# Patient Record
Sex: Female | Born: 1950
Health system: Southern US, Community
[De-identification: ages and names within clinical notes are randomized; demographics above are authoritative.]

## PROBLEM LIST (undated history)

## (undated) DIAGNOSIS — R590 Localized enlarged lymph nodes: Secondary | ICD-10-CM

## (undated) DIAGNOSIS — K760 Fatty (change of) liver, not elsewhere classified: Secondary | ICD-10-CM

## (undated) DIAGNOSIS — J449 Chronic obstructive pulmonary disease, unspecified: Secondary | ICD-10-CM

## (undated) DIAGNOSIS — R0789 Other chest pain: Secondary | ICD-10-CM

## (undated) DIAGNOSIS — IMO0002 Reserved for concepts with insufficient information to code with codable children: Secondary | ICD-10-CM

## (undated) DIAGNOSIS — Z87898 Personal history of other specified conditions: Secondary | ICD-10-CM

## (undated) DIAGNOSIS — G4733 Obstructive sleep apnea (adult) (pediatric): Secondary | ICD-10-CM

## (undated) DIAGNOSIS — Z86718 Personal history of other venous thrombosis and embolism: Secondary | ICD-10-CM

## (undated) DIAGNOSIS — C801 Malignant (primary) neoplasm, unspecified: Secondary | ICD-10-CM

## (undated) DIAGNOSIS — E039 Hypothyroidism, unspecified: Secondary | ICD-10-CM

## (undated) DIAGNOSIS — F32A Depression, unspecified: Secondary | ICD-10-CM

## (undated) DIAGNOSIS — Z8701 Personal history of pneumonia (recurrent): Secondary | ICD-10-CM

## (undated) DIAGNOSIS — M8580 Other specified disorders of bone density and structure, unspecified site: Secondary | ICD-10-CM

## (undated) DIAGNOSIS — M1990 Unspecified osteoarthritis, unspecified site: Secondary | ICD-10-CM

## (undated) DIAGNOSIS — F419 Anxiety disorder, unspecified: Secondary | ICD-10-CM

## (undated) DIAGNOSIS — E119 Type 2 diabetes mellitus without complications: Secondary | ICD-10-CM

## (undated) DIAGNOSIS — E669 Obesity, unspecified: Secondary | ICD-10-CM

## (undated) DIAGNOSIS — L03313 Cellulitis of chest wall: Secondary | ICD-10-CM

## (undated) DIAGNOSIS — I739 Peripheral vascular disease, unspecified: Secondary | ICD-10-CM

## (undated) DIAGNOSIS — Z720 Tobacco use: Secondary | ICD-10-CM

## (undated) DIAGNOSIS — G629 Polyneuropathy, unspecified: Secondary | ICD-10-CM

## (undated) DIAGNOSIS — Z8615 Personal history of latent tuberculosis infection: Secondary | ICD-10-CM

## (undated) DIAGNOSIS — Z1211 Encounter for screening for malignant neoplasm of colon: Secondary | ICD-10-CM

## (undated) DIAGNOSIS — M797 Fibromyalgia: Secondary | ICD-10-CM

## (undated) DIAGNOSIS — D179 Benign lipomatous neoplasm, unspecified: Secondary | ICD-10-CM

## (undated) DIAGNOSIS — E785 Hyperlipidemia, unspecified: Secondary | ICD-10-CM

## (undated) DIAGNOSIS — K1120 Sialoadenitis, unspecified: Secondary | ICD-10-CM

## (undated) DIAGNOSIS — I10 Essential (primary) hypertension: Secondary | ICD-10-CM

## (undated) DIAGNOSIS — E1142 Type 2 diabetes mellitus with diabetic polyneuropathy: Secondary | ICD-10-CM

## (undated) DIAGNOSIS — K219 Gastro-esophageal reflux disease without esophagitis: Secondary | ICD-10-CM

## (undated) DIAGNOSIS — J339 Nasal polyp, unspecified: Secondary | ICD-10-CM

## (undated) DIAGNOSIS — Z9989 Dependence on other enabling machines and devices: Secondary | ICD-10-CM

## (undated) DIAGNOSIS — F329 Major depressive disorder, single episode, unspecified: Secondary | ICD-10-CM

## (undated) DIAGNOSIS — Z853 Personal history of malignant neoplasm of breast: Secondary | ICD-10-CM

## (undated) HISTORY — DX: Anxiety disorder, unspecified: F41.9

## (undated) HISTORY — DX: Encounter for screening for malignant neoplasm of colon: Z12.11

## (undated) HISTORY — DX: Type 2 diabetes mellitus without complications: E11.9

## (undated) HISTORY — DX: Nasal polyp, unspecified: J33.9

## (undated) HISTORY — DX: Reserved for concepts with insufficient information to code with codable children: IMO0002

## (undated) HISTORY — DX: Dependence on other enabling machines and devices: Z99.89

## (undated) HISTORY — DX: Type 2 diabetes mellitus with diabetic polyneuropathy: E11.42

## (undated) HISTORY — DX: Personal history of other venous thrombosis and embolism: Z86.718

## (undated) HISTORY — DX: Essential (primary) hypertension: I10

## (undated) HISTORY — DX: Depression, unspecified: F32.A

## (undated) HISTORY — PX: PILONIDAL CYST EXCISION: SHX744

## (undated) HISTORY — DX: Obstructive sleep apnea (adult) (pediatric): G47.33

## (undated) HISTORY — DX: Personal history of latent tuberculosis infection: Z86.15

## (undated) HISTORY — PX: CARPAL TUNNEL RELEASE: SHX101

## (undated) HISTORY — DX: Hyperlipidemia, unspecified: E78.5

## (undated) HISTORY — DX: Obesity, unspecified: E66.9

## (undated) HISTORY — PX: THYROIDECTOMY, PARTIAL: SHX18

## (undated) HISTORY — DX: Major depressive disorder, single episode, unspecified: F32.9

## (undated) HISTORY — DX: Fibromyalgia: M79.7

## (undated) HISTORY — DX: Localized enlarged lymph nodes: R59.0

## (undated) HISTORY — DX: Hypothyroidism, unspecified: E03.9

## (undated) HISTORY — DX: Unspecified osteoarthritis, unspecified site: M19.90

## (undated) HISTORY — DX: Personal history of malignant neoplasm of breast: Z85.3

## (undated) HISTORY — DX: Tobacco use: Z72.0

## (undated) HISTORY — DX: Personal history of other specified conditions: Z87.898

## (undated) HISTORY — DX: Personal history of pneumonia (recurrent): Z87.01

## (undated) HISTORY — DX: Other specified disorders of bone density and structure, unspecified site: M85.80

## (undated) HISTORY — PX: TOTAL KNEE ARTHROPLASTY: SHX125

## (undated) HISTORY — DX: Benign lipomatous neoplasm, unspecified: D17.9

## (undated) HISTORY — DX: Other chest pain: R07.89

## (undated) HISTORY — DX: Fatty (change of) liver, not elsewhere classified: K76.0

## (undated) HISTORY — DX: Gastro-esophageal reflux disease without esophagitis: K21.9

## (undated) HISTORY — DX: Sialoadenitis, unspecified: K11.20

## (undated) HISTORY — DX: Polyneuropathy, unspecified: G62.9

---
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
## 1989-05-07 HISTORY — PX: ABDOMINAL HYSTERECTOMY: SHX81

## 1989-05-07 HISTORY — PX: APPENDECTOMY: SHX54

## 1997-08-23 ENCOUNTER — Other Ambulatory Visit: Admission: RE | Admit: 1997-08-23 | Discharge: 1997-08-23 | Payer: Self-pay | Admitting: Gynecology

## 1999-09-20 ENCOUNTER — Emergency Department (HOSPITAL_COMMUNITY): Admission: EM | Admit: 1999-09-20 | Discharge: 1999-09-20 | Payer: Self-pay | Admitting: Emergency Medicine

## 1999-09-20 ENCOUNTER — Encounter: Payer: Self-pay | Admitting: Emergency Medicine

## 2002-05-07 DIAGNOSIS — I739 Peripheral vascular disease, unspecified: Secondary | ICD-10-CM

## 2002-05-07 DIAGNOSIS — Z853 Personal history of malignant neoplasm of breast: Secondary | ICD-10-CM

## 2002-05-07 DIAGNOSIS — Z86718 Personal history of other venous thrombosis and embolism: Secondary | ICD-10-CM

## 2002-05-07 HISTORY — PX: BREAST SURGERY: SHX581

## 2002-05-07 HISTORY — DX: Personal history of malignant neoplasm of breast: Z85.3

## 2002-05-07 HISTORY — DX: Personal history of other venous thrombosis and embolism: Z86.718

## 2002-05-07 HISTORY — DX: Peripheral vascular disease, unspecified: I73.9

## 2002-05-07 HISTORY — PX: BREAST LUMPECTOMY: SHX2

## 2002-07-15 ENCOUNTER — Other Ambulatory Visit: Admission: RE | Admit: 2002-07-15 | Discharge: 2002-07-15 | Payer: Self-pay | Admitting: Diagnostic Radiology

## 2002-07-15 ENCOUNTER — Encounter (INDEPENDENT_AMBULATORY_CARE_PROVIDER_SITE_OTHER): Payer: Self-pay | Admitting: *Deleted

## 2002-07-15 ENCOUNTER — Encounter: Payer: Self-pay | Admitting: General Surgery

## 2002-07-15 ENCOUNTER — Encounter: Admission: RE | Admit: 2002-07-15 | Discharge: 2002-07-15 | Payer: Self-pay | Admitting: General Surgery

## 2002-07-21 ENCOUNTER — Encounter (HOSPITAL_COMMUNITY): Admission: RE | Admit: 2002-07-21 | Discharge: 2002-10-19 | Payer: Self-pay | Admitting: General Surgery

## 2002-07-21 ENCOUNTER — Encounter: Payer: Self-pay | Admitting: General Surgery

## 2002-07-22 ENCOUNTER — Encounter: Payer: Self-pay | Admitting: General Surgery

## 2002-07-23 ENCOUNTER — Encounter: Admission: RE | Admit: 2002-07-23 | Discharge: 2002-07-23 | Payer: Self-pay | Admitting: General Surgery

## 2002-07-23 ENCOUNTER — Encounter: Payer: Self-pay | Admitting: General Surgery

## 2002-07-23 ENCOUNTER — Encounter (INDEPENDENT_AMBULATORY_CARE_PROVIDER_SITE_OTHER): Payer: Self-pay | Admitting: *Deleted

## 2002-07-28 ENCOUNTER — Encounter: Payer: Self-pay | Admitting: General Surgery

## 2002-07-28 ENCOUNTER — Ambulatory Visit (HOSPITAL_BASED_OUTPATIENT_CLINIC_OR_DEPARTMENT_OTHER): Admission: RE | Admit: 2002-07-28 | Discharge: 2002-07-28 | Payer: Self-pay | Admitting: General Surgery

## 2002-07-28 ENCOUNTER — Encounter (INDEPENDENT_AMBULATORY_CARE_PROVIDER_SITE_OTHER): Payer: Self-pay | Admitting: *Deleted

## 2002-07-28 ENCOUNTER — Encounter: Admission: RE | Admit: 2002-07-28 | Discharge: 2002-07-28 | Payer: Self-pay | Admitting: General Surgery

## 2002-08-11 ENCOUNTER — Ambulatory Visit: Admission: RE | Admit: 2002-08-11 | Discharge: 2002-10-21 | Payer: Self-pay | Admitting: Radiation Oncology

## 2002-08-13 ENCOUNTER — Encounter: Payer: Self-pay | Admitting: Geriatric Medicine

## 2002-08-13 ENCOUNTER — Encounter: Admission: RE | Admit: 2002-08-13 | Discharge: 2002-08-13 | Payer: Self-pay | Admitting: Geriatric Medicine

## 2002-09-07 ENCOUNTER — Encounter: Admission: RE | Admit: 2002-09-07 | Discharge: 2002-09-07 | Payer: Self-pay | Admitting: Geriatric Medicine

## 2002-09-07 ENCOUNTER — Encounter: Payer: Self-pay | Admitting: Geriatric Medicine

## 2002-09-24 ENCOUNTER — Encounter: Payer: Self-pay | Admitting: Geriatric Medicine

## 2002-09-24 ENCOUNTER — Encounter: Admission: RE | Admit: 2002-09-24 | Discharge: 2002-09-24 | Payer: Self-pay | Admitting: Geriatric Medicine

## 2002-12-28 ENCOUNTER — Encounter: Admission: RE | Admit: 2002-12-28 | Discharge: 2002-12-28 | Payer: Self-pay | Admitting: Geriatric Medicine

## 2002-12-28 ENCOUNTER — Encounter: Payer: Self-pay | Admitting: Geriatric Medicine

## 2003-01-06 HISTORY — PX: BILATERAL OOPHORECTOMY: SHX1221

## 2003-01-14 ENCOUNTER — Encounter: Payer: Self-pay | Admitting: Oncology

## 2003-01-14 ENCOUNTER — Encounter: Admission: RE | Admit: 2003-01-14 | Discharge: 2003-01-14 | Payer: Self-pay | Admitting: Oncology

## 2003-01-21 ENCOUNTER — Encounter (HOSPITAL_COMMUNITY): Admission: RE | Admit: 2003-01-21 | Discharge: 2003-04-21 | Payer: Self-pay | Admitting: Oncology

## 2003-01-21 ENCOUNTER — Encounter: Payer: Self-pay | Admitting: Oncology

## 2003-01-22 ENCOUNTER — Encounter: Payer: Self-pay | Admitting: Oncology

## 2003-01-26 ENCOUNTER — Ambulatory Visit (HOSPITAL_COMMUNITY): Admission: RE | Admit: 2003-01-26 | Discharge: 2003-01-26 | Payer: Self-pay | Admitting: Geriatric Medicine

## 2003-02-18 ENCOUNTER — Encounter (INDEPENDENT_AMBULATORY_CARE_PROVIDER_SITE_OTHER): Payer: Self-pay

## 2003-02-18 ENCOUNTER — Ambulatory Visit (HOSPITAL_COMMUNITY): Admission: RE | Admit: 2003-02-18 | Discharge: 2003-02-18 | Payer: Self-pay | Admitting: Obstetrics and Gynecology

## 2003-03-22 ENCOUNTER — Ambulatory Visit (HOSPITAL_BASED_OUTPATIENT_CLINIC_OR_DEPARTMENT_OTHER): Admission: RE | Admit: 2003-03-22 | Discharge: 2003-03-22 | Payer: Self-pay | Admitting: Geriatric Medicine

## 2003-04-27 ENCOUNTER — Ambulatory Visit: Admission: RE | Admit: 2003-04-27 | Discharge: 2003-04-27 | Payer: Self-pay | Admitting: Radiation Oncology

## 2003-05-11 ENCOUNTER — Ambulatory Visit: Admission: RE | Admit: 2003-05-11 | Discharge: 2003-05-11 | Payer: Self-pay | Admitting: Radiation Oncology

## 2003-08-11 ENCOUNTER — Ambulatory Visit (HOSPITAL_COMMUNITY): Admission: RE | Admit: 2003-08-11 | Discharge: 2003-08-11 | Payer: Self-pay | Admitting: Geriatric Medicine

## 2004-01-03 ENCOUNTER — Encounter: Admission: RE | Admit: 2004-01-03 | Discharge: 2004-01-03 | Payer: Self-pay | Admitting: Geriatric Medicine

## 2004-01-19 ENCOUNTER — Encounter: Admission: RE | Admit: 2004-01-19 | Discharge: 2004-01-19 | Payer: Self-pay | Admitting: General Surgery

## 2004-01-26 ENCOUNTER — Encounter (HOSPITAL_COMMUNITY): Admission: RE | Admit: 2004-01-26 | Discharge: 2004-02-02 | Payer: Self-pay | Admitting: Oncology

## 2004-06-12 ENCOUNTER — Encounter: Admission: RE | Admit: 2004-06-12 | Discharge: 2004-06-12 | Payer: Self-pay | Admitting: Endocrinology

## 2004-07-14 ENCOUNTER — Ambulatory Visit: Payer: Self-pay | Admitting: Oncology

## 2004-08-29 ENCOUNTER — Ambulatory Visit: Payer: Self-pay | Admitting: Oncology

## 2004-11-14 ENCOUNTER — Ambulatory Visit: Payer: Self-pay | Admitting: Oncology

## 2005-01-24 ENCOUNTER — Ambulatory Visit (HOSPITAL_COMMUNITY): Admission: RE | Admit: 2005-01-24 | Discharge: 2005-01-25 | Payer: Self-pay | Admitting: Otolaryngology

## 2005-02-14 ENCOUNTER — Encounter: Admission: RE | Admit: 2005-02-14 | Discharge: 2005-02-14 | Payer: Self-pay | Admitting: General Surgery

## 2005-02-20 ENCOUNTER — Encounter: Admission: RE | Admit: 2005-02-20 | Discharge: 2005-02-20 | Payer: Self-pay | Admitting: Geriatric Medicine

## 2005-02-27 ENCOUNTER — Ambulatory Visit: Payer: Self-pay | Admitting: Oncology

## 2005-03-07 ENCOUNTER — Encounter: Admission: RE | Admit: 2005-03-07 | Discharge: 2005-03-07 | Payer: Self-pay | Admitting: General Surgery

## 2005-03-08 ENCOUNTER — Encounter: Admission: RE | Admit: 2005-03-08 | Discharge: 2005-03-08 | Payer: Self-pay | Admitting: Gastroenterology

## 2005-06-08 ENCOUNTER — Ambulatory Visit (HOSPITAL_COMMUNITY): Admission: RE | Admit: 2005-06-08 | Discharge: 2005-06-08 | Payer: Self-pay | Admitting: Orthopedic Surgery

## 2005-08-28 ENCOUNTER — Ambulatory Visit: Payer: Self-pay | Admitting: Oncology

## 2005-09-07 ENCOUNTER — Ambulatory Visit: Payer: Self-pay | Admitting: Internal Medicine

## 2005-09-18 ENCOUNTER — Ambulatory Visit: Payer: Self-pay | Admitting: Internal Medicine

## 2005-10-02 ENCOUNTER — Ambulatory Visit: Payer: Self-pay | Admitting: Internal Medicine

## 2005-10-15 ENCOUNTER — Ambulatory Visit: Payer: Self-pay | Admitting: Internal Medicine

## 2005-10-15 HISTORY — PX: COLONOSCOPY: SHX174

## 2006-03-18 ENCOUNTER — Encounter: Admission: RE | Admit: 2006-03-18 | Discharge: 2006-03-18 | Payer: Self-pay | Admitting: Family Medicine

## 2006-05-07 HISTORY — PX: NASAL SEPTUM SURGERY: SHX37

## 2006-05-08 ENCOUNTER — Ambulatory Visit: Payer: Self-pay | Admitting: Oncology

## 2006-06-04 LAB — COMPREHENSIVE METABOLIC PANEL
Alkaline Phosphatase: 123 U/L — ABNORMAL HIGH (ref 39–117)
BUN: 20 mg/dL (ref 6–23)
CO2: 31 mEq/L (ref 19–32)
Calcium: 9.9 mg/dL (ref 8.4–10.5)
Chloride: 98 mEq/L (ref 96–112)
Glucose, Bld: 90 mg/dL (ref 70–99)
Total Bilirubin: 0.5 mg/dL (ref 0.3–1.2)

## 2006-06-04 LAB — CBC WITH DIFFERENTIAL/PLATELET
BASO%: 0.3 % (ref 0.0–2.0)
Basophils Absolute: 0 10*3/uL (ref 0.0–0.1)
EOS%: 1.5 % (ref 0.0–7.0)
HGB: 14 g/dL (ref 11.6–15.9)
LYMPH%: 34.8 % (ref 14.0–48.0)
MCHC: 34.3 g/dL (ref 32.0–36.0)
MONO#: 0.4 10*3/uL (ref 0.1–0.9)
MONO%: 7.7 % (ref 0.0–13.0)
NEUT%: 55.7 % (ref 39.6–76.8)
WBC: 5.5 10*3/uL (ref 3.9–10.0)

## 2006-06-25 ENCOUNTER — Encounter: Admission: RE | Admit: 2006-06-25 | Discharge: 2006-06-25 | Payer: Self-pay | Admitting: Family Medicine

## 2006-07-04 ENCOUNTER — Inpatient Hospital Stay (HOSPITAL_COMMUNITY): Admission: RE | Admit: 2006-07-04 | Discharge: 2006-07-07 | Payer: Self-pay | Admitting: Orthopedic Surgery

## 2006-09-03 ENCOUNTER — Ambulatory Visit (HOSPITAL_COMMUNITY): Admission: RE | Admit: 2006-09-03 | Discharge: 2006-09-03 | Payer: Self-pay | Admitting: Emergency Medicine

## 2006-09-03 ENCOUNTER — Ambulatory Visit: Payer: Self-pay | Admitting: Vascular Surgery

## 2006-09-03 ENCOUNTER — Emergency Department (HOSPITAL_COMMUNITY): Admission: EM | Admit: 2006-09-03 | Discharge: 2006-09-03 | Payer: Self-pay | Admitting: Emergency Medicine

## 2006-09-03 ENCOUNTER — Encounter: Payer: Self-pay | Admitting: Vascular Surgery

## 2006-12-16 ENCOUNTER — Ambulatory Visit: Payer: Self-pay | Admitting: Oncology

## 2007-03-28 ENCOUNTER — Encounter: Admission: RE | Admit: 2007-03-28 | Discharge: 2007-03-28 | Payer: Self-pay | Admitting: Oncology

## 2007-04-21 ENCOUNTER — Encounter: Admission: RE | Admit: 2007-04-21 | Discharge: 2007-04-21 | Payer: Self-pay | Admitting: Oncology

## 2007-05-29 ENCOUNTER — Ambulatory Visit: Payer: Self-pay | Admitting: Oncology

## 2008-04-21 ENCOUNTER — Encounter: Admission: RE | Admit: 2008-04-21 | Discharge: 2008-04-21 | Payer: Self-pay | Admitting: Family Medicine

## 2008-05-12 ENCOUNTER — Ambulatory Visit (HOSPITAL_COMMUNITY): Admission: RE | Admit: 2008-05-12 | Discharge: 2008-05-12 | Payer: Self-pay | Admitting: Otolaryngology

## 2008-05-13 ENCOUNTER — Ambulatory Visit: Payer: Self-pay | Admitting: Internal Medicine

## 2008-05-25 ENCOUNTER — Ambulatory Visit: Payer: Self-pay | Admitting: Internal Medicine

## 2008-05-25 ENCOUNTER — Encounter: Payer: Self-pay | Admitting: Internal Medicine

## 2008-05-25 ENCOUNTER — Ambulatory Visit: Payer: Self-pay

## 2008-05-25 HISTORY — PX: TRANSTHORACIC ECHOCARDIOGRAM: SHX275

## 2008-05-25 LAB — CONVERTED CEMR LAB
AST: 20 units/L (ref 0–37)
Cholesterol: 157 mg/dL (ref 0–200)
Free T4: 0.9 ng/dL (ref 0.6–1.6)
HDL: 30 mg/dL — ABNORMAL LOW (ref >39.0)
TSH: 1.03 microintl units/mL (ref 0.35–5.50)
Total Protein: 6.7 g/dL (ref 6.0–8.3)

## 2008-06-24 ENCOUNTER — Ambulatory Visit: Payer: Self-pay | Admitting: Internal Medicine

## 2008-06-24 ENCOUNTER — Ambulatory Visit: Payer: Self-pay

## 2009-09-06 ENCOUNTER — Encounter: Admission: RE | Admit: 2009-09-06 | Discharge: 2009-09-06 | Payer: Self-pay | Admitting: Family Medicine

## 2010-09-19 NOTE — Letter (Signed)
May 13, 2008    Tammy R. Collins Scotland, MD  [address], [place] [address]   RE:  Susan [name], Susan [name]  MRN:  [id number]  /  DOB:  01-03-1951   Dear Dr. Collins Scotland:   It was my pleasure to see your patient Sahvannah Rieser in Cardiology  consultation today regarding her palpitations and increased heart rate.  As you recall, Ms. Bains is a very pleasant 60 year old female with  numerous long-standing medical problems including obesity, hypertension,  hyperlipidemia, glucose intolerance, and obstructive sleep apnea.  She  lives a relatively sedentary lifestyle, but denies exertional chest  pain.  She does report shortness of breath with moderate activities.  She also reports occasional palpitations lasting several seconds.  These  episodes are rare and occurred only 4-5 times over the past 4 months.  She also notes that her heart rate is frequently above 100 beats per  minute, this has caused her some anxiety.  She notes that at night when  lying supine that she developed a fullness in her chest as well as  palpitations.  She associates this with upper respiratory congestion,  but it is concerned that it might be cardiac in origin.  She also  reports occasional sharp fleeting chest pains.  She denies orthopnea,  PND, presyncope or syncope.  The patient wears a CPAP for long-standing  sleep apnea, but her husband notes that she continues to snore despite  her CPAP.  She reports having her initial CPAP settings placed  approximately 5 years ago, but has gained significant weight since that  time. She also continues to smoke near one pack per day.  She is,  otherwise, without complaint today.   PAST MEDICAL HISTORY:  1. Obstructive sleep apnea, currently compliant with CPAP and      diagnosed approximately 5 years ago.  2. Glucose intolerance.  3. Morbid obesity.  4. Hypertension.  5. Hyperlipidemia.  6. Ongoing tobacco use.  7. Depression.  8. Anxiety.  9. Fibromyalgia.  10.Degenerative degenerative joint disease.  11.Gastroesophageal reflux disease.  12.Vertigo.  13.History of breast cancer (ductal carcinoma in situ) diagnosed in      2004 status post lumpectomy and XRT.  14.Left arm DVT developed in 2004 while on tamoxifen.  She has      completed a Coumadin course for this.  15.Status post right total knee arthroplasty.  16.Status post total abdominal hysterectomy in 1991.  17.Bilateral salpingo-oophorectomy in 2004.  18.Partial thyroidectomy in 1981.  19.Status post carpal tunnel release.  20.Status post septoplasty.  21.History of remote adrenal insufficiency, which is thought to have      resolved.  22.Polypharmacy.  23.Pituitary microadenoma.  24.Fatty liver disease.  25.Irritable bowel syndrome.   CURRENT MEDICATIONS:  1. Toprol-XL 25 mg b.i.d.  2. Hydrochlorothiazide 25 mg daily.  3. Effexor XR 150 mg daily.  4. Misoprostol 100 mcg b.i.d.  5. Diclofenac 75 mg b.i.d.  6. Lasix 20 mg daily.  7. Calcium plus vitamin D 600 mg t.i.d.  8. Multivitamin daily.  9. Flaxseed oil daily.  10.Ibuprofen, Flexeril, temazepam, Xanax p.r.n.   ALLERGIES:  The patient has multiple allergies including reactions to  PENICILLIN, SULFA, CECLOR, ERYTHROMYCIN and LYRICA.   SOCIAL HISTORY:  [place]  [place]ouse.  She has a long-standing history of tobacco and is currently  trying to quit.  She denies alcohol or drug use.  The patient worked as  an Charity fundraiser for 5 years.  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   FAMILY HISTORY:  Notable for the patient's father died at age 42 of lung  cancer and her mother died at age 32 of complications of hepatitis C  cirrhosis.  She has 8 siblings, who are alive and healthy.   REVIEW OF SYSTEMS:  All systems are reviewed and negative, except as  outlined in the HPI above.   PHYSICAL EXAMINATION:  VITAL  SIGNS:  Blood pressure 144/88, heart rate  77, respirations 18, and weight 236 pounds.  GENERAL:  The patient is an obese female in no acute distress.  She is  anxious appearing.  HEENT:  Normocephalic and atraumatic.  Sclerae are clear.  Conjunctivae  pink.  Oropharynx clear.  NECK:  Supple.  No JVD, thyromegaly, or bruits.  LUNGS:  Clear to auscultation bilaterally.  HEART:  Regular rate and rhythm.  No murmurs, rubs, or gallops.  GI:  Soft, nontender, and nondistended.  Positive bowel sounds.  EXTREMITIES:  No clubbing, cyanosis, or edema.  NEUROLOGIC:  Cranial nerves II-XII are intact.  Strength and sensation  are intact.  SKIN:  No ecchymosis or lacerations.  MUSCULOSKELETAL:  No deformity or atrophy.  PSYCH:  Euthymic mood.  Flat affect.   EKG reveals sinus rhythm at 77 beats per minute, otherwise unremarkable.   IMPRESSION:  Ms. Badon is a 60 year old female with a host of medical  issues including morbid obesity, hypertension, glucose intolerance,  obstructive sleep apnea, and ongoing tobacco use.  She presents today  for further consultation regarding palpitations, tachycardia, decreased  exercise capacity, and atypical chest pain.  I think the patient's  clinical presentation is relatively benign.  I do see that she is  accumulating multiple risk factors for coronary arterial disease.  Several of these factors are modifiable.  I believe that she is smoking  cessation should be attempted.  I have discussed this in detail with the  patient today.  I also think that lifestyle modification including  regular exercise and weight loss is necessary.  Hopefully, this will  improve her blood pressure, hyperlipidemia, and glucose intolerance.  The patient's palpitations seem to be quite infrequent and benign.  I  therefore do not think that further Holter or event monitoring is  necessary at this time.  Though her shortness of breath and chest  discomfort appear to be atypical,  again she does have multiple risks  factors for coronary artery disease and therefore, I think that further  stratification with a graded exercise test and also an echocardiogram to  evaluate for structural heart disease are necessary.  As the patient  appears to have ongoing sleep apnea despite continuous positive airway  pressure therapy, I think that we should repeat a sleep study to  evaluate for effectiveness of her current continuous positive airway  pressure settings.  She has gained significant weight since her initial  sleep study and likely we need to adjustment of her continuous positive  airway pressure.  I suspect that some of the patient's tachycardia maybe  due to a mild dehydration as she is on both hydrochlorothiazide and  Lasix, and does not appear to be clinically hypervolemic at this time.   PLAN:  1. Lasix is discontinued.  2. We will obtain a sleep study.  I will refer the patient to      Pulmonary for assistance with management of her CPAP settings.  3. We  will obtain an echocardiogram to evaluate for structural heart      disease.  4. We will obtain a GXT for further risk stratification for coronary      artery disease.  5. We will obtain a thyroid profile as well as fasting lipids.  6. Smoking cessation and lifestyle modification were discussed at      length today.  7. I will not make changes to the patient's hypertensive regimen at      this time, though I have encouraged her to lose weight which should      improve her blood pressure.  8. The patient will follow up with me in clinic in 4 weeks to discuss      the results of the above.   Thank you for the opportunity of participating in the care of Ms.  Beedy.  Please feel free to contact me if you wish to discuss her  care further.    Sincerely,      Hillis Range, MD  Electronically Signed    JA/MedQ  DD: 05/13/2008  DT: 05/14/2008  Job #: [id number]

## 2010-09-19 NOTE — Letter (Signed)
June 24, 2008    Tammy R. Collins Scotland, MD  [address], [place] [address]   RE:  [name], [name]  MRN:  [id number]  /  DOB:  [birth year]   Dear Dr. Collins Scotland,   I am writing in followup regarding Susan [name].  As you recall, she  is a pleasant 60 year old female with obesity, hypertension,  hyperlipidemia, glucose intolerance, and obstructive sleep apnea.  I saw  her on May 13, 2008, for consultation regarding atypical chest pain  and palpitations.  He was felt that her palpitations were benign at that  time.  A transthoracic echocardiogram was performed to rule out  structural heart disease and revealed a preserved ejection fraction with  no significant valvular abnormalities.  She returned today and underwent  an exercise stress test.  She was noted to have a brisk increase in  heart rate and reached her target heart rate within 2 minutes of  exercise.  She was however able to exercise for total of 6 minutes and  50 seconds and achieved  8.2 mets without any significant EKG changes.  No arrhythmias were observed during the study.  She did not have chest  pain and discontinue the study due to fatigue.  I believe that she is  poorly conditioned and morbidly obese, but otherwise has no findings to  suggest ischemia at this time.  I have stressed the importance of  lifestyle modifications with the patient including smoking cessation,  daily exercise, and weight loss.  I am concerned that given her  hypertension, hyperlipidemia, and glucose intolerance that is if she  continues to smoke and continue her present lifestyle that difficulties  may lie ahead for her.  I have made no medication changes today.  I did  take the liberty of obtaining a lipid profile, which revealed a total  cholesterol 157, triglycerides 193, HDL 30 and LDL of 88.  I have  encouraged exercise and diet control, but suspect that she will likely  required niacin for her elevated triglycerides and low  HDL.  I will  defer initiation of this medication to you.  She has recently started  Wellbutrin and again I have stressed the importance of smoking  cessation.  She reports that she continues to snore despite CPAP therapy  and might benefit from up titration of her CPAP.  I have recommended  that she contact her Pulmonologist for this.  Her blood pressure was  elevated in clinic today.  However, she did not take her  antihypertensive in anticipation of her stress test.  I therefore have  made no medication changes for her blood pressure.  I appreciate the  opportunity in participating in the care of Susan [name].  I will return  her care in its entirety back to you.  The patient is aware that she may  contact my office at any time, should any further cardiovascular  problems arise.  Please feel free to contact me if I can be of further  assistance.    Sincerely,      Hillis Range, MD  Electronically Signed    JA/MedQ  DD: 06/24/2008  DT: 06/24/2008  Job #: [id number]

## 2010-09-22 NOTE — Op Note (Signed)
NAME:  Susan [name], Susan [name]                       ACCOUNT NO.:  [id number]   MEDICAL RECORD NO.:  [id number]                   PATIENT TYPE:  AMB   LOCATION:  SDC                                  FACILITY:  [facility]   PHYSICIAN:  Dineen Kid. Rana Snare, M.D.                 DATE OF BIRTH:  Jan 02, 1951   DATE OF PROCEDURE:  02/18/2003  DATE OF DISCHARGE:                                 OPERATIVE REPORT   PREOPERATIVE DIAGNOSES:  1. Pelvic pain.  2. Right ovarian cyst.  3. History of breast cancer.  4. Left labial inclusion cyst.   POSTOPERATIVE DIAGNOSES:  1. Pelvic pain.  2. Right ovarian cyst.  3. History of breast cancer.  4. Left labial inclusion cyst.  5. Pelvic adhesions.   PROCEDURES:  1. Laparoscopy with bilateral salpingo-oophorectomy and lysis of adhesions.  2. Excision of left labial cyst.   SURGEON:  Dineen Kid. Rana Snare, M.D.   ANESTHESIA:  General endotracheal.   INDICATIONS:  Susan [name] is a 60 year old G5, P3, A2, who has been having  off-and-on pelvic pain on the right side that radiates through her back and  hip.  She has had an ovarian cyst on and off for the last several years,  previously been on Coumadin for DVT while she was on tamoxifen.  She does  have a history of breast cancer.  Recently had stopped Coumadin and was  having back pain, and a CT scan was performed showing right ovarian cyst  again, which is felt to be causing pain.  She desired definitive surgical  evaluation and treatment and requests bilateral salpingo-oophorectomy.  She  also has a left labial inclusion cyst that she would like excised at the  same time.  Risks and benefits were discussed at length.  Informed consent  was obtained.  The risks do include but not limited to risk of bleeding,  infection, damage to ureters, bowel, bladder, risk of anesthesia, risks  associated with blood transfusion.  She also understands that if this is  cancer, that a formal staging procedure would be carried out.   She also  understands that this may or may not alleviate the pelvic pain.  She gives  her informed consent.   FINDINGS AT THE TIME OF SURGERY:  A left labial inclusion cyst on the labia  majora.  Intra-abdominally she does have a right ovarian cyst, which is  consistent with a serous cyst.  No evidence of ascites or evidence of  metastatic disease.  The left tube and ovary are densely adhesed below the  descending colon and to the pelvic sidewall.   DESCRIPTION OF PROCEDURE:  After adequate analgesia, the patient placed in  the dorsal lithotomy position.  She was sterilely prepped and draped.  The  bladder was sterilely drained.  The left labial cyst was grasped with Adsons  and sharply excised with a #10 blade.  The skin was then  closed with a  horizontal mattress 4-0 Vicryl Rapide and noted to be hemostatic.  A 1 cm  infraumbilical skin incision was made and a Veress needle was inserted.  The  abdomen was insufflated with dullness to percussion.  An 11 mm trocar was  inserted and the above findings were noted.  A 5 mm trocar was inserted to  the left of the midline two fingerbreadths above the pubic symphysis under  direct visualization.  The right ovary and fallopian tube were grasped with  atraumatic graspers.  A Gyrus tripolar was used to coagulate and cut along  the infundibulopelvic ligament down to the inferior portion of the broad  ligament, and the right ovary and tube were excised with good hemostasis  achieved, and care was taken to avoid the underlying ureter.  The left ovary  and fallopian tube were sharply dissected using Endoshears and the Gyrus  ligator, dissecting the bowel from the tubo-ovarian complex.  The left ovary  was identified and the infundibulopelvic ligament was coagulated and  dissected with the Gyrus ligator, with care taken to avoid the underlying  bowel and ureter, and hemostasis was achieved.  The left fallopian tube was  then sharply dissected from the  bowel and the pelvic sidewall using  Endoshears and also bipolar cautery.  After a careful examination of the  bowel and the pelvic sidewall with no evidence of trauma to the underlying  tissues, the ovaries and fallopian tubes were morcellated and removed  through the trocar.  After careful re-examination, everything appeared to be  hemostatic.  The trocars were removed.  The abdomen was desufflated.  The  infraumbilical skin incision was closed with 0 Vicryl in the fascia,  interrupted suture of 4-0 Vicryl Rapide subcuticular stitch, and the 5 mm  site was closed with 4-0 Vicryl Rapide subcuticular stitch.  The incisions  were injected with 0.25% Marcaine 10 mL used.  The patient was stable on  transfer to the recovery room.  Sponge and instrument count was normal x3.  The patient received 400 mg of Cipro IV preoperatively and 30 mg of Toradol  IV postoperatively.   DISPOSITION:  The patient will be discharged home to follow up in the office  in two to three weeks and has a routine instruction sheet for laparoscopy.  Told to return for any increased pain, fever, or bleeding, and she was also  given a prescription for Vicodin, #30.                                               Dineen Kid Rana Snare, M.D.    DCL/MEDQ  D:  02/18/2003  T:  02/18/2003  Job:  [id number]

## 2010-09-22 NOTE — Op Note (Signed)
NAMEJAKIA, Susan [name]             ACCOUNT NO.:  [id number]   MEDICAL RECORD NO.:  [id number]          PATIENT TYPE:  AMB   LOCATION:  DAY                          FACILITY[facility]  [facility]   PHYSICIAN:  Almedia Balls. Ranell Patrick, M.D. DATE OF BIRTH:  [birth year]   DATE OF PROCEDURE:  06/08/2005  DATE OF DISCHARGE:                                 OPERATIVE REPORT   PREOPERATIVE DIAGNOSIS:  Right hand carpal tunnel syndrome.   POSTOPERATIVE DIAGNOSIS:  Right hand carpal tunnel syndrome.   PROCEDURE PERFORMED:  Right carpal tunnel release, performed under MAC and  local block.   SURGEON:  Almedia Balls. Ranell Patrick, M.D.   ASSISTANT:  None.   FLUIDS REPLACED:  Crystalloid 500 cc.   TOURNIQUET TIME:  Eighteen minutes at 275 mmHg.   INSTRUMENT COUNT:  Correct.   COMPLICATIONS:  None.   PREOPERATIVE ANTIBIOTICS:  Given.   INDICATIONS:  Patient is a 60 year old female with a history of worsening  right carpal tunnel syndrome.  EMG nerve conduction is positive for severe  carpal tunnel.  Patient elects to proceed with surgical management to  relieve pressure on the nerve.  Informed consent was obtained.   DESCRIPTION OF PROCEDURE:  After an adequate level of anesthesia was  achieved, the patient was positioned supine on the operating room table.  A  tourniquet was placed on the right proximal arm.  The right arm was  sterilely prepped and draped in the usual manner.  A longitudinal skin  incision in the palm after exsanguination of the limb and elevation of the  tourniquet to 275 mmHg.  Loupe magnification was utilized.  We dissected  carefully down through the subcutaneous tissues to the superficial palmar  fascia, incising that in line with the skin incision, which was in line with  the third and fourth ray.  We then divided the transverse carpal ligament in  its entirety from the mid intercarpal space into the superficial forearm  fascia.  The nerve was extensively compressed up against the radial side  of  the tunnel.  We went ahead and performed a neurolysis of the nerve as well  as a synovectomy, which gave  the extra space for the nerve and the carpal canal.  We went ahead and  irrigated the wound thoroughly and closed it, using an interrupted nylon  suture, followed by a short arm splint.  The patient tolerated the surgery  well and was taken to the recovery room.           ______________________________  Almedia Balls Ranell Patrick, M.D.     SRN/MEDQ  D:  06/08/2005  T:  06/08/2005  Job:  [id number]

## 2010-09-22 NOTE — H&P (Signed)
Susan [name], Susan [name]             ACCOUNT NO.:  [id number]   MEDICAL RECORD NO.:  [id number]          PATIENT TYPE:  INP   LOCATION:  NA                           [facility]ACILITY:  [facility]   PHYSICIAN:  Madlyn Frankel. Charlann Boxer, M.D.  DATE OF BIRTH:  December 18, 1950   DATE OF ADMISSION:  07/04/2006  DATE OF DISCHARGE:                              HISTORY & PHYSICAL   PROCEDURE:  Right total knee arthroplasty.   CHIEF COMPLAINT:  Right knee pain.   HISTORY OF PRESENT ILLNESS:  This is a very pleasant 60 year old female  with a history of persistent progressive right knee pain.  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 She also has severe degenerative changes of the  patellofemoral compartment. All conservative measures have been  refractory to treatment.  Her quality of life has been diminished.  She  has failed all conservative measures.  She had been scheduled for a  right total knee replacement.   PAST MEDICAL HISTORY:  Significant for:  1. Migraines.  2. Anxiety/depression.  3. General anxiety disorder.  4. Bronchitis.  5. Hypertension.  6. Hiatal hernia.  7. Reflux disease.  8. Fatty liver disease.  9. IBS.  10.Hyperthyroidism.  11.History of DCIS with a left lumpectomy in March 2004.  12.History of DVTs.  13.Osteoarthritis.  14.Degenerative disk disease lower spine.  15.Fibromyalgia.  16.Sleep apnea. Uses a CPAP.  17.Fibrocystic breast disease.   PAST SURGICAL HISTORY:  1. Carpal tunnel release, right hand, 2007.  2. Nasal septoplasty in 2006.  3. Bilateral oophorectomy in 2004.  4. Left partial mastectomy 2004.  5. Hysterectomy in 1991.  6. Tubal ligation in 1986.  7. Right thyroidectomy in 1985.  8. Pilonidal cyst removal in 1973.   FAMILY MEDICAL HISTORY:  Heart disease, hypertension, cancer, arthritis.   DRUG ALLERGIES:  1. PENICILLIN severe rash.  2. SULFA DRUGS.  3. CECLOR, ERYTHROMYCIN, and LYRICA on high  doses.   MEDICATIONS:  1. Ultram 50 mg 1 p.o. q.h.s.  2. Librax 1 tablet t.i.d. p.r.n.  3. Toprol XL 1 p.o. 50 mg daily.  4. Nexium 40 mg 1 p.o. b.i.d.  5. Furosemide 20 mg 1 p.o. daily.  6. Arthrotec 75 one tablet p.o. b.i.d.  7. Restoril 15 mg 1 p.o. q.h.s.  8. Venlafaxine HCl XR 150 mg 1 p.o. daily.  9. Flexeril 10 mg 1 p.o. q.h.s. p.r.n.  10.HCTZ 25 mg 1 p.o. daily.  11.Amitriptyline 50 mg 1 p.o. q.h.s.  12.GlycoLax 1 capful in glass daily.  13.Glucosamine chondroitin 2 tablets q.a.m. and 1 q.h.s.  14.Calcium 600 plus vitamin D 1 tablet p.o. b.i.d.  15.Multivitamin.   REVIEW OF SYSTEMS:  She has had some chest congestion in the past 2  weeks with mild production.  Otherwise she has a normal HPI. See history  of present illness.   PHYSICAL EXAM:  VITAL SIGNS:  Pulse 84, respirations 18, blood pressure  138/84.  GENERAL:  She is awake, alert and oriented, well-developed, well-  nourished in no acute distress.  NECK:  Supple.  No carotid bruits.  CHEST:  Lungs are clear to auscultation bilaterally.  BREASTS:  Deferred.  HEART:  Regular rate and rhythm without gallops, clicks, rubs or  murmurs.  ABDOMEN:  Soft, obese, nontender, nondistended.  Bowel sounds present.  GENITOURINARY:  Deferred.  EXTREMITIES:  She has parapatellar tenderness and medial joint line  tenderness.  No flexion contracture noted.  SKIN:  No cellulitis.  Dorsalis pedis pulse positive.  NEUROLOGIC:  She has intact distal sensibilities.   LABS:  Pending [facility] appointment on July 02, 2006.   X-RAYS:  Chest x-rays are pending the [facility] presurgical clearance.  All radiographs of AP standing as well as lateral right knee reviewed.   IMPRESSION:  Right knee degenerative changes.   PLAN OF ACTION:  Right total knee arthroplasty July 04, 2006 at  12:30 p.m. by Dr. Durene Romans. The risks and complications were  discussed. Questions were encouraged, answered and reviewed.      ______________________________  Yetta Glassman [address], [place]      Madlyn Frankel. Charlann Boxer, M.D.  Electronically Signed    BLM/MEDQ  D:  06/28/2006  T:  06/28/2006  Job:  [id number]   cc:   Tammy R. Collins Scotland, M.D.  Fax: [phone]

## 2010-09-22 NOTE — Discharge Summary (Signed)
NAMEAMORAH, SEBRING             ACCOUNT NO.:  [id number]   MEDICAL RECORD NO.:  [id number]          PATIENT TYPE:  INP   LOCATION:  1516                         FACILITY:  [facility]   PHYSICIAN:  Madlyn Frankel. Charlann Boxer, M.D.  DATE OF BIRTH:  Apr 30, 1951   DATE OF ADMISSION:  07/04/2006  DATE OF DISCHARGE:  07/07/2006                               DISCHARGE SUMMARY   ADMITTING DIAGNOSES:  1. Osteoarthritis.  2. Hypertension.  3. Sleep apnea.  4. Reflux disease.  5. Hiatal hernia.  6. Fatty liver disease.  7. Irritable bowel syndrome.  8. Hypothyroidism.  9. History of deep vein thromboses.  10.Fibromyalgia.   DISCHARGE DIAGNOSIS:  1. Osteoarthritis.  2. Hypertension.  3. Sleep apnea.  4. Hiatal hernia.  5. Reflux disease.  6. Fatty liver disease.  7. Irritable bowel syndrome.  8. Hypothyroidism.  9. History of deep vein thromboses.  10.Fibromyalgia.   CONSULTS:  None.   PROCEDURE:  Right total knee replacement.  Surgeon - Dr. Durene Romans.  Assistant - Dwyane Luo, PA-C.   LABORATORIES:  1. CBC - hemoglobin and hematocrit 12.4 and 35.7.  On postoperative      day #1, 11.4 and 32.5.  On postoperative day #2, 9.6 and 27.4.  2. Preadmission coags with INR 1.1.  3. Preadmission chemistries - sodium 142, potassium 3.2, glucose 145.      Postoperative day #1, sodium 138, potassium 3.6, glucose 130.  On      postoperative day #2, sodium 138, potassium 3.8, glucose 148.  4. GI workup preadmission - ALT 62.  5. Preadmission urinalysis negative.  6. EKG - normal sinus rhythm.  7. Chest two-view on December 14, 2005 - no acute process.   HOSPITAL COURSE:  The patient underwent right knee arthroplasty by Dr.  Charlann Boxer.  She tolerated the procedure well and was admitted to the  orthopedic floor.  Her pain was well-controlled throughout her course of  stay.  She remained neuromuscularly and vascularly intact throughout.  Her dressing was checked.  On first day, there was no active draining.  She was unable to do a straight leg raise on the first postoperative  day.  We did start PT, OT and weightbearing as tolerated.  The patient  was very motivated to get better.  PT evaluation was done.  [facility] per recommendations.  Occupational therapy was  also evaluated.  Recommendations were for a physical therapy rolling  walker and a 3-in-1 commode.  These were delivered to the patient's room  on postoperative day #1.  On postoperative day #2, the patient was  feeling good in a moderate amount of pain.  She was hemodynamically  stable.  She had done well with her physical therapy and was able to get  up and ambulate with no assistance.  Her IV was put to [facility].  On  postoperative  day #3, the patient was doing very, very well.  There was  some mild drainage on the dressing.  It was changed.  There was some  mild drainage on the incision, but otherwise no signs of infection.  Her  calf was soft and nontender, and she was neuromuscularly and vascularly  intact.  Plan was to discharge her home after the third day.  She was  hemodynamically and orthopedically stable.   DISCHARGE DISPOSITION:  Discharged home in stable and improved condition  with home health care physical therapy, rolling walker and 3-in-1  commode recommended.   DISCHARGE DIET:  No restrictions.   DISCHARGE WOUND CARE:  Keep wound dry.  Change dressing daily.  May  shower; however, cover with impervious surface, taping edges.  Dry  dressing afterwards.   DISCHARGE PHYSICAL THERAPY:  The patient is weightbearing as tolerated  with the use of a rolling walker with hopeful progression to a single  point cane after 2 weeks.  Goals of physical therapy to work on gait  retraining, proprioception.  Want to minimize pain, maximize range of  motion and increase strength and encourage independence in activities of  daily living.   DISCHARGE MEDICATIONS:  1. Klor-Con 20 mEq one p.o. q.a.m.  2.  Metoprolol XL 50 mg half p.o. q.a.m.  3. Calcium plus vitamin D 600 one p.o. b.i.d.  4. Nexium 40 mg one p.o. b.i.d.  5. Lasix 20 mg one p.o. q.a.m.  6. Effexor XR 150 mg one p.o. q.a.m.  7. HydroDIURIL 25 mg one p.o. q.a.m.  8. Elavil 50 mg one p.o. q.h.s.  9. Glycolax one capful one p.o. q. every other day.  10.Restoril 50 mg one p.o. q.h.s.  11.Mucinex over-the-counter one p.o. b.i.d.  12.Lovenox 30 mg one subcutaneous q.12h. x11 days for DVT prophylaxis.  13.Vicodin 5/325 one to two p.o. q.4-6h. p.r.n. pain.  14.Robaxin 500 mg one p.o. q.6h. p.r.n. muscle spasm.  15.Celebrex 200 mg one p.o. daily.  16.Ferrous sulfate 325 mg one p.o. t.i.d. x3 weeks.  17.Enteric-coated aspirin 325 mg one p.o. daily x4 weeks after Lovenox      is completed.   DISCHARGE SPECIAL INSTRUCTIONS:  1. Apply ice to knee 20 minutes per hour as needed.  2. Follow up with Dr. Charlann Boxer, [phone], in 2 weeks for wound check.  3. If develop any acute shortness of breath or severe calf pain,      please call emergency service immediately.     ______________________________  Susan [name] [address], [place]      Madlyn Frankel. Charlann Boxer, M.D.  Electronically Signed    BLM/MEDQ  D:  07/30/2006  T:  07/30/2006  Job:  [id number]

## 2010-09-22 NOTE — Op Note (Signed)
[name], [name]             ACCOUNT NO.:  [id number]   MEDICAL RECORD NO.:  [id number]          PATIENT TYPE:  OIB   LOCATION:  2550                         FACILITY:  [facility]   PHYSICIAN:  Zola Button T. Lazarus Salines, M.D. DATE OF BIRTH:  12-20-1950   DATE OF PROCEDURE:  01/24/2005  DATE OF DISCHARGE:                                 OPERATIVE REPORT   PREOPERATIVE DIAGNOSES:  1.  Nasal septal deviation.  2.  Hypertrophic inferior turbinates with obstruction.  3.  Anterior septal perforation.   POSTOPERATIVE DIAGNOSES:  1.  Nasal septal deviation.  2.  Hypertrophic inferior turbinates with obstruction.  3.  Anterior septal perforation.   PROCEDURE PERFORMED:  1.  Nasal septoplasty.  2.  Bilateral submucous resection, inferior turbinates.  3.  Repair of anterior septal perforation   SURGEON:  Karol T. Lazarus Salines, M.D.   ANESTHESIA:  General orotracheal.   BLOOD LOSS:  Minimal.   COMPLICATIONS:  None.   FINDINGS:  A relatively fleshy external nose with a thick septum  significantly deviated towards the right and concave towards the left.  A  roughly 1 x 2.5-cm thick-edged inferior anterior septal perforation with a 1-  cm remaining caudal strut.  The septal cartilage was relatively soft and  flimsy throughout.  Large inferior turbinates.  No polyps or active  drainage.   PROCEDURE:  With the patient in the comfortable supine position, general  orotracheal anesthesia was induced without difficulty.  At an appropriate  level, the patient was placed in a slight sitting position.  A saline-  moistened throat pack was placed.  Nasal vibrissae were trimmed.  Cocaine  crystals, 200 mg total, were applied on anterior ethmoid and the  sphenopalatine ganglion cotton carriers.  4% cocaine solution, 160 mg total,  were applied on 1/2 x 3-inch cottonoids to both sides of the septal mucosa.  Finally, 1% Xylocaine with 1:100,000 epinephrine, 20 mL total, was  infiltrated into the anterior  floor the nose, into the anterior pole of the  inferior turbinates, and into the submucoperichondrial plane of the septum  on both sides.  Several minutes were allowed for hemostasis and anesthesia  to take effect.  A sterile preparation and draping of the midface was  accomplished.   Materials were removed from the nose and observed to be intact and correct  in number.  The findings were as described above.  A left-sided  hemitransfixion incision was sharply executed and carried down to the caudal  edge of the quadrangular cartilage and continued on to a floor incision.  A  small right floor tunnel was executed.  Floor tunnels were carefully  elevated on both sides, taken posteriorly in the nose, brought medially and  then forward under the maxillary crest.  The submucoperichondrial plane of  the left septum was carefully elevated, taking care to work around the  perforation without violating the membrane.  This was relatively easily  accomplished.  The dissection was carried back to the perpendicular plate of  the ethmoid, brought down to the floor of the nose and brought forward along  the maxillary crest with the  flap raised intact.   The chondroethmoid junction was identified and opened with the Cottle  elevator and the opposite submucoperiosteal plane of the septum was  elevated.  The superior perpendicular plate was lysed with an open  Jansen-Middleton forceps.  The midportion was rocked after submucosal  dissection and then delivered.  Additional cartilaginous tag along the  maxillary crest/vomer was submucosally dissected and removed.  Some spicules  of posterior bone were also elevated and then removed.   At this point, a tongue of cartilage tailored to fit into the perforation  was removed from the posterior inferior corner of the quadrangular cartilage  for later reconstruction.  The cartilage was dislocated from the maxillary  crest and dissected over towards the right,  communicating this with the  floor tunnel.  The mucosa around the right side of the perforation was  elevated for a short distance circumferentially.   There was some residual tilt of the cartilage towards the right.  Working  through the septal tunnel and then externally on the left and strictly  externally on the right, the quadrangular cartilage was separated from the  upper lateral cartilages on both sides.  This allowed the septum to trapdoor  more freely into the midline.  The inferior spurred base of the caudal strut  was submucosally resected, allowing the trapdoor to swing into the midline.  It was secured to the nasal spine with a figure-of-eight 4-0 PDS stitch.   After straightening the septum, and reducing the caudal strut slightly,  there was apparently sufficient mucosa on both sides to bring together to  close the perforation.   A relaxing incision was made high up in the inferior meatus on the left side  and the mucosa was mobilized from the floor of the nose to roll medially up  to the septum.  On the left side, the incision superiorly and the septum was  dissected and the septal flap was dissected downward.  The tongue of tissue  was closed first on the right side with interrupted 4-0 chromic stitches.  A  decent coverage of the perforation was accomplished.  The previously  harvested piece of cartilage was placed and then tailored.  It was secured  in the septum with a single quilting stitch of 4-0 plain gut.  At this  point, the left septal incision was closed with interrupted 4-0 chromic.  A  good closure of the perforation was accomplished.  Hemostasis was observed.  The hemitransfixion incision and floor tunnel incisions were both closed  with interrupted 4-0 chromic stitch.   Just prior to completing the septoplasty, the inferior turbinates were  infiltrated with 1% Xylocaine with 1:100,000 epinephrine, 6 mL total. Several minutes were allowed for this to take  effect.   Beginning on the right side, the anterior pole of the inferior turbinate was  lysed just behind the nasal valve and the medial mucosa was incised in an  anterior upsloping fashion.  A laterally based flap was developed.  The  turbinate was in-fractured.  Using angled turbinate scissors, the lateral  mucosa and turbinate bone were dissected and avulsed in a posterior  downsloping fashion, taking virtually all the anterior pole and leaving  virtually all the posterior pole.  The exposed portions of bone were removed  submucosally.  The posterior pole was subsequently coagulated, as were the  cut mucosal edges.  The flap was laid back down, the turbinate was  outfractured, and this side was completed.  The left side  was done in  identical fashion.   At this point, a 0.04 reinforce Silastic splint was placed against both  sides of the nasal septum and secured thereto with a 3-0 nylon stitch.  A  triple-thickness Neosporin-impregnated Telfa pack was fashioned and placed  low in the nose on each side to provide hemostasis to the turbinates.  At  this point, the procedure was completed.  This pharynx was suctioned free  and the throat pack was removed.  Hemostasis was observed.  The patient was  returned to Anesthesia, awakened, extubated, and transferred to recovery in  stable condition.   COMMENT:  [age]-year-old white female with a long history of a septal  deflection and with ulceration documented several years ago progressing to  perforation were the indication for today's procedure.  Anticipate a routine  postoperative recovery with attention to ice, elevation, analgesia,  antibiosis, and nasal hygiene measures.  We will remove the nasal packs in 1  day and the septal splints in 8-10 days.  Given history of obstructive sleep  apnea, we will observe for 23 hours overnight.      Gloris Manchester. Lazarus Salines, M.D.  Electronically Signed     KTW/MEDQ  D:  01/24/2005  T:   01/25/2005  Job:  [id number]   cc:   Hal T. Stoneking, M.D.  Fax: [phone]   Susan [name], M.D.  [address]  [place]  [place] [address]   Dorisann Frames, M.D.  Fax: [phone]

## 2010-09-22 NOTE — Op Note (Signed)
   NAME:  Susan [name], Susan [name]                       ACCOUNT NO.:  [id number]   MEDICAL RECORD NO.:  [id number]                   PATIENT TYPE:  AMB   LOCATION:  DSC                                  FACILITY:  [facility]   PHYSICIAN:  Rose Phi. Young, M.D.                DATE OF BIRTH:  October 18, 1950   DATE OF PROCEDURE:  07/28/2002  DATE OF DISCHARGE:                                 OPERATIVE REPORT   PREOPERATIVE DIAGNOSIS:  Ductal carcinoma in situ of the left breast.   POSTOPERATIVE DIAGNOSIS:  Ductal carcinoma in situ of the left breast.   OPERATION PERFORMED:  Left partial mastectomy with needle localization and  specimen mammography.   SURGEON:  Rose Phi. Maple Hudson, M.D.   ANESTHESIA:  General.   INDICATIONS FOR PROCEDURE:  This patient had presented with abnormal  microcalcifications on routine mammography and a core biopsy had shown DCIS.  She is scheduled for a wide excision.   DESCRIPTION OF PROCEDURE:  After suitable general anesthesia was induced,  the patient was placed in a supine position with the left breast prepped and  draped in the usual fashion.  The wire and retained needle were still in  place at about the 12 o'clock position of the left breast.  We then prepped  and draped the breast and the needle and wire.  A curved incision centered  on it was then made and then a wide excision of the wire and needle was  carried out.  Specimen oriented for the pathologist.  Specimen mammography  confirmed the removal of the previously placed clip.  The specimen then  submitted to the pathologist.   With good hemostasis obtained with the cautery, we closed the deeper tissue  with 3-0 Vicryl and the skin with subcuticular 4-0 Monocryl and Steri-  Strips.  Dressings applied.  The patient was then transferred to the  recovery room in satisfactory condition having tolerated the procedure well.                                                Rose Phi. Maple Hudson, M.D.    PRY/MEDQ  D:   07/28/2002  T:  07/29/2002  Job:  [id number]

## 2010-09-22 NOTE — H&P (Signed)
NAME:  Susan [name], Susan [name]                       ACCOUNT NO.:  [id number]   MEDICAL RECORD NO.:  [id number]                   PATIENT TYPE:  AMB   LOCATION:  SDC                                  FACILITY:  [facility]   PHYSICIAN:  Dineen Kid. Rana Snare, M.D.                 DATE OF BIRTH:  March 16, 1951   DATE OF ADMISSION:  02/18/2003  DATE OF DISCHARGE:                                HISTORY & PHYSICAL   HISTORY OF PRESENT ILLNESS:  Susan [name] is a 60 year old, G5, P3, A2, who  presented to me originally continuing to have pain off and on of the right  side that radiates through to her back and hip.  She had ovarian cyst on and  off in the last year.  She is menopausal based upon a recent FHS.  She does  have a history of breast cancer and until recently was on Coumadin for a  deep venous thrombosis.  She had a recent sonogram and CT scan which showed  a right ovarian cyst which was partially collapsed.  A CT scan was performed  due to the ongoing abdominal pain and discomfort.  Because she has continued  to have pain and discomfort currently requiring Vicodin, she desired  definitive surgical removal and evaluation.  She has had a normal CA125 and  recently was taken off of her Coumadin, which they felt was due possibly to  tamoxifen that she was on for her breast cancer.  The ultrasound shows a  right ovarian cyst measuring 2.3 x 2.2 x 2.0 with a 4 mm nodule within the  wall of the cyst.  The left ovary has a follicle measuring 1.06 in size.  There is no mass or fluid in the cul-de-sac.  There is a lot of distended  bowel in the area.   PAST SURGICAL HISTORY:  Significant for:  1. Breast cancer with a left lumpectomy in March of 2004.  2. Hysterectomy for fibroids in 1991.  3. Partial thyroidectomy when younger.   PAST MEDICAL HISTORY:  1. Depression.  2. Gastrointestinal reflux.  3. Elevated blood pressure.   MEDICATIONS:  1. Tamoxifen 10 mg a day.  2. Nexium 40 mg a day.  3.  Hydrocortisone 12.5 mg a day.  4. Celexa 20 mg a day.  5. Darvocet as needed.  6. Flexeril as needed.   ALLERGIES:  She has an allergy to PENICILLIN.   PHYSICAL EXAMINATION:  HEART:  Regular rate and rhythm.  LUNGS:  Clear to auscultation bilaterally.  ABDOMEN:  Nondistended and nontender.  PELVIC:  Without masses.  Mildly tender to deep palpation.   IMPRESSION:  Right ovarian cyst which looks suspicious for hemorrhagic  corpus luteum.  Discussed different options at length Cheri.  Because of the  persistent pelvic pain and persistent right ovarian cyst, we recommend  surgical removal of the ovarian cyst since they continue to cause pain.  She  does have a history of breast cancer and a normal CA125.  Recently  discontinued her Coumadin per Dr. Laverle Hobby recommendations.  She is  prepared to proceed with laparoscopy.   PLAN:  Laparoscopic bilateral salpingo-oophorectomy.  The risks and benefits  were discussed at length, which include, but not limited to risk of  infection, bleeding, damage to ureters, bowel or bladder, risks associated  with blood transfusion, and risks associated with anesthesia.  She  understands that if this looks like it is cancerous or precancerous that  possible laparotomy and ovarian staging would be performed.  She does give  her informed consent.  She also understands that this will make her  menopausal and she is not a good candidate for hormone replacement therapy  due to her breast cancer.  She gives her informed consent.                                               Dineen Kid Rana Snare, M.D.    DCL/MEDQ  D:  02/17/2003  T:  02/17/2003  Job:  [id number]

## 2010-09-22 NOTE — Op Note (Signed)
Susan [name], Susan [name]             ACCOUNT NO.:  [id number]   MEDICAL RECORD NO.:  [id number]          PATIENT TYPE:  INP   LOCATION:  X005                         FACILITY:  [facility]   PHYSICIAN:  Madlyn Frankel. Charlann Boxer, M.D.  DATE OF BIRTH:  18-Aug-1950   DATE OF PROCEDURE:  07/04/2006  DATE OF DISCHARGE:                               OPERATIVE REPORT   PREOPERATIVE DIAGNOSIS:  Right knee osteoarthritis.   POSTOPERATIVE DIAGNOSIS:  Right knee osteoarthritis.   PROCEDURE:  Right total knee replacement.   COMPONENTS USED:  DePuy rotating platform posterior stabilized knee  system, size 3 femur, 2.5 tibia, 10 mm insert, and a 35 patellar button.   SURGEON:  Madlyn Frankel. Charlann Boxer, M.D.   ASSISTANT:  Yetta Glassman. Mann, P.A.-C.   ANESTHESIA:  Duramorph spinal plus MAC.   SPECIMENS:  None.   DRAINS:  One.   BLOOD LOSS:  100 mL.   TOURNIQUET TIME:  60 minutes at 300 mmHg.   COMPLICATIONS:  None.   INDICATIONS FOR PROCEDURE:  Susan [name] is a 60 year old female who I  have been following for some time for right knee issues.  She has failed  conservative measures including arthroscopic debridement of the meniscus  with progression of her arthritic symptoms and inability to maintain  relief of her symptoms with conservative measures.  Following some  deliberation, she wished to proceed with knee replacement surgery.  We  reviewed the risks and benefits including infection, DVT, neurovascular  injury, persistent pain postop, and need for revision surgery at some  point in her life.  Consent was obtained.   PROCEDURE IN DETAIL:  The patient was brought to the operative theater.  Once adequate anesthesia and preoperative antibiotics, 600 mg of  clindamycin, were administered, the patient was positioned supine.  A  proximal thigh tourniquet was placed.  The right lower extremity was  then pre-scrubbed and then prepped and draped in a sterile fashion.  The  leg was exsanguinated and the  tourniquet elevated.  The midline incision  was made followed by a modified median parapatellar arthrotomy with  patellar subluxation rather than dislocation.  Following some early  debridements of synovium and fat pad tissue, attention was first  directed the patella.  Following debridement of the suprapatellar pouch  synovium, I measured the patella to be 21 mm.  I resected down to 15 mm  and used a 35 patellar button.  At this point, trial metal shim was  placed to prevent the patella from any damage from the retractors or saw  blades.   At this point, retractors were placed and attention was directed to the  femur.  The femoral canal was opened with a drill and then irrigated to  prevent fat emboli.  An intramedullary rod was then passed and at 5  degrees of valgus, I resected 10 mm of bone off the distal femur.  I  then sized the femur with the sizing jig based off the posterior  condyle.  It appeared to be size 3, perhaps a little bit bigger than a  3.  For this reason, I pinned it  at 3 but just a little bit higher.  The  anterior, posterior, and chamfer cuts were then made without  complications.  I cut the trochlear box into the lateral aspect of the  distal femur.  Following this, debridement of osteophytes including the  gutters to release the medial and lateral collateral ligaments was  carried out.   Attention was then directed to the tibia.  Tibial exposure was obtained  routinely including meniscectomies.  At this point, with the  extramedullary jig in place, I resected 10 mm of bone based off the  lateral tibial plateau surface.  The cut surface was felt to be more  consistent with a 2.5 tibial tray.  The alignment rod passed to the  center of the ankle in the AP and lateral planes.  I was happy with this  cut. The tray was then pinned, drilled, and keel punched in position.  The trial reduction was then carried out.  Please note that prior to the  use of the tibial tray  and positioning, I did check with an extension  block and found the knee came out to full extension.   At this point, the trial reduction was carried out and the knee came out  to full extension.  The patella tracked without application of pressure.  Importantly, the knee was stable from extension into flexion.  Given  this, all the trial components were removed. The knee was debrided and  irrigated with pulse lavage as needed.  I then injected the knee with 60  mL of Marcaine with epinephrine and Toradol.  Once the cement was mixed,  the components were cemented into position, the tibia first then the  femur.  The knee was brought out to extension with a 10 mm insert in  place.  Excessive cement was debrided around all three components.  Once  the cement had cured, excessive cement was debrided throughout the knee.  Once I was satisfied there was no visualized cement, I placed the final  10 mm polyethylene insert.  The knee was reduced and irrigated with  pulse lavage.  A medium Hemovac drain was placed deep.  We  reapproximated the extensor mechanism with #1 Vicryl, the remainder of  the wound with 2-0 Vicryl and a running 4-0 Monocryl.  The patient's  knee wound was cleaned, dried, and dressed sterilely with Steri-Strips,  dressing sponges, and a bulky wrap.  She was brought to the recovery  room in stable condition.      Madlyn Frankel Charlann Boxer, M.D.  Electronically Signed     MDO/MEDQ  D:  07/04/2006  T:  07/04/2006  Job:  [id number]

## 2010-10-04 ENCOUNTER — Other Ambulatory Visit: Payer: Self-pay | Admitting: Family Medicine

## 2010-10-04 DIAGNOSIS — Z1231 Encounter for screening mammogram for malignant neoplasm of breast: Secondary | ICD-10-CM

## 2010-10-04 DIAGNOSIS — M8580 Other specified disorders of bone density and structure, unspecified site: Secondary | ICD-10-CM

## 2010-10-24 ENCOUNTER — Ambulatory Visit
Admission: RE | Admit: 2010-10-24 | Discharge: 2010-10-24 | Disposition: A | Discharge status: Home / Self Care | Payer: 59 | Source: Ambulatory Visit | Attending: Family Medicine | Admitting: Family Medicine

## 2010-10-24 DIAGNOSIS — Z1231 Encounter for screening mammogram for malignant neoplasm of breast: Secondary | ICD-10-CM

## 2010-10-24 DIAGNOSIS — M8580 Other specified disorders of bone density and structure, unspecified site: Secondary | ICD-10-CM

## 2010-10-24 LAB — HM MAMMOGRAPHY: HM Mammogram: NEGATIVE

## 2011-12-21 ENCOUNTER — Ambulatory Visit: Payer: 59

## 2011-12-21 ENCOUNTER — Ambulatory Visit (INDEPENDENT_AMBULATORY_CARE_PROVIDER_SITE_OTHER): Payer: 59 | Admitting: Family Medicine

## 2011-12-21 VITALS — BP 138/82 | HR 76 | Temp 98.8°F | Resp 14 | Ht 65.0 in | Wt 223.2 lb

## 2011-12-21 DIAGNOSIS — M542 Cervicalgia: Secondary | ICD-10-CM

## 2011-12-21 MED ORDER — CYCLOBENZAPRINE HCL 10 MG PO TABS: 10.0000 mg | ORAL_TABLET | Freq: Two times a day (BID) | ORAL | Status: DC | PRN

## 2011-12-21 MED ORDER — HYDROCODONE-ACETAMINOPHEN 5-500 MG PO TABS: 1.0000 | ORAL_TABLET | Freq: Four times a day (QID) | ORAL | Status: DC | PRN

## 2011-12-21 NOTE — Progress Notes (Signed)
[facility] [address] [place] [address] [id number] [phone]  Date:  12/21/2011   Name:  Susan [name]   DOB:  Sep 04, 1950   MRN:  [id number]  PCP:  No primary provider on file.    Chief Complaint: Neck Pain   History of Present Illness:  Susan [name] is a 61 y.o. very pleasant female patient who presents with the following:  She is here today with neck pain.  Over the summer her garage door broke- it is made of wood and quite heavy.  She lifted the door open a few times which was a strain.  She has noted some thoracic back pain- however this is not really new for her.  She has pain and tension on both sides of her neck.  It stated on the right, but is now bilateral.   Her neck pain started when she woke up with a "crick" in her neck which never got better.   Her neck muscles feel tight.  She had some flexeril- she had used this but ran out. She also has some vicodin 5/500 which she takes one daily as needed.  She has the flexeril and vicodin for fibromyalgia.    He has not noted numbness or tingling in her arms.  However, her lower lip has twitched some for the last couple of weeks.- this is finally better.     She has been told that she had degenerative changes in her thoracic spine in the past by doctor at [facility].    There is no problem list on file for this patient.   No past medical history on file.  No past surgical history on file.  History  Substance Use Topics  . Smoking status: Current Everyday Smoker -- 0.5 packs/day    Types: Cigarettes  . Smokeless tobacco: Not on file  . Alcohol Use: Not on file    No family history on file.  Allergies  Allergen Reactions  . Penicillins Swelling  . Ciprofloxacin Other (See Comments)    Muscle tightness, tendon aching and pain  . Lyrica (Pregabalin) Swelling  . Neurontin (Gabapentin) Swelling  . Sulfa Antibiotics Rash    Medication list has been reviewed and  updated.  Current Outpatient Prescriptions on File Prior to Visit  Medication Sig Dispense Refill  . calcium carbonate 200 MG capsule Take 250 mg by mouth 2 (two) times daily with a meal.      . DULoxetine (CYMBALTA) 30 MG capsule Take 30 mg by mouth daily. 3 capsules once daily      . esomeprazole (NEXIUM) 40 MG capsule Take 40 mg by mouth daily before breakfast.      . fenofibrate 160 MG tablet Take 160 mg by mouth daily. Taking 120mg  daily      . hydrochlorothiazide (HYDRODIURIL) 25 MG tablet Take 25 mg by mouth daily.      Marland Kitchen levothyroxine (SYNTHROID, LEVOTHROID) 25 MCG tablet Take 25 mcg by mouth daily.      . metoprolol tartrate (LOPRESSOR) 25 MG tablet Take 25 mg by mouth 2 (two) times daily.      . temazepam (RESTORIL) 30 MG capsule Take 30 mg by mouth at bedtime as needed.        Review of Systems:  As per HPI- otherwise negative.   Physical Examination: Filed Vitals:   12/21/11 0956  BP: 138/82  Pulse: 76  Temp: 98.8 F (37.1 C)  Resp: 14   Filed Vitals:  12/21/11 0956  Height: 5\' 5"  (1.651 m)  Weight: 223 lb 3.2 oz (101.243 kg)   Body mass index is 37.14 kg/(m^2). Ideal Body Weight: Weight in (lb) to have BMI = 25: 149.9   GEN: WDWN, NAD, Non-toxic, A & O x 3, obese HEENT: Atraumatic, Normocephalic. Neck supple. No masses, No LAD. She does not have bony TTP over her cervical spine.  However, she does have paraspinous muscle tenderness, right more than left Ears and Nose: No external deformity. CV: RRR, No M/G/R. No JVD. No thrill. No extra heart sounds. PULM: CTA B, no wheezes, crackles, rhonchi. No retractions. No resp. distress. No accessory muscle use. EXTR: No c/c/e NEURO Normal gait. Normal UE strength and DTR.   PSYCH: Normally interactive. Conversant. Not depressed or anxious appearing.  Calm demeanor.   [facility] reading (PRIMARY) by  Dr. Patsy Lager.   Moderate degenerative changes, otherwise negative  CERVICAL SPINE - 4+ VIEWS  Comparison:  None.  Findings: No evidence of cervical spine fracture, subluxation, or prevertebral soft tissue swelling. Severe degenerative disc disease is seen at C6-7. Mild to moderate facet DJD is seen on the left from levels of C4-C7.  IMPRESSION: 1. No acute findings. 2. Degenerative cervical spondylosis, as described above.  Assessment and Plan: 1. Neck pain  DG Cervical Spine Complete, cyclobenzaprine (FLEXERIL) 10 MG tablet, HYDROcodone-acetaminophen (VICODIN) 5-500 MG per tablet   Severe OA of the neck.  Susan [name] tried on a soft collar and liked it- however she pull purchase one as it is cheaper for her.  Gave rx for flexeril and vicodin- she has used both of these in the past as needed for her fibromyalgia and is familiar with them.  Let me know if not better in the next few days- Sooner if worse.     Abbe Amsterdam, MD

## 2011-12-26 ENCOUNTER — Encounter: Payer: Self-pay | Admitting: Family Medicine

## 2011-12-26 ENCOUNTER — Ambulatory Visit (INDEPENDENT_AMBULATORY_CARE_PROVIDER_SITE_OTHER): Payer: 59 | Admitting: Family Medicine

## 2011-12-26 VITALS — BP 143/78 | HR 85 | Temp 97.6°F | Ht 65.0 in | Wt 228.0 lb

## 2011-12-26 DIAGNOSIS — E669 Obesity, unspecified: Secondary | ICD-10-CM

## 2011-12-26 DIAGNOSIS — M899 Disorder of bone, unspecified: Secondary | ICD-10-CM

## 2011-12-26 DIAGNOSIS — Z1231 Encounter for screening mammogram for malignant neoplasm of breast: Secondary | ICD-10-CM

## 2011-12-26 DIAGNOSIS — F341 Dysthymic disorder: Secondary | ICD-10-CM

## 2011-12-26 DIAGNOSIS — F418 Other specified anxiety disorders: Secondary | ICD-10-CM

## 2011-12-26 DIAGNOSIS — M62838 Other muscle spasm: Secondary | ICD-10-CM

## 2011-12-26 DIAGNOSIS — F419 Anxiety disorder, unspecified: Secondary | ICD-10-CM

## 2011-12-26 DIAGNOSIS — I10 Essential (primary) hypertension: Secondary | ICD-10-CM

## 2011-12-26 DIAGNOSIS — M542 Cervicalgia: Secondary | ICD-10-CM

## 2011-12-26 DIAGNOSIS — M8580 Other specified disorders of bone density and structure, unspecified site: Secondary | ICD-10-CM

## 2011-12-26 DIAGNOSIS — M1990 Unspecified osteoarthritis, unspecified site: Secondary | ICD-10-CM | POA: Insufficient documentation

## 2011-12-26 DIAGNOSIS — G4730 Sleep apnea, unspecified: Secondary | ICD-10-CM

## 2011-12-26 DIAGNOSIS — Z1239 Encounter for other screening for malignant neoplasm of breast: Secondary | ICD-10-CM

## 2011-12-26 MED ORDER — DULOXETINE HCL 30 MG PO CPEP: 90.0000 mg | ORAL_CAPSULE | Freq: Every day | ORAL | Status: DC

## 2011-12-26 MED ORDER — HYDROCHLOROTHIAZIDE 25 MG PO TABS: 25.0000 mg | ORAL_TABLET | Freq: Every day | ORAL | Status: DC

## 2011-12-26 NOTE — Patient Instructions (Addendum)
Preventive Care for Adults, Female A healthy lifestyle and preventive care can promote health and wellness. Preventive health guidelines for women include the following key practices.  A routine yearly physical is a good way to check with your caregiver about your health and preventive screening. It is a chance to share any concerns and updates on your health, and to receive a thorough exam.   Visit your dentist for a routine exam and preventive care every 6 months. Brush your teeth twice a day and floss once a day. Good oral hygiene prevents tooth decay and gum disease.   The frequency of eye exams is based on your age, health, family medical history, use of contact lenses, and other factors. Follow your caregiver's recommendations for frequency of eye exams.   Eat a healthy diet. Foods like vegetables, fruits, whole grains, low-fat dairy products, and lean protein foods contain the nutrients you need without too many calories. Decrease your intake of foods high in solid fats, added sugars, and salt. Eat the right amount of calories for you.Get information about a proper diet from your caregiver, if necessary.   Regular physical exercise is one of the most important things you can do for your health. Most adults should get at least 150 minutes of moderate-intensity exercise (any activity that increases your heart rate and causes you to sweat) each week. In addition, most adults need muscle-strengthening exercises on 2 or more days a week.   Maintain a healthy weight. The body mass index (BMI) is a screening tool to identify possible weight problems. It provides an estimate of body fat based on height and weight. Your caregiver can help determine your BMI, and can help you achieve or maintain a healthy weight.For adults 20 years and older:   A BMI below 18.5 is considered underweight.   A BMI of 18.5 to 24.9 is normal.   A BMI of 25 to 29.9 is considered overweight.   A BMI of 30 and above is  considered obese.   Maintain normal blood lipids and cholesterol levels by exercising and minimizing your intake of saturated fat. Eat a balanced diet with plenty of fruit and vegetables. Blood tests for lipids and cholesterol should begin at age 20 and be repeated every 5 years. If your lipid or cholesterol levels are high, you are over 50, or you are at high risk for heart disease, you may need your cholesterol levels checked more frequently.Ongoing high lipid and cholesterol levels should be treated with medicines if diet and exercise are not effective.   If you smoke, find out from your caregiver how to quit. If you do not use tobacco, do not start.   If you are pregnant, do not drink alcohol. If you are breastfeeding, be very cautious about drinking alcohol. If you are not pregnant and choose to drink alcohol, do not exceed 1 drink per day. One drink is considered to be 12 ounces (355 mL) of beer, 5 ounces (148 mL) of wine, or 1.5 ounces (44 mL) of liquor.   Avoid use of street drugs. Do not share needles with anyone. Ask for help if you need support or instructions about stopping the use of drugs.   High blood pressure causes heart disease and increases the risk of stroke. Your blood pressure should be checked at least every 1 to 2 years. Ongoing high blood pressure should be treated with medicines if weight loss and exercise are not effective.   If you are 55 to 61   years old, ask your caregiver if you should take aspirin to prevent strokes.   Diabetes screening involves taking a blood sample to check your fasting blood sugar level. This should be done once every 3 years, after age 45, if you are within normal weight and without risk factors for diabetes. Testing should be considered at a younger age or be carried out more frequently if you are overweight and have at least 1 risk factor for diabetes.   Breast cancer screening is essential preventive care for women. You should practice "breast  self-awareness." This means understanding the normal appearance and feel of your breasts and may include breast self-examination. Any changes detected, no matter how small, should be reported to a caregiver. Women in their 20s and 30s should have a clinical breast exam (CBE) by a caregiver as part of a regular health exam every 1 to 3 years. After age 40, women should have a CBE every year. Starting at age 40, women should consider having a mammography (breast X-ray test) every year. Women who have a family history of breast cancer should talk to their caregiver about genetic screening. Women at a high risk of breast cancer should talk to their caregivers about having magnetic resonance imaging (MRI) and a mammography every year.   The Pap test is a screening test for cervical cancer. A Pap test can show cell changes on the cervix that might become cervical cancer if left untreated. A Pap test is a procedure in which cells are obtained and examined from the lower end of the uterus (cervix).   Women should have a Pap test starting at age 21.   Between ages 21 and 29, Pap tests should be repeated every 2 years.   Beginning at age 30, you should have a Pap test every 3 years as long as the past 3 Pap tests have been normal.   Some women have medical problems that increase the chance of getting cervical cancer. Talk to your caregiver about these problems. It is especially important to talk to your caregiver if a new problem develops soon after your last Pap test. In these cases, your caregiver may recommend more frequent screening and Pap tests.   The above recommendations are the same for women who have or have not gotten the vaccine for human papillomavirus (HPV).   If you had a hysterectomy for a problem that was not cancer or a condition that could lead to cancer, then you no longer need Pap tests. Even if you no longer need a Pap test, a regular exam is a good idea to make sure no other problems are  starting.   If you are between ages 65 and 70, and you have had normal Pap tests going back 10 years, you no longer need Pap tests. Even if you no longer need a Pap test, a regular exam is a good idea to make sure no other problems are starting.   If you have had past treatment for cervical cancer or a condition that could lead to cancer, you need Pap tests and screening for cancer for at least 20 years after your treatment.   If Pap tests have been discontinued, risk factors (such as a new sexual partner) need to be reassessed to determine if screening should be resumed.   The HPV test is an additional test that may be used for cervical cancer screening. The HPV test looks for the virus that can cause the cell changes on the cervix.   The cells collected during the Pap test can be tested for HPV. The HPV test could be used to screen women aged 30 years and older, and should be used in women of any age who have unclear Pap test results. After the age of 30, women should have HPV testing at the same frequency as a Pap test.   Colorectal cancer can be detected and often prevented. Most routine colorectal cancer screening begins at the age of 50 and continues through age 75. However, your caregiver may recommend screening at an earlier age if you have risk factors for colon cancer. On a yearly basis, your caregiver may provide home test kits to check for hidden blood in the stool. Use of a small camera at the end of a tube, to directly examine the colon (sigmoidoscopy or colonoscopy), can detect the earliest forms of colorectal cancer. Talk to your caregiver about this at age 50, when routine screening begins. Direct examination of the colon should be repeated every 5 to 10 years through age 75, unless early forms of pre-cancerous polyps or small growths are found.   Hepatitis C blood testing is recommended for all people born from 1945 through 1965 and any individual with known risks for hepatitis C.    Practice safe sex. Use condoms and avoid high-risk sexual practices to reduce the spread of sexually transmitted infections (STIs). STIs include gonorrhea, chlamydia, syphilis, trichomonas, herpes, HPV, and human immunodeficiency virus (HIV). Herpes, HIV, and HPV are viral illnesses that have no cure. They can result in disability, cancer, and death. Sexually active women aged 25 and younger should be checked for chlamydia. Older women with new or multiple partners should also be tested for chlamydia. Testing for other STIs is recommended if you are sexually active and at increased risk.   Osteoporosis is a disease in which the bones lose minerals and strength with aging. This can result in serious bone fractures. The risk of osteoporosis can be identified using a bone density scan. Women ages 65 and over and women at risk for fractures or osteoporosis should discuss screening with their caregivers. Ask your caregiver whether you should take a calcium supplement or vitamin D to reduce the rate of osteoporosis.   Menopause can be associated with physical symptoms and risks. Hormone replacement therapy is available to decrease symptoms and risks. You should talk to your caregiver about whether hormone replacement therapy is right for you.   Use sunscreen with sun protection factor (SPF) of 30 or more. Apply sunscreen liberally and repeatedly throughout the day. You should seek shade when your shadow is shorter than you. Protect yourself by wearing long sleeves, pants, a wide-brimmed hat, and sunglasses year round, whenever you are outdoors.   Once a month, do a whole body skin exam, using a mirror to look at the skin on your back. Notify your caregiver of new moles, moles that have irregular borders, moles that are larger than a pencil eraser, or moles that have changed in shape or color.   Stay current with required immunizations.   Influenza. You need a dose every fall (or winter). The composition of  the flu vaccine changes each year, so being vaccinated once is not enough.   Pneumococcal polysaccharide. You need 1 to 2 doses if you smoke cigarettes or if you have certain chronic medical conditions. You need 1 dose at age 65 (or older) if you have never been vaccinated.   Tetanus, diphtheria, pertussis (Tdap, Td). Get 1 dose of   Tdap vaccine if you are younger than age 65, are over 65 and have contact with an infant, are a healthcare worker, are pregnant, or simply want to be protected from whooping cough. After that, you need a Td booster dose every 10 years. Consult your caregiver if you have not had at least 3 tetanus and diphtheria-containing shots sometime in your life or have a deep or dirty wound.   HPV. You need this vaccine if you are a woman age 26 or younger. The vaccine is given in 3 doses over 6 months.   Measles, mumps, rubella (MMR). You need at least 1 dose of MMR if you were born in 1957 or later. You may also need a second dose.   Meningococcal. If you are age 19 to 21 and a first-year college student living in a residence hall, or have one of several medical conditions, you need to get vaccinated against meningococcal disease. You may also need additional booster doses.   Zoster (shingles). If you are age 60 or older, you should get this vaccine.   Varicella (chickenpox). If you have never had chickenpox or you were vaccinated but received only 1 dose, talk to your caregiver to find out if you need this vaccine.   Hepatitis A. You need this vaccine if you have a specific risk factor for hepatitis A virus infection or you simply wish to be protected from this disease. The vaccine is usually given as 2 doses, 6 to 18 months apart.   Hepatitis B. You need this vaccine if you have a specific risk factor for hepatitis B virus infection or you simply wish to be protected from this disease. The vaccine is given in 3 doses, usually over 6 months.  Preventive Services /  Frequency Ages 19 to 39  Blood pressure check.** / Every 1 to 2 years.   Lipid and cholesterol check.** / Every 5 years beginning at age 20.   Clinical breast exam.** / Every 3 years for women in their 20s and 30s.   Pap test.** / Every 2 years from ages 21 through 29. Every 3 years starting at age 30 through age 65 or 70 with a history of 3 consecutive normal Pap tests.   HPV screening.** / Every 3 years from ages 30 through ages 65 to 70 with a history of 3 consecutive normal Pap tests.   Hepatitis C blood test.** / For any individual with known risks for hepatitis C.   Skin self-exam. / Monthly.   Influenza immunization.** / Every year.   Pneumococcal polysaccharide immunization.** / 1 to 2 doses if you smoke cigarettes or if you have certain chronic medical conditions.   Tetanus, diphtheria, pertussis (Tdap, Td) immunization. / A one-time dose of Tdap vaccine. After that, you need a Td booster dose every 10 years.   HPV immunization. / 3 doses over 6 months, if you are 26 and younger.   Measles, mumps, rubella (MMR) immunization. / You need at least 1 dose of MMR if you were born in 1957 or later. You may also need a second dose.   Meningococcal immunization. / 1 dose if you are age 19 to 21 and a first-year college student living in a residence hall, or have one of several medical conditions, you need to get vaccinated against meningococcal disease. You may also need additional booster doses.   Varicella immunization.** / Consult your caregiver.   Hepatitis A immunization.** / Consult your caregiver. 2 doses, 6 to 18 months   apart.   Hepatitis B immunization.** / Consult your caregiver. 3 doses usually over 6 months.  Ages 40 to 64  Blood pressure check.** / Every 1 to 2 years.   Lipid and cholesterol check.** / Every 5 years beginning at age 20.   Clinical breast exam.** / Every year after age 40.   Mammogram.** / Every year beginning at age 40 and continuing for as  long as you are in good health. Consult with your caregiver.   Pap test.** / Every 3 years starting at age 30 through age 65 or 70 with a history of 3 consecutive normal Pap tests.   HPV screening.** / Every 3 years from ages 30 through ages 65 to 70 with a history of 3 consecutive normal Pap tests.   Fecal occult blood test (FOBT) of stool. / Every year beginning at age 50 and continuing until age 75. You may not need to do this test if you get a colonoscopy every 10 years.   Flexible sigmoidoscopy or colonoscopy.** / Every 5 years for a flexible sigmoidoscopy or every 10 years for a colonoscopy beginning at age 50 and continuing until age 75.   Hepatitis C blood test.** / For all people born from 1945 through 1965 and any individual with known risks for hepatitis C.   Skin self-exam. / Monthly.   Influenza immunization.** / Every year.   Pneumococcal polysaccharide immunization.** / 1 to 2 doses if you smoke cigarettes or if you have certain chronic medical conditions.   Tetanus, diphtheria, pertussis (Tdap, Td) immunization.** / A one-time dose of Tdap vaccine. After that, you need a Td booster dose every 10 years.   Measles, mumps, rubella (MMR) immunization. / You need at least 1 dose of MMR if you were born in 1957 or later. You may also need a second dose.   Varicella immunization.** / Consult your caregiver.   Meningococcal immunization.** / Consult your caregiver.   Hepatitis A immunization.** / Consult your caregiver. 2 doses, 6 to 18 months apart.   Hepatitis B immunization.** / Consult your caregiver. 3 doses, usually over 6 months.  Ages 65 and over  Blood pressure check.** / Every 1 to 2 years.   Lipid and cholesterol check.** / Every 5 years beginning at age 20.   Clinical breast exam.** / Every year after age 40.   Mammogram.** / Every year beginning at age 40 and continuing for as long as you are in good health. Consult with your caregiver.   Pap test.** /  Every 3 years starting at age 30 through age 65 or 70 with a 3 consecutive normal Pap tests. Testing can be stopped between 65 and 70 with 3 consecutive normal Pap tests and no abnormal Pap or HPV tests in the past 10 years.   HPV screening.** / Every 3 years from ages 30 through ages 65 or 70 with a history of 3 consecutive normal Pap tests. Testing can be stopped between 65 and 70 with 3 consecutive normal Pap tests and no abnormal Pap or HPV tests in the past 10 years.   Fecal occult blood test (FOBT) of stool. / Every year beginning at age 50 and continuing until age 75. You may not need to do this test if you get a colonoscopy every 10 years.   Flexible sigmoidoscopy or colonoscopy.** / Every 5 years for a flexible sigmoidoscopy or every 10 years for a colonoscopy beginning at age 50 and continuing until age 75.   Hepatitis   C blood test.** / For all people born from 1945 through 1965 and any individual with known risks for hepatitis C.   Osteoporosis screening.** / A one-time screening for women ages 65 and over and women at risk for fractures or osteoporosis.   Skin self-exam. / Monthly.   Influenza immunization.** / Every year.   Pneumococcal polysaccharide immunization.** / 1 dose at age 65 (or older) if you have never been vaccinated.   Tetanus, diphtheria, pertussis (Tdap, Td) immunization. / A one-time dose of Tdap vaccine if you are over 65 and have contact with an infant, are a healthcare worker, or simply want to be protected from whooping cough. After that, you need a Td booster dose every 10 years.   Varicella immunization.** / Consult your caregiver.   Meningococcal immunization.** / Consult your caregiver.   Hepatitis A immunization.** / Consult your caregiver. 2 doses, 6 to 18 months apart.   Hepatitis B immunization.** / Check with your caregiver. 3 doses, usually over 6 months.  ** Family history and personal history of risk and conditions may change your caregiver's  recommendations. Document Released: 06/19/2001 Document Revised: 04/12/2011 Document Reviewed: 09/18/2010 [employer] Patient Information 2012 [employer]. 

## 2011-12-30 ENCOUNTER — Encounter: Payer: Self-pay | Admitting: Family Medicine

## 2011-12-30 DIAGNOSIS — C801 Malignant (primary) neoplasm, unspecified: Secondary | ICD-10-CM | POA: Insufficient documentation

## 2011-12-30 DIAGNOSIS — F32A Depression, unspecified: Secondary | ICD-10-CM | POA: Insufficient documentation

## 2011-12-30 DIAGNOSIS — F329 Major depressive disorder, single episode, unspecified: Secondary | ICD-10-CM | POA: Insufficient documentation

## 2011-12-30 DIAGNOSIS — M8580 Other specified disorders of bone density and structure, unspecified site: Secondary | ICD-10-CM | POA: Insufficient documentation

## 2011-12-30 DIAGNOSIS — M542 Cervicalgia: Secondary | ICD-10-CM | POA: Insufficient documentation

## 2011-12-30 DIAGNOSIS — M62838 Other muscle spasm: Secondary | ICD-10-CM | POA: Insufficient documentation
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
## 2011-12-30 NOTE — Assessment & Plan Note (Signed)
Will need to monitor vitamin D level and needs Citracal daily

## 2011-12-30 NOTE — Assessment & Plan Note (Signed)
Patient reports consistent use

## 2011-12-30 NOTE — Assessment & Plan Note (Signed)
Given DASH diet handout, encouraged increased activity and small frequent meals, with lean proteins and complex carbs

## 2011-12-30 NOTE — Assessment & Plan Note (Signed)
Moist heat, gentle stretching, Flexeril and Advil prn

## 2011-12-30 NOTE — Assessment & Plan Note (Signed)
Treated with Wellbutrin

## 2011-12-30 NOTE — Assessment & Plan Note (Signed)
Noting recent twitch in left eye lids, encouraged increased hydration and will check labs

## 2012-01-04 ENCOUNTER — Other Ambulatory Visit: Payer: Self-pay | Admitting: Family Medicine

## 2012-01-04 MED ORDER — ALPRAZOLAM 0.25 MG PO TABS: 0.2500 mg | ORAL_TABLET | Freq: Every evening | ORAL | Status: DC | PRN

## 2012-01-04 NOTE — Telephone Encounter (Signed)
Refill request for ALPRAZOLAM Last filled- NEVER Last seen- 12/26/11 Follow up - 1 MONTH, 01/28/12 Please advise refills.

## 2012-01-05 NOTE — Telephone Encounter (Signed)
Called to Susan [name] at [facility].

## 2012-01-21 ENCOUNTER — Other Ambulatory Visit (INDEPENDENT_AMBULATORY_CARE_PROVIDER_SITE_OTHER): Payer: 59

## 2012-01-21 DIAGNOSIS — I10 Essential (primary) hypertension: Secondary | ICD-10-CM

## 2012-01-21 DIAGNOSIS — M899 Disorder of bone, unspecified: Secondary | ICD-10-CM

## 2012-01-21 DIAGNOSIS — M949 Disorder of cartilage, unspecified: Secondary | ICD-10-CM

## 2012-01-21 LAB — LIPID PANEL
Total CHOL/HDL Ratio: 3
Triglycerides: 77 mg/dL (ref 0.0–149.0)

## 2012-01-21 LAB — CBC
Platelets: 228 10*3/uL (ref 150.0–400.0)
RBC: 4.18 Mil/uL (ref 3.87–5.11)
WBC: 4.7 10*3/uL (ref 4.5–10.5)

## 2012-01-21 LAB — HEPATIC FUNCTION PANEL
ALT: 39 U/L — ABNORMAL HIGH (ref 0–35)
AST: 26 U/L (ref 0–37)
Bilirubin, Direct: 0.1 mg/dL (ref 0.0–0.3)
Total Protein: 6.8 g/dL (ref 6.0–8.3)

## 2012-01-21 LAB — RENAL FUNCTION PANEL
Albumin: 4.1 g/dL (ref 3.5–5.2)
BUN: 18 mg/dL (ref 6–23)
Creatinine, Ser: 1 mg/dL (ref 0.4–1.2)
Glucose, Bld: 114 mg/dL — ABNORMAL HIGH (ref 70–99)
Phosphorus: 3.3 mg/dL (ref 2.3–4.6)
Potassium: 3.6 mEq/L (ref 3.5–5.1)

## 2012-01-21 LAB — TSH: TSH: 1.31 u[IU]/mL (ref 0.35–5.50)

## 2012-01-22 LAB — HEMOGLOBIN A1C: Hgb A1c MFr Bld: 6.1 % (ref 4.6–6.5)

## 2012-01-25 LAB — VITAMIN D 1,25 DIHYDROXY: Vitamin D 1, 25 (OH)2 Total: 53 pg/mL (ref 18–72)

## 2012-01-28 ENCOUNTER — Ambulatory Visit (INDEPENDENT_AMBULATORY_CARE_PROVIDER_SITE_OTHER): Payer: 59 | Admitting: Family Medicine

## 2012-01-28 ENCOUNTER — Encounter: Payer: Self-pay | Admitting: Family Medicine

## 2012-01-28 VITALS — BP 137/82 | HR 79 | Temp 98.0°F | Ht 65.0 in | Wt 230.1 lb

## 2012-01-28 DIAGNOSIS — Z0000 Encounter for general adult medical examination without abnormal findings: Secondary | ICD-10-CM

## 2012-01-28 DIAGNOSIS — K7689 Other specified diseases of liver: Secondary | ICD-10-CM

## 2012-01-28 DIAGNOSIS — Z23 Encounter for immunization: Secondary | ICD-10-CM

## 2012-01-28 DIAGNOSIS — K760 Fatty (change of) liver, not elsewhere classified: Secondary | ICD-10-CM

## 2012-01-28 DIAGNOSIS — M542 Cervicalgia: Secondary | ICD-10-CM

## 2012-01-28 DIAGNOSIS — R739 Hyperglycemia, unspecified: Secondary | ICD-10-CM

## 2012-01-28 DIAGNOSIS — M79672 Pain in left foot: Secondary | ICD-10-CM

## 2012-01-28 DIAGNOSIS — C801 Malignant (primary) neoplasm, unspecified: Secondary | ICD-10-CM

## 2012-01-28 DIAGNOSIS — I10 Essential (primary) hypertension: Secondary | ICD-10-CM

## 2012-01-28 DIAGNOSIS — E785 Hyperlipidemia, unspecified: Secondary | ICD-10-CM | POA: Insufficient documentation

## 2012-01-28 DIAGNOSIS — M62838 Other muscle spasm: Secondary | ICD-10-CM

## 2012-01-28 DIAGNOSIS — D649 Anemia, unspecified: Secondary | ICD-10-CM

## 2012-01-28 DIAGNOSIS — M79609 Pain in unspecified limb: Secondary | ICD-10-CM

## 2012-01-28 DIAGNOSIS — IMO0001 Reserved for inherently not codable concepts without codable children: Secondary | ICD-10-CM

## 2012-01-28 DIAGNOSIS — R7309 Other abnormal glucose: Secondary | ICD-10-CM

## 2012-01-28 DIAGNOSIS — M797 Fibromyalgia: Secondary | ICD-10-CM

## 2012-01-28 HISTORY — DX: Essential (primary) hypertension: I10

## 2012-01-28 HISTORY — DX: Hyperlipidemia, unspecified: E78.5

## 2012-01-28 MED ORDER — HYDROCODONE-ACETAMINOPHEN 5-500 MG PO TABS: 1.0000 | ORAL_TABLET | Freq: Four times a day (QID) | ORAL | Status: DC | PRN

## 2012-01-28 MED ORDER — HYDROCHLOROTHIAZIDE 25 MG PO TABS: 25.0000 mg | ORAL_TABLET | Freq: Every day | ORAL | Status: DC

## 2012-01-28 MED ORDER — KRILL OIL PO CAPS: ORAL_CAPSULE | ORAL | Status: DC

## 2012-01-28 MED ORDER — CYCLOBENZAPRINE HCL 10 MG PO TABS: 10.0000 mg | ORAL_TABLET | Freq: Two times a day (BID) | ORAL | Status: DC | PRN

## 2012-01-28 NOTE — Assessment & Plan Note (Signed)
Reviewed old records with patient, Tdap given in 2010, no pap for many years will schedule next visit to include a pelvic exam. Colonoscopy utd. Given flu shot today

## 2012-01-28 NOTE — Assessment & Plan Note (Addendum)
Given Refill on Cyclobenzaprine which she uses only qhs as needed, she agrees to try just 1/2 tab and see if she gets adequate relief, encouraged increased physical activity as well

## 2012-01-28 NOTE — Assessment & Plan Note (Signed)
Avoid simple carbs, one carb per meal and attempt modest weight gain. Will continue to monitor hgba1c

## 2012-01-28 NOTE — Assessment & Plan Note (Signed)
Encouraged avoid trans fats and start MegaRed krill oil caps daily, rechekc lipids at next visit

## 2012-01-28 NOTE — Assessment & Plan Note (Signed)
May try Aspercreme prn and follow up with orthopaedics if symptoms worsen

## 2012-01-28 NOTE — Assessment & Plan Note (Signed)
Resolved at present.

## 2012-01-28 NOTE — Assessment & Plan Note (Signed)
Struggles with chronic pain uses just 1 Hydrocodone a day on average, given a refill today and reminded to stay as physically active as possible

## 2012-01-28 NOTE — Assessment & Plan Note (Signed)
Very mild elevation of LFTs, reviewed old records including an abdominal ultrasound which confirms fatty liver infiltration

## 2012-01-28 NOTE — Patient Instructions (Addendum)
Call insurance to ask about Zostavax/shingles vaccine. Do they cover it and where is it cheapest for you to get it

## 2012-01-28 NOTE — Assessment & Plan Note (Signed)
Has MGM ordered for next week

## 2012-01-28 NOTE — Progress Notes (Signed)
Patient ID: Susan [name], female   DOB: [birth year], 61 y.o.   MRN: [id number] CERES AILLS [id number] 1950-09-12 01/28/2012      Progress Note-Follow Up  Subjective  Chief Complaint  Chief Complaint  Patient presents with  . Follow-up    1 month    HPI  Patient is a 61 year old Caucasian female who is in today for followup appointment. Overall she says there are no new complaints. She actually was playing laser tag sutures week and this was having her ongoing pain symptoms worsen. She continues to struggle with chronic diffuse pain most notably in her left heel. She is in the process of trying to get set back up with orthopedics. She still smokes a half pack per day and is ready to retry the patch and attempting to quit. No recent illness, fevers, chills, chest pain, headache, shortness of breath, GI or GU complaints. She does note she intermittently skipped heartbeat but has no significant symptoms with it fatigue, shortness of breath et Karie Soda.  Past Medical History  Diagnosis Date  . Sleep apnea     on CPAP  . Osteopenia   . Fatty liver disease, nonalcoholic   . Obesity   . History of blood clots 2004    during cancer treatment  . Anxiety   . Osteoarthritis   . DDD (degenerative disc disease)   . DJD (degenerative joint disease)   . Cancer 2004    ductal carcinoma; lumpectomy  . Depression   . GERD (gastroesophageal reflux disease)   . Fibromyalgia   . Peripheral neuropathy   . Muscle spasm 12/30/2011  . Anemia 01/28/2012  . HTN (hypertension) 01/28/2012  . Hyperlipidemia 01/28/2012  . Hyperglycemia 01/28/2012  . Preventative health care 01/28/2012    Past Surgical History  Procedure Date  . Breast surgery 2004    lumpectomy  . Joint replacement     right total knee  . Bilateral oophorectomy 01/2003  . Nasal septum surgery   . Carpal tunnel release     right  . Abdominal hysterectomy 1991  . Thyroidectomy, partial mid 80's    right side    Family  History  Problem Relation Age of Onset  . Cancer Mother     liver cancer, hep c  . Heart disease Mother     chf  . Hypertension Mother   . Hyperlipidemia Mother   . Osteoporosis Mother   . Cancer Father     lung  . COPD Father   . Cancer Sister     breast, non Hodgkin's Lymphoma  . Heart disease Sister     mvp  . Hypertension Brother   . Benign prostatic hyperplasia Brother   . Arthritis Brother   . Other Daughter     Beal's Syndrome  . Other Son     Beal's Syndrome  . Vision loss Maternal Grandmother   . Dementia Maternal Grandmother   . Vision loss Paternal Grandfather   . Hypertension Brother   . Benign prostatic hyperplasia Brother   . Cancer Brother     prostate  . Hypertension Brother   . COPD Brother   . Cancer Brother     prostate  . Hypertension Brother   . Hyperlipidemia Brother   . Cancer Brother     prostate  . Mental illness Brother     schizophrenia  . Cirrhosis Brother     hep c  . Hypertension Sister   . Other Sister  thalessemia, anemia  . Hyperlipidemia Sister   . Gout Sister   . Arthritis Daughter   . Other Daughter     overweight    History   Social History  . Marital Status: Married    Spouse Name: N/A    Number of Children: N/A  . Years of Education: N/A   Occupational History  . Not on file.   Social History Main Topics  . Smoking status: Current Every Day Smoker -- 0.5 packs/day    Types: Cigarettes  . Smokeless tobacco: Never Used  . Alcohol Use: No  . Drug Use: No  . Sexually Active: Not on file   Other Topics Concern  . Not on file   Social History Narrative  . No narrative on file    Current Outpatient Prescriptions on File Prior to Visit  Medication Sig Dispense Refill  . ALPRAZolam (XANAX) 0.25 MG tablet Take 1 tablet (0.25 mg total) by mouth at bedtime as needed for sleep or anxiety.  30 tablet  0  . calcium carbonate (OS-CAL) 600 MG TABS Take 1,800 mg by mouth daily.      . cetirizine (ZYRTEC) 10 MG  tablet Take 10 mg by mouth daily.      . cholecalciferol (VITAMIN D) 1000 UNITS tablet Take 1,000 Units by mouth daily.      . DULoxetine (CYMBALTA) 30 MG capsule Take 3 capsules (90 mg total) by mouth daily.  90 capsule  5  . esomeprazole (NEXIUM) 40 MG capsule Take 40 mg by mouth daily before breakfast.      . fenofibrate 160 MG tablet Take 160 mg by mouth daily. Taking 120mg  daily      . ibuprofen (ADVIL,MOTRIN) 200 MG tablet Take 800 mg by mouth every 6 (six) hours as needed.      Marland Kitchen levothyroxine (SYNTHROID, LEVOTHROID) 25 MCG tablet Take 25 mcg by mouth daily.      . metoprolol tartrate (LOPRESSOR) 25 MG tablet Take 25 mg by mouth 2 (two) times daily.      . Multiple Vitamins-Minerals (MULTIVITAMIN WITH MINERALS) tablet Take 1 tablet by mouth daily.      . temazepam (RESTORIL) 30 MG capsule Take 30 mg by mouth at bedtime as needed.      Marland Kitchen DISCONTD: hydrochlorothiazide (HYDRODIURIL) 25 MG tablet Take 1 tablet (25 mg total) by mouth daily.  30 tablet  5    Allergies  Allergen Reactions  . Chantix (Varenicline)     "Felt like having mental breakdown"  . Penicillins Swelling  . Ciprofloxacin Other (See Comments)    Muscle tightness, tendon aching and pain  . Lyrica (Pregabalin) Swelling  . Neurontin (Gabapentin) Swelling  . Ceclor (Cefaclor) Rash  . Sulfa Antibiotics Rash    Review of Systems  Review of Systems  Constitutional: Positive for malaise/fatigue. Negative for fever.  HENT: Positive for neck pain. Negative for congestion.   Eyes: Negative for discharge.  Respiratory: Negative for shortness of breath.   Cardiovascular: Negative for chest pain, palpitations and leg swelling.  Gastrointestinal: Negative for nausea, abdominal pain and diarrhea.  Genitourinary: Negative for dysuria.  Musculoskeletal: Positive for myalgias, back pain and joint pain. Negative for falls.  Skin: Negative for rash.  Neurological: Negative for loss of consciousness and headaches.    Endo/Heme/Allergies: Negative for polydipsia.  Psychiatric/Behavioral: Negative for depression and suicidal ideas. The patient is nervous/anxious. The patient does not have insomnia.     Objective  BP 137/82  Pulse 79  Temp  98 F (36.7 C) (Temporal)  Ht 5\' 5"  (1.651 m)  Wt 230 lb 1.9 oz (104.382 kg)  BMI 38.29 kg/m2  SpO2 96%  Physical Exam  Physical Exam  Constitutional: She is oriented to person, place, and time and well-developed, well-nourished, and in no distress. No distress.  HENT:  Head: Normocephalic and atraumatic.  Eyes: Conjunctivae normal are normal.  Neck: Neck supple. No thyromegaly present.  Cardiovascular: Normal rate, regular rhythm and normal heart sounds.   No murmur heard. Pulmonary/Chest: Effort normal and breath sounds normal. She has no wheezes.  Abdominal: She exhibits no distension and no mass.  Musculoskeletal: She exhibits no edema.  Lymphadenopathy:    She has no cervical adenopathy.  Neurological: She is alert and oriented to person, place, and time.  Skin: Skin is warm and dry. No rash noted. She is not diaphoretic.  Psychiatric: Memory, affect and judgment normal.    Lab Results  Component Value Date   TSH 1.31 01/21/2012   Lab Results  Component Value Date   WBC 4.7 01/21/2012   HGB 12.4 01/21/2012   HCT 37.2 01/21/2012   MCV 89.1 01/21/2012   PLT 228.0 01/21/2012   Lab Results  Component Value Date   CREATININE 1.0 01/21/2012   BUN 18 01/21/2012   NA 140 01/21/2012   K 3.6 01/21/2012   CL 102 01/21/2012   CO2 29 01/21/2012   Lab Results  Component Value Date   ALT 39* 01/21/2012   AST 26 01/21/2012   ALKPHOS 50 01/21/2012   BILITOT 0.4 01/21/2012   Lab Results  Component Value Date   CHOL 122 01/21/2012   Lab Results  Component Value Date   HDL 35.60* 01/21/2012   Lab Results  Component Value Date   LDLCALC 71 01/21/2012   Lab Results  Component Value Date   TRIG 77.0 01/21/2012   Lab Results  Component Value Date    CHOLHDL 3 01/21/2012     Assessment & Plan  HTN (hypertension) Mild elevation, discussed need to minimize caffeine and sodium, encouraged DASH diet and refill given on HCTZ today, may need to add medications in future  Hyperlipidemia Encouraged avoid trans fats and start MegaRed krill oil caps daily, rechekc lipids at next visit  Muscle spasm Given Refill on Cyclobenzaprine which she uses only qhs as needed, she agrees to try just 1/2 tab and see if she gets adequate relief, encouraged increased physical activity as well  Hyperglycemia Avoid simple carbs, one carb per meal and attempt modest weight gain. Will continue to monitor hgba1c  Fatty liver disease, nonalcoholic Very mild elevation of LFTs, reviewed old records including an abdominal ultrasound which confirms fatty liver infiltration  Anemia Resolved at present  Fibromyalgia Struggles with chronic pain uses just 1 Hydrocodone a day on average, given a refill today and reminded to stay as physically active as possible  Cancer Has MGM ordered for next week  Preventative health care Reviewed old records with patient, Tdap given in 2010, no pap for many years will schedule next visit to include a pelvic exam. Colonoscopy utd. Given flu shot today  Pain of left heel May try Aspercreme prn and follow up with orthopaedics if symptoms worsen

## 2012-01-28 NOTE — Assessment & Plan Note (Signed)
Mild elevation, discussed need to minimize caffeine and sodium, encouraged DASH diet and refill given on HCTZ today, may need to add medications in future

## 2012-01-31 ENCOUNTER — Ambulatory Visit
Admission: RE | Admit: 2012-01-31 | Discharge: 2012-01-31 | Disposition: A | Discharge status: Home / Self Care | Payer: 59 | Source: Ambulatory Visit | Attending: Family Medicine | Admitting: Family Medicine

## 2012-01-31 DIAGNOSIS — Z1231 Encounter for screening mammogram for malignant neoplasm of breast: Secondary | ICD-10-CM

## 2012-02-22 ENCOUNTER — Other Ambulatory Visit: Payer: Self-pay

## 2012-02-22 MED ORDER — ALPRAZOLAM 0.25 MG PO TABS: 0.2500 mg | ORAL_TABLET | Freq: Every evening | ORAL | Status: DC | PRN

## 2012-02-22 NOTE — Telephone Encounter (Signed)
Faxed to pharmacy

## 2012-02-22 NOTE — Telephone Encounter (Signed)
Please advise refill? Last RX wrote on 01-04-12 quantity 30 with 0 refills.  If ok to refill fax to [phone]

## 2012-03-28 ENCOUNTER — Other Ambulatory Visit: Payer: Self-pay

## 2012-03-28 MED ORDER — ALPRAZOLAM 0.25 MG PO TABS: 0.2500 mg | ORAL_TABLET | Freq: Every evening | ORAL | Status: DC | PRN

## 2012-03-28 NOTE — Telephone Encounter (Signed)
RX faxed

## 2012-03-28 NOTE — Telephone Encounter (Signed)
Please advise refill? Last RX was wrote on 02-22-12. Quantity 30 with 0 refills.  If ok fax to [phone]

## 2012-04-27 ENCOUNTER — Other Ambulatory Visit: Payer: Self-pay | Admitting: Family Medicine

## 2012-04-28 ENCOUNTER — Other Ambulatory Visit: Payer: Self-pay

## 2012-04-28 MED ORDER — TEMAZEPAM 30 MG PO CAPS: 30.0000 mg | ORAL_CAPSULE | Freq: Every evening | ORAL | Status: DC | PRN

## 2012-04-28 NOTE — Telephone Encounter (Signed)
Please advise Alprazolam refill? Last RX wrote on 03-28-12 quantity 30 with 0 refills.   If ok fax to [phone]

## 2012-04-28 NOTE — Telephone Encounter (Signed)
Please advise Temazepam refill?   If ok fax to [phone]

## 2012-04-28 NOTE — Telephone Encounter (Signed)
Faxed to pharmacy

## 2012-04-29 NOTE — Telephone Encounter (Signed)
Faxed to pharmacy

## 2012-05-13 ENCOUNTER — Other Ambulatory Visit: Payer: Self-pay | Admitting: Family Medicine

## 2012-05-13 NOTE — Telephone Encounter (Signed)
RX faxed

## 2012-05-13 NOTE — Telephone Encounter (Signed)
Please advise refill? Last RX was wrote on 01-28-12 quantity 30 with 1 refill.

## 2012-05-31 ENCOUNTER — Other Ambulatory Visit: Payer: Self-pay | Admitting: Family Medicine

## 2012-06-02 NOTE — Telephone Encounter (Signed)
Please advise refills? Flexeril last RX wrote on 01-28-12 quantity 30 with 2 refills Xanax last RX wrote on 04-27-12 quantity 30 with 0 refills  If ok fax to [phone]

## 2012-06-06 ENCOUNTER — Ambulatory Visit (INDEPENDENT_AMBULATORY_CARE_PROVIDER_SITE_OTHER): Payer: 59 | Admitting: Family Medicine

## 2012-06-06 ENCOUNTER — Encounter: Payer: Self-pay | Admitting: Family Medicine

## 2012-06-06 VITALS — BP 140/84 | HR 94 | Temp 98.2°F | Ht 65.0 in | Wt 228.0 lb

## 2012-06-06 DIAGNOSIS — IMO0001 Reserved for inherently not codable concepts without codable children: Secondary | ICD-10-CM

## 2012-06-06 DIAGNOSIS — M7918 Myalgia, other site: Secondary | ICD-10-CM

## 2012-06-06 NOTE — Progress Notes (Signed)
OFFICE NOTE  06/06/2012  CC:  Chief Complaint  Patient presents with  . Cough    left ear pain began today, swollen gland on left side of neck began yesterday     HPI: Patient is a 62 y.o. Caucasian female who is here for pain x 24h or so under left jaw line, some ear aching on and off today.  Chronic smokers cough, mildly worse x 1 wk.  No fever.  No ST.  Some HA's--mild, "nothing unusual". No known sick contacts.  Applied moist heat and took ibuprofen last night as well as a vicodin.  Not hurting as bad today at all.  No teeth bothering her.  Has hx of cervical spine arthritis and muscle pain in various regions of neck in the past.  This pain was made worse by certain neck motions, also alleviated by other certain neck motions.  She has not palpated any swelling or mass in the area of pain but it was notably tender yesterday when she pressed on the area.  Pertinent PMH:  Past Medical History  Diagnosis Date  . Sleep apnea     on CPAP  . Osteopenia   . Fatty liver disease, nonalcoholic   . Obesity   . History of blood clots 2004    during cancer treatment  . Anxiety   . Osteoarthritis   . DDD (degenerative disc disease)   . DJD (degenerative joint disease)   . Cancer 2004    ductal carcinoma; lumpectomy  . Depression   . GERD (gastroesophageal reflux disease)   . Fibromyalgia   . Peripheral neuropathy   . Muscle spasm 12/30/2011  . Anemia 01/28/2012  . HTN (hypertension) 01/28/2012  . Hyperlipidemia 01/28/2012  . Hyperglycemia 01/28/2012  . Preventative health care 01/28/2012  . Pain of left heel 01/28/2012    MEDS:  Outpatient Prescriptions Prior to Visit  Medication Sig Dispense Refill  . ALPRAZolam (XANAX) 0.25 MG tablet TAKE 1 TABLET BY MOUTH AT BEDTIME  30 tablet  0  . calcium carbonate (OS-CAL) 600 MG TABS Take 1,800 mg by mouth daily.      . cetirizine (ZYRTEC) 10 MG tablet Take 10 mg by mouth daily.      . cholecalciferol (VITAMIN D) 1000 UNITS tablet Take 1,000  Units by mouth daily.      . cyclobenzaprine (FLEXERIL) 10 MG tablet TAKE 1 TABLET (10 MG TOTAL) BY MOUTH 2 (TWO) TIMES DAILY AS NEEDED.  30 tablet  2  . DULoxetine (CYMBALTA) 30 MG capsule Take 3 capsules (90 mg total) by mouth daily.  90 capsule  5  . esomeprazole (NEXIUM) 40 MG capsule Take 40 mg by mouth daily before breakfast.      . fenofibrate 160 MG tablet Take 160 mg by mouth daily. Taking 120mg  daily      . hydrochlorothiazide (HYDRODIURIL) 25 MG tablet Take 1 tablet (25 mg total) by mouth daily.  30 tablet  5  . HYDROcodone-acetaminophen (VICODIN) 5-500 MG per tablet TAKE 1 TABLET BY MOUTH EVERY 6 HOURS AS NEEDED  30 tablet  1  . ibuprofen (ADVIL,MOTRIN) 200 MG tablet Take 800 mg by mouth every 6 (six) hours as needed.      Marland Kitchen levothyroxine (SYNTHROID, LEVOTHROID) 25 MCG tablet Take 25 mcg by mouth daily.      . metoprolol tartrate (LOPRESSOR) 25 MG tablet Take 25 mg by mouth 2 (two) times daily.      . Multiple Vitamins-Minerals (MULTIVITAMIN WITH MINERALS) tablet Take 1  tablet by mouth daily.      . temazepam (RESTORIL) 30 MG capsule Take 1 capsule (30 mg total) by mouth at bedtime as needed.  30 capsule  1  . Krill Oil CAPS 1 cap po daily Try MegaRed by Schiff       Last reviewed on 06/06/2012  3:36 PM by Jeoffrey Massed, MD  PE: Blood pressure 140/84, pulse 94, temperature 98.2 F (36.8 C), temperature source Temporal, height 5\' 5"  (1.651 m), weight 228 lb (103.42 kg), SpO2 98.00%. Gen: Alert, well appearing.  Patient is oriented to person, place, time, and situation. AFFECT: pleasant, lucid thought and speech. ENT: Ears: EACs clear, normal epithelium.  TMs with good light reflex and landmarks bilaterally.  Eyes: no injection, icteris, swelling, or exudate.  EOMI, PERRLA.  TMJ's without tenderness or subluxation. Nose: no drainage or turbinate edema/swelling.  No injection or focal lesion.  Mouth: lips without lesion/swelling.  Oral mucosa pink and moist.  Dentition intact and  without obvious caries or gingival swelling.  Oropharynx without erythema, exudate, or swelling.  Neck - No masses or thyromegaly or limitation in range of motion   IMPRESSION AND PLAN:  Myofascial neck pain (left SCM region); improving with pt's measures at home: heat, ROM, NSAID, prn vicodin. Reassured pt that I could find no abnormality on exam today.  FOLLOW UP: prn

## 2012-06-08 DIAGNOSIS — M7918 Myalgia, other site: Secondary | ICD-10-CM | POA: Insufficient documentation

## 2012-06-21 ENCOUNTER — Other Ambulatory Visit: Payer: Self-pay | Admitting: Family Medicine

## 2012-06-27 ENCOUNTER — Other Ambulatory Visit: Payer: Self-pay | Admitting: *Deleted

## 2012-06-27 NOTE — Telephone Encounter (Signed)
Pt called requesting refill on HYDROCODONE with new, lower acetaminophen dose.  Pt states she has one refill left at pharmacy.  Wanted to know if she should use that refill or have another called in today.  Advised to request refill from pharmacy and if they had any questions regarding dose or were unable to fill at that dose they should call us. She is agreeable and will request refill at pharmacy.

## 2012-06-30 ENCOUNTER — Telehealth: Payer: Self-pay | Admitting: *Deleted

## 2012-06-30 ENCOUNTER — Telehealth: Payer: Self-pay

## 2012-06-30 MED ORDER — HYDROCODONE-ACETAMINOPHEN 5-500 MG PO TABS: 1.0000 | ORAL_TABLET | Freq: Four times a day (QID) | ORAL | Status: DC | PRN

## 2012-06-30 MED ORDER — HYDROCODONE-ACETAMINOPHEN 5-325 MG PO TABS: 1.0000 | ORAL_TABLET | Freq: Four times a day (QID) | ORAL | Status: DC | PRN

## 2012-06-30 NOTE — Telephone Encounter (Signed)
OK to switch to 5/325, see previous note for instructions

## 2012-06-30 NOTE — Telephone Encounter (Signed)
Amber from [facility] called stating that they no longer make Vicodin 5-500. Amber stated that they can do 5-325 or 5-300 mg? Please advise? Callback number [phone]

## 2012-06-30 NOTE — Telephone Encounter (Signed)
OK to change to Norco 5/325, use same sig, as previous Hydrocodone tab. Disp same # with 0 rf

## 2012-06-30 NOTE — Telephone Encounter (Signed)
Hydrocodone filled with no refills

## 2012-06-30 NOTE — Telephone Encounter (Signed)
RX request from pt/pharmacy for HYDROCODONE Last filled - 05/13/12, #30 x 1, pt has remaining refill at pharmacy, but pharmacy will not release due to APAP dose.   Needs HYDROCODONE 5-325 instead. Please advise.  [facility] - OR fax [phone]

## 2012-07-16 ENCOUNTER — Encounter: Payer: Self-pay | Admitting: Family Medicine

## 2012-07-16 ENCOUNTER — Ambulatory Visit (INDEPENDENT_AMBULATORY_CARE_PROVIDER_SITE_OTHER): Payer: 59 | Admitting: Family Medicine

## 2012-07-16 VITALS — BP 148/83 | HR 76 | Temp 99.6°F | Ht 65.0 in | Wt 234.4 lb

## 2012-07-16 DIAGNOSIS — E785 Hyperlipidemia, unspecified: Secondary | ICD-10-CM

## 2012-07-16 DIAGNOSIS — G609 Hereditary and idiopathic neuropathy, unspecified: Secondary | ICD-10-CM

## 2012-07-16 DIAGNOSIS — M549 Dorsalgia, unspecified: Secondary | ICD-10-CM

## 2012-07-16 DIAGNOSIS — G629 Polyneuropathy, unspecified: Secondary | ICD-10-CM

## 2012-07-16 DIAGNOSIS — E039 Hypothyroidism, unspecified: Secondary | ICD-10-CM

## 2012-07-16 DIAGNOSIS — R300 Dysuria: Secondary | ICD-10-CM

## 2012-07-16 DIAGNOSIS — K219 Gastro-esophageal reflux disease without esophagitis: Secondary | ICD-10-CM

## 2012-07-16 DIAGNOSIS — I10 Essential (primary) hypertension: Secondary | ICD-10-CM

## 2012-07-16 DIAGNOSIS — F4323 Adjustment disorder with mixed anxiety and depressed mood: Secondary | ICD-10-CM

## 2012-07-16 DIAGNOSIS — R319 Hematuria, unspecified: Secondary | ICD-10-CM

## 2012-07-16 LAB — POCT URINALYSIS DIPSTICK
Protein, UA: NEGATIVE
Spec Grav, UA: 1.01
Urobilinogen, UA: 0.2

## 2012-07-16 MED ORDER — METHYLPREDNISOLONE ACETATE 40 MG/ML IJ SUSP
40.0000 mg | Freq: Once | INTRAMUSCULAR | Status: AC
  Administered 2012-07-16: 40 mg via INTRAMUSCULAR

## 2012-07-16 MED ORDER — METOPROLOL TARTRATE 25 MG PO TABS: 25.0000 mg | ORAL_TABLET | Freq: Two times a day (BID) | ORAL | Status: DC

## 2012-07-16 MED ORDER — METHYLPREDNISOLONE 4 MG PO KIT: PACK | ORAL | Status: DC

## 2012-07-16 MED ORDER — HYDROCODONE-ACETAMINOPHEN 7.5-325 MG PO TABS: 1.0000 | ORAL_TABLET | Freq: Four times a day (QID) | ORAL | Status: DC | PRN

## 2012-07-16 MED ORDER — METHOCARBAMOL 500 MG PO TABS: 500.0000 mg | ORAL_TABLET | Freq: Three times a day (TID) | ORAL | Status: DC | PRN

## 2012-07-16 MED ORDER — LEVOTHYROXINE SODIUM 25 MCG PO TABS: 25.0000 ug | ORAL_TABLET | Freq: Every day | ORAL | Status: DC

## 2012-07-16 MED ORDER — ALPRAZOLAM 0.25 MG PO TABS: 0.2500 mg | ORAL_TABLET | Freq: Every evening | ORAL | Status: DC | PRN

## 2012-07-16 NOTE — Patient Instructions (Addendum)
Back Pain, Adult Low back pain is very common. About 1 in 5 people have back pain.The cause of low back pain is rarely dangerous. The pain often gets better over time.About half of people with a sudden onset of back pain feel better in just 2 weeks. About 8 in 10 people feel better by 6 weeks.  CAUSES Some common causes of back pain include:  Strain of the muscles or ligaments supporting the spine.  Wear and tear (degeneration) of the spinal discs.  Arthritis.  Direct injury to the back. DIAGNOSIS Most of the time, the direct cause of low back pain is not known.However, back pain can be treated effectively even when the exact cause of the pain is unknown.Answering your caregiver's questions about your overall health and symptoms is one of the most accurate ways to make sure the cause of your pain is not dangerous. If your caregiver needs more information, he or she may order lab work or imaging tests (X-rays or MRIs).However, even if imaging tests show changes in your back, this usually does not require surgery. HOME CARE INSTRUCTIONS For many people, back pain returns.Since low back pain is rarely dangerous, it is often a condition that people can learn to manageon their own.   Remain active. It is stressful on the back to sit or stand in one place. Do not sit, drive, or stand in one place for more than 30 minutes at a time. Take short walks on level surfaces as soon as pain allows.Try to increase the length of time you walk each day.  Do not stay in bed.Resting more than 1 or 2 days can delay your recovery.  Do not avoid exercise or work.Your body is made to move.It is not dangerous to be active, even though your back may hurt.Your back will likely heal faster if you return to being active before your pain is gone.  Pay attention to your body when you bend and lift. Many people have less discomfortwhen lifting if they bend their knees, keep the load close to their bodies,and  avoid twisting. Often, the most comfortable positions are those that put less stress on your recovering back.  Find a comfortable position to sleep. Use a firm mattress and lie on your side with your knees slightly bent. If you lie on your back, put a pillow under your knees.  Only take over-the-counter or prescription medicines as directed by your caregiver. Over-the-counter medicines to reduce pain and inflammation are often the most helpful.Your caregiver may prescribe muscle relaxant drugs.These medicines help dull your pain so you can more quickly return to your normal activities and healthy exercise.  Put ice on the injured area.  Put ice in a plastic bag.  Place a towel between your skin and the bag.  Leave the ice on for 15 to 20 minutes, 3 to 4 times a day for the first 2 to 3 days. After that, ice and heat may be alternated to reduce pain and spasms.  Ask your caregiver about trying back exercises and gentle massage. This may be of some benefit.  Avoid feeling anxious or stressed.Stress increases muscle tension and can worsen back pain.It is important to recognize when you are anxious or stressed and learn ways to manage it.Exercise is a great option. SEEK MEDICAL CARE IF:  You have pain that is not relieved with rest or medicine.  You have pain that does not improve in 1 week.  You have new symptoms.  You are generally   not feeling well. SEEK IMMEDIATE MEDICAL CARE IF:   You have pain that radiates from your back into your legs.  You develop new bowel or bladder control problems.  You have unusual weakness or numbness in your arms or legs.  You develop nausea or vomiting.  You develop abdominal pain.  You feel faint. Document Released: 04/23/2005 Document Revised: 10/23/2011 Document Reviewed: 09/11/2010 [employer] Patient Information 2013 [employer].  

## 2012-07-17 ENCOUNTER — Telehealth: Payer: Self-pay

## 2012-07-17 NOTE — Telephone Encounter (Signed)
Patient left a message stating she is having trouble with urgency today? Please advise?

## 2012-07-17 NOTE — Telephone Encounter (Signed)
Pt informed and would like to wait for the antibiotic after the results come back.

## 2012-07-17 NOTE — Telephone Encounter (Signed)
Urine did not look highly suspicious but I did send it for culture, results still pending. Recommend she increase hydration, take probiotics and we can give her Macrobid bid x 3 days due to her symptoms

## 2012-07-18 LAB — URINE CULTURE: Colony Count: NO GROWTH

## 2012-07-19 DIAGNOSIS — G629 Polyneuropathy, unspecified: Secondary | ICD-10-CM | POA: Insufficient documentation

## 2012-07-19 NOTE — Assessment & Plan Note (Signed)
Controlled on Esomeprazole

## 2012-07-19 NOTE — Assessment & Plan Note (Signed)
Worsening pain in low back x 8 days after lifting a grill also notes some radicular symptoms in right leg. Given a steroid shot and started on Medrol dosepak. May restart Meloxicam after done with medrol. May use muscle relaxer and hydrocodone prn

## 2012-07-19 NOTE — Assessment & Plan Note (Signed)
Tolerating Fenofibrate daily, needs MegaRed caps daily. Avoid trans fats, minimize simple carbs and saturated fats.

## 2012-07-19 NOTE — Assessment & Plan Note (Addendum)
No new medications specifically for this at this time. Will continue to monitor

## 2012-07-19 NOTE — Progress Notes (Signed)
Patient ID: Susan [name], female   DOB: June 19, 1950, 62 y.o.   MRN: [id number] Susan [name] [id number] 07-Jun-1950 07/19/2012      Progress Note-Follow Up  Subjective  Chief Complaint  Chief Complaint  Patient presents with  . Back Pain    lower back pain, right hip and right leg pain X 8 days- moved charcoal grill    HPI  Is a 62 year old Caucasian female in today with numerous complaints. She's complaining of 8 days now worth of worsening lower back pain with some right lower extremity radiculopathy. She reports knee as well as hip pain. She has a burning sensation which radiates down the lateral side of her leg. She does acknowledge she was moving a heavy grill) started. She has been using a lot skin and hydrocodone with minimal effect. Pain is worse with standing. She has been seen by her orthotopic ago and and had both of her knees x-rayed because her other knee bothers her intermittently as well not at this time. Was told this was unremarkable. She's been using her Flexeril as well and finds this to be particularly helpful. Has had a knee replacement the past. Is also complaining of urinary burning. No frequency or urgency. No incontinence or hematuria. No GI disturbances noted. No acute illness. No fevers or chills, shortness of breath or chest pain Past Medical History  Diagnosis Date  . Sleep apnea     on CPAP  . Osteopenia   . Fatty liver disease, nonalcoholic   . Obesity   . History of blood clots 2004    during cancer treatment  . Anxiety   . Osteoarthritis   . DDD (degenerative disc disease)   . DJD (degenerative joint disease)   . Cancer 2004    ductal carcinoma; lumpectomy  . Depression   . GERD (gastroesophageal reflux disease)   . Fibromyalgia   . Peripheral neuropathy   . Muscle spasm 12/30/2011  . Anemia 01/28/2012  . HTN (hypertension) 01/28/2012  . Hyperlipidemia 01/28/2012  . Hyperglycemia 01/28/2012  . Preventative health care 01/28/2012  . Pain of  left heel 01/28/2012    Past Surgical History  Procedure Laterality Date  . Breast surgery  2004    lumpectomy  . Joint replacement      right total knee  . Bilateral oophorectomy  01/2003  . Nasal septum surgery    . Carpal tunnel release      right  . Abdominal hysterectomy  1991  . Thyroidectomy, partial  mid 80's    right side    Family History  Problem Relation Age of Onset  . Cancer Mother     liver cancer, hep c  . Heart disease Mother     chf  . Hypertension Mother   . Hyperlipidemia Mother   . Osteoporosis Mother   . Cancer Father     lung  . COPD Father   . Cancer Sister     breast, non Hodgkin's Lymphoma  . Heart disease Sister     mvp  . Hypertension Brother   . Benign prostatic hyperplasia Brother   . Arthritis Brother   . Other Daughter     Beal's Syndrome  . Other Son     Beal's Syndrome  . Vision loss Maternal Grandmother   . Dementia Maternal Grandmother   . Vision loss Paternal Grandfather   . Hypertension Brother   . Benign prostatic hyperplasia Brother   . Cancer Brother     prostate  .  Hypertension Brother   . COPD Brother   . Cancer Brother     prostate  . Hypertension Brother   . Hyperlipidemia Brother   . Cancer Brother     prostate  . Mental illness Brother     schizophrenia  . Cirrhosis Brother     hep c  . Hypertension Sister   . Other Sister     thalessemia, anemia  . Hyperlipidemia Sister   . Gout Sister   . Arthritis Daughter   . Other Daughter     overweight    History   Social History  . Marital Status: Married    Spouse Name: N/A    Number of Children: N/A  . Years of Education: N/A   Occupational History  . Not on file.   Social History Main Topics  . Smoking status: Current Every Day Smoker -- 0.50 packs/day    Types: Cigarettes  . Smokeless tobacco: Never Used  . Alcohol Use: No  . Drug Use: No  . Sexually Active: Not on file   Other Topics Concern  . Not on file   Social History Narrative   . No narrative on file    Current Outpatient Prescriptions on File Prior to Visit  Medication Sig Dispense Refill  . calcium carbonate (OS-CAL) 600 MG TABS Take 1,800 mg by mouth daily.      . cetirizine (ZYRTEC) 10 MG tablet Take 10 mg by mouth daily.      . cholecalciferol (VITAMIN D) 1000 UNITS tablet Take 1,000 Units by mouth daily.      . cyclobenzaprine (FLEXERIL) 10 MG tablet TAKE 1 TABLET (10 MG TOTAL) BY MOUTH 2 (TWO) TIMES DAILY AS NEEDED.  30 tablet  2  . DULoxetine (CYMBALTA) 30 MG capsule Take 3 capsules (90 mg total) by mouth daily.  90 capsule  5  . esomeprazole (NEXIUM) 40 MG capsule Take 40 mg by mouth daily before breakfast.      . fenofibrate 160 MG tablet Take 160 mg by mouth daily. Taking 120mg  daily      . hydrochlorothiazide (HYDRODIURIL) 25 MG tablet Take 1 tablet (25 mg total) by mouth daily.  30 tablet  5  . Multiple Vitamins-Minerals (MULTIVITAMIN WITH MINERALS) tablet Take 1 tablet by mouth daily.      . temazepam (RESTORIL) 30 MG capsule Take 1 capsule (30 mg total) by mouth at bedtime as needed.  30 capsule  1   No current facility-administered medications on file prior to visit.    Allergies  Allergen Reactions  . Chantix (Varenicline)     "Felt like having mental breakdown"  . Penicillins Swelling  . Ciprofloxacin Other (See Comments)    Muscle tightness, tendon aching and pain  . Lyrica (Pregabalin) Swelling  . Neurontin (Gabapentin) Swelling  . Ceclor (Cefaclor) Rash  . Sulfa Antibiotics Rash    Review of Systems  Review of Systems  Constitutional: Negative for fever and malaise/fatigue.  HENT: Negative for congestion.   Eyes: Negative for discharge.  Respiratory: Negative for shortness of breath.   Cardiovascular: Negative for chest pain, palpitations and leg swelling.  Gastrointestinal: Negative for nausea, abdominal pain and diarrhea.  Genitourinary: Negative for dysuria.  Musculoskeletal: Negative for falls.  Skin: Negative for rash.   Neurological: Negative for loss of consciousness and headaches.  Endo/Heme/Allergies: Negative for polydipsia.  Psychiatric/Behavioral: Negative for depression and suicidal ideas. The patient is not nervous/anxious and does not have insomnia.     Objective  BP  148/83  Pulse 76  Temp(Src) 99.6 F (37.6 C) (Temporal)  Ht 5\' 5"  (1.651 m)  Wt 234 lb 6.4 oz (106.323 kg)  BMI 39.01 kg/m2  SpO2 96%  Physical Exam  Physical Exam  Constitutional: She is oriented to person, place, and time and well-developed, well-nourished, and in no distress. No distress.  HENT:  Head: Normocephalic and atraumatic.  Eyes: Conjunctivae are normal.  Neck: Neck supple. No thyromegaly present.  Cardiovascular: Normal rate, regular rhythm and normal heart sounds.   No murmur heard. Pulmonary/Chest: Effort normal and breath sounds normal. She has no wheezes.  Abdominal: Soft. Bowel sounds are normal. She exhibits no distension and no mass.  Musculoskeletal: She exhibits tenderness. She exhibits no edema.  Pain with palpation over right sacroiliac joint.  Lymphadenopathy:    She has no cervical adenopathy.  Neurological: She is alert and oriented to person, place, and time.  Skin: Skin is warm and dry. No rash noted. She is not diaphoretic.  Psychiatric: Memory, affect and judgment normal.    Lab Results  Component Value Date   TSH 1.31 01/21/2012   Lab Results  Component Value Date   WBC 4.7 01/21/2012   HGB 12.4 01/21/2012   HCT 37.2 01/21/2012   MCV 89.1 01/21/2012   PLT 228.0 01/21/2012   Lab Results  Component Value Date   CREATININE 1.0 01/21/2012   BUN 18 01/21/2012   NA 140 01/21/2012   K 3.6 01/21/2012   CL 102 01/21/2012   CO2 29 01/21/2012   Lab Results  Component Value Date   ALT 39* 01/21/2012   AST 26 01/21/2012   ALKPHOS 50 01/21/2012   BILITOT 0.4 01/21/2012   Lab Results  Component Value Date   CHOL 122 01/21/2012   Lab Results  Component Value Date   HDL 35.60* 01/21/2012    Lab Results  Component Value Date   LDLCALC 71 01/21/2012   Lab Results  Component Value Date   TRIG 77.0 01/21/2012   Lab Results  Component Value Date   CHOLHDL 3 01/21/2012     Assessment & Plan  HTN (hypertension) Will add Metoprolol XR 25 mg daily, Minimize sodium and caffeine  Hyperlipidemia Tolerating Fenofibrate daily, needs MegaRed caps daily. Avoid trans fats, minimize simple carbs and saturated fats.  GERD (gastroesophageal reflux disease) Controlled on Esomeprazole  Peripheral neuropathy No new medications specifically for this at this time. Will continue to monitor  DDD (degenerative disc disease) Worsening pain in low back x 8 days after lifting a grill also notes some radicular symptoms in right leg. Given a steroid shot and started on Medrol dosepak. May restart Meloxicam after done with medrol. May use muscle relaxer and hydrocodone prn  Dysuria Urine just shows trace hematuria, will repeat UA in future, increase hydration for now and report worsening symptoms

## 2012-07-19 NOTE — Assessment & Plan Note (Signed)
Will add Metoprolol XR 25 mg daily, Minimize sodium and caffeine

## 2012-07-20 DIAGNOSIS — R300 Dysuria: Secondary | ICD-10-CM | POA: Insufficient documentation

## 2012-07-20 NOTE — Assessment & Plan Note (Signed)
Urine just shows trace hematuria, will repeat UA in future, increase hydration for now and report worsening symptoms

## 2012-07-22 ENCOUNTER — Telehealth: Payer: Self-pay

## 2012-07-22 DIAGNOSIS — M549 Dorsalgia, unspecified: Secondary | ICD-10-CM

## 2012-07-22 MED ORDER — METHYLPREDNISOLONE 4 MG PO KIT: PACK | ORAL | Status: DC

## 2012-07-22 NOTE — Telephone Encounter (Signed)
Pt left a message stating that her back was starting to get better but now its getting bad again? Please advise?

## 2012-07-22 NOTE — Telephone Encounter (Signed)
Likely worse because her steroid pak has run out. Give her one refill on the Medrol dosepak and if still no improvement needs to return or we can refer to ortho

## 2012-07-22 NOTE — Telephone Encounter (Signed)
Pt informed and voiced understanding

## 2012-07-24 ENCOUNTER — Telehealth: Payer: Self-pay | Admitting: Family Medicine

## 2012-07-24 ENCOUNTER — Other Ambulatory Visit: Payer: Self-pay | Admitting: Family Medicine

## 2012-07-24 DIAGNOSIS — M549 Dorsalgia, unspecified: Secondary | ICD-10-CM

## 2012-07-24 NOTE — Telephone Encounter (Signed)
Please advise 

## 2012-07-24 NOTE — Telephone Encounter (Signed)
Patient is at dr Glean Hess office right now she stated.

## 2012-07-24 NOTE — Telephone Encounter (Signed)
Patient left a message on my machine stating she is supposed to see her grandchildren in [place] next week and hasn't seen them in a year and would like to go to a specialist that will give her an injection in her spine if that's what she needs?

## 2012-07-24 NOTE — Telephone Encounter (Signed)
OK Ialready approved Dr Darrick Penna but I do not know him so I cannot speak to what he is willing to do. She may want to ask his office about if he does epidural injections. We also could use Dr Winfield Cunas without being an established patient I do not know how quickly I could get her in and treated before she needs to leave but we could try?

## 2012-07-24 NOTE — Telephone Encounter (Signed)
Patient seen last week for back pain,   Pain is worse and would like referral to [facility]  Dr Judith Blonder, (has seen him in past)

## 2012-08-26 ENCOUNTER — Telehealth: Payer: Self-pay | Admitting: Family Medicine

## 2012-08-26 DIAGNOSIS — E039 Hypothyroidism, unspecified: Secondary | ICD-10-CM

## 2012-08-26 DIAGNOSIS — I10 Essential (primary) hypertension: Secondary | ICD-10-CM

## 2012-08-26 MED ORDER — LEVOTHYROXINE SODIUM 25 MCG PO TABS: 25.0000 ug | ORAL_TABLET | Freq: Every day | ORAL | Status: DC

## 2012-08-26 MED ORDER — FENOFIBRATE 160 MG PO TABS: 160.0000 mg | ORAL_TABLET | Freq: Every day | ORAL | Status: DC

## 2012-08-26 MED ORDER — METOPROLOL TARTRATE 25 MG PO TABS: 25.0000 mg | ORAL_TABLET | Freq: Two times a day (BID) | ORAL | Status: DC

## 2012-08-26 NOTE — Telephone Encounter (Signed)
Refill- metoprolol tartrate 25mg  tablet. Take one tablet by mouth twice a day. Qty 60 last fill 3.21.14  Refill- fenoglide 120mg  tablet. Take one tablet by mouth every day. Qty 30 last fill 3.21.14  Refill- levothyroxine tablet. Take one tablet by mouth every day. Qty 30 last fill 3.21.14

## 2012-08-29 ENCOUNTER — Encounter: Payer: Self-pay | Admitting: Family Medicine

## 2012-08-29 ENCOUNTER — Ambulatory Visit (INDEPENDENT_AMBULATORY_CARE_PROVIDER_SITE_OTHER): Payer: 59 | Admitting: Family Medicine

## 2012-08-29 VITALS — BP 155/81 | HR 77 | Temp 99.9°F | Ht 65.0 in | Wt 226.8 lb

## 2012-08-29 DIAGNOSIS — H6590 Unspecified nonsuppurative otitis media, unspecified ear: Secondary | ICD-10-CM

## 2012-08-29 DIAGNOSIS — H6593 Unspecified nonsuppurative otitis media, bilateral: Secondary | ICD-10-CM

## 2012-08-29 DIAGNOSIS — I10 Essential (primary) hypertension: Secondary | ICD-10-CM

## 2012-08-29 DIAGNOSIS — J339 Nasal polyp, unspecified: Secondary | ICD-10-CM

## 2012-08-29 MED ORDER — METOPROLOL TARTRATE 50 MG PO TABS: 50.0000 mg | ORAL_TABLET | Freq: Two times a day (BID) | ORAL | Status: DC

## 2012-08-29 NOTE — Patient Instructions (Signed)
Icool Probiotic such as Digestive Advantage or generic daily Krill oil such as MegaRed caps 1 daily   Serous Otitis Media  Serous otitis media is also known as otitis media with effusion (OME). It means there is fluid in the middle ear space. This space contains the bones for hearing and air. Air in the middle ear space helps to transmit sound.  The air gets there through the eustachian tube. This tube goes from the back of the throat to the middle ear space. It keeps the pressure in the middle ear the same as the outside world. It also helps to drain fluid from the middle ear space. CAUSES  OME occurs when the eustachian tube gets blocked. Blockage can come from:  Ear infections.  Colds and other upper respiratory infections.  Allergies.  Irritants such as cigarette smoke.  Sudden changes in air pressure (such as descending in an airplane).  Enlarged adenoids. During colds and upper respiratory infections, the middle ear space can become temporarily filled with fluid. This can happen after an ear infection also. Once the infection clears, the fluid will generally drain out of the ear through the eustachian tube. If it does not, then OME occurs. SYMPTOMS   Hearing loss.  A feeling of fullness in the ear  but no pain.  Young children may not show any symptoms. DIAGNOSIS   Diagnosis of OME is made by an ear exam.  Tests may be done to check on the movement of the eardrum.  Hearing exams may be done. TREATMENT   The fluid most often goes away without treatment.  If allergy is the cause, allergy treatment may be helpful.  Fluid that persists for several months may require minor surgery. A small tube is placed in the ear drum to:  Drain the fluid.  Restore the air in the middle ear space.  In certain situations, antibiotics are used to avoid surgery.  Surgery may be done to remove enlarged adenoids (if this is the cause). HOME CARE INSTRUCTIONS   Keep children away  from tobacco smoke.  Be sure to keep follow up appointments, if any. SEEK MEDICAL CARE IF:   Hearing is not better in 3 months.  Hearing is worse.  Ear pain.  Drainage from the ear.  Dizziness. Document Released: 07/14/2003 Document Revised: 07/16/2011 Document Reviewed: 05/13/2008 [facility] Patient Information 2013 [place], [place].

## 2012-08-30 ENCOUNTER — Encounter: Payer: Self-pay | Admitting: Family Medicine

## 2012-08-30 DIAGNOSIS — J339 Nasal polyp, unspecified: Secondary | ICD-10-CM

## 2012-08-30 DIAGNOSIS — H6590 Unspecified nonsuppurative otitis media, unspecified ear: Secondary | ICD-10-CM | POA: Insufficient documentation

## 2012-08-30 HISTORY — DX: Nasal polyp, unspecified: J33.9

## 2012-08-30 NOTE — Assessment & Plan Note (Signed)
Increase metoprolol to 50 mg po bid and reassess at next visit.

## 2012-08-30 NOTE — Assessment & Plan Note (Signed)
Patient with recent OM and now with [facility] referred to ENT for further consideration and evaluation of her nasal polyp

## 2012-08-30 NOTE — Progress Notes (Signed)
Patient ID: Susan [name], female   DOB: 13-Mar-1951, 62 y.o.   MRN: [id number] Susan [name] [id number] 09/24/1950 08/30/2012      Progress Note-Follow Up  Subjective  Chief Complaint  Chief Complaint  Patient presents with  . Otitis Media    right ear- took zpak  . Nasal Polyps    in right nostril- [facility] found    HPI  Patient is a 62 year old Caucasian female in today for followup of an urgent care visit. She was seen to 2 weeks ago at a walk-in clinic and diagnosed with an ear infection and told she had significant nasal polyp on the right side. She notes that her congestion is improving but not gone. She still has intermittent sense of fevers and headaches as well as nasal congestion. She did have a nasal septoplasty years ago. Presently has severe [name] but it is mild more pressure. Has a ringing in her ears and echoing sensation in her left ear. No chest [name] or palpitations no shortness or breath GI or GU complaints noted.  Past Medical History  Diagnosis Date  . Sleep apnea     on CPAP  . Osteopenia   . Fatty liver disease, nonalcoholic   . Obesity   . History of blood clots 2004    during cancer treatment  . Anxiety   . Osteoarthritis   . DDD (degenerative disc disease)   . DJD (degenerative joint disease)   . Cancer 2004    ductal carcinoma; lumpectomy  . Depression   . GERD (gastroesophageal reflux disease)   . Fibromyalgia   . Peripheral neuropathy   . Muscle spasm 12/30/2011  . Anemia 01/28/2012  . HTN (hypertension) 01/28/2012  . Hyperlipidemia 01/28/2012  . Hyperglycemia 01/28/2012  . Preventative health care 01/28/2012  . [name] of left heel 01/28/2012  . Nasal polyp 08/30/2012    Past Surgical History  Procedure Laterality Date  . Breast surgery  2004    lumpectomy  . Joint replacement      right total knee  . Bilateral oophorectomy  01/2003  . Nasal septum surgery    . Carpal tunnel release      right  . Abdominal hysterectomy  1991   . Thyroidectomy, partial  mid 80's    right side    Family History  Problem Relation Age of Onset  . Cancer Mother     liver cancer, hep c  . Heart disease Mother     chf  . Hypertension Mother   . Hyperlipidemia Mother   . Osteoporosis Mother   . Cancer Father     lung  . COPD Father   . Cancer Sister     breast, non Hodgkin's Lymphoma  . Heart disease Sister     mvp  . Hypertension Brother   . Benign prostatic hyperplasia Brother   . Arthritis Brother   . Other Daughter     Beal's Syndrome  . Other Son     Beal's Syndrome  . Vision loss Maternal Grandmother   . Dementia Maternal Grandmother   . Vision loss Paternal Grandfather   . Hypertension Brother   . Benign prostatic hyperplasia Brother   . Cancer Brother     prostate  . Hypertension Brother   . COPD Brother   . Cancer Brother     prostate  . Hypertension Brother   . Hyperlipidemia Brother   . Cancer Brother     prostate  . Mental illness  Brother     schizophrenia  . Cirrhosis Brother     hep c  . Hypertension Sister   . Other Sister     thalessemia, anemia  . Hyperlipidemia Sister   . Gout Sister   . Arthritis Daughter   . Other Daughter     overweight    History   Social History  . Marital Status: Married    Spouse Name: N/A    Number of Children: N/A  . Years of Education: N/A   Occupational History  . Not on file.   Social History Main Topics  . Smoking status: Current Every Day Smoker -- 0.50 packs/day    Types: Cigarettes  . Smokeless tobacco: Never Used  . Alcohol Use: No  . Drug Use: No  . Sexually Active: Not on file   Other Topics Concern  . Not on file   Social History Narrative  . No narrative on file    Current Outpatient Prescriptions on File Prior to Visit  Medication Sig Dispense Refill  . ALPRAZolam (XANAX) 0.25 MG tablet Take 1 tablet (0.25 mg total) by mouth at bedtime as needed for sleep or anxiety.  30 tablet  3  . calcium carbonate (OS-CAL) 600 MG TABS  Take 1,800 mg by mouth daily.      . cetirizine (ZYRTEC) 10 MG tablet Take 10 mg by mouth daily.      . cholecalciferol (VITAMIN D) 1000 UNITS tablet Take 1,000 Units by mouth daily.      . cyclobenzaprine (FLEXERIL) 10 MG tablet TAKE 1 TABLET (10 MG TOTAL) BY MOUTH 2 (TWO) TIMES DAILY AS NEEDED.  30 tablet  2  . DULoxetine (CYMBALTA) 30 MG capsule Take 3 capsules (90 mg total) by mouth daily.  90 capsule  5  . esomeprazole (NEXIUM) 40 MG capsule Take 40 mg by mouth daily before breakfast.      . fenofibrate 160 MG tablet Take 1 tablet (160 mg total) by mouth daily. Taking 120mg  daily  30 tablet  3  . hydrochlorothiazide (HYDRODIURIL) 25 MG tablet Take 1 tablet (25 mg total) by mouth daily.  30 tablet  5  . HYDROcodone-acetaminophen (NORCO) 7.5-325 MG per tablet Take 1 tablet by mouth every 6 (six) hours as needed for [name].  60 tablet  1  . levothyroxine (SYNTHROID, LEVOTHROID) 25 MCG tablet Take 1 tablet (25 mcg total) by mouth daily.  30 tablet  5  . meloxicam (MOBIC) 15 MG tablet Take 15 mg by mouth daily.      . Multiple Vitamins-Minerals (MULTIVITAMIN WITH MINERALS) tablet Take 1 tablet by mouth daily.      . temazepam (RESTORIL) 30 MG capsule Take 1 capsule (30 mg total) by mouth at bedtime as needed.  30 capsule  1   No current facility-administered medications on file prior to visit.    Allergies  Allergen Reactions  . Chantix (Varenicline)     "Felt like having mental breakdown"  . Penicillins Swelling  . Ciprofloxacin Other (See Comments)    Muscle tightness, tendon aching and [name]  . Lyrica (Pregabalin) Swelling  . Neurontin (Gabapentin) Swelling  . Ceclor (Cefaclor) Rash  . Sulfa Antibiotics Rash    Review of Systems  Review of Systems  Constitutional: Negative for fever and malaise/fatigue.  HENT: Positive for ear [name], congestion and tinnitus. Negative for nosebleeds and ear discharge.   Eyes: Negative for discharge.  Respiratory: Negative for shortness of breath.    Cardiovascular: Positive for palpitations. Negative  for chest [name] and leg swelling.  Gastrointestinal: Negative for nausea, abdominal [name] and diarrhea.  Genitourinary: Negative for dysuria.  Musculoskeletal: Negative for falls.  Skin: Negative for rash.  Neurological: Positive for headaches. Negative for loss of consciousness.  Endo/Heme/Allergies: Negative for polydipsia.  Psychiatric/Behavioral: Negative for depression and suicidal ideas. The patient is not nervous/anxious and does not have insomnia.     Objective  BP 155/81  Pulse 77  Temp(Src) 99.9 F (37.7 C) (Temporal)  Ht 5\' 5"  (1.651 m)  Wt 226 lb 12.8 oz (102.876 kg)  BMI 37.74 kg/m2  SpO2 99%  Physical Exam  Physical Exam  Constitutional: She is oriented to person, place, and time and well-developed, well-nourished, and in no distress. No distress.  HENT:  Head: Normocephalic and atraumatic.  Eyes: Conjunctivae are normal.  Neck: Neck supple. No thyromegaly present.  Cardiovascular: Normal rate, regular rhythm and normal heart sounds.   No murmur heard. Pulmonary/Chest: Effort normal and breath sounds normal. She has no wheezes.  Abdominal: Soft. Bowel sounds are normal. She exhibits no distension and no mass.  Musculoskeletal: She exhibits no edema.  Lymphadenopathy:    She has no cervical adenopathy.  Neurological: She is alert and oriented to person, place, and time.  Skin: Skin is warm and dry. No rash noted. She is not diaphoretic.  Psychiatric: Memory, affect and judgment normal.    Lab Results  Component Value Date   TSH 1.31 01/21/2012   Lab Results  Component Value Date   WBC 4.7 01/21/2012   HGB 12.4 01/21/2012   HCT 37.2 01/21/2012   MCV 89.1 01/21/2012   PLT 228.0 01/21/2012   Lab Results  Component Value Date   CREATININE 1.0 01/21/2012   BUN 18 01/21/2012   NA 140 01/21/2012   K 3.6 01/21/2012   CL 102 01/21/2012   CO2 29 01/21/2012   Lab Results  Component Value Date   ALT 39* 01/21/2012    AST 26 01/21/2012   ALKPHOS 50 01/21/2012   BILITOT 0.4 01/21/2012   Lab Results  Component Value Date   CHOL 122 01/21/2012   Lab Results  Component Value Date   HDL 35.60* 01/21/2012   Lab Results  Component Value Date   LDLCALC 71 01/21/2012   Lab Results  Component Value Date   TRIG 77.0 01/21/2012   Lab Results  Component Value Date   CHOLHDL 3 01/21/2012     Assessment & Plan  HTN (hypertension) Increase metoprolol to 50 mg po bid and reassess at next visit.   SOM (serous otitis media) Patient with recent OM and now with [facility] referred to ENT for further consideration and evaluation of her nasal polyp

## 2012-09-12 ENCOUNTER — Ambulatory Visit (INDEPENDENT_AMBULATORY_CARE_PROVIDER_SITE_OTHER): Payer: 59 | Admitting: Family

## 2012-09-12 ENCOUNTER — Encounter: Payer: Self-pay | Admitting: Family

## 2012-09-12 VITALS — BP 128/80 | HR 64 | Temp 98.0°F | Resp 16 | Wt 228.1 lb

## 2012-09-12 DIAGNOSIS — J309 Allergic rhinitis, unspecified: Secondary | ICD-10-CM

## 2012-09-12 DIAGNOSIS — J302 Other seasonal allergic rhinitis: Secondary | ICD-10-CM | POA: Insufficient documentation

## 2012-09-12 MED ORDER — OLOPATADINE HCL 0.1 % OP SOLN: 1.0000 [drp] | Freq: Two times a day (BID) | OPHTHALMIC | Status: DC

## 2012-09-12 NOTE — Assessment & Plan Note (Addendum)
Recommended that she add zyrtec, continue flonase, add patanol prn for itching eyes. Call if symptoms worsen or if symptoms do not improve.

## 2012-09-12 NOTE — Patient Instructions (Addendum)
Please call if symptoms worsen or if not improved in 1-2 weeks.

## 2012-09-12 NOTE — Progress Notes (Signed)
Subjective:    Patient ID: Susan [name], female    DOB: January 19, 1951, 62 y.o.   MRN: [id number]  HPI  Pt reports nasal congestion and runny nose. Has itching in eye, ears and nose since Monday. Has been taking OTC meds without resolution of symptoms. She has tried benadryl allergy and flonase.  She denies fever.       Review of Systems    see HPI  Past Medical History  Diagnosis Date  . Sleep apnea     on CPAP  . Osteopenia   . Fatty liver disease, nonalcoholic   . Obesity   . History of blood clots 2004    during cancer treatment  . Anxiety   . Osteoarthritis   . DDD (degenerative disc disease)   . DJD (degenerative joint disease)   . Cancer 2004    ductal carcinoma; lumpectomy  . Depression   . GERD (gastroesophageal reflux disease)   . Fibromyalgia   . Peripheral neuropathy   . Muscle spasm 12/30/2011  . Anemia 01/28/2012  . HTN (hypertension) 01/28/2012  . Hyperlipidemia 01/28/2012  . Hyperglycemia 01/28/2012  . Preventative health care 01/28/2012  . Pain of left heel 01/28/2012  . Nasal polyp 08/30/2012    History   Social History  . Marital Status: Married    Spouse Name: N/A    Number of Children: N/A  . Years of Education: N/A   Occupational History  . Not on file.   Social History Main Topics  . Smoking status: Current Every Day Smoker -- 0.50 packs/day    Types: Cigarettes  . Smokeless tobacco: Never Used  . Alcohol Use: No  . Drug Use: No  . Sexually Active: Not on file   Other Topics Concern  . Not on file   Social History Narrative  . No narrative on file    Past Surgical History  Procedure Laterality Date  . Breast surgery  2004    lumpectomy  . Joint replacement      right total knee  . Bilateral oophorectomy  01/2003  . Nasal septum surgery    . Carpal tunnel release      right  . Abdominal hysterectomy  1991  . Thyroidectomy, partial  mid 80's    right side    Family History  Problem Relation Age of Onset  . Cancer  Mother     liver cancer, hep c  . Heart disease Mother     chf  . Hypertension Mother   . Hyperlipidemia Mother   . Osteoporosis Mother   . Cancer Father     lung  . COPD Father   . Cancer Sister     breast, non Hodgkin's Lymphoma  . Heart disease Sister     mvp  . Hypertension Brother   . Benign prostatic hyperplasia Brother   . Arthritis Brother   . Other Daughter     Beal's Syndrome  . Other Son     Beal's Syndrome  . Vision loss Maternal Grandmother   . Dementia Maternal Grandmother   . Vision loss Paternal Grandfather   . Hypertension Brother   . Benign prostatic hyperplasia Brother   . Cancer Brother     prostate  . Hypertension Brother   . COPD Brother   . Cancer Brother     prostate  . Hypertension Brother   . Hyperlipidemia Brother   . Cancer Brother     prostate  . Mental illness Brother  schizophrenia  . Cirrhosis Brother     hep c  . Hypertension Sister   . Other Sister     thalessemia, anemia  . Hyperlipidemia Sister   . Gout Sister   . Arthritis Daughter   . Other Daughter     overweight    Allergies  Allergen Reactions  . Chantix (Varenicline)     "Felt like having mental breakdown"  . Penicillins Swelling  . Ciprofloxacin Other (See Comments)    Muscle tightness, tendon aching and pain  . Lyrica (Pregabalin) Swelling  . Neurontin (Gabapentin) Swelling  . Ceclor (Cefaclor) Rash  . Sulfa Antibiotics Rash    Current Outpatient Prescriptions on File Prior to Visit  Medication Sig Dispense Refill  . ALPRAZolam (XANAX) 0.25 MG tablet Take 1 tablet (0.25 mg total) by mouth at bedtime as needed for sleep or anxiety.  30 tablet  3  . calcium carbonate (OS-CAL) 600 MG TABS Take 1,800 mg by mouth daily.      . cholecalciferol (VITAMIN D) 1000 UNITS tablet Take 1,000 Units by mouth daily.      . cyclobenzaprine (FLEXERIL) 10 MG tablet TAKE 1 TABLET (10 MG TOTAL) BY MOUTH 2 (TWO) TIMES DAILY AS NEEDED.  30 tablet  2  . DULoxetine (CYMBALTA)  30 MG capsule Take 3 capsules (90 mg total) by mouth daily.  90 capsule  5  . esomeprazole (NEXIUM) 40 MG capsule Take 40 mg by mouth daily before breakfast.      . fenofibrate 160 MG tablet Take 1 tablet (160 mg total) by mouth daily. Taking 120mg  daily  30 tablet  3  . hydrochlorothiazide (HYDRODIURIL) 25 MG tablet Take 1 tablet (25 mg total) by mouth daily.  30 tablet  5  . HYDROcodone-acetaminophen (NORCO) 7.5-325 MG per tablet Take 1 tablet by mouth every 6 (six) hours as needed for pain.  60 tablet  1  . levothyroxine (SYNTHROID, LEVOTHROID) 25 MCG tablet Take 1 tablet (25 mcg total) by mouth daily.  30 tablet  5  . meloxicam (MOBIC) 15 MG tablet Take 15 mg by mouth daily.      . metoprolol (LOPRESSOR) 50 MG tablet Take 1 tablet (50 mg total) by mouth 2 (two) times daily.  180 tablet  3  . Multiple Vitamins-Minerals (MULTIVITAMIN WITH MINERALS) tablet Take 1 tablet by mouth daily.      . temazepam (RESTORIL) 30 MG capsule Take 1 capsule (30 mg total) by mouth at bedtime as needed.  30 capsule  1   No current facility-administered medications on file prior to visit.    BP 128/80  Pulse 64  Temp(Src) 98 F (36.7 C) (Oral)  Resp 16  Wt 228 lb 1.9 oz (103.475 kg)  BMI 37.96 kg/m2  SpO2 99%    Objective:   Physical Exam  Constitutional: She is oriented to person, place, and time. She appears well-developed and well-nourished. No distress.  HENT:  Head: Normocephalic and atraumatic.  Cardiovascular: Normal rate and regular rhythm.   No murmur heard. Pulmonary/Chest: Effort normal and breath sounds normal. No respiratory distress. She has no wheezes. She has no rales. She exhibits no tenderness.  Lymphadenopathy:    She has no cervical adenopathy.  Neurological: She is alert and oriented to person, place, and time.  Skin: Skin is warm and dry.  Psychiatric: She has a normal mood and affect. Her behavior is normal. Judgment and thought content normal.          Assessment &  Plan:

## 2012-10-30 ENCOUNTER — Ambulatory Visit: Payer: 59 | Admitting: Family Medicine

## 2012-11-06 ENCOUNTER — Other Ambulatory Visit: Payer: Self-pay | Admitting: Family Medicine

## 2012-11-06 DIAGNOSIS — M549 Dorsalgia, unspecified: Secondary | ICD-10-CM

## 2012-11-06 MED ORDER — HYDROCODONE-ACETAMINOPHEN 7.5-325 MG PO TABS: 1.0000 | ORAL_TABLET | Freq: Four times a day (QID) | ORAL | Status: DC | PRN

## 2012-11-06 NOTE — Telephone Encounter (Signed)
Please advise refill? Last RX was done on 07-16-12 quantity 60 with 1 refill   If ok fax to [phone]

## 2012-11-06 NOTE — Telephone Encounter (Signed)
Refill- hydrocodone acetaminoph 7.5-325. Take one tablet by mouth every 6 hours as needed for pain. Last fill 4.19.14

## 2012-11-06 NOTE — Telephone Encounter (Signed)
Rx faxed to pharmacy/[facility] 

## 2012-11-14 ENCOUNTER — Telehealth: Payer: Self-pay | Admitting: *Deleted

## 2012-11-14 MED ORDER — ESOMEPRAZOLE MAGNESIUM 40 MG PO CPDR: 40.0000 mg | DELAYED_RELEASE_CAPSULE | Freq: Every day | ORAL | Status: DC

## 2012-11-14 NOTE — Telephone Encounter (Signed)
Rx request to pharmacy/[facility]  

## 2012-11-20 ENCOUNTER — Ambulatory Visit: Payer: 59 | Admitting: Family Medicine

## 2012-12-17 ENCOUNTER — Telehealth: Payer: Self-pay | Admitting: Family Medicine

## 2012-12-17 NOTE — Telephone Encounter (Signed)
Refill-cyclobenzaprine 10mg  tablet. Take one tablet by mouth two times daily as needed. Qty 30 date written 1.27.14  Refill-alprazolam 0.25mg  tablet. Take one tablet by mouth at bedtime as needed for sleep or anxiety.

## 2012-12-18 ENCOUNTER — Telehealth: Payer: Self-pay | Admitting: *Deleted

## 2012-12-18 DIAGNOSIS — F4323 Adjustment disorder with mixed anxiety and depressed mood: Secondary | ICD-10-CM

## 2012-12-18 NOTE — Telephone Encounter (Signed)
Faxed refill request received from pharmacy for Alprazolam Last filled by MD on 03.12.14, #[id number] Last AEX - 04.20.14 Next AEX - 2 Months, Appointment scheduled 09.03.14 Please Advise/SLS

## 2012-12-18 NOTE — Telephone Encounter (Signed)
OK to give Alprazolam same strength, same sig, #30 1 rf

## 2012-12-19 MED ORDER — CYCLOBENZAPRINE HCL 10 MG PO TABS: 10.0000 mg | ORAL_TABLET | Freq: Two times a day (BID) | ORAL | Status: DC | PRN

## 2012-12-19 MED ORDER — ALPRAZOLAM 0.25 MG PO TABS: 0.2500 mg | ORAL_TABLET | Freq: Every evening | ORAL | Status: DC | PRN

## 2012-12-19 NOTE — Telephone Encounter (Signed)
Alprazolam RX was faxed this morning.   Please advise Flexeril RX? Last RX was wrote 05-31-12 quantity 30 with 2 refills

## 2012-12-19 NOTE — Telephone Encounter (Signed)
RX faxed

## 2012-12-19 NOTE — Telephone Encounter (Signed)
OK to refill Flexeril with same strength, same sig, same number with 1rf

## 2012-12-26 ENCOUNTER — Other Ambulatory Visit: Payer: Self-pay | Admitting: Family Medicine

## 2012-12-31 ENCOUNTER — Other Ambulatory Visit: Payer: Self-pay | Admitting: Family Medicine

## 2013-01-07 ENCOUNTER — Ambulatory Visit: Payer: 59 | Admitting: Family Medicine

## 2013-01-09 ENCOUNTER — Encounter: Payer: Self-pay | Admitting: Family Medicine

## 2013-01-09 ENCOUNTER — Ambulatory Visit (INDEPENDENT_AMBULATORY_CARE_PROVIDER_SITE_OTHER): Payer: 59 | Admitting: Family Medicine

## 2013-01-09 VITALS — BP 120/86 | HR 84 | Temp 98.4°F | Ht 65.0 in | Wt 228.0 lb

## 2013-01-09 DIAGNOSIS — I10 Essential (primary) hypertension: Secondary | ICD-10-CM

## 2013-01-09 DIAGNOSIS — D649 Anemia, unspecified: Secondary | ICD-10-CM

## 2013-01-09 DIAGNOSIS — Z23 Encounter for immunization: Secondary | ICD-10-CM

## 2013-01-09 DIAGNOSIS — K760 Fatty (change of) liver, not elsewhere classified: Secondary | ICD-10-CM

## 2013-01-09 DIAGNOSIS — F329 Major depressive disorder, single episode, unspecified: Secondary | ICD-10-CM

## 2013-01-09 DIAGNOSIS — F32A Depression, unspecified: Secondary | ICD-10-CM

## 2013-01-09 DIAGNOSIS — F419 Anxiety disorder, unspecified: Secondary | ICD-10-CM

## 2013-01-09 DIAGNOSIS — K7689 Other specified diseases of liver: Secondary | ICD-10-CM

## 2013-01-09 DIAGNOSIS — F341 Dysthymic disorder: Secondary | ICD-10-CM

## 2013-01-09 DIAGNOSIS — F418 Other specified anxiety disorders: Secondary | ICD-10-CM

## 2013-01-09 DIAGNOSIS — K219 Gastro-esophageal reflux disease without esophagitis: Secondary | ICD-10-CM

## 2013-01-09 MED ORDER — DULOXETINE HCL 30 MG PO CPEP: 90.0000 mg | ORAL_CAPSULE | Freq: Every day | ORAL | Status: DC

## 2013-01-11 NOTE — Assessment & Plan Note (Signed)
Stable on Duloxetine and Alprazolam

## 2013-01-11 NOTE — Assessment & Plan Note (Signed)
Well controlled, no changes 

## 2013-01-11 NOTE — Progress Notes (Signed)
Patient ID: Susan [name], female   DOB: 07/03/1950, 62 y.o.   MRN: [id number] Susan [name] [id number] 07/07/1950 01/11/2013      Progress Note-Follow Up  Subjective  Chief Complaint  Chief Complaint  Patient presents with  . Follow-up    2 month    HPI  This [age]-year-old female who is in today for followup. She's having more trouble with epigastric discomfort and heartburn. Does report the past she had a spell or a but incompletely treated it. She reports Biaxin caused problems in her muscles. She was seen recently at urgent care and given that she had roughly 2 weeks ago. Was struggling with respiratory symptoms which have been improving. She was told she had serous otitis media but she's not having significant symptoms now. Symptoms were worse in the left than the right. Foods or chills. No headaches or ear pain. No hearing loss, chest pain, palpitations, shortness of, GI or GU concerns. He is taking medications as prescribed  Past Medical History  Diagnosis Date  . Sleep apnea     on CPAP  . Osteopenia   . Fatty liver disease, nonalcoholic   . Obesity   . History of blood clots 2004    during cancer treatment  . Anxiety   . Osteoarthritis   . DDD (degenerative disc disease)   . DJD (degenerative joint disease)   . Cancer 2004    ductal carcinoma; lumpectomy  . Depression   . GERD (gastroesophageal reflux disease)   . Fibromyalgia   . Peripheral neuropathy   . Muscle spasm 12/30/2011  . Anemia 01/28/2012  . HTN (hypertension) 01/28/2012  . Hyperlipidemia 01/28/2012  . Hyperglycemia 01/28/2012  . Preventative health care 01/28/2012  . Pain of left heel 01/28/2012  . Nasal polyp 08/30/2012    Past Surgical History  Procedure Laterality Date  . Breast surgery  2004    lumpectomy  . Joint replacement      right total knee  . Bilateral oophorectomy  01/2003  . Nasal septum surgery    . Carpal tunnel release      right  . Abdominal hysterectomy  1991  .  Thyroidectomy, partial  mid 80's    right side    Family History  Problem Relation Age of Onset  . Cancer Mother     liver cancer, hep c  . Heart disease Mother     chf  . Hypertension Mother   . Hyperlipidemia Mother   . Osteoporosis Mother   . Cancer Father     lung  . COPD Father   . Cancer Sister     breast, non Hodgkin's Lymphoma  . Heart disease Sister     mvp  . Hypertension Brother   . Benign prostatic hyperplasia Brother   . Arthritis Brother   . Other Daughter     Beal's Syndrome  . Other Son     Beal's Syndrome  . Vision loss Maternal Grandmother   . Dementia Maternal Grandmother   . Vision loss Paternal Grandfather   . Hypertension Brother   . Benign prostatic hyperplasia Brother   . Cancer Brother     prostate  . Hypertension Brother   . COPD Brother   . Cancer Brother     prostate  . Hypertension Brother   . Hyperlipidemia Brother   . Cancer Brother     prostate  . Mental illness Brother     schizophrenia  . Cirrhosis Brother  hep c  . Hypertension Sister   . Other Sister     thalessemia, anemia  . Hyperlipidemia Sister   . Gout Sister   . Arthritis Daughter   . Other Daughter     overweight    History   Social History  . Marital Status: Married    Spouse Name: N/A    Number of Children: N/A  . Years of Education: N/A   Occupational History  . Not on file.   Social History Main Topics  . Smoking status: Current Every Day Smoker -- 0.50 packs/day    Types: Cigarettes  . Smokeless tobacco: Never Used  . Alcohol Use: No  . Drug Use: No  . Sexual Activity: Not on file   Other Topics Concern  . Not on file   Social History Narrative  . No narrative on file    Current Outpatient Prescriptions on File Prior to Visit  Medication Sig Dispense Refill  . ALPRAZolam (XANAX) 0.25 MG tablet Take 1 tablet (0.25 mg total) by mouth at bedtime as needed for sleep or anxiety.  30 tablet  1  . calcium carbonate (OS-CAL) 600 MG TABS  Take 1,800 mg by mouth daily.      . cetirizine (ZYRTEC) 10 MG tablet Take 10 mg by mouth daily.      . cholecalciferol (VITAMIN D) 1000 UNITS tablet Take 1,000 Units by mouth daily.      . cyclobenzaprine (FLEXERIL) 10 MG tablet Take 1 tablet (10 mg total) by mouth 2 (two) times daily as needed for muscle spasms.  30 tablet  1  . esomeprazole (NEXIUM) 40 MG capsule Take 1 capsule (40 mg total) by mouth daily before breakfast.  30 capsule  5  . fenofibrate 160 MG tablet TAKE 1 TABLET BY MOUTH EVERY DAY  30 tablet  3  . fluticasone (FLONASE) 50 MCG/ACT nasal spray Place 1 spray into the nose 2 (two) times daily.      . hydrochlorothiazide (HYDRODIURIL) 25 MG tablet TAKE 1 TABLET (25 MG TOTAL) BY MOUTH DAILY.  30 tablet  5  . HYDROcodone-acetaminophen (NORCO) 7.5-325 MG per tablet Take 1 tablet by mouth every 6 (six) hours as needed for pain.  60 tablet  1  . levothyroxine (SYNTHROID, LEVOTHROID) 25 MCG tablet Take 1 tablet (25 mcg total) by mouth daily.  30 tablet  5  . metoprolol (LOPRESSOR) 50 MG tablet Take 1 tablet (50 mg total) by mouth 2 (two) times daily.  180 tablet  3  . Multiple Vitamins-Minerals (MULTIVITAMIN WITH MINERALS) tablet Take 1 tablet by mouth daily.      Marland Kitchen olopatadine (PATANOL) 0.1 % ophthalmic solution Place 1 drop into both eyes 2 (two) times daily.  5 mL  2  . temazepam (RESTORIL) 30 MG capsule Take 1 capsule (30 mg total) by mouth at bedtime as needed.  30 capsule  1   No current facility-administered medications on file prior to visit.    Allergies  Allergen Reactions  . Chantix [Varenicline]     "Felt like having mental breakdown"  . Penicillins Swelling  . Ciprofloxacin Other (See Comments)    Muscle tightness, tendon aching and pain  . Lyrica [Pregabalin] Swelling  . Neurontin [Gabapentin] Swelling  . Ceclor [Cefaclor] Rash  . Sulfa Antibiotics Rash    Review of Systems  Review of Systems  Constitutional: Negative for fever and malaise/fatigue.  HENT:  Negative for congestion.   Eyes: Negative for discharge.  Respiratory: Negative for shortness  of breath.   Cardiovascular: Negative for chest pain, palpitations and leg swelling.  Gastrointestinal: Positive for heartburn and abdominal pain. Negative for nausea and diarrhea.  Genitourinary: Negative for dysuria.  Musculoskeletal: Negative for falls.  Skin: Negative for rash.  Neurological: Negative for loss of consciousness and headaches.  Endo/Heme/Allergies: Negative for polydipsia.  Psychiatric/Behavioral: Negative for depression and suicidal ideas. The patient is not nervous/anxious and does not have insomnia.     Objective  BP 120/86  Pulse 84  Temp(Src) 98.4 F (36.9 C) (Oral)  Ht 5\' 5"  (1.651 m)  Wt 228 lb 0.6 oz (103.438 kg)  BMI 37.95 kg/m2  SpO2 98%  Physical Exam  Physical Exam  Constitutional: She is oriented to person, place, and time and well-developed, well-nourished, and in no distress. No distress.  HENT:  Head: Normocephalic and atraumatic.  Eyes: Conjunctivae are normal.  Neck: Neck supple. No thyromegaly present.  Cardiovascular: Normal rate, regular rhythm and normal heart sounds.   No murmur heard. Pulmonary/Chest: Effort normal and breath sounds normal. She has no wheezes.  Abdominal: She exhibits no distension and no mass.  Musculoskeletal: She exhibits no edema.  Lymphadenopathy:    She has no cervical adenopathy.  Neurological: She is alert and oriented to person, place, and time.  Skin: Skin is warm and dry. No rash noted. She is not diaphoretic.  Psychiatric: Memory, affect and judgment normal.    Lab Results  Component Value Date   TSH 1.31 01/21/2012   Lab Results  Component Value Date   WBC 4.7 01/21/2012   HGB 12.4 01/21/2012   HCT 37.2 01/21/2012   MCV 89.1 01/21/2012   PLT 228.0 01/21/2012   Lab Results  Component Value Date   CREATININE 1.0 01/21/2012   BUN 18 01/21/2012   NA 140 01/21/2012   K 3.6 01/21/2012   CL 102 01/21/2012    CO2 29 01/21/2012   Lab Results  Component Value Date   ALT 39* 01/21/2012   AST 26 01/21/2012   ALKPHOS 50 01/21/2012   BILITOT 0.4 01/21/2012   Lab Results  Component Value Date   CHOL 122 01/21/2012   Lab Results  Component Value Date   HDL 35.60* 01/21/2012   Lab Results  Component Value Date   LDLCALC 71 01/21/2012   Lab Results  Component Value Date   TRIG 77.0 01/21/2012   Lab Results  Component Value Date   CHOLHDL 3 01/21/2012     Assessment & Plan  HTN (hypertension) Well controlled, no changes.   GERD (gastroesophageal reflux disease) Agrees to return for Clo breath test after Nexium has been stopped for 2 weeks, avoid offending foods.  Fatty liver disease, nonalcoholic Avoid simple carbs and trans fats.   Anemia Agrees to return for fasting labs in next month.  Anxiety and depression Stable on Duloxetine and Alprazolam

## 2013-01-11 NOTE — Assessment & Plan Note (Signed)
Agrees to return for fasting labs in next month.

## 2013-01-11 NOTE — Assessment & Plan Note (Signed)
Avoid simple carbs and trans fats.

## 2013-01-11 NOTE — Assessment & Plan Note (Signed)
Agrees to return for Clo breath test after Nexium has been stopped for 2 weeks, avoid offending foods.

## 2013-01-19 ENCOUNTER — Telehealth: Payer: Self-pay | Admitting: *Deleted

## 2013-01-19 MED ORDER — TEMAZEPAM 30 MG PO CAPS: 30.0000 mg | ORAL_CAPSULE | Freq: Every evening | ORAL | Status: DC | PRN

## 2013-01-19 NOTE — Telephone Encounter (Signed)
OK to refill Temazepam same strength, same sig, same number with 1 rf

## 2013-01-19 NOTE — Telephone Encounter (Signed)
RX faxed to pharmacy.

## 2013-01-19 NOTE — Telephone Encounter (Signed)
Faxed refill request received from pharmacy for Temazepam Last filled by MD on 12.23.2013, #[id number] Last AEX - 09.05.14 Please Advise/SLS

## 2013-02-13 ENCOUNTER — Other Ambulatory Visit: Payer: Self-pay

## 2013-02-13 DIAGNOSIS — M549 Dorsalgia, unspecified: Secondary | ICD-10-CM

## 2013-02-13 MED ORDER — HYDROCODONE-ACETAMINOPHEN 7.5-325 MG PO TABS: 1.0000 | ORAL_TABLET | Freq: Four times a day (QID) | ORAL | Status: DC | PRN

## 2013-02-13 NOTE — Telephone Encounter (Signed)
Patient called requesting her Hydrocodone to be refilled. Pts last RX was on 11-06-12 quantity 60 with 1 refill.  Patient was informed that if MD approves this refill that this RX has to be picked up now at the office.  Please advise refill?

## 2013-02-13 NOTE — Telephone Encounter (Signed)
Left a detailed message on vm. RX is at front desk

## 2013-02-24 ENCOUNTER — Other Ambulatory Visit: Payer: Self-pay | Admitting: Family Medicine

## 2013-03-02 ENCOUNTER — Telehealth: Payer: Self-pay | Admitting: Family Medicine

## 2013-03-02 ENCOUNTER — Telehealth: Payer: Self-pay

## 2013-03-02 DIAGNOSIS — I10 Essential (primary) hypertension: Secondary | ICD-10-CM

## 2013-03-02 DIAGNOSIS — Z0000 Encounter for general adult medical examination without abnormal findings: Secondary | ICD-10-CM

## 2013-03-02 DIAGNOSIS — K760 Fatty (change of) liver, not elsewhere classified: Secondary | ICD-10-CM

## 2013-03-02 DIAGNOSIS — R739 Hyperglycemia, unspecified: Secondary | ICD-10-CM

## 2013-03-02 DIAGNOSIS — E785 Hyperlipidemia, unspecified: Secondary | ICD-10-CM

## 2013-03-02 NOTE — Telephone Encounter (Signed)
Lipid, renal, cbc, tsh, hepatic, hgba1c for preventative, hi chol, hyperglycemia, elevated lft

## 2013-03-02 NOTE — Telephone Encounter (Signed)
She had her blood drawn this morning  She wants to be sure she gets an A1C

## 2013-03-02 NOTE — Telephone Encounter (Signed)
Please advise which labs need to be drawn? Pt came in this morning to have labs drawn.

## 2013-03-02 NOTE — Telephone Encounter (Signed)
Please advise 

## 2013-03-03 LAB — LIPID PANEL
Cholesterol: 123 mg/dL (ref 0–200)
HDL: 38 mg/dL — ABNORMAL LOW (ref >39)
LDL Cholesterol: 59 mg/dL (ref 0–99)
Total CHOL/HDL Ratio: 3.2 Ratio
Triglycerides: 131 mg/dL (ref <150)
VLDL: 26 mg/dL (ref 0–40)

## 2013-03-03 LAB — RENAL FUNCTION PANEL
Albumin: 4.3 g/dL (ref 3.5–5.2)
BUN: 18 mg/dL (ref 6–23)
Calcium: 9.7 mg/dL (ref 8.4–10.5)
Chloride: 102 mEq/L (ref 96–112)
Creat: 0.87 mg/dL (ref 0.50–1.10)
Glucose, Bld: 110 mg/dL — ABNORMAL HIGH (ref 70–99)
Sodium: 141 mEq/L (ref 135–145)

## 2013-03-03 LAB — HEMOGLOBIN A1C
Hgb A1c MFr Bld: 6.2 % — ABNORMAL HIGH (ref <5.7)
Mean Plasma Glucose: 131 mg/dL — ABNORMAL HIGH (ref <117)

## 2013-03-03 LAB — CBC
MCH: 29.4 pg (ref 26.0–34.0)
Platelets: 274 10*3/uL (ref 150–400)
RBC: 4.59 MIL/uL (ref 3.87–5.11)
RDW: 12.8 % (ref 11.5–15.5)
WBC: 5.7 10*3/uL (ref 4.0–10.5)

## 2013-03-03 LAB — HEPATIC FUNCTION PANEL
Bilirubin, Direct: 0.1 mg/dL (ref 0.0–0.3)
Indirect Bilirubin: 0.2 mg/dL (ref 0.0–0.9)
Total Protein: 6.6 g/dL (ref 6.0–8.3)

## 2013-03-03 LAB — TSH: TSH: 1.245 u[IU]/mL (ref 0.350–4.500)

## 2013-03-09 ENCOUNTER — Ambulatory Visit (INDEPENDENT_AMBULATORY_CARE_PROVIDER_SITE_OTHER): Payer: 59 | Admitting: Family Medicine

## 2013-03-09 ENCOUNTER — Encounter: Payer: Self-pay | Admitting: Family Medicine

## 2013-03-09 ENCOUNTER — Other Ambulatory Visit: Payer: Self-pay

## 2013-03-09 VITALS — BP 112/72 | HR 70 | Temp 98.0°F | Ht 65.0 in | Wt 230.1 lb

## 2013-03-09 DIAGNOSIS — Z1231 Encounter for screening mammogram for malignant neoplasm of breast: Secondary | ICD-10-CM

## 2013-03-09 DIAGNOSIS — E785 Hyperlipidemia, unspecified: Secondary | ICD-10-CM

## 2013-03-09 DIAGNOSIS — Z0000 Encounter for general adult medical examination without abnormal findings: Secondary | ICD-10-CM

## 2013-03-09 DIAGNOSIS — K219 Gastro-esophageal reflux disease without esophagitis: Secondary | ICD-10-CM

## 2013-03-09 DIAGNOSIS — Z23 Encounter for immunization: Secondary | ICD-10-CM

## 2013-03-09 DIAGNOSIS — Z124 Encounter for screening for malignant neoplasm of cervix: Secondary | ICD-10-CM

## 2013-03-09 DIAGNOSIS — I10 Essential (primary) hypertension: Secondary | ICD-10-CM

## 2013-03-09 DIAGNOSIS — D649 Anemia, unspecified: Secondary | ICD-10-CM

## 2013-03-09 NOTE — Progress Notes (Signed)
Patient ID: Susan [name], female   DOB: [birth year], 62 y.o.   MRN: [id number]  Susan [name] [id number] 07-06-1950 03/09/2013      Progress Note-Follow Up  Subjective  Chief Complaint  Chief Complaint  Patient presents with  . Annual Exam    physical    HPI  Patient is a a 62 yo female in today for annual exam. Had a fall 3 weeks ago and hit her head and left knee. Initially had some swelling and pain but is improving. No recent illness, no HA, cp, palp, sob, gu c/o. Reflux present but improved. Responding to dietary changes and nexium. Taking meds as prescribed. No GYN c/o  Past Medical History  Diagnosis Date  . Sleep apnea     on CPAP  . Osteopenia   . Fatty liver disease, nonalcoholic   . Obesity   . History of blood clots 2004    during cancer treatment  . Anxiety   . Osteoarthritis   . DDD (degenerative disc disease)   . DJD (degenerative joint disease)   . Cancer 2004    ductal carcinoma; lumpectomy  . Depression   . GERD (gastroesophageal reflux disease)   . Fibromyalgia   . Peripheral neuropathy   . Muscle spasm 12/30/2011  . Anemia 01/28/2012  . HTN (hypertension) 01/28/2012  . Hyperlipidemia 01/28/2012  . Hyperglycemia 01/28/2012  . Preventative health care 01/28/2012  . Pain of left heel 01/28/2012  . Nasal polyp 08/30/2012    Past Surgical History  Procedure Laterality Date  . Breast surgery  2004    lumpectomy  . Joint replacement      right total knee  . Bilateral oophorectomy  01/2003  . Nasal septum surgery    . Carpal tunnel release      right  . Abdominal hysterectomy  1991  . Thyroidectomy, partial  mid 80's    right side    Family History  Problem Relation Age of Onset  . Cancer Mother     liver cancer, hep c  . Heart disease Mother     chf  . Hypertension Mother   . Hyperlipidemia Mother   . Osteoporosis Mother   . Cancer Father     lung  . COPD Father   . Cancer Sister     breas at 45t, non Hodgkin's Lymphoma  . Heart  disease Sister     mvp  . Hypertension Brother   . Benign prostatic hyperplasia Brother   . Arthritis Brother   . Kidney disease Brother   . Other Daughter     Beal's Syndrome  . Other Son     Beal's Syndrome  . Vision loss Maternal Grandmother   . Dementia Maternal Grandmother   . Vision loss Paternal Grandfather   . Hypertension Brother   . Benign prostatic hyperplasia Brother   . Cancer Brother     prostate  . Hypertension Brother   . COPD Brother   . Cancer Brother     prostate  . Hypertension Brother   . Hyperlipidemia Brother   . Cancer Brother     prostate  . Mental illness Brother     schizophrenia  . Cirrhosis Brother     hep c  . Hypertension Sister   . Other Sister     thalessemia, anemia  . Hyperlipidemia Sister   . Gout Sister   . Arthritis Daughter   . Other Daughter     overweight    History  Social History  . Marital Status: Married    Spouse Name: N/A    Number of Children: N/A  . Years of Education: N/A   Occupational History  . Not on file.   Social History Main Topics  . Smoking status: Current Every Day Smoker -- 0.50 packs/day    Types: Cigarettes  . Smokeless tobacco: Never Used  . Alcohol Use: No  . Drug Use: No  . Sexual Activity: No   Other Topics Concern  . Not on file   Social History Narrative  . No narrative on file    Current Outpatient Prescriptions on File Prior to Visit  Medication Sig Dispense Refill  . ALPRAZolam (XANAX) 0.25 MG tablet Take 1 tablet (0.25 mg total) by mouth at bedtime as needed for sleep or anxiety.  30 tablet  1  . calcium carbonate (OS-CAL) 600 MG TABS Take 1,800 mg by mouth daily.      . cetirizine (ZYRTEC) 10 MG tablet Take 10 mg by mouth daily.      . cholecalciferol (VITAMIN D) 1000 UNITS tablet Take 1,000 Units by mouth daily.      . cyclobenzaprine (FLEXERIL) 10 MG tablet Take 1 tablet (10 mg total) by mouth 2 (two) times daily as needed for muscle spasms.  30 tablet  1  . DULoxetine  (CYMBALTA) 30 MG capsule Take 3 capsules (90 mg total) by mouth daily.  90 capsule  5  . esomeprazole (NEXIUM) 40 MG capsule Take 1 capsule (40 mg total) by mouth daily before breakfast.  30 capsule  5  . fenofibrate 160 MG tablet TAKE 1 TABLET BY MOUTH EVERY DAY  30 tablet  3  . fluticasone (FLONASE) 50 MCG/ACT nasal spray Place 1 spray into the nose 2 (two) times daily.      . hydrochlorothiazide (HYDRODIURIL) 25 MG tablet TAKE 1 TABLET (25 MG TOTAL) BY MOUTH DAILY.  30 tablet  5  . HYDROcodone-acetaminophen (NORCO) 7.5-325 MG per tablet Take 1 tablet by mouth every 6 (six) hours as needed for pain.  60 tablet  0  . levothyroxine (SYNTHROID, LEVOTHROID) 25 MCG tablet TAKE 1 TABLET BY MOUTH EVERY DAY  30 tablet  5  . metoprolol (LOPRESSOR) 50 MG tablet Take 1 tablet (50 mg total) by mouth 2 (two) times daily.  180 tablet  3  . Multiple Vitamins-Minerals (MULTIVITAMIN WITH MINERALS) tablet Take 1 tablet by mouth daily.      Marland Kitchen olopatadine (PATANOL) 0.1 % ophthalmic solution Place 1 drop into both eyes 2 (two) times daily.  5 mL  2  . temazepam (RESTORIL) 30 MG capsule Take 1 capsule (30 mg total) by mouth at bedtime as needed.  30 capsule  1   No current facility-administered medications on file prior to visit.    Allergies  Allergen Reactions  . Chantix [Varenicline]     "Felt like having mental breakdown"  . Penicillins Swelling  . Ciprofloxacin Other (See Comments)    Muscle tightness, tendon aching and pain  . Lyrica [Pregabalin] Swelling  . Neurontin [Gabapentin] Swelling  . Tamoxifen     Blood clots  . Ceclor [Cefaclor] Rash  . Sulfa Antibiotics Rash    Review of Systems  Review of Systems  Constitutional: Negative for fever, chills and malaise/fatigue.  HENT: Negative for congestion, hearing loss and nosebleeds.   Eyes: Negative for discharge.  Respiratory: Negative for cough, sputum production, shortness of breath and wheezing.   Cardiovascular: Negative for chest pain,  palpitations and  leg swelling.  Gastrointestinal: Negative for heartburn, nausea, vomiting, abdominal pain, diarrhea, constipation and blood in stool.  Genitourinary: Negative for dysuria, urgency, frequency and hematuria.  Musculoskeletal: Positive for joint pain. Negative for back pain, falls and myalgias.       Left ankle pain  Skin: Negative for rash.  Neurological: Negative for dizziness, tremors, sensory change, focal weakness, loss of consciousness, weakness and headaches.  Endo/Heme/Allergies: Negative for polydipsia. Does not bruise/bleed easily.  Psychiatric/Behavioral: Negative for depression and suicidal ideas. The patient is not nervous/anxious and does not have insomnia.     Objective  BP 112/72  Pulse 70  Temp(Src) 98 F (36.7 C) (Oral)  Ht 5\' 5"  (1.651 m)  Wt 230 lb 1.9 oz (104.382 kg)  BMI 38.29 kg/m2  SpO2 97%  Physical Exam  Physical Exam  Constitutional: She is oriented to person, place, and time and well-developed, well-nourished, and in no distress. No distress.  HENT:  Head: Normocephalic and atraumatic.  Right Ear: External ear normal.  Left Ear: External ear normal.  Nose: Nose normal.  Mouth/Throat: Oropharynx is clear and moist. No oropharyngeal exudate.  Eyes: Conjunctivae are normal. Pupils are equal, round, and reactive to light. Right eye exhibits no discharge. Left eye exhibits no discharge. No scleral icterus.  Neck: Normal range of motion. Neck supple. No thyromegaly present.  Cardiovascular: Normal rate, regular rhythm, normal heart sounds and intact distal pulses.   No murmur heard. Pulmonary/Chest: Effort normal and breath sounds normal. No respiratory distress. She has no wheezes. She has no rales.  Abdominal: Soft. Bowel sounds are normal. She exhibits no distension and no mass. There is no tenderness.  Genitourinary: Vagina normal. No vaginal discharge found.  Left partial mastectomy  Musculoskeletal: Normal range of motion. She exhibits  no edema and no tenderness.  Lymphadenopathy:    She has no cervical adenopathy.  Neurological: She is alert and oriented to person, place, and time. She has normal reflexes. No cranial nerve deficit. Coordination normal.  Skin: Skin is warm and dry. No rash noted. She is not diaphoretic.  Psychiatric: Mood, memory and affect normal.    Lab Results  Component Value Date   TSH 1.245 03/02/2013   Lab Results  Component Value Date   WBC 5.7 03/02/2013   HGB 13.5 03/02/2013   HCT 39.2 03/02/2013   MCV 85.4 03/02/2013   PLT 274 03/02/2013   Lab Results  Component Value Date   CREATININE 0.87 03/02/2013   BUN 18 03/02/2013   NA 141 03/02/2013   K 4.0 03/02/2013   CL 102 03/02/2013   CO2 27 03/02/2013   Lab Results  Component Value Date   ALT 29 03/02/2013   AST 19 03/02/2013   ALKPHOS 52 03/02/2013   BILITOT 0.3 03/02/2013   Lab Results  Component Value Date   CHOL 123 03/02/2013   Lab Results  Component Value Date   HDL 38* 03/02/2013   Lab Results  Component Value Date   LDLCALC 59 03/02/2013   Lab Results  Component Value Date   TRIG 131 03/02/2013   Lab Results  Component Value Date   CHOLHDL 3.2 03/02/2013     Assessment & Plan  HTN (hypertension) Well controlled on current meds no changes  Cervical cancer screening Pap today  GERD (gastroesophageal reflux disease) Is well controlled on nexium, add a probiotic, avoid offending foods.  Anemia Well controlled, no changes.  Hyperlipidemia Good response to Fenofibrate and krill oil, avoid trans fats, increase exercise.  Preventative health care Given Pneumovax today, already had flu shot, reviewed annual labs, encouraged heart healthy diet.

## 2013-03-09 NOTE — Patient Instructions (Signed)
Gastroesophageal Reflux Disease, Adult  Gastroesophageal reflux disease (GERD) happens when acid from your stomach flows up into the esophagus. When acid comes in contact with the esophagus, the acid causes soreness (inflammation) in the esophagus. Over time, GERD may create small holes (ulcers) in the lining of the esophagus.  CAUSES   · Increased body weight. This puts pressure on the stomach, making acid rise from the stomach into the esophagus.  · Smoking. This increases acid production in the stomach.  · Drinking alcohol. This causes decreased pressure in the lower esophageal sphincter (valve or ring of muscle between the esophagus and stomach), allowing acid from the stomach into the esophagus.  · Late evening meals and a full stomach. This increases pressure and acid production in the stomach.  · A malformed lower esophageal sphincter.  Sometimes, no cause is found.  SYMPTOMS   · Burning pain in the lower part of the mid-chest behind the breastbone and in the mid-stomach area. This may occur twice a week or more often.  · Trouble swallowing.  · Sore throat.  · Dry cough.  · Asthma-like symptoms including chest tightness, shortness of breath, or wheezing.  DIAGNOSIS   Your caregiver may be able to diagnose GERD based on your symptoms. In some cases, X-rays and other tests may be done to check for complications or to check the condition of your stomach and esophagus.  TREATMENT   Your caregiver may recommend over-the-counter or prescription medicines to help decrease acid production. Ask your caregiver before starting or adding any new medicines.   HOME CARE INSTRUCTIONS   · Change the factors that you can control. Ask your caregiver for guidance concerning weight loss, quitting smoking, and alcohol consumption.  · Avoid foods and drinks that make your symptoms worse, such as:  · Caffeine or alcoholic drinks.  · Chocolate.  · Peppermint or mint flavorings.  · Garlic and onions.  · Spicy foods.  · Citrus fruits,  such as oranges, lemons, or limes.  · Tomato-based foods such as sauce, chili, salsa, and pizza.  · Fried and fatty foods.  · Avoid lying down for the 3 hours prior to your bedtime or prior to taking a nap.  · Eat small, frequent meals instead of large meals.  · Wear loose-fitting clothing. Do not wear anything tight around your waist that causes pressure on your stomach.  · Raise the head of your bed 6 to 8 inches with wood blocks to help you sleep. Extra pillows will not help.  · Only take over-the-counter or prescription medicines for pain, discomfort, or fever as directed by your caregiver.  · Do not take aspirin, ibuprofen, or other nonsteroidal anti-inflammatory drugs (NSAIDs).  SEEK IMMEDIATE MEDICAL CARE IF:   · You have pain in your arms, neck, jaw, teeth, or back.  · Your pain increases or changes in intensity or duration.  · You develop nausea, vomiting, or sweating (diaphoresis).  · You develop shortness of breath, or you faint.  · Your vomit is green, yellow, black, or looks like coffee grounds or blood.  · Your stool is red, bloody, or black.  These symptoms could be signs of other problems, such as heart disease, gastric bleeding, or esophageal bleeding.  MAKE SURE YOU:   · Understand these instructions.  · Will watch your condition.  · Will get help right away if you are not doing well or get worse.  Document Released: 01/31/2005 Document Revised: 07/16/2011 Document Reviewed: 11/10/2010  [employer]® Patient   Information ©2014 [employer].

## 2013-03-09 NOTE — Assessment & Plan Note (Signed)
Well controlled on current meds no changes 

## 2013-03-09 NOTE — Assessment & Plan Note (Signed)
Pap today 

## 2013-03-15 NOTE — Assessment & Plan Note (Signed)
Is well controlled on nexium, add a probiotic, avoid offending foods.

## 2013-03-15 NOTE — Assessment & Plan Note (Signed)
Good response to Fenofibrate and krill oil, avoid trans fats, increase exercise.

## 2013-03-15 NOTE — Assessment & Plan Note (Signed)
Well controlled, no changes 

## 2013-03-15 NOTE — Assessment & Plan Note (Signed)
Given Pneumovax today, already had flu shot, reviewed annual labs, encouraged heart healthy diet.

## 2013-03-20 ENCOUNTER — Telehealth: Payer: Self-pay | Admitting: *Deleted

## 2013-03-20 DIAGNOSIS — M549 Dorsalgia, unspecified: Secondary | ICD-10-CM

## 2013-03-20 MED ORDER — HYDROCODONE-ACETAMINOPHEN 7.5-325 MG PO TABS: 1.0000 | ORAL_TABLET | Freq: Four times a day (QID) | ORAL | Status: DC | PRN

## 2013-03-20 NOTE — Telephone Encounter (Signed)
RX printed and will put at front desk after MD signs.  Will call pt on Monday to inform her that RX is ready to be picked up

## 2013-03-20 NOTE — Telephone Encounter (Signed)
Refill request for Hydrocodone Last filled by MD on - 10.10.14, #[id number] Last AEX - 11.03.14 Please Advise/SLS

## 2013-03-20 NOTE — Telephone Encounter (Signed)
OK to refill Hydrocodone same strength, same sig, #60 no refills

## 2013-03-22 ENCOUNTER — Other Ambulatory Visit: Payer: Self-pay | Admitting: Family Medicine

## 2013-03-23 NOTE — Telephone Encounter (Signed)
Pt informed and RX at front desk 

## 2013-04-13 ENCOUNTER — Ambulatory Visit
Admission: RE | Admit: 2013-04-13 | Discharge: 2013-04-13 | Disposition: A | Discharge status: Home / Self Care | Payer: 59 | Source: Ambulatory Visit

## 2013-04-13 DIAGNOSIS — Z1231 Encounter for screening mammogram for malignant neoplasm of breast: Secondary | ICD-10-CM

## 2013-04-14 ENCOUNTER — Other Ambulatory Visit: Payer: Self-pay | Admitting: Family Medicine

## 2013-04-14 DIAGNOSIS — R928 Other abnormal and inconclusive findings on diagnostic imaging of breast: Secondary | ICD-10-CM

## 2013-04-15 NOTE — Progress Notes (Signed)
Another order was added on 04-14-13

## 2013-04-17 ENCOUNTER — Ambulatory Visit
Admission: RE | Admit: 2013-04-17 | Discharge: 2013-04-17 | Disposition: A | Discharge status: Home / Self Care | Payer: 59 | Source: Ambulatory Visit | Attending: Family Medicine | Admitting: Family Medicine

## 2013-04-17 ENCOUNTER — Other Ambulatory Visit: Payer: Self-pay | Admitting: Family Medicine

## 2013-04-17 DIAGNOSIS — R928 Other abnormal and inconclusive findings on diagnostic imaging of breast: Secondary | ICD-10-CM

## 2013-04-23 ENCOUNTER — Telehealth: Payer: Self-pay | Admitting: Family Medicine

## 2013-04-23 DIAGNOSIS — M549 Dorsalgia, unspecified: Secondary | ICD-10-CM

## 2013-04-23 NOTE — Telephone Encounter (Signed)
Can have rx for Norco, same strength, sig and #60

## 2013-04-23 NOTE — Telephone Encounter (Signed)
Please advise 

## 2013-04-23 NOTE — Telephone Encounter (Signed)
She wants a written rx for her pain medication

## 2013-04-24 MED ORDER — HYDROCODONE-ACETAMINOPHEN 7.5-325 MG PO TABS: 1.0000 | ORAL_TABLET | Freq: Four times a day (QID) | ORAL | Status: DC | PRN

## 2013-04-24 NOTE — Telephone Encounter (Signed)
Detailed message left on vm stating RX was ready to be picked up and to bring a photo id with her to sign out the RX

## 2013-05-03 ENCOUNTER — Other Ambulatory Visit: Payer: Self-pay | Admitting: Family Medicine

## 2013-05-12 ENCOUNTER — Telehealth: Payer: Self-pay | Admitting: Family Medicine

## 2013-05-12 DIAGNOSIS — I10 Essential (primary) hypertension: Secondary | ICD-10-CM

## 2013-05-12 MED ORDER — METOPROLOL TARTRATE 50 MG PO TABS: 50.0000 mg | ORAL_TABLET | Freq: Two times a day (BID) | ORAL | Status: DC

## 2013-05-12 NOTE — Telephone Encounter (Signed)
refill-metoprolol tarta 50mg  tab. Take one tablet by mouth two times daily...max of 30 days. Qty 180 last fill 12.8.14

## 2013-05-31 DIAGNOSIS — G4733 Obstructive sleep apnea (adult) (pediatric): Secondary | ICD-10-CM | POA: Insufficient documentation

## 2013-06-03 ENCOUNTER — Telehealth: Payer: Self-pay | Admitting: Family Medicine

## 2013-06-03 MED ORDER — OSELTAMIVIR PHOSPHATE 75 MG PO CAPS: 75.0000 mg | ORAL_CAPSULE | Freq: Every day | ORAL | Status: DC

## 2013-06-03 NOTE — Telephone Encounter (Signed)
Please rx Tamiflu 75 mg tab 1 tab po daily x 10 days for exposure prophylaxis

## 2013-06-03 NOTE — Telephone Encounter (Signed)
Rx request to pharmacy; pt informed/SLS  

## 2013-06-03 NOTE — Telephone Encounter (Signed)
Son was dx with the flu, request rx of tamiflu.

## 2013-06-11 ENCOUNTER — Other Ambulatory Visit: Payer: Self-pay

## 2013-06-11 DIAGNOSIS — M549 Dorsalgia, unspecified: Secondary | ICD-10-CM

## 2013-06-11 MED ORDER — HYDROCODONE-ACETAMINOPHEN 7.5-325 MG PO TABS: 1.0000 | ORAL_TABLET | Freq: Four times a day (QID) | ORAL | Status: DC | PRN

## 2013-06-11 NOTE — Telephone Encounter (Signed)
RX printed and pt informed 

## 2013-06-11 NOTE — Telephone Encounter (Signed)
Patient left a message stating that she would like a refill on her Hydrocodone.  Please advise refill?  Last RX was done on 04-24-13, quantity 60 with 0 refills

## 2013-06-11 NOTE — Telephone Encounter (Signed)
Refill granted.  Quantity 60.  No additional refills.  Ready for pickup.

## 2013-06-12 ENCOUNTER — Telehealth: Payer: Self-pay | Admitting: Family Medicine

## 2013-06-12 NOTE — Telephone Encounter (Signed)
Refill metoprolol

## 2013-06-12 NOTE — Telephone Encounter (Signed)
metoprolol (LOPRESSOR) 50 MG tablet 60 tablet 3 05/12/2013 Sig - Route: Take 1 tablet (50 mg total) by mouth 2 (two) times daily. - Oral E-Prescribing Status: Receipt confirmed by pharmacy (05/12/2013 5:06 PM EST)  Rx request Denied--Too Soon for request/SLS

## 2013-06-18 ENCOUNTER — Telehealth: Payer: Self-pay | Admitting: Family Medicine

## 2013-06-18 MED ORDER — HYDROCHLOROTHIAZIDE 25 MG PO TABS: 25.0000 mg | ORAL_TABLET | Freq: Every day | ORAL | Status: DC

## 2013-06-18 NOTE — Telephone Encounter (Signed)
Refill- hydrochlorothiazide °

## 2013-06-29 ENCOUNTER — Other Ambulatory Visit: Payer: Self-pay | Admitting: Family Medicine

## 2013-06-29 NOTE — Telephone Encounter (Signed)
Please advise refill? Last RX was done on 03-22-13 quantity 30 with 1.  If ok, fax to [phone]

## 2013-07-23 ENCOUNTER — Other Ambulatory Visit: Payer: Self-pay | Admitting: Family Medicine

## 2013-07-24 ENCOUNTER — Other Ambulatory Visit: Payer: Self-pay

## 2013-07-24 DIAGNOSIS — M549 Dorsalgia, unspecified: Secondary | ICD-10-CM

## 2013-07-24 MED ORDER — HYDROCODONE-ACETAMINOPHEN 7.5-325 MG PO TABS: 1.0000 | ORAL_TABLET | Freq: Four times a day (QID) | ORAL | Status: DC | PRN

## 2013-07-24 NOTE — Telephone Encounter (Signed)
Detailed message left for patient stating that rx was ready to be picked up

## 2013-07-24 NOTE — Telephone Encounter (Signed)
RX in front cabinet. Left a message stating that RX is ready to be picked up

## 2013-07-24 NOTE — Telephone Encounter (Signed)
Please advise? Last RX was done on 06-11-13 quantity 60 with 0 refills

## 2013-07-24 NOTE — Telephone Encounter (Signed)
Pt left a message stating she needs a refill on her pain medication.

## 2013-08-09 ENCOUNTER — Ambulatory Visit: Payer: 59

## 2013-08-09 ENCOUNTER — Ambulatory Visit (INDEPENDENT_AMBULATORY_CARE_PROVIDER_SITE_OTHER): Payer: 59 | Admitting: Emergency Medicine

## 2013-08-09 VITALS — BP 132/70 | HR 70 | Temp 98.4°F | Resp 16 | Ht 65.0 in | Wt 232.0 lb

## 2013-08-09 DIAGNOSIS — J3489 Other specified disorders of nose and nasal sinuses: Secondary | ICD-10-CM

## 2013-08-09 DIAGNOSIS — R059 Cough, unspecified: Secondary | ICD-10-CM

## 2013-08-09 DIAGNOSIS — R05 Cough: Secondary | ICD-10-CM

## 2013-08-09 DIAGNOSIS — R0981 Nasal congestion: Secondary | ICD-10-CM

## 2013-08-09 DIAGNOSIS — R0789 Other chest pain: Secondary | ICD-10-CM

## 2013-08-09 LAB — POCT INFLUENZA A/B
INFLUENZA A, POC: NEGATIVE
Influenza B, POC: NEGATIVE

## 2013-08-09 MED ORDER — AZITHROMYCIN 250 MG PO TABS: ORAL_TABLET | ORAL | Status: DC

## 2013-08-09 MED ORDER — ALBUTEROL SULFATE HFA 108 (90 BASE) MCG/ACT IN AERS: 2.0000 | INHALATION_SPRAY | RESPIRATORY_TRACT | Status: DC | PRN

## 2013-08-09 MED ORDER — ALBUTEROL SULFATE (2.5 MG/3ML) 0.083% IN NEBU
2.5000 mg | INHALATION_SOLUTION | Freq: Once | RESPIRATORY_TRACT | Status: AC
  Administered 2013-08-09: 2.5 mg via RESPIRATORY_TRACT

## 2013-08-09 NOTE — Patient Instructions (Signed)

## 2013-08-09 NOTE — Progress Notes (Signed)
Subjective:    Patient ID: Susan [name], female    DOB: 25-Apr-1951, 63 y.o.   MRN: [id number]  Chief Complaint  Patient presents with  . Cough    x 1 week   This chart was scribed for [name], MD by Zettie Pho, ED Scribe.   HPI Susan [name] is a 63 y.o. female with a history of sleep apnea (patient is on CPAP) who presents to [facility] complaining of a productive cough with gray/yellow-colored sputum with associated wheezes that she states has been ongoing for the past 3 months following a URI and sinusitis in January. Patient reports associated diffuse myalgias and chills onset yesterday. Patient denies any known fevers at home and she is afebrile at 98.4 here. She states that she has been exposed to sick contacts with similar symptoms at home. Patient also has a history of ductal carcinoma with lumpectomy, blood clots, fatty liver (non-alcoholic), HTN, hyperlipidemia, hyperglycemia, peripheral neuropathy, fibromyalgia. Patient has allergies to Chantix, penicillins, ciprofloxacin, Lyrica, Neurontin, tamoxifen, Ceclor, and sulfa antibiotics. Patient is a current every day smoker, 0.5 PPD.   Patient Active Problem List   Diagnosis Date Noted  . Cervical cancer screening 03/09/2013  . Seasonal allergies 09/12/2012  . Nasal polyp 08/30/2012  . SOM (serous otitis media) 08/30/2012  . Dysuria 07/20/2012  . Peripheral neuropathy 07/19/2012  . Cervical myofascial pain syndrome 06/08/2012  . Anemia 01/28/2012  . HTN (hypertension) 01/28/2012  . Hyperlipidemia 01/28/2012  . Hyperglycemia 01/28/2012  . Preventative health care 01/28/2012  . Pain of left heel 01/28/2012  . Neck pain 12/30/2011  . Muscle spasm 12/30/2011  . Anxiety and depression   . Osteoarthritis   . Cancer   . GERD (gastroesophageal reflux disease)   . Fibromyalgia   . Sleep apnea   . Obesity   . Osteopenia   . History of blood clots   . Fatty liver disease, nonalcoholic   . DDD  (degenerative disc disease)     Past Medical History  Diagnosis Date  . Sleep apnea     on CPAP  . Osteopenia   . Fatty liver disease, nonalcoholic   . Obesity   . History of blood clots 2004    during cancer treatment  . Anxiety   . Osteoarthritis   . DDD (degenerative disc disease)   . DJD (degenerative joint disease)   . Cancer 2004    ductal carcinoma; lumpectomy  . Depression   . GERD (gastroesophageal reflux disease)   . Fibromyalgia   . Peripheral neuropathy   . Muscle spasm 12/30/2011  . Anemia 01/28/2012  . HTN (hypertension) 01/28/2012  . Hyperlipidemia 01/28/2012  . Hyperglycemia 01/28/2012  . Preventative health care 01/28/2012  . Pain of left heel 01/28/2012  . Nasal polyp 08/30/2012   Current Outpatient Prescriptions on File Prior to Visit  Medication Sig Dispense Refill  . ALPRAZolam (XANAX) 0.25 MG tablet TAKE 1 TABLET BY MOUTH AT BEDTIME AS NEEDED FOR SLEEP OR ANXIETY  30 tablet  1  . calcium carbonate (OS-CAL) 600 MG TABS Take 1,800 mg by mouth daily.      . cetirizine (ZYRTEC) 10 MG tablet Take 10 mg by mouth daily.      . cholecalciferol (VITAMIN D) 1000 UNITS tablet Take 1,000 Units by mouth daily.      . cyclobenzaprine (FLEXERIL) 10 MG tablet TAKE 1 TABLET (10 MG TOTAL) BY MOUTH 2 (TWO) TIMES DAILY AS NEEDED FOR MUSCLE SPASMS.  30 tablet  1  . DULoxetine (CYMBALTA) 30 MG capsule Take 3 capsules (90 mg total) by mouth daily.  90 capsule  5  . esomeprazole (NEXIUM) 40 MG capsule Take 1 capsule (40 mg total) by mouth daily before breakfast.  30 capsule  5  . fenofibrate 160 MG tablet TAKE 1 TABLET BY MOUTH EVERY DAY  30 tablet  5  . fluticasone (FLONASE) 50 MCG/ACT nasal spray Place 1 spray into the nose 2 (two) times daily.      . hydrochlorothiazide (HYDRODIURIL) 25 MG tablet Take 1 tablet (25 mg total) by mouth daily.  30 tablet  5  . HYDROcodone-acetaminophen (NORCO) 7.5-325 MG per tablet Take 1 tablet by mouth every 6 (six) hours as needed.  60 tablet  0    . levothyroxine (SYNTHROID, LEVOTHROID) 25 MCG tablet TAKE 1 TABLET BY MOUTH EVERY DAY  30 tablet  5  . metoprolol (LOPRESSOR) 50 MG tablet Take 1 tablet (50 mg total) by mouth 2 (two) times daily.  60 tablet  3  . Multiple Vitamins-Minerals (MULTIVITAMIN WITH MINERALS) tablet Take 1 tablet by mouth daily.      Marland Kitchen olopatadine (PATANOL) 0.1 % ophthalmic solution Place 1 drop into both eyes 2 (two) times daily.  5 mL  2  . oseltamivir (TAMIFLU) 75 MG capsule Take 1 capsule (75 mg total) by mouth daily. For exposure prophylaxis  10 capsule  0  . temazepam (RESTORIL) 30 MG capsule Take 1 capsule (30 mg total) by mouth at bedtime as needed.  30 capsule  1   No current facility-administered medications on file prior to visit.   Allergies  Allergen Reactions  . Chantix [Varenicline]     "Felt like having mental breakdown"  . Penicillins Swelling  . Ciprofloxacin Other (See Comments)    Muscle tightness, tendon aching and pain  . Lyrica [Pregabalin] Swelling  . Neurontin [Gabapentin] Swelling  . Tamoxifen     Blood clots  . Ceclor [Cefaclor] Rash  . Sulfa Antibiotics Rash   Review of Systems  Constitutional: Positive for chills. Negative for fever.  Respiratory: Positive for cough and wheezing.   Musculoskeletal: Positive for myalgias.      Objective:   Physical Exam  CONSTITUTIONAL: Well developed/well nourished HEAD: Normocephalic/atraumatic EYES: EOMI/PERRL ENMT: Mucous membranes moist NECK: supple no meningeal signs SPINE:entire spine nontender CV: S1/S2 noted, no murmurs/rubs/gallops noted LUNGS: Lungs are clear to auscultation bilaterally, no apparent distress; wheezing and rhonchi bilaterally, but no rales or dullness  ABDOMEN: soft, nontender, no rebound or guarding GU:no cva tenderness NEURO: Pt is awake/alert, moves all extremitiesx4 EXTREMITIES: pulses normal, full ROM SKIN: warm, color normal PSYCH: no abnormalities of mood noted  BP 132/70  Pulse 70  Temp(Src)  98.4 F (36.9 C) (Oral)  Resp 16  Ht 5\' 5"  (1.651 m)  Wt 232 lb (105.235 kg)  BMI 38.61 kg/m2  SpO2 99%     Results for orders placed in visit on 08/09/13  POCT INFLUENZA A/B      Result Value Ref Range   Influenza A, POC Negative     Influenza B, POC Negative    [facility] reading (PRIMARY) by  Dr. Everlene Farrier there is a questionable mild lingular infiltrate.    Assessment & Plan:  12:05 PM- Will order an albuterol breathing treatment to manage symptoms. Will order an influenza test and a chest x-ray. Discussed treatment plan with patient at bedside and patient verbalized agreement. We'll treat with a Z-Pak and albuterol HFA inhaler.  Patient was advised she must stop smoking I personally performed the services described in this documentation, which was scribed in my presence. The recorded information has been reviewed and is accurate.

## 2013-08-24 ENCOUNTER — Telehealth: Payer: Self-pay | Admitting: Family Medicine

## 2013-08-24 DIAGNOSIS — M549 Dorsalgia, unspecified: Secondary | ICD-10-CM

## 2013-08-24 MED ORDER — HYDROCODONE-ACETAMINOPHEN 7.5-325 MG PO TABS: 1.0000 | ORAL_TABLET | Freq: Four times a day (QID) | ORAL | Status: DC | PRN

## 2013-08-24 NOTE — Telephone Encounter (Signed)
Last RX was done on 07-24-13 Quantity 60 with 0 refills  RX printed and put on mds desk for MD to sign. Pt informed she can pick this up tomorrow

## 2013-08-24 NOTE — Telephone Encounter (Signed)
Patient is requesting a new rx of vicoden. She would like to pick up rx tomorrow when she is seeing [facility] for a URI.

## 2013-08-25 ENCOUNTER — Ambulatory Visit (INDEPENDENT_AMBULATORY_CARE_PROVIDER_SITE_OTHER): Payer: 59 | Admitting: Family

## 2013-08-25 ENCOUNTER — Telehealth: Payer: Self-pay | Admitting: Family Medicine

## 2013-08-25 ENCOUNTER — Encounter: Payer: Self-pay | Admitting: Family

## 2013-08-25 VITALS — BP 142/80 | HR 74 | Temp 97.7°F | Resp 18 | Ht 65.0 in | Wt 234.1 lb

## 2013-08-25 DIAGNOSIS — J339 Nasal polyp, unspecified: Secondary | ICD-10-CM

## 2013-08-25 DIAGNOSIS — J302 Other seasonal allergic rhinitis: Secondary | ICD-10-CM

## 2013-08-25 DIAGNOSIS — J309 Allergic rhinitis, unspecified: Secondary | ICD-10-CM

## 2013-08-25 DIAGNOSIS — J45901 Unspecified asthma with (acute) exacerbation: Secondary | ICD-10-CM

## 2013-08-25 MED ORDER — BUDESONIDE-FORMOTEROL FUMARATE 160-4.5 MCG/ACT IN AERO: 2.0000 | INHALATION_SPRAY | Freq: Two times a day (BID) | RESPIRATORY_TRACT | Status: DC

## 2013-08-25 MED ORDER — METHYLPREDNISOLONE SODIUM SUCC 125 MG IJ SOLR
125.0000 mg | Freq: Once | INTRAMUSCULAR | Status: AC
  Administered 2013-08-25: 125 mg via INTRAMUSCULAR

## 2013-08-25 MED ORDER — PREDNISONE 10 MG PO TABS: ORAL_TABLET | ORAL | Status: DC

## 2013-08-25 NOTE — Progress Notes (Signed)
Pre visit review using our clinic review tool, if applicable. No additional management support is needed unless otherwise documented below in the visit note. 

## 2013-08-25 NOTE — Addendum Note (Signed)
Addended by: Kelle Darting A on: 08/25/2013 05:05 PM   Modules accepted: Orders

## 2013-08-25 NOTE — Assessment & Plan Note (Addendum)
Will rx with pred taper.  Solumedrol IM given today in the office.  Add symbicort and continue albuterol with spacer (she was having trouble using the MDI). I commended her on quitting smoking and advised her to try to remain quit.  Consider PFT's at a later visit to evaluate for probable underlying COPD.

## 2013-08-25 NOTE — Progress Notes (Signed)
Subjective:    Patient ID: Susan [name], female    DOB: 03/18/1951, 63 y.o.   MRN: [id number]  HPI  Susan [name] is a 63 yr old female smoker who presents today to discuss cough. Reports that cough started in December. Went to [facility] in January and was prescribed oral prednisone and zpak with minimal improvement. Reports + nasal drainage, mucous.  Has used mucinex without improvement.  Her last cigarette was on Sunday.  Wearing nicotine patch.She does reports that she continues nexium for GERD.  Generally well controlled.   Pt was seen at urgent care on 4/5 and was prescribed albuterol and zithromycin. CXR at that time noted bronchitic changes.  No significant improvement with these measures.   Nasal polyp- right nare- she did not keep appointment with Dr. Lucia Gaskins. She would now like to proceed with referral.    Review of Systems See HPI  Past Medical History  Diagnosis Date  . Sleep apnea     on CPAP  . Osteopenia   . Fatty liver disease, nonalcoholic   . Obesity   . History of blood clots 2004    during cancer treatment  . Anxiety   . Osteoarthritis   . DDD (degenerative disc disease)   . DJD (degenerative joint disease)   . Cancer 2004    ductal carcinoma; lumpectomy  . Depression   . GERD (gastroesophageal reflux disease)   . Fibromyalgia   . Peripheral neuropathy   . Muscle spasm 12/30/2011  . Anemia 01/28/2012  . HTN (hypertension) 01/28/2012  . Hyperlipidemia 01/28/2012  . Hyperglycemia 01/28/2012  . Preventative health care 01/28/2012  . Pain of left heel 01/28/2012  . Nasal polyp 08/30/2012    History   Social History  . Marital Status: Married    Spouse Name: N/A    Number of Children: N/A  . Years of Education: N/A   Occupational History  . Not on file.   Social History Main Topics  . Smoking status: Current Every Day Smoker -- 0.50 packs/day    Types: Cigarettes  . Smokeless tobacco: Never Used     Comment: Pt currently using nicotine patch.   tf,cma  . Alcohol Use: No  . Drug Use: No  . Sexual Activity: No   Other Topics Concern  . Not on file   Social History Narrative  . No narrative on file    Past Surgical History  Procedure Laterality Date  . Breast surgery  2004    lumpectomy  . Joint replacement      right total knee  . Bilateral oophorectomy  01/2003  . Nasal septum surgery    . Carpal tunnel release      right  . Abdominal hysterectomy  1991  . Thyroidectomy, partial  mid 80's    right side    Family History  Problem Relation Age of Onset  . Cancer Mother     liver cancer, hep c  . Heart disease Mother     chf  . Hypertension Mother   . Hyperlipidemia Mother   . Osteoporosis Mother   . Cancer Father     lung  . COPD Father   . Cancer Sister     breas at [place], non Hodgkin's Lymphoma  . Heart disease Sister     mvp  . Hypertension Brother   . Benign prostatic hyperplasia Brother   . Arthritis Brother   . Kidney disease Brother   . Other Daughter  Beal's Syndrome  . Arthritis Daughter     Beal's  . Arthritis Son     Beal's connective tissue disease  . Vision loss Maternal Grandmother   . Dementia Maternal Grandmother   . Vision loss Paternal Grandfather   . Hypertension Brother   . Benign prostatic hyperplasia Brother   . Cancer Brother     prostate  . Hypertension Brother   . COPD Brother   . Cancer Brother     prostate  . Hypertension Brother   . Hyperlipidemia Brother   . Cancer Brother     prostate  . Mental illness Brother     schizophrenia  . Cirrhosis Brother     hep c  . Hypertension Sister   . Other Sister     thalessemia, anemia  . Hyperlipidemia Sister   . Gout Sister   . Other Daughter     overweight    Allergies  Allergen Reactions  . Chantix [Varenicline]     "Felt like having mental breakdown"  . Penicillins Swelling  . Ciprofloxacin Other (See Comments)    Muscle tightness, tendon aching and pain  . Lyrica [Pregabalin] Swelling  . Neurontin  [Gabapentin] Swelling  . Tamoxifen     Blood clots  . Ceclor [Cefaclor] Rash  . Sulfa Antibiotics Rash    Current Outpatient Prescriptions on File Prior to Visit  Medication Sig Dispense Refill  . albuterol (PROVENTIL HFA;VENTOLIN HFA) 108 (90 BASE) MCG/ACT inhaler Inhale 2 puffs into the lungs every 4 (four) hours as needed for wheezing or shortness of breath (cough, shortness of breath or wheezing.).  1 Inhaler  1  . ALPRAZolam (XANAX) 0.25 MG tablet TAKE 1 TABLET BY MOUTH AT BEDTIME AS NEEDED FOR SLEEP OR ANXIETY  30 tablet  1  . calcium carbonate (OS-CAL) 600 MG TABS Take 1,800 mg by mouth daily.      . cetirizine (ZYRTEC) 10 MG tablet Take 10 mg by mouth daily.      . cholecalciferol (VITAMIN D) 1000 UNITS tablet Take 1,000 Units by mouth daily.      . cyclobenzaprine (FLEXERIL) 10 MG tablet TAKE 1 TABLET (10 MG TOTAL) BY MOUTH 2 (TWO) TIMES DAILY AS NEEDED FOR MUSCLE SPASMS.  30 tablet  1  . DULoxetine (CYMBALTA) 30 MG capsule Take 3 capsules (90 mg total) by mouth daily.  90 capsule  5  . esomeprazole (NEXIUM) 40 MG capsule Take 1 capsule (40 mg total) by mouth daily before breakfast.  30 capsule  5  . fenofibrate 160 MG tablet TAKE 1 TABLET BY MOUTH EVERY DAY  30 tablet  5  . fluticasone (FLONASE) 50 MCG/ACT nasal spray Place 1 spray into the nose 2 (two) times daily.      . hydrochlorothiazide (HYDRODIURIL) 25 MG tablet Take 1 tablet (25 mg total) by mouth daily.  30 tablet  5  . HYDROcodone-acetaminophen (NORCO) 7.5-325 MG per tablet Take 1 tablet by mouth every 6 (six) hours as needed.  60 tablet  0  . levothyroxine (SYNTHROID, LEVOTHROID) 25 MCG tablet TAKE 1 TABLET BY MOUTH EVERY DAY  30 tablet  5  . metoprolol (LOPRESSOR) 50 MG tablet Take 1 tablet (50 mg total) by mouth 2 (two) times daily.  60 tablet  3  . Multiple Vitamins-Minerals (MULTIVITAMIN WITH MINERALS) tablet Take 1 tablet by mouth daily.      Marland Kitchen olopatadine (PATANOL) 0.1 % ophthalmic solution Place 1 drop into both  eyes 2 (two) times daily.  5  mL  2  . temazepam (RESTORIL) 30 MG capsule Take 1 capsule (30 mg total) by mouth at bedtime as needed.  30 capsule  1   No current facility-administered medications on file prior to visit.    BP 142/80  Pulse 74  Temp(Src) 97.7 F (36.5 C) (Oral)  Resp 18  Ht 5\' 5"  (1.651 m)  Wt 234 lb 1.9 oz (106.196 kg)  BMI 38.96 kg/m2  SpO2 98%       Objective:   Physical Exam  Constitutional: She is oriented to person, place, and time. She appears well-developed and well-nourished. No distress.  HENT:  Head: Normocephalic and atraumatic.  Right Ear: Tympanic membrane and ear canal normal.  Left Ear: Tympanic membrane and ear canal normal.  Mouth/Throat: No oropharyngeal exudate, posterior oropharyngeal edema or posterior oropharyngeal erythema.  Unable to visualize polyp in right nare  Cardiovascular: Normal rate and regular rhythm.   No murmur heard. Pulmonary/Chest: Effort normal. No respiratory distress. She has no rales.  Bilateral expiratory wheeze R>L  Abdominal: Soft. Bowel sounds are normal.  Musculoskeletal: She exhibits no edema.  Neurological: She is alert and oriented to person, place, and time.  Psychiatric: She has a normal mood and affect. Her behavior is normal. Judgment and thought content normal.          Assessment & Plan:

## 2013-08-25 NOTE — Telephone Encounter (Signed)
Insurance will not cover Symbicort, suggest pt tries Alvesco, Dulera or Advair. Please Advise

## 2013-08-25 NOTE — Assessment & Plan Note (Signed)
Deteriorated.  Resume zyrtec and flonase.  I think this is worsening her asthma symptoms.

## 2013-08-25 NOTE — Assessment & Plan Note (Signed)
Reschedule ENT referral.

## 2013-08-25 NOTE — Patient Instructions (Addendum)
Restart zyrtec. Use flonase 2 sprays each nostril once daily. Start prednisone taper and symbicort. Use albuterol with spacer every 4-6 hours while awake for the next 1 week.  (Spray 1 puff of albuterol into spacer changer and take 5 slow breaths. Wait 1 minute then repeat).  Call if symptoms worsen or if symptoms do not improve. You may use delsym as needed for cough. Follow up with Dr. Charlett Blake in 1 week.

## 2013-08-25 NOTE — Telephone Encounter (Signed)
Relevant patient education assigned to patient using Emmi. ° °

## 2013-08-26 ENCOUNTER — Other Ambulatory Visit: Payer: Self-pay | Admitting: Family Medicine

## 2013-08-26 MED ORDER — FLUTICASONE-SALMETEROL 250-50 MCG/DOSE IN AEPB
1.0000 | INHALATION_SPRAY | Freq: Two times a day (BID) | RESPIRATORY_TRACT | Status: DC
Start: 2013-08-26 — End: 2014-12-20

## 2013-08-26 NOTE — Telephone Encounter (Signed)
Rx request to pharmacy/[facility]  

## 2013-08-26 NOTE — Telephone Encounter (Signed)
Notified pt and she voices understanding. 

## 2013-08-26 NOTE — Telephone Encounter (Signed)
OK to switch to advair. Rx sent. Please notify pt.

## 2013-08-26 NOTE — Telephone Encounter (Signed)
thanks

## 2013-09-04 ENCOUNTER — Ambulatory Visit: Payer: 59 | Admitting: Family Medicine

## 2013-09-07 ENCOUNTER — Ambulatory Visit: Payer: 59 | Admitting: Family Medicine

## 2013-09-10 ENCOUNTER — Telehealth: Payer: Self-pay | Admitting: Family Medicine

## 2013-09-10 MED ORDER — CYCLOBENZAPRINE HCL 10 MG PO TABS: 10.0000 mg | ORAL_TABLET | Freq: Two times a day (BID) | ORAL | Status: DC | PRN

## 2013-09-10 NOTE — Telephone Encounter (Signed)
Refill- cyclobenzaprine  [facility] # [phone] in [facility]

## 2013-09-14 ENCOUNTER — Ambulatory Visit: Payer: 59 | Admitting: Family Medicine

## 2013-10-05 ENCOUNTER — Ambulatory Visit: Payer: 59 | Admitting: Family Medicine

## 2013-10-12 ENCOUNTER — Telehealth: Payer: Self-pay | Admitting: Family Medicine

## 2013-10-12 ENCOUNTER — Encounter: Payer: Self-pay | Admitting: Family Medicine

## 2013-10-12 ENCOUNTER — Ambulatory Visit (INDEPENDENT_AMBULATORY_CARE_PROVIDER_SITE_OTHER): Payer: 59 | Admitting: Family Medicine

## 2013-10-12 VITALS — BP 122/72 | HR 73 | Temp 98.2°F | Ht 65.0 in | Wt 227.0 lb

## 2013-10-12 DIAGNOSIS — F419 Anxiety disorder, unspecified: Secondary | ICD-10-CM

## 2013-10-12 DIAGNOSIS — F341 Dysthymic disorder: Secondary | ICD-10-CM

## 2013-10-12 DIAGNOSIS — M25551 Pain in right hip: Secondary | ICD-10-CM | POA: Insufficient documentation

## 2013-10-12 DIAGNOSIS — F329 Major depressive disorder, single episode, unspecified: Secondary | ICD-10-CM

## 2013-10-12 DIAGNOSIS — M25559 Pain in unspecified hip: Secondary | ICD-10-CM

## 2013-10-12 DIAGNOSIS — M549 Dorsalgia, unspecified: Secondary | ICD-10-CM

## 2013-10-12 DIAGNOSIS — F17200 Nicotine dependence, unspecified, uncomplicated: Secondary | ICD-10-CM

## 2013-10-12 DIAGNOSIS — J302 Other seasonal allergic rhinitis: Secondary | ICD-10-CM

## 2013-10-12 DIAGNOSIS — Z720 Tobacco use: Secondary | ICD-10-CM

## 2013-10-12 DIAGNOSIS — J309 Allergic rhinitis, unspecified: Secondary | ICD-10-CM

## 2013-10-12 DIAGNOSIS — J45901 Unspecified asthma with (acute) exacerbation: Secondary | ICD-10-CM

## 2013-10-12 DIAGNOSIS — F32A Depression, unspecified: Secondary | ICD-10-CM

## 2013-10-12 DIAGNOSIS — I10 Essential (primary) hypertension: Secondary | ICD-10-CM

## 2013-10-12 DIAGNOSIS — E669 Obesity, unspecified: Secondary | ICD-10-CM

## 2013-10-12 HISTORY — DX: Tobacco use: Z72.0

## 2013-10-12 MED ORDER — HYDROCODONE-ACETAMINOPHEN 7.5-325 MG PO TABS: 1.0000 | ORAL_TABLET | Freq: Four times a day (QID) | ORAL | Status: DC | PRN

## 2013-10-12 MED ORDER — METOPROLOL TARTRATE 50 MG PO TABS: 50.0000 mg | ORAL_TABLET | Freq: Two times a day (BID) | ORAL | Status: DC

## 2013-10-12 MED ORDER — CYCLOBENZAPRINE HCL 10 MG PO TABS: 10.0000 mg | ORAL_TABLET | Freq: Two times a day (BID) | ORAL | Status: DC | PRN

## 2013-10-12 NOTE — Progress Notes (Signed)
Pre visit review using our clinic review tool, if applicable. No additional management support is needed unless otherwise documented below in the visit note. 

## 2013-10-12 NOTE — Assessment & Plan Note (Signed)
Well controlled, no changes to meds. Encouraged heart healthy diet such as the DASH diet and exercise as tolerated.  °

## 2013-10-12 NOTE — Assessment & Plan Note (Signed)
Zyrtec has been helpful

## 2013-10-12 NOTE — Assessment & Plan Note (Signed)
And left knee pain encouraged topical treaments such as Salon pas report if worsens

## 2013-10-12 NOTE — Assessment & Plan Note (Signed)
Has been on Cymbalta 90 mg for years, doing well but would like to try dropping her dosage, may drop to 60 mg and see how she does

## 2013-10-12 NOTE — Telephone Encounter (Signed)
Relevant patient education assigned to patient using Emmi. ° °

## 2013-10-12 NOTE — Assessment & Plan Note (Signed)
Encouraged complete cessation. Discussed need to quit as relates to risk of numerous cancers, cardiac and pulmonary disease as well as neurologic complications. Counseled for greater than 3 minutes. Can continue Nicoderm patches

## 2013-10-12 NOTE — Assessment & Plan Note (Signed)
Encouraged DASH diet, decrease po intake and increase exercise as tolerated. Needs 7-8 hours of sleep nightly. Avoid trans fats, eat small, frequent meals every 4-5 hours with lean proteins, complex carbs and healthy fats. Minimize simple carbs, GMO foods. 

## 2013-10-12 NOTE — Patient Instructions (Signed)

## 2013-10-12 NOTE — Progress Notes (Signed)
Patient ID: Susan [name], female   DOB: 01-Jan-1951, 63 y.o.   MRN: [id number] Susan [name] [id number] 1950/11/28 10/12/2013      Progress Note-Follow Up  Subjective  Chief Complaint  Chief Complaint  Patient presents with  . Follow-up    1 week and med refills    HPI  Patient is a 63 year old female in today for routine medical care. In today for followup. Has recently been treated for bronchitis but is improving. Still has cough and some congestion but it is minimal. No fevers or chills. She had stopped smoking for 12 days using a Nicoderm did smoke today. Is complaining of some posterior right hip pain as well as some intermittent left knee pain but no recent injury. No acute illness. Zyrtec has helped her allergies. Denies CP/palp/SOB/HA/congestion/fevers/GI or GU c/o. Taking meds as prescribed  Past Medical History  Diagnosis Date  . Sleep apnea     on CPAP  . Osteopenia   . Fatty liver disease, nonalcoholic   . Obesity   . History of blood clots 2004    during cancer treatment  . Anxiety   . Osteoarthritis   . DDD (degenerative disc disease)   . DJD (degenerative joint disease)   . Cancer 2004    ductal carcinoma; lumpectomy  . Depression   . GERD (gastroesophageal reflux disease)   . Fibromyalgia   . Peripheral neuropathy   . Muscle spasm 12/30/2011  . Anemia 01/28/2012  . HTN (hypertension) 01/28/2012  . Hyperlipidemia 01/28/2012  . Hyperglycemia 01/28/2012  . Preventative health care 01/28/2012  . Pain of left heel 01/28/2012  . Nasal polyp 08/30/2012  . Tobacco abuse-unspec 10/12/2013    Past Surgical History  Procedure Laterality Date  . Breast surgery  2004    lumpectomy  . Joint replacement      right total knee  . Bilateral oophorectomy  01/2003  . Nasal septum surgery    . Carpal tunnel release      right  . Abdominal hysterectomy  1991  . Thyroidectomy, partial  mid 80's    right side    Family History  Problem Relation Age of Onset  .  Cancer Mother     liver cancer, hep c  . Heart disease Mother     chf  . Hypertension Mother   . Hyperlipidemia Mother   . Osteoporosis Mother   . Cancer Father     lung  . COPD Father   . Cancer Sister     breas at [place], non Hodgkin's Lymphoma  . Heart disease Sister     mvp  . Hypertension Brother   . Benign prostatic hyperplasia Brother   . Arthritis Brother   . Kidney disease Brother   . Other Daughter     Beal's Syndrome  . Arthritis Daughter     Beal's  . Arthritis Son     Beal's connective tissue disease  . Vision loss Maternal Grandmother   . Dementia Maternal Grandmother   . Vision loss Paternal Grandfather   . Hypertension Brother   . Benign prostatic hyperplasia Brother   . Cancer Brother     prostate  . Hypertension Brother   . COPD Brother   . Cancer Brother     prostate  . Hypertension Brother   . Hyperlipidemia Brother   . Cancer Brother     prostate  . Mental illness Brother     schizophrenia  . Cirrhosis Brother  hep c  . Hypertension Sister   . Other Sister     thalessemia, anemia  . Hyperlipidemia Sister   . Gout Sister   . Other Daughter     overweight    History   Social History  . Marital Status: Married    Spouse Name: N/A    Number of Children: N/A  . Years of Education: N/A   Occupational History  . Not on file.   Social History Main Topics  . Smoking status: Current Every Day Smoker -- 0.50 packs/day    Types: Cigarettes  . Smokeless tobacco: Never Used     Comment: Pt currently using nicotine patch.  tf,cma  . Alcohol Use: No  . Drug Use: No  . Sexual Activity: No   Other Topics Concern  . Not on file   Social History Narrative  . No narrative on file    Current Outpatient Prescriptions on File Prior to Visit  Medication Sig Dispense Refill  . albuterol (PROVENTIL HFA;VENTOLIN HFA) 108 (90 BASE) MCG/ACT inhaler Inhale 2 puffs into the lungs every 4 (four) hours as needed for wheezing or shortness of breath  (cough, shortness of breath or wheezing.).  1 Inhaler  1  . ALPRAZolam (XANAX) 0.25 MG tablet TAKE 1 TABLET BY MOUTH AT BEDTIME AS NEEDED FOR SLEEP OR ANXIETY  30 tablet  1  . calcium carbonate (OS-CAL) 600 MG TABS Take 1,800 mg by mouth daily.      . cetirizine (ZYRTEC) 10 MG tablet Take 10 mg by mouth daily.      . cholecalciferol (VITAMIN D) 1000 UNITS tablet Take 1,000 Units by mouth daily.      . DULoxetine (CYMBALTA) 30 MG capsule Take 3 capsules (90 mg total) by mouth daily.  90 capsule  5  . esomeprazole (NEXIUM) 40 MG capsule Take 1 capsule (40 mg total) by mouth daily before breakfast.  30 capsule  5  . fenofibrate 160 MG tablet TAKE 1 TABLET BY MOUTH EVERY DAY  30 tablet  5  . fluticasone (FLONASE) 50 MCG/ACT nasal spray Place 1 spray into the nose 2 (two) times daily.      . Fluticasone-Salmeterol (ADVAIR DISKUS) 250-50 MCG/DOSE AEPB Inhale 1 puff into the lungs 2 (two) times daily.  60 each  2  . hydrochlorothiazide (HYDRODIURIL) 25 MG tablet Take 1 tablet (25 mg total) by mouth daily.  30 tablet  5  . levothyroxine (SYNTHROID, LEVOTHROID) 25 MCG tablet TAKE 1 TABLET BY MOUTH EVERY DAY  30 tablet  2  . Multiple Vitamins-Minerals (MULTIVITAMIN WITH MINERALS) tablet Take 1 tablet by mouth daily.      . nicotine (NICODERM CQ - DOSED IN MG/24 HOURS) 21 mg/24hr patch Place 21 mg onto the skin daily.      Marland Kitchen olopatadine (PATANOL) 0.1 % ophthalmic solution Place 1 drop into both eyes 2 (two) times daily.  5 mL  2  . temazepam (RESTORIL) 30 MG capsule Take 1 capsule (30 mg total) by mouth at bedtime as needed.  30 capsule  1   No current facility-administered medications on file prior to visit.    Allergies  Allergen Reactions  . Chantix [Varenicline]     "Felt like having mental breakdown"  . Penicillins Swelling  . Ciprofloxacin Other (See Comments)    Muscle tightness, tendon aching and pain  . Lyrica [Pregabalin] Swelling  . Neurontin [Gabapentin] Swelling  . Tamoxifen     Blood  clots  . Ceclor [Cefaclor] Rash  .  Sulfa Antibiotics Rash    Review of Systems  Review of Systems  Constitutional: Negative for fever and malaise/fatigue.  HENT: Negative for congestion.   Eyes: Negative for discharge.  Respiratory: Positive for cough. Negative for shortness of breath.   Cardiovascular: Negative for chest pain, palpitations and leg swelling.  Gastrointestinal: Negative for nausea, abdominal pain and diarrhea.  Genitourinary: Negative for dysuria.  Musculoskeletal: Positive for joint pain and myalgias. Negative for falls.       Right posterior hip pain and left knee pain  Skin: Negative for rash.  Neurological: Negative for loss of consciousness and headaches.  Endo/Heme/Allergies: Negative for polydipsia.  Psychiatric/Behavioral: Negative for depression and suicidal ideas. The patient is not nervous/anxious and does not have insomnia.     Objective  BP 122/72  Pulse 73  Temp(Src) 98.2 F (36.8 C) (Oral)  Ht 5\' 5"  (1.651 m)  Wt 227 lb (102.967 kg)  BMI 37.77 kg/m2  SpO2 99%  Physical Exam  Physical Exam  Constitutional: She is oriented to person, place, and time and well-developed, well-nourished, and in no distress. No distress.  HENT:  Head: Normocephalic and atraumatic.  Eyes: Conjunctivae are normal.  Neck: Neck supple. No thyromegaly present.  Cardiovascular: Normal rate, regular rhythm and normal heart sounds.   No murmur heard. Pulmonary/Chest: Effort normal and breath sounds normal. She has no wheezes.  Abdominal: She exhibits no distension and no mass.  Musculoskeletal: She exhibits no edema.  Lymphadenopathy:    She has no cervical adenopathy.  Neurological: She is alert and oriented to person, place, and time.  Skin: Skin is warm and dry. No rash noted. She is not diaphoretic.  Psychiatric: Memory, affect and judgment normal.    Lab Results  Component Value Date   TSH 1.245 03/02/2013   Lab Results  Component Value Date   WBC 5.7  03/02/2013   HGB 13.5 03/02/2013   HCT 39.2 03/02/2013   MCV 85.4 03/02/2013   PLT 274 03/02/2013   Lab Results  Component Value Date   CREATININE 0.87 03/02/2013   BUN 18 03/02/2013   NA 141 03/02/2013   K 4.0 03/02/2013   CL 102 03/02/2013   CO2 27 03/02/2013   Lab Results  Component Value Date   ALT 29 03/02/2013   AST 19 03/02/2013   ALKPHOS 52 03/02/2013   BILITOT 0.3 03/02/2013   Lab Results  Component Value Date   CHOL 123 03/02/2013   Lab Results  Component Value Date   HDL 38* 03/02/2013   Lab Results  Component Value Date   LDLCALC 59 03/02/2013   Lab Results  Component Value Date   TRIG 131 03/02/2013   Lab Results  Component Value Date   CHOLHDL 3.2 03/02/2013     Assessment & Plan  Obesity Encouraged DASH diet, decrease po intake and increase exercise as tolerated. Needs 7-8 hours of sleep nightly. Avoid trans fats, eat small, frequent meals every 4-5 hours with lean proteins, complex carbs and healthy fats. Minimize simple carbs, GMO foods.  HTN (hypertension) Well controlled, no changes to meds. Encouraged heart healthy diet such as the DASH diet and exercise as tolerated.   Tobacco abuse-unspec Encouraged complete cessation. Discussed need to quit as relates to risk of numerous cancers, cardiac and pulmonary disease as well as neurologic complications. Counseled for greater than 3 minutes. Can continue Nicoderm patches  Asthma with acute exacerbation Recently treated at urgent care. Doing much better. Encouraged probiotics  Seasonal allergies Zyrtec has  been helpful  Anxiety and depression Has been on Cymbalta 90 mg for years, doing well but would like to try dropping her dosage, may drop to 60 mg and see how she does  Hip pain, right And left knee pain encouraged topical treaments such as [facility] pas report if worsens

## 2013-10-12 NOTE — Assessment & Plan Note (Signed)
Recently treated at urgent care. Doing much better. Encouraged probiotics

## 2013-10-19 ENCOUNTER — Other Ambulatory Visit: Payer: Self-pay

## 2013-10-19 ENCOUNTER — Other Ambulatory Visit: Payer: Self-pay | Admitting: Family Medicine

## 2013-10-19 DIAGNOSIS — N6310 Unspecified lump in the right breast, unspecified quadrant: Secondary | ICD-10-CM

## 2013-10-31 ENCOUNTER — Other Ambulatory Visit: Payer: Self-pay | Admitting: Family Medicine

## 2013-11-02 ENCOUNTER — Other Ambulatory Visit: Payer: 59

## 2013-11-10 ENCOUNTER — Other Ambulatory Visit: Payer: 59

## 2013-11-13 ENCOUNTER — Telehealth: Payer: Self-pay

## 2013-11-13 MED ORDER — HYDROCODONE-ACETAMINOPHEN 7.5-325 MG PO TABS: 1.0000 | ORAL_TABLET | Freq: Four times a day (QID) | ORAL | Status: DC | PRN

## 2013-11-13 NOTE — Telephone Encounter (Signed)
Last RX wrote on 10-12-13 quantity 60 with 0 refills.  RX printed for md to sign and pt informed to come by this afternoon to pick up

## 2013-11-17 ENCOUNTER — Ambulatory Visit
Admission: RE | Admit: 2013-11-17 | Discharge: 2013-11-17 | Disposition: A | Discharge status: Home / Self Care | Payer: 59 | Source: Ambulatory Visit | Attending: Family Medicine | Admitting: Family Medicine

## 2013-11-17 ENCOUNTER — Encounter (INDEPENDENT_AMBULATORY_CARE_PROVIDER_SITE_OTHER): Payer: Self-pay

## 2013-11-17 DIAGNOSIS — N6310 Unspecified lump in the right breast, unspecified quadrant: Secondary | ICD-10-CM

## 2013-11-18 ENCOUNTER — Other Ambulatory Visit: Payer: Self-pay | Admitting: Family Medicine

## 2013-11-26 ENCOUNTER — Other Ambulatory Visit: Payer: Self-pay | Admitting: Family Medicine

## 2013-12-13 ENCOUNTER — Other Ambulatory Visit: Payer: Self-pay | Admitting: Family Medicine

## 2013-12-14 NOTE — Telephone Encounter (Signed)
CYCLOBENZAPRINE 10MG   LAST RX 10/12/2013, #30X1 LOV 10/12/13 FU 3 MONTHS PLEASE ADVISE ON REFILLS/LDM

## 2013-12-22 ENCOUNTER — Other Ambulatory Visit: Payer: Self-pay

## 2013-12-22 ENCOUNTER — Telehealth: Payer: Self-pay | Admitting: Family Medicine

## 2013-12-22 MED ORDER — HYDROCHLOROTHIAZIDE 25 MG PO TABS: 25.0000 mg | ORAL_TABLET | Freq: Every day | ORAL | Status: DC

## 2013-12-22 NOTE — Telephone Encounter (Signed)
Refill- hydrochlorothiazide  [facility] [id number]

## 2013-12-29 ENCOUNTER — Telehealth: Payer: Self-pay | Admitting: Family Medicine

## 2013-12-29 MED ORDER — ALPRAZOLAM 0.25 MG PO TABS: 0.2500 mg | ORAL_TABLET | Freq: Every evening | ORAL | Status: DC | PRN

## 2013-12-29 NOTE — Telephone Encounter (Signed)
Refill-alprazolam  [facility] [id number]

## 2013-12-29 NOTE — Telephone Encounter (Signed)
rx printed for md to sign and fax. Last RX was done on 07-24-13 quantity 30 with 0 refills

## 2014-01-06 ENCOUNTER — Other Ambulatory Visit: Payer: Self-pay

## 2014-01-06 DIAGNOSIS — F418 Other specified anxiety disorders: Secondary | ICD-10-CM

## 2014-01-06 MED ORDER — DULOXETINE HCL 30 MG PO CPEP: 90.0000 mg | ORAL_CAPSULE | Freq: Every day | ORAL | Status: DC

## 2014-01-18 ENCOUNTER — Telehealth: Payer: Self-pay | Admitting: Family Medicine

## 2014-01-18 MED ORDER — TEMAZEPAM 30 MG PO CAPS: 30.0000 mg | ORAL_CAPSULE | Freq: Every evening | ORAL | Status: DC | PRN

## 2014-01-18 MED ORDER — HYDROCODONE-ACETAMINOPHEN 7.5-325 MG PO TABS: 1.0000 | ORAL_TABLET | Freq: Four times a day (QID) | ORAL | Status: DC | PRN

## 2014-01-18 NOTE — Telephone Encounter (Signed)
JA'S printed and pt informed that this is ready to be picked up

## 2014-01-18 NOTE — Telephone Encounter (Signed)
OK to refill both meds with same strength, same sig and same # of Refills

## 2014-01-18 NOTE — Telephone Encounter (Signed)
Please advise refills?  Temazepam last RX was done 01-19-13 quantity 30 with 1 refill  Hydrocodone last RX was done on 11-13-13 quantity 60 with 0 refills

## 2014-01-18 NOTE — Telephone Encounter (Signed)
Caller name: Annalicia Relation to pt: self Call back number: [phone] [facility]acy[facility]  Reason for call:   Patient needs a refill on vicoden and temazapen.

## 2014-01-20 ENCOUNTER — Telehealth: Payer: Self-pay | Admitting: Family Medicine

## 2014-01-20 NOTE — Telephone Encounter (Signed)
Pt got a call that Vicoden and Temazepam were ready for pickup.  Did not see them up front.  Please advise and call pt.   [phone]

## 2014-01-21 NOTE — Telephone Encounter (Signed)
Per Rachel Bo he found the RX's under the C's

## 2014-01-21 NOTE — Telephone Encounter (Signed)
I checked the scan folder and the mail box.  Since Susan [name] is not here, I am the only person making rounds, and I didn't do any rounds yesterday.  Not sure where the scripts went.

## 2014-01-21 NOTE — Telephone Encounter (Signed)
Someone grabbed them from the scan pile and took to the front?

## 2014-01-21 NOTE — Telephone Encounter (Signed)
Contacted pt for pickup, lvm.

## 2014-01-28 ENCOUNTER — Other Ambulatory Visit: Payer: 59

## 2014-02-01 ENCOUNTER — Other Ambulatory Visit (INDEPENDENT_AMBULATORY_CARE_PROVIDER_SITE_OTHER): Payer: 59

## 2014-02-01 ENCOUNTER — Telehealth: Payer: Self-pay | Admitting: Lab

## 2014-02-01 DIAGNOSIS — E785 Hyperlipidemia, unspecified: Secondary | ICD-10-CM

## 2014-02-01 DIAGNOSIS — R7309 Other abnormal glucose: Secondary | ICD-10-CM

## 2014-02-01 DIAGNOSIS — I10 Essential (primary) hypertension: Secondary | ICD-10-CM

## 2014-02-01 DIAGNOSIS — R739 Hyperglycemia, unspecified: Secondary | ICD-10-CM

## 2014-02-01 NOTE — Telephone Encounter (Signed)
There are instructions on the patients last AVS  Lab order placed

## 2014-02-01 NOTE — Telephone Encounter (Signed)
Dr, Randel Pigg   Patient has no diagnosis code for blood work could you please put orders in.

## 2014-02-01 NOTE — Telephone Encounter (Signed)
Dr. Randel Pigg   I see this pt is coming in this mornining they have labs showing in system , but no diagnosis code please advice.

## 2014-02-02 LAB — RENAL FUNCTION PANEL
ALBUMIN: 4.4 g/dL (ref 3.5–5.2)
BUN: 17 mg/dL (ref 6–23)
CHLORIDE: 103 meq/L (ref 96–112)
CO2: 29 meq/L (ref 19–32)
Calcium: 9.4 mg/dL (ref 8.4–10.5)
Creatinine, Ser: 0.8 mg/dL (ref 0.4–1.2)
GFR: 73.85 mL/min (ref >60.00)
Glucose, Bld: 104 mg/dL — ABNORMAL HIGH (ref 70–99)
PHOSPHORUS: 3 mg/dL (ref 2.3–4.6)
POTASSIUM: 4.3 meq/L (ref 3.5–5.1)
Sodium: 138 mEq/L (ref 135–145)

## 2014-02-02 LAB — LIPID PANEL
CHOL/HDL RATIO: 4
Cholesterol: 142 mg/dL (ref 0–200)
HDL: 35.6 mg/dL — AB (ref >39.00)
LDL Cholesterol: 84 mg/dL (ref 0–99)
NonHDL: 106.4
TRIGLYCERIDES: 114 mg/dL (ref 0.0–149.0)
VLDL: 22.8 mg/dL (ref 0.0–40.0)

## 2014-02-02 LAB — HEPATIC FUNCTION PANEL
ALT: 38 U/L — AB (ref 0–35)
AST: 25 U/L (ref 0–37)
Albumin: 4.4 g/dL (ref 3.5–5.2)
Alkaline Phosphatase: 51 U/L (ref 39–117)
BILIRUBIN TOTAL: 0.5 mg/dL (ref 0.2–1.2)
Bilirubin, Direct: 0.1 mg/dL (ref 0.0–0.3)
TOTAL PROTEIN: 7.1 g/dL (ref 6.0–8.3)

## 2014-02-02 LAB — CBC
HCT: 39.8 % (ref 36.0–46.0)
HEMOGLOBIN: 13.3 g/dL (ref 12.0–15.0)
MCHC: 33.4 g/dL (ref 30.0–36.0)
MCV: 89.1 fl (ref 78.0–100.0)
PLATELETS: 255 10*3/uL (ref 150.0–400.0)
RBC: 4.47 Mil/uL (ref 3.87–5.11)
RDW: 13.5 % (ref 11.5–15.5)
WBC: 4.7 10*3/uL (ref 4.0–10.5)

## 2014-02-02 LAB — HEMOGLOBIN A1C: Hgb A1c MFr Bld: 6.2 % (ref 4.6–6.5)

## 2014-02-02 LAB — TSH: TSH: 1.01 u[IU]/mL (ref 0.35–4.50)

## 2014-02-04 ENCOUNTER — Ambulatory Visit: Payer: 59 | Admitting: Family Medicine

## 2014-02-04 ENCOUNTER — Telehealth: Payer: Self-pay | Admitting: *Deleted

## 2014-02-04 NOTE — Telephone Encounter (Signed)
Would not charge no show for this

## 2014-02-04 NOTE — Telephone Encounter (Signed)
Pt did not show for appointment 02/04/2014 at 3:15pm for 3 month follow up, but also has appointment for 3 month follow up scheduled for 02/12/2014 at 2:15pm.  Not sure if when she scheduled appt on 10/9 if todays appt should have been cancelled.  Both are still on her appointment list.

## 2014-02-05 NOTE — Telephone Encounter (Signed)
See below

## 2014-02-07 ENCOUNTER — Other Ambulatory Visit: Payer: Self-pay | Admitting: Family Medicine

## 2014-02-08 NOTE — Telephone Encounter (Signed)
Rx request to pharmacy/[facility]  

## 2014-02-12 ENCOUNTER — Encounter: Payer: Self-pay | Admitting: Family Medicine

## 2014-02-12 ENCOUNTER — Ambulatory Visit (INDEPENDENT_AMBULATORY_CARE_PROVIDER_SITE_OTHER): Payer: 59 | Admitting: Family Medicine

## 2014-02-12 VITALS — BP 112/63 | HR 85 | Temp 98.6°F | Ht 65.0 in | Wt 218.4 lb

## 2014-02-12 DIAGNOSIS — E039 Hypothyroidism, unspecified: Secondary | ICD-10-CM

## 2014-02-12 DIAGNOSIS — E785 Hyperlipidemia, unspecified: Secondary | ICD-10-CM

## 2014-02-12 DIAGNOSIS — R739 Hyperglycemia, unspecified: Secondary | ICD-10-CM

## 2014-02-12 DIAGNOSIS — E669 Obesity, unspecified: Secondary | ICD-10-CM

## 2014-02-12 DIAGNOSIS — Z23 Encounter for immunization: Secondary | ICD-10-CM

## 2014-02-12 DIAGNOSIS — I10 Essential (primary) hypertension: Secondary | ICD-10-CM

## 2014-02-12 NOTE — Progress Notes (Signed)
Pre visit review using our clinic review tool, if applicable. No additional management support is needed unless otherwise documented below in the visit note. 

## 2014-02-12 NOTE — Patient Instructions (Addendum)
Small, frequent meals with lean proteins and hi fiber proteins, 64 oz of clear fluids, restart Krill oil caps daily and consider ICOOL, exercise    Perimenopause Perimenopause is the time when your body begins to move into the menopause (no menstrual period for 12 straight months). It is a natural process. Perimenopause can begin 2-8 years before the menopause and usually lasts for 1 year after the menopause. During this time, your ovaries may or may not produce an egg. The ovaries vary in their production of estrogen and progesterone hormones each month. This can cause irregular menstrual periods, difficulty getting pregnant, vaginal bleeding between periods, and uncomfortable symptoms. CAUSES  Irregular production of the ovarian hormones, estrogen and progesterone, and not ovulating every month.  Other causes include:  Tumor of the pituitary gland in the brain.  Medical disease that affects the ovaries.  Radiation treatment.  Chemotherapy.  Unknown causes.  Heavy smoking and excessive alcohol intake can bring on perimenopause sooner. SIGNS AND SYMPTOMS   Hot flashes.  Night sweats.  Irregular menstrual periods.  Decreased sex drive.  Vaginal dryness.  Headaches.  Mood swings.  Depression.  Memory problems.  Irritability.  Tiredness.  Weight gain.  Trouble getting pregnant.  The beginning of losing bone cells (osteoporosis).  The beginning of hardening of the arteries (atherosclerosis). DIAGNOSIS  Your health care provider will make a diagnosis by analyzing your age, menstrual history, and symptoms. He or she will do a physical exam and note any changes in your body, especially your female organs. Female hormone tests may or may not be helpful depending on the amount of female hormones you produce and when you produce them. However, other hormone tests may be helpful to rule out other problems. TREATMENT  In some cases, no treatment is needed. The decision on  whether treatment is necessary during the perimenopause should be made by you and your health care provider based on how the symptoms are affecting you and your lifestyle. Various treatments are available, such as:  Treating individual symptoms with a specific medicine for that symptom.  Herbal medicines that can help specific symptoms.  Counseling.  Group therapy. HOME CARE INSTRUCTIONS   Keep track of your menstrual periods (when they occur, how heavy they are, how long between periods, and how long they last) as well as your symptoms and when they started.  Only take over-the-counter or prescription medicines as directed by your health care provider.  Sleep and rest.  Exercise.  Eat a diet that contains calcium (good for your bones) and soy (acts like the estrogen hormone).  Do not smoke.  Avoid alcoholic beverages.  Take vitamin supplements as recommended by your health care provider. Taking vitamin E may help in certain cases.  Take calcium and vitamin D supplements to help prevent bone loss.  Group therapy is sometimes helpful.  Acupuncture may help in some cases. SEEK MEDICAL CARE IF:   You have questions about any symptoms you are having.  You need a referral to a specialist (gynecologist, psychiatrist, or psychologist). SEEK IMMEDIATE MEDICAL CARE IF:   You have vaginal bleeding.  Your period lasts longer than 8 days.  Your periods are recurring sooner than 21 days.  You have bleeding after intercourse.  You have severe depression.  You have pain when you urinate.  You have severe headaches.  You have vision problems. Document Released: 05/31/2004 Document Revised: 02/11/2013 Document Reviewed: 11/20/2012 [facility] Patient Information 2015 [place], [place]. This information is not intended to replace  advice given to you by your health care provider. Make sure you discuss any questions you have with your health care provider.  

## 2014-02-14 ENCOUNTER — Encounter: Payer: Self-pay | Admitting: Family Medicine

## 2014-02-14 DIAGNOSIS — E039 Hypothyroidism, unspecified: Secondary | ICD-10-CM

## 2014-02-14 HISTORY — DX: Hypothyroidism, unspecified: E03.9

## 2014-02-14 NOTE — Assessment & Plan Note (Signed)
hgba1c acceptable, minimize simple carbs. Increase exercise as tolerated.  

## 2014-02-14 NOTE — Assessment & Plan Note (Signed)
On Levothyroxine, continue to monitor 

## 2014-02-14 NOTE — Assessment & Plan Note (Signed)
Encouraged heart healthy diet, increase exercise, avoid trans fats, consider a krill oil cap daily. Continue fenofibrate 

## 2014-02-14 NOTE — Progress Notes (Signed)
Patient ID: Susan [name], female   DOB: 1950/10/03, 63 y.o.   MRN: [id number] Susan [name] [id number] [birth year] 02/14/2014      Progress Note-Follow Up  Subjective  Chief Complaint  Chief Complaint  Patient presents with  . Follow-up    3 month  . Injections    flu    HPI  Patient is a 63 year old female in today for routine medical care. Has been doing fairly well. Has been very stressed about a difficutl relationship with a neighbor but is feeling better now. No suicidal ideation but increased anxiety. No recent illness. Denies CP/palp/SOB/HA/congestion/fevers/GI or GU c/o. Taking meds as prescribed  Past Medical History  Diagnosis Date  . Sleep apnea     on CPAP  . Osteopenia   . Fatty liver disease, nonalcoholic   . Obesity   . History of blood clots 2004    during cancer treatment  . Anxiety   . Osteoarthritis   . DDD (degenerative disc disease)   . DJD (degenerative joint disease)   . Cancer 2004    ductal carcinoma; lumpectomy  . Depression   . GERD (gastroesophageal reflux disease)   . Fibromyalgia   . Peripheral neuropathy   . Muscle spasm 12/30/2011  . Anemia 01/28/2012  . HTN (hypertension) 01/28/2012  . Hyperlipidemia 01/28/2012  . Hyperglycemia 01/28/2012  . Preventative health care 01/28/2012  . Pain of left heel 01/28/2012  . Nasal polyp 08/30/2012  . Tobacco abuse-unspec 10/12/2013  . Hip pain, right 10/12/2013  . Hypothyroidism 02/14/2014    Past Surgical History  Procedure Laterality Date  . Breast surgery  2004    lumpectomy  . Joint replacement      right total knee  . Bilateral oophorectomy  01/2003  . Nasal septum surgery    . Carpal tunnel release      right  . Abdominal hysterectomy  1991  . Thyroidectomy, partial  mid 80's    right side    Family History  Problem Relation Age of Onset  . Cancer Mother     liver cancer, hep c  . Heart disease Mother     chf  . Hypertension Mother   . Hyperlipidemia Mother   .  Osteoporosis Mother   . Cancer Father     lung  . COPD Father   . Cancer Sister     breas at [place], non Hodgkin's Lymphoma  . Heart disease Sister     mvp  . Hypertension Brother   . Benign prostatic hyperplasia Brother   . Arthritis Brother   . Kidney disease Brother   . Other Daughter     Beal's Syndrome  . Arthritis Daughter     Beal's  . Arthritis Son     Beal's connective tissue disease  . Vision loss Maternal Grandmother   . Dementia Maternal Grandmother   . Vision loss Paternal Grandfather   . Hypertension Brother   . Benign prostatic hyperplasia Brother   . Cancer Brother     prostate  . Hypertension Brother   . COPD Brother   . Cancer Brother     prostate  . Hypertension Brother   . Hyperlipidemia Brother   . Cancer Brother     prostate  . Mental illness Brother     schizophrenia  . Cirrhosis Brother     hep c  . Hypertension Sister   . Other Sister     thalessemia, anemia  . Hyperlipidemia Sister   .  Gout Sister   . Other Daughter     overweight    History   Social History  . Marital Status: Married    Spouse Name: N/A    Number of Children: N/A  . Years of Education: N/A   Occupational History  . Not on file.   Social History Main Topics  . Smoking status: Current Every Day Smoker -- 0.50 packs/day    Types: Cigarettes  . Smokeless tobacco: Never Used     Comment: Pt currently using nicotine patch.  tf,cma  . Alcohol Use: No  . Drug Use: No  . Sexual Activity: No   Other Topics Concern  . Not on file   Social History Narrative  . No narrative on file    Current Outpatient Prescriptions on File Prior to Visit  Medication Sig Dispense Refill  . albuterol (PROVENTIL HFA;VENTOLIN HFA) 108 (90 BASE) MCG/ACT inhaler Inhale 2 puffs into the lungs every 4 (four) hours as needed for wheezing or shortness of breath (cough, shortness of breath or wheezing.).  1 Inhaler  1  . ALPRAZolam (XANAX) 0.25 MG tablet Take 1 tablet (0.25 mg total) by  mouth at bedtime as needed for anxiety.  30 tablet  0  . calcium carbonate (OS-CAL) 600 MG TABS Take 1,800 mg by mouth daily.      . cetirizine (ZYRTEC) 10 MG tablet Take 10 mg by mouth daily.      . cholecalciferol (VITAMIN D) 1000 UNITS tablet Take 1,000 Units by mouth daily.      . cyclobenzaprine (FLEXERIL) 10 MG tablet TAKE 1 TABLET (10 MG TOTAL) BY MOUTH 2 (TWO) TIMES DAILY AS NEEDED FOR MUSCLE SPASMS.  30 tablet  1  . DULoxetine (CYMBALTA) 30 MG capsule Take 3 capsules (90 mg total) by mouth daily.  90 capsule  5  . esomeprazole (NEXIUM) 40 MG capsule Take 1 capsule (40 mg total) by mouth daily before breakfast.  30 capsule  5  . fenofibrate 160 MG tablet TAKE 1 TABLET BY MOUTH EVERY DAY  30 tablet  5  . fluticasone (FLONASE) 50 MCG/ACT nasal spray Place 1 spray into the nose 2 (two) times daily.      . Fluticasone-Salmeterol (ADVAIR DISKUS) 250-50 MCG/DOSE AEPB Inhale 1 puff into the lungs 2 (two) times daily.  60 each  2  . hydrochlorothiazide (HYDRODIURIL) 25 MG tablet Take 1 tablet (25 mg total) by mouth daily.  30 tablet  5  . HYDROcodone-acetaminophen (NORCO) 7.5-325 MG per tablet Take 1 tablet by mouth every 6 (six) hours as needed.  60 tablet  0  . levothyroxine (SYNTHROID, LEVOTHROID) 25 MCG tablet TAKE 1 TABLET BY MOUTH EVERY DAY  30 tablet  4  . metoprolol (LOPRESSOR) 50 MG tablet TAKE 1 TABLET (50 MG TOTAL) BY MOUTH 2 (TWO) TIMES DAILY.  60 tablet  0  . Multiple Vitamins-Minerals (MULTIVITAMIN WITH MINERALS) tablet Take 1 tablet by mouth daily.      . nicotine (NICODERM CQ - DOSED IN MG/24 HOURS) 21 mg/24hr patch Place 21 mg onto the skin daily.      Marland Kitchen olopatadine (PATANOL) 0.1 % ophthalmic solution Place 1 drop into both eyes 2 (two) times daily.  5 mL  2  . temazepam (RESTORIL) 30 MG capsule Take 1 capsule (30 mg total) by mouth at bedtime as needed.  30 capsule  1   No current facility-administered medications on file prior to visit.    Allergies  Allergen Reactions  .  Chantix [Varenicline]     "Felt like having mental breakdown"  . Penicillins Swelling  . Ciprofloxacin Other (See Comments)    Muscle tightness, tendon aching and pain  . Lyrica [Pregabalin] Swelling  . Neurontin [Gabapentin] Swelling  . Tamoxifen     Blood clots  . Ceclor [Cefaclor] Rash  . Sulfa Antibiotics Rash    Review of Systems  Review of Systems  Constitutional: Positive for malaise/fatigue. Negative for fever.  HENT: Negative for congestion.   Eyes: Negative for discharge.  Respiratory: Negative for shortness of breath.   Cardiovascular: Negative for chest pain, palpitations and leg swelling.  Gastrointestinal: Negative for nausea, abdominal pain and diarrhea.  Genitourinary: Negative for dysuria.  Musculoskeletal: Positive for back pain. Negative for falls.  Skin: Negative for rash.  Neurological: Negative for loss of consciousness and headaches.  Endo/Heme/Allergies: Negative for polydipsia.  Psychiatric/Behavioral: Positive for depression. Negative for suicidal ideas. The patient is nervous/anxious and has insomnia.     Objective  BP 112/63  Pulse 85  Temp(Src) 98.6 F (37 C) (Oral)  Ht 5\' 5"  (1.651 m)  Wt 218 lb 6.4 oz (99.066 kg)  BMI 36.34 kg/m2  SpO2 100%  Physical Exam  Physical Exam  Constitutional: She is oriented to person, place, and time and well-developed, well-nourished, and in no distress. No distress.  HENT:  Head: Normocephalic and atraumatic.  Eyes: Conjunctivae are normal.  Neck: Neck supple. No thyromegaly present.  Cardiovascular: Normal rate, regular rhythm and normal heart sounds.   No murmur heard. Pulmonary/Chest: Effort normal and breath sounds normal. She has no wheezes.  Abdominal: She exhibits no distension and no mass.  Musculoskeletal: She exhibits no edema.  Lymphadenopathy:    She has no cervical adenopathy.  Neurological: She is alert and oriented to person, place, and time.  Skin: Skin is warm and dry. No rash  noted. She is not diaphoretic.  Psychiatric: Memory, affect and judgment normal.    Lab Results  Component Value Date   TSH 1.01 02/02/2014   Lab Results  Component Value Date   WBC 4.7 02/02/2014   HGB 13.3 02/02/2014   HCT 39.8 02/02/2014   MCV 89.1 02/02/2014   PLT 255.0 02/02/2014   Lab Results  Component Value Date   CREATININE 0.8 02/02/2014   BUN 17 02/02/2014   NA 138 02/02/2014   K 4.3 02/02/2014   CL 103 02/02/2014   CO2 29 02/02/2014   Lab Results  Component Value Date   ALT 38* 02/02/2014   AST 25 02/02/2014   ALKPHOS 51 02/02/2014   BILITOT 0.5 02/02/2014   Lab Results  Component Value Date   CHOL 142 02/02/2014   Lab Results  Component Value Date   HDL 35.60* 02/02/2014   Lab Results  Component Value Date   LDLCALC 84 02/02/2014   Lab Results  Component Value Date   TRIG 114.0 02/02/2014   Lab Results  Component Value Date   CHOLHDL 4 02/02/2014     Assessment & Plan  HTN (hypertension) Well controlled, no changes to meds. Encouraged heart healthy diet such as the DASH diet and exercise as tolerated.   Hyperglycemia hgba1c acceptable, minimize simple carbs. Increase exercise as tolerated.  Hyperlipidemia Encouraged heart healthy diet, increase exercise, avoid trans fats, consider a krill oil cap daily. Continue fenofibrate  Obesity Encouraged DASH diet, decrease po intake and increase exercise as tolerated. Needs 7-8 hours of sleep nightly. Avoid trans fats, eat small, frequent meals every 4-5 hours with  lean proteins, complex carbs and healthy fats. Minimize simple carbs, GMO foods.  Hypothyroidism On Levothyroxine, continue to monitor

## 2014-02-14 NOTE — Assessment & Plan Note (Signed)
Encouraged DASH diet, decrease po intake and increase exercise as tolerated. Needs 7-8 hours of sleep nightly. Avoid trans fats, eat small, frequent meals every 4-5 hours with lean proteins, complex carbs and healthy fats. Minimize simple carbs, GMO foods. 

## 2014-02-14 NOTE — Assessment & Plan Note (Signed)
Well controlled, no changes to meds. Encouraged heart healthy diet such as the DASH diet and exercise as tolerated.  °

## 2014-03-11 ENCOUNTER — Other Ambulatory Visit: Payer: Self-pay | Admitting: Family Medicine

## 2014-03-12 ENCOUNTER — Other Ambulatory Visit: Payer: Self-pay | Admitting: Family Medicine

## 2014-03-12 NOTE — Telephone Encounter (Signed)
Deeksha Cotrell [phone] [facility] [place] called to check on a refill for cyclobenzaprine (FLEXERIL) 10 MG tablet

## 2014-03-24 ENCOUNTER — Telehealth: Payer: Self-pay | Admitting: Family Medicine

## 2014-03-24 NOTE — Telephone Encounter (Signed)
Caller name:Matteo, Elverta Relation to SF:SELT Call back [place]:  Reason for call: pt is needing rx HYDROcodone-acetaminophen (NORCO) 7.5-325 MG per tablet  And   ALPRAZolam (XANAX) 0.25 MG tablet   Please call when available for pick up.

## 2014-03-25 MED ORDER — ALPRAZOLAM 0.25 MG PO TABS: 0.2500 mg | ORAL_TABLET | Freq: Every evening | ORAL | Status: DC | PRN

## 2014-03-25 MED ORDER — HYDROCODONE-ACETAMINOPHEN 7.5-325 MG PO TABS: 1.0000 | ORAL_TABLET | Freq: Four times a day (QID) | ORAL | Status: DC | PRN

## 2014-03-25 NOTE — Telephone Encounter (Signed)
Please inform pt these are ready to be picked up and to bring a photo id to sign out rx's  Xanax last RX'd 12-29-13 quantity 30 with 0 refills  Hydrocodone last RX 01-18-14 quantity 60 with 0 refills  rxs printed for md to sign and pt can pick up

## 2014-03-26 NOTE — Telephone Encounter (Signed)
Informed patient of this.  °

## 2014-04-28 ENCOUNTER — Other Ambulatory Visit: Payer: Self-pay | Admitting: Family Medicine

## 2014-04-28 NOTE — Telephone Encounter (Signed)
Med filled.  

## 2014-05-07 DIAGNOSIS — R590 Localized enlarged lymph nodes: Secondary | ICD-10-CM

## 2014-05-07 DIAGNOSIS — R0789 Other chest pain: Secondary | ICD-10-CM

## 2014-05-07 DIAGNOSIS — Z87898 Personal history of other specified conditions: Secondary | ICD-10-CM

## 2014-05-07 HISTORY — DX: Localized enlarged lymph nodes: R59.0

## 2014-05-07 HISTORY — DX: Other chest pain: R07.89

## 2014-05-07 HISTORY — DX: Personal history of other specified conditions: Z87.898

## 2014-05-17 ENCOUNTER — Other Ambulatory Visit: Payer: Self-pay | Admitting: Family Medicine

## 2014-05-17 NOTE — Telephone Encounter (Signed)
3 months supply for Fenofibrate sent.  Pt has office visit previously scheduled for 07/22/14.  Rx supply sent to last until office visit.       EAL

## 2014-06-18 ENCOUNTER — Telehealth: Payer: Self-pay | Admitting: Family Medicine

## 2014-06-18 MED ORDER — HYDROCODONE-ACETAMINOPHEN 7.5-325 MG PO TABS: 1.0000 | ORAL_TABLET | Freq: Four times a day (QID) | ORAL | Status: DC | PRN

## 2014-06-18 MED ORDER — ALPRAZOLAM 0.25 MG PO TABS: 0.2500 mg | ORAL_TABLET | Freq: Every evening | ORAL | Status: DC | PRN

## 2014-06-18 NOTE — Telephone Encounter (Signed)
Medication Detail      Disp Refills Start End     ALPRAZolam (XANAX) 0.25 MG tablet 30 tablet 0 03/25/2014     Sig - Route: Take 1 tablet (0.25 mg total) by mouth at bedtime as needed for anxiety. - Oral    Class: Print    Medication Detail      Disp Refills Start End     HYDROcodone-acetaminophen (NORCO) 7.5-325 MG per tablet 60 tablet 0 03/25/2014     Sig - Route: Take 1 tablet by mouth every 6 (six) hours as needed. - Oral    Class: Print    LAST OV: 10.09.15 ROV: 4-mths [CPE], appointment scheduled for 03.17.16 Please Advise on refills/SLS

## 2014-06-18 NOTE — Telephone Encounter (Signed)
Rx printed for provider signature/sls

## 2014-06-18 NOTE — Telephone Encounter (Signed)
OK to refill both meds for a one month supply, same strength, same sig, same number

## 2014-06-18 NOTE — Telephone Encounter (Signed)
Caller name: Geneve, Kimpel Relation to pt: self  Call back number: [phone] Pharmacy:  Reason for call:  pt requesting a refill of  ALPRAZolam (XANAX) 0.25 MG tablet please send to  [id number] [place] [place] [address] [phone] (Phone) [phone] (Fax)       HYDROcodone-acetaminophen ([place]) 7.5-325 MG per tablet please call when RX is ready to pick up

## 2014-06-18 NOTE — Telephone Encounter (Signed)
Called the patient informed hardcopy for hydrocodone and Alprazolam are ready for pickup at the front desk.

## 2014-06-19 ENCOUNTER — Other Ambulatory Visit: Payer: Self-pay | Admitting: Family Medicine

## 2014-07-01 ENCOUNTER — Other Ambulatory Visit: Payer: Self-pay | Admitting: Family Medicine

## 2014-07-08 ENCOUNTER — Other Ambulatory Visit: Payer: Self-pay | Admitting: Family Medicine

## 2014-07-22 ENCOUNTER — Encounter: Payer: 59 | Admitting: Family Medicine

## 2014-08-05 ENCOUNTER — Other Ambulatory Visit: Payer: Self-pay | Admitting: Family Medicine

## 2014-08-09 ENCOUNTER — Other Ambulatory Visit: Payer: Self-pay | Admitting: Family Medicine

## 2014-08-09 DIAGNOSIS — N6489 Other specified disorders of breast: Secondary | ICD-10-CM

## 2014-08-17 ENCOUNTER — Ambulatory Visit
Admission: RE | Admit: 2014-08-17 | Discharge: 2014-08-17 | Disposition: A | Discharge status: Home / Self Care | Payer: 59 | Source: Ambulatory Visit | Attending: Family Medicine | Admitting: Family Medicine

## 2014-08-17 ENCOUNTER — Other Ambulatory Visit: Payer: Self-pay | Admitting: Family Medicine

## 2014-08-17 DIAGNOSIS — N6489 Other specified disorders of breast: Secondary | ICD-10-CM

## 2014-08-19 ENCOUNTER — Other Ambulatory Visit: Payer: Self-pay | Admitting: Family Medicine

## 2014-08-30 ENCOUNTER — Telehealth: Payer: Self-pay | Admitting: Family Medicine

## 2014-08-30 ENCOUNTER — Other Ambulatory Visit: Payer: Self-pay | Admitting: Family Medicine

## 2014-08-30 NOTE — Telephone Encounter (Signed)
Caller name: Brooklyne Relation to pt: self Call back number: [phone] Pharmacy:  Reason for call:   Requesting hydrocodone rx

## 2014-08-30 NOTE — Telephone Encounter (Signed)
It has been too long since I saw her she needs an appt for further refills and a UDS

## 2014-08-30 NOTE — Telephone Encounter (Signed)
Faxed hardcopy for Alprazolam to [facility].

## 2014-08-30 NOTE — Telephone Encounter (Signed)
Last office visit 02/12/14 Last refill 06/18/14  #60 with 0 refills.

## 2014-08-31 NOTE — Telephone Encounter (Signed)
Patient informed of PCP instructions regarding refill and will call back to schedule when convenient.

## 2014-09-20 ENCOUNTER — Ambulatory Visit (INDEPENDENT_AMBULATORY_CARE_PROVIDER_SITE_OTHER): Payer: 59 | Admitting: Family Medicine

## 2014-09-20 ENCOUNTER — Encounter: Payer: Self-pay | Admitting: Family Medicine

## 2014-09-20 VITALS — BP 145/67 | HR 70 | Temp 98.5°F | Resp 16 | Ht 64.5 in | Wt 234.2 lb

## 2014-09-20 DIAGNOSIS — G4700 Insomnia, unspecified: Secondary | ICD-10-CM | POA: Diagnosis not present

## 2014-09-20 DIAGNOSIS — M150 Primary generalized (osteo)arthritis: Secondary | ICD-10-CM | POA: Diagnosis not present

## 2014-09-20 DIAGNOSIS — Z720 Tobacco use: Secondary | ICD-10-CM | POA: Diagnosis not present

## 2014-09-20 DIAGNOSIS — M797 Fibromyalgia: Secondary | ICD-10-CM | POA: Diagnosis not present

## 2014-09-20 DIAGNOSIS — M159 Polyosteoarthritis, unspecified: Secondary | ICD-10-CM

## 2014-09-20 MED ORDER — HYDROCODONE-ACETAMINOPHEN 5-325 MG PO TABS: 1.0000 | ORAL_TABLET | Freq: Three times a day (TID) | ORAL | Status: DC | PRN

## 2014-09-20 MED ORDER — CYCLOBENZAPRINE HCL 10 MG PO TABS: ORAL_TABLET | ORAL | Status: DC

## 2014-09-20 MED ORDER — ALPRAZOLAM 0.5 MG PO TABS: 0.5000 mg | ORAL_TABLET | Freq: Every evening | ORAL | Status: DC | PRN

## 2014-09-20 NOTE — Progress Notes (Signed)
[facility] [address] [place] [address] [phone]  Date:  09/20/2014   Name:  Susan [name]   DOB:  Sep 13, 1950   MRN:  [id number]  PCP:  Penni Homans, MD    Chief Complaint: Establish Care; Insomnia; Fibromyalgia; Arthritis; and Hand Pain   History of Present Illness:  Susan [name] is a 64 y.o. very pleasant female patient who presents with the following:  She is here today for a regular visit- she had to leave her last PCP because she was not able to get timely appts due to changes in their office.  She would like to re-establish with her as her PCP She is interested in a particular health supplement company and would like to start several supplements for weight loss, nerve pain, other pain, etc. 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 Her father did die of lung cancer in his 50s.  She was not able to tolerate chantix.  She does use patches some of the time, and she smokes some of the time.  She may smoke 4- 5 packs a week now.   She has peripheral neuropathy, arthritis, "pinched nerves in my spine" and fibromyalgia   She has never seen a pulmonologist- she would like to do so  She did have breast cancer in 2004.  However she is in remission from this; she gets annual mammogram but this is all she has to do at this time.   She does have a history of anxiety and depression; she feels that this is under good control, and she is on cymbalta for this and for chronic pain.  She will have some anxiety off an on but "nothing that bad."  She will cycle in and out of insomnia. She has used temazepam and xanax as needed for sleep in the past.  She likes the xanax better  Her colonoscopy will be due next year.    She may use hydrocodone on occasion- her last fill was in February of this year. She uses it for back pain, muscle pain, and  "fibromyalgia related pain."  She may take it 2-3x a week.  She would like to wean off of this medication if possible  She takes her xanax 0.25- 2 of these- at bedtime as needed   She does notice SOB with exertion and cough for 2-3 years- she wonders about COPD.  Would like to see pulmonology for evaluation  Reviewed her controlled sub database and her last vicodin rx was for #60 06/18/14, 7.5 mg strength    Patient Active Problem List   Diagnosis Date Noted  . Hypothyroidism 02/14/2014  . Tobacco abuse-unspec 10/12/2013  . Hip pain, right 10/12/2013  . Asthma with acute exacerbation 08/25/2013  . Cervical cancer screening 03/09/2013  . Seasonal allergies 09/12/2012  . Nasal polyp 08/30/2012  . SOM (serous otitis media) 08/30/2012  . Dysuria 07/20/2012  . Peripheral neuropathy 07/19/2012  . Cervical myofascial pain syndrome 06/08/2012  . Anemia 01/28/2012  . HTN (hypertension) 01/28/2012  . Hyperlipidemia 01/28/2012  . Hyperglycemia 01/28/2012  . Preventative health care 01/28/2012  . Pain of left heel 01/28/2012  . Neck pain 12/30/2011  . Muscle spasm 12/30/2011  . Anxiety and depression   . Osteoarthritis   . Cancer   . GERD (gastroesophageal reflux disease)   . Fibromyalgia   .  Sleep apnea   . Obesity   . Osteopenia   . History of blood clots   . Fatty liver disease, nonalcoholic   . DDD (degenerative disc disease)     Past Medical History  Diagnosis Date  . Sleep apnea     on CPAP  . Osteopenia   . Fatty liver disease, nonalcoholic   . Obesity   . History of blood clots 2004    during cancer treatment  . Anxiety   . Osteoarthritis   . DDD (degenerative disc disease)   . DJD (degenerative joint disease)   . Cancer 2004    ductal carcinoma; lumpectomy  . Depression   . GERD (gastroesophageal reflux disease)   . Fibromyalgia   . Peripheral neuropathy   . Muscle spasm 12/30/2011  . Anemia 01/28/2012  . HTN (hypertension) 01/28/2012  . Hyperlipidemia  01/28/2012  . Hyperglycemia 01/28/2012  . Preventative health care 01/28/2012  . Pain of left heel 01/28/2012  . Nasal polyp 08/30/2012  . Tobacco abuse-unspec 10/12/2013  . Hip pain, right 10/12/2013  . Hypothyroidism 02/14/2014    Past Surgical History  Procedure Laterality Date  . Breast surgery  2004    lumpectomy  . Joint replacement      right total knee  . Bilateral oophorectomy  01/2003  . Nasal septum surgery    . Carpal tunnel release      right  . Abdominal hysterectomy  1991  . Thyroidectomy, partial  mid 80's    right side    History  Substance Use Topics  . Smoking status: Current Every Day Smoker -- 0.50 packs/day    Types: Cigarettes  . Smokeless tobacco: Never Used     Comment: Pt currently using nicotine patch.  tf,cma  . Alcohol Use: No    Family History  Problem Relation Age of Onset  . Cancer Mother     liver cancer, hep c  . Heart disease Mother     chf  . Hypertension Mother   . Hyperlipidemia Mother   . Osteoporosis Mother   . Cancer Father     lung  . COPD Father   . Cancer Sister     breas at [place], non Hodgkin's Lymphoma  . Heart disease Sister     mvp  . Hypertension Brother   . Benign prostatic hyperplasia Brother   . Arthritis Brother   . Kidney disease Brother   . Other Daughter     Beal's Syndrome  . Arthritis Daughter     Beal's  . Arthritis Son     Beal's connective tissue disease  . Vision loss Maternal Grandmother   . Dementia Maternal Grandmother   . Vision loss Paternal Grandfather   . Hypertension Brother   . Benign prostatic hyperplasia Brother   . Cancer Brother     prostate  . Hypertension Brother   . COPD Brother   . Cancer Brother     prostate  . Hypertension Brother   . Hyperlipidemia Brother   . Cancer Brother     prostate  . Mental illness Brother     schizophrenia  . Cirrhosis Brother     hep c  . Hypertension Sister   . Other Sister     thalessemia, anemia  . Hyperlipidemia Sister   . Gout Sister    . Other Daughter     overweight    Allergies  Allergen Reactions  . Chantix [Varenicline]     "Felt like having  mental breakdown"  . Penicillins Swelling  . Ciprofloxacin Other (See Comments)    Muscle tightness, tendon aching and pain  . Lyrica [Pregabalin] Swelling  . Neurontin [Gabapentin] Swelling  . Tamoxifen     Blood clots  . Ceclor [Cefaclor] Rash  . Sulfa Antibiotics Rash    Medication list has been reviewed and updated.  Current Outpatient Prescriptions on File Prior to Visit  Medication Sig Dispense Refill  . albuterol (PROVENTIL HFA;VENTOLIN HFA) 108 (90 BASE) MCG/ACT inhaler Inhale 2 puffs into the lungs every 4 (four) hours as needed for wheezing or shortness of breath (cough, shortness of breath or wheezing.). 1 Inhaler 1  . ALPRAZolam (XANAX) 0.25 MG tablet TAKE 1 TABLET BY MOUTH AT BEDTIME AS NEEDED FOR ANXIETY 30 tablet 0  . calcium carbonate (OS-CAL) 600 MG TABS Take 1,800 mg by mouth daily.    . cetirizine (ZYRTEC) 10 MG tablet Take 10 mg by mouth daily.    . cholecalciferol (VITAMIN D) 1000 UNITS tablet Take 1,000 Units by mouth daily.    . cyclobenzaprine (FLEXERIL) 10 MG tablet TAKE 1 TABLET (10 MG TOTAL) BY MOUTH 2 (TWO) TIMES DAILY AS NEEDED FOR MUSCLE SPASMS. 30 tablet 0  . DULoxetine (CYMBALTA) 30 MG capsule TAKE 3 CAPSULES BY MOUTH DAILY 90 capsule 5  . esomeprazole (NEXIUM) 40 MG capsule Take 1 capsule (40 mg total) by mouth daily before breakfast. 30 capsule 5  . fenofibrate 160 MG tablet TAKE 1 TABLET BY MOUTH EVERY DAY 30 tablet 2  . fluticasone (FLONASE) 50 MCG/ACT nasal spray Place 1 spray into the nose 2 (two) times daily.    . Fluticasone-Salmeterol (ADVAIR DISKUS) 250-50 MCG/DOSE AEPB Inhale 1 puff into the lungs 2 (two) times daily. 60 each 2  . hydrochlorothiazide (HYDRODIURIL) 25 MG tablet TAKE 1 TABLET (25 MG TOTAL) BY MOUTH DAILY. 30 tablet 5  . HYDROcodone-acetaminophen (NORCO) 7.5-325 MG per tablet Take 1 tablet by mouth every 6 (six)  hours as needed. 60 tablet 0  . levothyroxine (SYNTHROID, LEVOTHROID) 25 MCG tablet TAKE 1 TABLET BY MOUTH EVERY DAY 30 tablet 2  . metoprolol (LOPRESSOR) 50 MG tablet TAKE 1 TABLET (50 MG TOTAL) BY MOUTH 2 (TWO) TIMES DAILY. 60 tablet 4  . Multiple Vitamins-Minerals (MULTIVITAMIN WITH MINERALS) tablet Take 1 tablet by mouth daily.    . nicotine (NICODERM CQ - DOSED IN MG/24 HOURS) 21 mg/24hr patch Place 21 mg onto the skin daily.    Marland Kitchen olopatadine (PATANOL) 0.1 % ophthalmic solution Place 1 drop into both eyes 2 (two) times daily. 5 mL 2  . temazepam (RESTORIL) 30 MG capsule Take 1 capsule (30 mg total) by mouth at bedtime as needed. 30 capsule 1  . levothyroxine (SYNTHROID, LEVOTHROID) 25 MCG tablet TAKE 1 TABLET BY MOUTH EVERY DAY (Patient not taking: Reported on 09/20/2014) 30 tablet 2   No current facility-administered medications on file prior to visit.    Review of Systems:  As per HPI- otherwise negative.   Physical Examination: Filed Vitals:   09/20/14 1042  BP: 145/67  Pulse: 70  Temp: 98.5 F (36.9 C)  Resp: 16   Filed Vitals:   09/20/14 1042  Height: 5' 4.5" (1.638 m)  Weight: 234 lb 3.2 oz (106.232 kg)   Body mass index is 39.59 kg/(m^2). Ideal Body Weight: Weight in (lb) to have BMI = 25: 147.6  GEN: WDWN, NAD, Non-toxic, A & O x 3, obese, looks well HEENT: Atraumatic, Normocephalic. Neck supple. No masses, No LAD.  Ears and Nose: No external deformity. CV: RRR, No M/G/R. No JVD. No thrill. No extra heart sounds. PULM: CTA B, no wheezes, crackles, rhonchi. No retractions. No resp. distress. No accessory muscle use. EXTR: No c/c/e NEURO Normal gait.  PSYCH: Normally interactive. Conversant. Not depressed or anxious appearing.  Calm demeanor.  Degenerative change of right long finger DIP joint   Assessment and Plan: Fibromyalgia muscle pain - Plan: cyclobenzaprine (FLEXERIL) 10 MG tablet, HYDROcodone-acetaminophen (NORCO/VICODIN) 5-325 MG per tablet  Primary  osteoarthritis involving multiple joints  Tobacco abuse - Plan: Ambulatory referral to Pulmonology  Insomnia - Plan: ALPRAZolam (XANAX) 0.5 MG tablet  Wrote her for 0.5 mg xanax, and a small supply of hydrocodone.   See patient instructions for more details.   Plan to recheck in 3 months   Signed Lamar Blinks, MD

## 2014-09-20 NOTE — Patient Instructions (Addendum)
Recently, a new recommendation has been made regarding screening for lung cancer using annual "low dose" CT scanning.  This service is recommended for people who are 58- 64 years old, who currently smoke or quit in the last 15 years, and who smoked at least a pack per day for 30 years or more.    Some patients who are at least 64 years old and who smoked a pack per day for 20 years, or were exposed to second hand smoke may also qualify for screening.    In [place] this service is available at [facility], [phone]. The exam costs about $300 but may be covered by insurance.    Certainly you can try the supplements that you described. They are likely not dangerous but you might be better off spending money on healthy food!  Let's step down to the 5mg  strength of hydrocodone with a goal of stopping use I did refill your xanax at the 0.5 mg strength.  Use this only as needed as it is habit forming

## 2014-09-23 ENCOUNTER — Emergency Department (HOSPITAL_COMMUNITY): Payer: 59

## 2014-09-23 ENCOUNTER — Encounter (HOSPITAL_COMMUNITY): Payer: Self-pay | Admitting: Emergency Medicine

## 2014-09-23 ENCOUNTER — Observation Stay (HOSPITAL_COMMUNITY)
Admission: EM | Admit: 2014-09-23 | Discharge: 2014-09-24 | Disposition: A | Discharge status: Home / Self Care | Payer: 59 | Attending: Family Medicine | Admitting: Family Medicine

## 2014-09-23 DIAGNOSIS — D649 Anemia, unspecified: Secondary | ICD-10-CM | POA: Diagnosis not present

## 2014-09-23 DIAGNOSIS — R0602 Shortness of breath: Secondary | ICD-10-CM | POA: Insufficient documentation

## 2014-09-23 DIAGNOSIS — E669 Obesity, unspecified: Secondary | ICD-10-CM | POA: Insufficient documentation

## 2014-09-23 DIAGNOSIS — M62838 Other muscle spasm: Secondary | ICD-10-CM | POA: Diagnosis not present

## 2014-09-23 DIAGNOSIS — M1990 Unspecified osteoarthritis, unspecified site: Secondary | ICD-10-CM | POA: Insufficient documentation

## 2014-09-23 DIAGNOSIS — F418 Other specified anxiety disorders: Secondary | ICD-10-CM | POA: Diagnosis not present

## 2014-09-23 DIAGNOSIS — F32A Depression, unspecified: Secondary | ICD-10-CM | POA: Diagnosis present

## 2014-09-23 DIAGNOSIS — G4730 Sleep apnea, unspecified: Secondary | ICD-10-CM | POA: Diagnosis not present

## 2014-09-23 DIAGNOSIS — G4733 Obstructive sleep apnea (adult) (pediatric): Secondary | ICD-10-CM | POA: Diagnosis present

## 2014-09-23 DIAGNOSIS — Z9981 Dependence on supplemental oxygen: Secondary | ICD-10-CM | POA: Diagnosis not present

## 2014-09-23 DIAGNOSIS — Z79899 Other long term (current) drug therapy: Secondary | ICD-10-CM | POA: Insufficient documentation

## 2014-09-23 DIAGNOSIS — E039 Hypothyroidism, unspecified: Secondary | ICD-10-CM | POA: Diagnosis present

## 2014-09-23 DIAGNOSIS — J339 Nasal polyp, unspecified: Secondary | ICD-10-CM | POA: Diagnosis not present

## 2014-09-23 DIAGNOSIS — M542 Cervicalgia: Secondary | ICD-10-CM | POA: Diagnosis present

## 2014-09-23 DIAGNOSIS — E785 Hyperlipidemia, unspecified: Secondary | ICD-10-CM | POA: Diagnosis not present

## 2014-09-23 DIAGNOSIS — I10 Essential (primary) hypertension: Secondary | ICD-10-CM | POA: Diagnosis present

## 2014-09-23 DIAGNOSIS — Z8589 Personal history of malignant neoplasm of other organs and systems: Secondary | ICD-10-CM | POA: Insufficient documentation

## 2014-09-23 DIAGNOSIS — Z880 Allergy status to penicillin: Secondary | ICD-10-CM | POA: Diagnosis not present

## 2014-09-23 DIAGNOSIS — M8580 Other specified disorders of bone density and structure, unspecified site: Secondary | ICD-10-CM | POA: Diagnosis not present

## 2014-09-23 DIAGNOSIS — F329 Major depressive disorder, single episode, unspecified: Secondary | ICD-10-CM | POA: Diagnosis not present

## 2014-09-23 DIAGNOSIS — Z720 Tobacco use: Secondary | ICD-10-CM | POA: Insufficient documentation

## 2014-09-23 DIAGNOSIS — R002 Palpitations: Secondary | ICD-10-CM | POA: Diagnosis not present

## 2014-09-23 DIAGNOSIS — R000 Tachycardia, unspecified: Secondary | ICD-10-CM | POA: Diagnosis not present

## 2014-09-23 DIAGNOSIS — R079 Chest pain, unspecified: Principal | ICD-10-CM | POA: Insufficient documentation

## 2014-09-23 DIAGNOSIS — Z7951 Long term (current) use of inhaled steroids: Secondary | ICD-10-CM | POA: Insufficient documentation

## 2014-09-23 DIAGNOSIS — G629 Polyneuropathy, unspecified: Secondary | ICD-10-CM | POA: Diagnosis not present

## 2014-09-23 DIAGNOSIS — K760 Fatty (change of) liver, not elsewhere classified: Secondary | ICD-10-CM | POA: Insufficient documentation

## 2014-09-23 DIAGNOSIS — M797 Fibromyalgia: Secondary | ICD-10-CM | POA: Insufficient documentation

## 2014-09-23 DIAGNOSIS — F419 Anxiety disorder, unspecified: Secondary | ICD-10-CM | POA: Diagnosis not present

## 2014-09-23 DIAGNOSIS — K219 Gastro-esophageal reflux disease without esophagitis: Secondary | ICD-10-CM | POA: Insufficient documentation

## 2014-09-23 LAB — CBC WITH DIFFERENTIAL/PLATELET
BASOS ABS: 0 10*3/uL (ref 0.0–0.1)
Basophils Relative: 0 % (ref 0–1)
EOS ABS: 0.2 10*3/uL (ref 0.0–0.7)
EOS PCT: 3 % (ref 0–5)
HCT: 40.1 % (ref 36.0–46.0)
Hemoglobin: 14 g/dL (ref 12.0–15.0)
Lymphocytes Relative: 48 % — ABNORMAL HIGH (ref 12–46)
Lymphs Abs: 3.1 10*3/uL (ref 0.7–4.0)
MCH: 29.4 pg (ref 26.0–34.0)
MCHC: 34.9 g/dL (ref 30.0–36.0)
MCV: 84.1 fL (ref 78.0–100.0)
MONO ABS: 0.5 10*3/uL (ref 0.1–1.0)
Monocytes Relative: 8 % (ref 3–12)
Neutro Abs: 2.7 10*3/uL (ref 1.7–7.7)
Neutrophils Relative %: 41 % — ABNORMAL LOW (ref 43–77)
PLATELETS: 278 10*3/uL (ref 150–400)
RBC: 4.77 MIL/uL (ref 3.87–5.11)
RDW: 12.3 % (ref 11.5–15.5)
WBC: 6.5 10*3/uL (ref 4.0–10.5)

## 2014-09-23 LAB — I-STAT CHEM 8, ED
BUN: 18 mg/dL (ref 6–20)
CALCIUM ION: 1.16 mmol/L (ref 1.13–1.30)
CHLORIDE: 100 mmol/L — AB (ref 101–111)
Creatinine, Ser: 0.9 mg/dL (ref 0.44–1.00)
GLUCOSE: 96 mg/dL (ref 65–99)
HCT: 42 % (ref 36.0–46.0)
HEMOGLOBIN: 14.3 g/dL (ref 12.0–15.0)
POTASSIUM: 3.4 mmol/L — AB (ref 3.5–5.1)
SODIUM: 142 mmol/L (ref 135–145)
TCO2: 23 mmol/L (ref 0–100)

## 2014-09-23 LAB — I-STAT TROPONIN, ED
TROPONIN I, POC: 0.01 ng/mL (ref 0.00–0.08)
TROPONIN I, POC: 0 ng/mL (ref 0.00–0.08)

## 2014-09-23 LAB — D-DIMER, QUANTITATIVE (NOT AT ARMC): D DIMER QUANT: 0.79 ug{FEU}/mL — AB (ref 0.00–0.48)

## 2014-09-23 MED ORDER — ASPIRIN 81 MG PO CHEW
324.0000 mg | CHEWABLE_TABLET | Freq: Once | ORAL | Status: AC
  Administered 2014-09-23: 324 mg via ORAL
  Filled 2014-09-23: qty 4

## 2014-09-23 MED ORDER — MORPHINE SULFATE 2 MG/ML IJ SOLN
2.0000 mg | Freq: Once | INTRAMUSCULAR | Status: AC
  Administered 2014-09-23: 2 mg via INTRAVENOUS
  Filled 2014-09-23: qty 1

## 2014-09-23 MED ORDER — NITROGLYCERIN 0.4 MG SL SUBL
0.4000 mg | SUBLINGUAL_TABLET | SUBLINGUAL | Status: AC | PRN
  Administered 2014-09-23 (×3): 0.4 mg via SUBLINGUAL
  Filled 2014-09-23: qty 1

## 2014-09-23 MED ORDER — IOHEXOL 350 MG/ML SOLN
100.0000 mL | Freq: Once | INTRAVENOUS | Status: AC | PRN
  Administered 2014-09-23: 100 mL via INTRAVENOUS

## 2014-09-23 MED ORDER — LORAZEPAM 2 MG/ML IJ SOLN
1.0000 mg | Freq: Once | INTRAMUSCULAR | Status: AC
  Administered 2014-09-23: 1 mg via INTRAVENOUS
  Filled 2014-09-23: qty 1

## 2014-09-23 NOTE — ED Provider Notes (Addendum)
CSN: [id number]     Arrival date & time 09/23/14  1642 History   First MD Initiated Contact with Patient 09/23/14 1757     Chief Complaint  Patient presents with  . Chest Pain     (Consider location/radiation/quality/duration/timing/severity/associated sxs/prior Treatment) Patient is a 64 y.o. female presenting with chest pain. The history is provided by the patient.  Chest Pain Pain location:  Substernal area Pain quality: pressure and tightness   Pain radiates to:  Does not radiate Pain radiates to the back: no   Pain severity:  Moderate Onset quality:  Gradual Duration:  4 hours Timing:  Constant Progression:  Unchanged Chronicity:  New Context comment:  Started spontaneously about 1:30 today Relieved by:  Nothing Exacerbated by: maybe a little worse with exertion. Ineffective treatments:  None tried Associated symptoms: fatigue, palpitations and shortness of breath   Associated symptoms: no abdominal pain, no anorexia, no cough, no diaphoresis, no dizziness, no fever, no headache, no lower extremity edema, no nausea, no syncope and not vomiting   Risk factors: high cholesterol, hypertension and smoking   Risk factors: no immobilization, not obese and no surgery   Risk factors comment:  PE when she was placed on tamoxifen 12 years ago but no issues with clotting since   Past Medical History  Diagnosis Date  . Sleep apnea     on CPAP  . Osteopenia   . Fatty liver disease, nonalcoholic   . Obesity   . History of blood clots 2004    during cancer treatment  . Anxiety   . Osteoarthritis   . DDD (degenerative disc disease)   . DJD (degenerative joint disease)   . Cancer 2004    ductal carcinoma; lumpectomy  . Depression   . GERD (gastroesophageal reflux disease)   . Fibromyalgia   . Peripheral neuropathy   . Muscle spasm 12/30/2011  . Anemia 01/28/2012  . HTN (hypertension) 01/28/2012  . Hyperlipidemia 01/28/2012  . Hyperglycemia 01/28/2012  . Preventative health  care 01/28/2012  . Pain of left heel 01/28/2012  . Nasal polyp 08/30/2012  . Tobacco abuse-unspec 10/12/2013  . Hip pain, right 10/12/2013  . Hypothyroidism 02/14/2014   Past Surgical History  Procedure Laterality Date  . Breast surgery  2004    lumpectomy  . Joint replacement      right total knee  . Bilateral oophorectomy  01/2003  . Nasal septum surgery    . Carpal tunnel release      right  . Abdominal hysterectomy  1991  . Thyroidectomy, partial  mid 80's    right side   Family History  Problem Relation Age of Onset  . Cancer Mother     liver cancer, hep c  . Heart disease Mother     chf  . Hypertension Mother   . Hyperlipidemia Mother   . Osteoporosis Mother   . Cancer Father     lung  . COPD Father   . Cancer Sister     breas at [place], non Hodgkin's Lymphoma  . Heart disease Sister     mvp  . Hypertension Brother   . Benign prostatic hyperplasia Brother   . Arthritis Brother   . Kidney disease Brother   . Other Daughter     Beal's Syndrome  . Arthritis Daughter     Beal's  . Arthritis Son     Beal's connective tissue disease  . Vision loss Maternal Grandmother   . Dementia Maternal Grandmother   .  Vision loss Paternal Grandfather   . Hypertension Brother   . Benign prostatic hyperplasia Brother   . Cancer Brother     prostate  . Hypertension Brother   . COPD Brother   . Cancer Brother     prostate  . Hypertension Brother   . Hyperlipidemia Brother   . Cancer Brother     prostate  . Mental illness Brother     schizophrenia  . Cirrhosis Brother     hep c  . Hypertension Sister   . Other Sister     thalessemia, anemia  . Hyperlipidemia Sister   . Gout Sister   . Other Daughter     overweight   History  Substance Use Topics  . Smoking status: Current Every Day Smoker -- 0.50 packs/day    Types: Cigarettes  . Smokeless tobacco: Never Used     Comment: Pt currently using nicotine patch.  tf,cma  . Alcohol Use: No   OB History    No data  available     Review of Systems  Constitutional: Positive for fatigue. Negative for fever and diaphoresis.  Respiratory: Positive for shortness of breath. Negative for cough.   Cardiovascular: Positive for chest pain and palpitations. Negative for syncope.  Gastrointestinal: Negative for nausea, vomiting, abdominal pain and anorexia.  Neurological: Negative for dizziness and headaches.  All other systems reviewed and are negative.     Allergies  Chantix; Penicillins; Ciprofloxacin; Lyrica; Neurontin; Tamoxifen; Ceclor; and Sulfa antibiotics  Home Medications   Prior to Admission medications   Medication Sig Start Date End Date Taking? Authorizing Provider  albuterol (PROVENTIL HFA;VENTOLIN HFA) 108 (90 BASE) MCG/ACT inhaler Inhale 2 puffs into the lungs every 4 (four) hours as needed for wheezing or shortness of breath (cough, shortness of breath or wheezing.). 08/09/13   [name], MD  ALPRAZolam Duanne Moron) 0.25 MG tablet TAKE 1 TABLET BY MOUTH AT BEDTIME AS NEEDED FOR ANXIETY 08/30/14   Mosie Lukes, MD  ALPRAZolam Duanne Moron) 0.5 MG tablet Take 1 tablet (0.5 mg total) by mouth at bedtime as needed for anxiety. 09/20/14   Gay Filler Copland, MD  calcium carbonate (OS-CAL) 600 MG TABS Take 1,800 mg by mouth daily.    Historical Provider, MD  cetirizine (ZYRTEC) 10 MG tablet Take 10 mg by mouth daily.    Historical Provider, MD  cholecalciferol (VITAMIN D) 1000 UNITS tablet Take 1,000 Units by mouth daily.    Historical Provider, MD  cyclobenzaprine (FLEXERIL) 10 MG tablet TAKE 1 TABLET (10 MG TOTAL) BY MOUTH 2 (TWO) TIMES DAILY AS NEEDED FOR MUSCLE SPASMS. 09/20/14   Darreld Mclean, MD  DULoxetine (CYMBALTA) 30 MG capsule TAKE 3 CAPSULES BY MOUTH DAILY 07/01/14   Mosie Lukes, MD  esomeprazole (NEXIUM) 40 MG capsule Take 1 capsule (40 mg total) by mouth daily before breakfast. 11/14/12   Mosie Lukes, MD  fenofibrate 160 MG tablet TAKE 1 TABLET BY MOUTH EVERY DAY 08/17/14   Mosie Lukes, MD  fluticasone (FLONASE) 50 MCG/ACT nasal spray Place 1 spray into the nose 2 (two) times daily. 08/10/12   Historical Provider, MD  Fluticasone-Salmeterol (ADVAIR DISKUS) 250-50 MCG/DOSE AEPB Inhale 1 puff into the lungs 2 (two) times daily. 08/26/13   Debbrah Alar, NP  hydrochlorothiazide (HYDRODIURIL) 25 MG tablet TAKE 1 TABLET (25 MG TOTAL) BY MOUTH DAILY. 07/01/14   Mosie Lukes, MD  HYDROcodone-acetaminophen (NORCO/VICODIN) 5-325 MG per tablet Take 1 tablet by mouth every 8 (eight) hours  as needed. 09/20/14   Gay Filler Copland, MD  levothyroxine (SYNTHROID, LEVOTHROID) 25 MCG tablet TAKE 1 TABLET BY MOUTH EVERY DAY 08/05/14   Mosie Lukes, MD  metoprolol (LOPRESSOR) 50 MG tablet TAKE 1 TABLET (50 MG TOTAL) BY MOUTH 2 (TWO) TIMES DAILY. 08/19/14   Mosie Lukes, MD  Multiple Vitamins-Minerals (MULTIVITAMIN WITH MINERALS) tablet Take 1 tablet by mouth daily.    Historical Provider, MD  nicotine (NICODERM CQ - DOSED IN MG/24 HOURS) 21 mg/24hr patch Place 21 mg onto the skin daily.    Historical Provider, MD  olopatadine (PATANOL) 0.1 % ophthalmic solution Place 1 drop into both eyes 2 (two) times daily. 09/12/12   Debbrah Alar, NP   BP 149/71 mmHg  Pulse 109  Temp(Src) 98.4 F (36.9 C) (Oral)  Resp 19  Ht 5\' 5"  (1.651 m)  Wt 231 lb 5 oz (104.923 kg)  BMI 38.49 kg/m2  SpO2 95% Physical Exam  Constitutional: She is oriented to person, place, and time. She appears well-developed and well-nourished. No distress.  HENT:  Head: Normocephalic and atraumatic.  Mouth/Throat: Oropharynx is clear and moist.  Eyes: Conjunctivae and EOM are normal. Pupils are equal, round, and reactive to light.  Neck: Normal range of motion. Neck supple.  Cardiovascular: Regular rhythm and intact distal pulses.  Tachycardia present.   No murmur heard. Pulmonary/Chest: Effort normal and breath sounds normal. No respiratory distress. She has no wheezes. She has no rales. She exhibits no tenderness.   Abdominal: Soft. She exhibits no distension. There is no tenderness. There is no rebound and no guarding.  Musculoskeletal: Normal range of motion. She exhibits no edema or tenderness.  Neurological: She is alert and oriented to person, place, and time.  Skin: Skin is warm and dry. No rash noted. No erythema.  Psychiatric: She has a normal mood and affect. Her behavior is normal.  Nursing note and vitals reviewed.   ED Course  Procedures (including critical care time) Labs Review Labs Reviewed  CBC WITH DIFFERENTIAL/PLATELET - Abnormal; Notable for the following:    Neutrophils Relative % 41 (*)    Lymphocytes Relative 48 (*)    All other components within normal limits  D-DIMER, QUANTITATIVE - Abnormal; Notable for the following:    D-Dimer, Quant 0.79 (*)    All other components within normal limits  I-STAT CHEM 8, ED - Abnormal; Notable for the following:    Potassium 3.4 (*)    Chloride 100 (*)    All other components within normal limits  I-STAT TROPOININ, ED  Randolm Idol, ED    Imaging Review Dg Chest 2 View  09/23/2014   CLINICAL DATA:  Sharp left-sided chest pain.  EXAM: CHEST  2 VIEW  COMPARISON:  08/09/2013  FINDINGS: Grossly unchanged cardiac silhouette and mediastinal contours. No focal airspace opacities. No pleural effusion or pneumothorax. No evidence of edema. No acute osseus abnormalities.  IMPRESSION: No acute cardiopulmonary disease.   Electronically Signed   By: Sandi Mariscal M.D.   On: 09/23/2014 17:24   Ct Angio Chest Pe W/cm &/or Wo Cm  09/23/2014   CLINICAL DATA:  Acute onset of tachycardia and chest heaviness. Shortness of breath and weakness. Initial encounter.  EXAM: CT ANGIOGRAPHY CHEST WITH CONTRAST  TECHNIQUE: Multidetector CT imaging of the chest was performed using the standard protocol during bolus administration of intravenous contrast. Multiplanar CT image reconstructions and MIPs were obtained to evaluate the vascular anatomy.  CONTRAST:  120mL  OMNIPAQUE IOHEXOL 350 MG/ML  SOLN  COMPARISON:  CTA of the chest performed 09/03/2006, and chest radiograph performed earlier today at 5:01 p.m.  FINDINGS: There is no evidence of pulmonary embolus.  The lungs are essentially clear bilaterally. There is no evidence of significant focal consolidation, pleural effusion or pneumothorax. No masses are identified; no abnormal focal contrast enhancement is seen.  A 1.4 cm right hilar node is seen. Additional azygoesophageal recess and periaortic nodes measure up to 1.1 cm in short axis. No pericardial effusion is identified. The great vessels are grossly unremarkable in appearance. No axillary lymphadenopathy is seen. The right thyroid lobe is absent; the visualized portions of the left thyroid lobe are unremarkable.  There is diffuse fatty infiltration of the liver. The visualized portions of the spleen are unremarkable. The visualized portions of the adrenal glands, pancreas and kidneys are within normal limits.  No acute osseous abnormalities are seen.  Review of the MIP images confirms the above findings.  IMPRESSION: 1. No evidence of pulmonary embolus. 2. Lungs clear bilaterally. 3. Prominent right hilar and mediastinal nodes noted, slightly more prominent than in 2008. This is nonspecific, but may reflect an underlying chronic systemic or inflammatory process.   Electronically Signed   By: Garald Balding M.D.   On: 09/23/2014 22:17     EKG Interpretation   Date/Time:  Thursday Sep 23 2014 16:48:49 EDT Ventricular Rate:  126 PR Interval:  142 QRS Duration: 86 QT Interval:  328 QTC Calculation: 475 R Axis:   84 Text Interpretation:  Sinus tachycardia with Premature supraventricular  complexes Otherwise normal ECG No significant change since last tracing  Confirmed by Maryan Rued  MD, Loree Fee ([id number]) on 09/23/2014 5:57:11 PM      MDM   Final diagnoses:  Chest pain    Patient presents today with a history of chest pressure and discomfort that  started about 1:30 PM today with associated shortness of breath. No alleviating factors but may be worse with walking. Patient denies any new cough, fever, URI symptoms. She does have a history of COPD but has been using her inhalers regularly. She does not feel like this is a COPD exacerbation. Patient does not have a cardiologist or heart problems but did see cardiology approximate 2 or 3 years ago and had a normal stress test. She is not sure why she saw them about time. Patient denies any risk factors for blood clots at this time. She did have a clot once when she was on tamoxifen but has not had any problems since that medication was discontinued 12 years ago.  She denies any unilateral leg pain or swelling, recent travel or surgeries.  Heart score of 4.  Patient does have a history of anxiety but she is very call on exam states she's never had a panic attack this felt like this before and does not know why she would be panicked that she is very happy and things of been going well.  EKG shows sinus tachycardia with occasional PVCs.  Chest x-ray, chem 8, troponin, CBC are all within normal limits. Patient will be given aspirin and nitroglycerin to see if her pain improves.   7:26 PM Patient had no improvement after aspirin and nitroglycerin. Will give Ativan and d-dimer pending.  8:16 PM Some improvement with Ativan however patient still having some discomfort and was given morphine. D-dimer is elevated and CTA pending  10:45 PM CTA of the chest negative for acute pathology. On reevaluation patient is feeling better tachycardia has improved. However given  her heart score of 4 and history feel that she needs admission for rule out.  Blanchie Dessert, MD 09/23/14 4037  Blanchie Dessert, MD 09/23/14 [phone]

## 2014-09-23 NOTE — H&P (Signed)
[facility] Admission History and Physical Service Pager: [phone]  Patient name: Susan [name] Medical record number: [id number] Date of birth: 1951-02-13 Age: 64 y.o. Gender: female  Primary Care Provider: Lamar Blinks, MD Consultants: cardiology Code Status: FULL (discussed on admission)  Chief Complaint: Chest pain  Assessment and Plan: Susan [name] is a 64 y.o. female presenting with chest pain . PMH is significant for HTN, HLD, OSA, anxiety, tobacco dependence, ductal carcinoma, anemia, hypothyroidism, h/o DVT when on Tamoxifen  #Chest pain/pressure: Heart score 5. Active smoker. Tachy 120 in ED. Other VS stable.  Stress test in 2010 unremarkable.  K 3.4, iStatTrop neg x2, D-Dimer 0.79, CTA neg for PE. CXR neg, EKG Sinus tach w/ PVC but no ischemic changes. Ddx: ischemia (no evidence so far but several RF) vs anxiety (on Xanax at home and sx's relieved by Ativan in ED) vs reflux (h/o reflux on PPI, sx occurred after lunch, no epigastric TTP on exam). History of negative stress test in 2010; has seen Dr. Rayann Heman in the past. Echo at that time w/ EF ~65%, mild elevated filling pressures. -Place in observation under Dr Gwendlyn Deutscher -Telemetry -VS per floor protocol -Cycle troponins -Risk stratification labs (TSH, A1c, Lipid) -Repeat EKG in am -Continue home BP meds (HCTZ, Metoprolol) -c/s cards in am, consider stress test inpt vs outpatient  #HTN: BP stable. -Continue home medications as above  #HLD: last FLP with low HDL in 2015. -FLP pending  #Anxiety: Continue home Cymbalta, Xanax PRN   #OSA: Continue CPAP qHS  #Tobacco dependence: Actively trying to quit -Nicotine patch while in hospital  #Hypothyroidism: Synthroid 25 at home. TSH 1.01 in 01/2014 -TSH -Continue home med  #?COPD: has appt with pulm for official dx. -Continue Advair and Albuterol PRN  #DDD/neck pain: Continue home Vicodin PRN  FEN/GI: SLIV, HH/Carb Mod diet (will  make NPO until cards assessment), PPI Prophylaxis: Sub-q heparin  Disposition: Observation. Discharge home pending cards eval  History of Present Illness: Susan [name] is a 64 y.o. female presenting with CP Patient reports that symptoms began around 1:30pm this afternoon after having lunch with her family who was visiting from out of town.  She states that shortly after lunch, she began experiencing chest pressure/tightness.  She points to her mid chest.  She states that she felt like she could feel her pulse in her neck.  She reports that she used to be a Marine scientist and checked her pulse, which she noted to be racing.  She also noted that she was short of breath during this time.  She does note that she had 3 cups of coffee, which was atypical, earlier that morning.  She reports that she did not feel particularly anxious about anything; she was rather excited to be spending time with family.  Reports a h/o anxiety, depression, fibromyalgia.  She is on Cymbalta and Xanax for these.  Reports that she recently was taken off of Temazepam.  She also states that she took 1/2 of her Xanax 0.5mg  tablet when she began experiencing symptoms and that it did not help.  She denies diaphoresis, N/V, dizziness, speech difficulty, weakness.  Review Of Systems: Per HPI with the following additions: none Otherwise 12 point review of systems was performed and was unremarkable.  Patient Active Problem List   Diagnosis Date Noted  . Chest pain 09/23/2014  . Hypothyroidism 02/14/2014  . Tobacco abuse-unspec 10/12/2013  . Hip pain, right 10/12/2013  . Asthma with acute exacerbation 08/25/2013  .  Cervical cancer screening 03/09/2013  . Seasonal allergies 09/12/2012  . Nasal polyp 08/30/2012  . SOM (serous otitis media) 08/30/2012  . Dysuria 07/20/2012  . Peripheral neuropathy 07/19/2012  . Cervical myofascial pain syndrome 06/08/2012  . Anemia 01/28/2012  . HTN (hypertension) 01/28/2012  . Hyperlipidemia  01/28/2012  . Hyperglycemia 01/28/2012  . Preventative health care 01/28/2012  . Pain of left heel 01/28/2012  . Neck pain 12/30/2011  . Muscle spasm 12/30/2011  . Anxiety and depression   . Osteoarthritis   . Cancer   . GERD (gastroesophageal reflux disease)   . Fibromyalgia   . Sleep apnea   . Obesity   . Osteopenia   . History of blood clots   . Fatty liver disease, nonalcoholic   . DDD (degenerative disc disease)    Past Medical History: Past Medical History  Diagnosis Date  . Sleep apnea     on CPAP  . Osteopenia   . Fatty liver disease, nonalcoholic   . Obesity   . History of blood clots 2004    during cancer treatment  . Anxiety   . Osteoarthritis   . DDD (degenerative disc disease)   . DJD (degenerative joint disease)   . Cancer 2004    ductal carcinoma; lumpectomy  . Depression   . GERD (gastroesophageal reflux disease)   . Fibromyalgia   . Peripheral neuropathy   . Muscle spasm 12/30/2011  . Anemia 01/28/2012  . HTN (hypertension) 01/28/2012  . Hyperlipidemia 01/28/2012  . Hyperglycemia 01/28/2012  . Preventative health care 01/28/2012  . Pain of left heel 01/28/2012  . Nasal polyp 08/30/2012  . Tobacco abuse-unspec 10/12/2013  . Hip pain, right 10/12/2013  . Hypothyroidism 02/14/2014   Past Surgical History: Past Surgical History  Procedure Laterality Date  . Breast surgery  2004    lumpectomy  . Joint replacement      right total knee  . Bilateral oophorectomy  01/2003  . Nasal septum surgery    . Carpal tunnel release      right  . Abdominal hysterectomy  1991  . Thyroidectomy, partial  mid 80's    right side   Social History: History  Substance Use Topics  . Smoking status: Current Every Day Smoker -- 0.50 packs/day    Types: Cigarettes  . Smokeless tobacco: Never Used     Comment: Pt currently using nicotine patch.  tf,cma  . Alcohol Use: No   Additional social history: active smoker, no ETOH, no drug use  Please also refer to relevant  sections of EMR.  Family History: Family History  Problem Relation Age of Onset  . Cancer Mother     liver cancer, hep c  . Heart disease Mother     chf  . Hypertension Mother   . Hyperlipidemia Mother   . Osteoporosis Mother   . Cancer Father     lung  . COPD Father   . Cancer Sister     breas at [place], non Hodgkin's Lymphoma  . Heart disease Sister     mvp  . Hypertension Brother   . Benign prostatic hyperplasia Brother   . Arthritis Brother   . Kidney disease Brother   . Other Daughter     Beal's Syndrome  . Arthritis Daughter     Beal's  . Arthritis Son     Beal's connective tissue disease  . Vision loss Maternal Grandmother   . Dementia Maternal Grandmother   . Vision loss Paternal Grandfather   .  Hypertension Brother   . Benign prostatic hyperplasia Brother   . Cancer Brother     prostate  . Hypertension Brother   . COPD Brother   . Cancer Brother     prostate  . Hypertension Brother   . Hyperlipidemia Brother   . Cancer Brother     prostate  . Mental illness Brother     schizophrenia  . Cirrhosis Brother     hep c  . Hypertension Sister   . Other Sister     thalessemia, anemia  . Hyperlipidemia Sister   . Gout Sister   . Other Daughter     overweight   Allergies and Medications: Allergies  Allergen Reactions  . Chantix [Varenicline]     "Felt like having mental breakdown"  . Penicillins Swelling  . Ciprofloxacin Other (See Comments)    Muscle tightness, tendon aching and pain  . Lyrica [Pregabalin] Swelling  . Neurontin [Gabapentin] Swelling  . Tamoxifen     Blood clots  . Ceclor [Cefaclor] Rash  . Sulfa Antibiotics Rash   No current facility-administered medications on file prior to encounter.   Current Outpatient Prescriptions on File Prior to Encounter  Medication Sig Dispense Refill  . albuterol (PROVENTIL HFA;VENTOLIN HFA) 108 (90 BASE) MCG/ACT inhaler Inhale 2 puffs into the lungs every 4 (four) hours as needed for wheezing or  shortness of breath (cough, shortness of breath or wheezing.). 1 Inhaler 1  . ALPRAZolam (XANAX) 0.5 MG tablet Take 1 tablet (0.5 mg total) by mouth at bedtime as needed for anxiety. 30 tablet 0  . calcium carbonate (OS-CAL) 600 MG TABS Take 1,800 mg by mouth daily.    . cholecalciferol (VITAMIN D) 1000 UNITS tablet Take 1,000 Units by mouth daily.    . cyclobenzaprine (FLEXERIL) 10 MG tablet TAKE 1 TABLET (10 MG TOTAL) BY MOUTH 2 (TWO) TIMES DAILY AS NEEDED FOR MUSCLE SPASMS. 30 tablet 0  . DULoxetine (CYMBALTA) 30 MG capsule TAKE 3 CAPSULES BY MOUTH DAILY 90 capsule 5  . esomeprazole (NEXIUM) 40 MG capsule Take 1 capsule (40 mg total) by mouth daily before breakfast. (Patient taking differently: Take 20 mg by mouth daily before breakfast. ) 30 capsule 5  . fenofibrate 160 MG tablet TAKE 1 TABLET BY MOUTH EVERY DAY 30 tablet 2  . fluticasone (FLONASE) 50 MCG/ACT nasal spray Place 1 spray into the nose daily as needed for allergies.     . Fluticasone-Salmeterol (ADVAIR DISKUS) 250-50 MCG/DOSE AEPB Inhale 1 puff into the lungs 2 (two) times daily. 60 each 2  . hydrochlorothiazide (HYDRODIURIL) 25 MG tablet TAKE 1 TABLET (25 MG TOTAL) BY MOUTH DAILY. 30 tablet 5  . HYDROcodone-acetaminophen (NORCO/VICODIN) 5-325 MG per tablet Take 1 tablet by mouth every 8 (eight) hours as needed. 30 tablet 0  . levothyroxine (SYNTHROID, LEVOTHROID) 25 MCG tablet TAKE 1 TABLET BY MOUTH EVERY DAY 30 tablet 2  . metoprolol (LOPRESSOR) 50 MG tablet TAKE 1 TABLET (50 MG TOTAL) BY MOUTH 2 (TWO) TIMES DAILY. 60 tablet 4  . Multiple Vitamins-Minerals (MULTIVITAMIN WITH MINERALS) tablet Take 1 tablet by mouth daily.    . nicotine (NICODERM CQ - DOSED IN MG/24 HOURS) 21 mg/24hr patch Place 21 mg onto the skin daily.    Marland Kitchen ALPRAZolam (XANAX) 0.25 MG tablet TAKE 1 TABLET BY MOUTH AT BEDTIME AS NEEDED FOR ANXIETY (Patient not taking: Reported on 09/23/2014) 30 tablet 0  . olopatadine (PATANOL) 0.1 % ophthalmic solution Place 1 drop  into both eyes 2 (two)  times daily. (Patient not taking: Reported on 09/23/2014) 5 mL 2    Objective: BP 131/70 mmHg  Pulse 92  Temp(Src) 98.4 F (36.9 C) (Oral)  Resp 25  Ht 5\' 5"  (1.651 m)  Wt 231 lb 5 oz (104.923 kg)  BMI 38.49 kg/m2  SpO2 98% Exam: General: awake, alert, well appearing female, NAD, daughter, sister and husband at bedside Eyes: EOMI ENTM: dentition fair, MMM Neck:  no appreciable goiters Cardiovascular: RRR, no murmurs, JVP difficult to appreciate, but normal.  Respiratory: Good air movement, CTAB, no increased WOB Abdomen: obese, soft, NT/ND, +BS MSK: moves extremities independently, +1 pitting edema in LE symmetric, no palpable cords, negative Homan's.  Skin: callus on L middle toe with some skin break down noted Neuro: somewhat hard of hearing, otherwise no focal deficits, follows commands Psych: speech normal, affect appropriate, mood stable  Labs and Imaging: CBC BMET   Recent Labs Lab 09/23/14 1656 09/23/14 1717  WBC 6.5  --   HGB 14.0 14.3  HCT 40.1 42.0  PLT 278  --     Recent Labs Lab 09/23/14 1717  NA 142  K 3.4*  CL 100*  BUN 18  CREATININE 0.90  GLUCOSE 96    Dg Chest 2 View  09/23/2014   CLINICAL DATA:  Sharp left-sided chest pain.  EXAM: CHEST  2 VIEW  COMPARISON:  08/09/2013  FINDINGS: Grossly unchanged cardiac silhouette and mediastinal contours. No focal airspace opacities. No pleural effusion or pneumothorax. No evidence of edema. No acute osseus abnormalities.  IMPRESSION: No acute cardiopulmonary disease.   Electronically Signed   By: Sandi Mariscal M.D.   On: 09/23/2014 17:24   Ct Angio Chest Pe W/cm &/or Wo Cm  09/23/2014   CLINICAL DATA:  Acute onset of tachycardia and chest heaviness. Shortness of breath and weakness. Initial encounter.  EXAM: CT ANGIOGRAPHY CHEST WITH CONTRAST  TECHNIQUE: Multidetector CT imaging of the chest was performed using the standard protocol during bolus administration of intravenous contrast.  Multiplanar CT image reconstructions and MIPs were obtained to evaluate the vascular anatomy.  CONTRAST:  161mL OMNIPAQUE IOHEXOL 350 MG/ML SOLN  COMPARISON:  CTA of the chest performed 09/03/2006, and chest radiograph performed earlier today at 5:01 p.m.  FINDINGS: There is no evidence of pulmonary embolus.  The lungs are essentially clear bilaterally. There is no evidence of significant focal consolidation, pleural effusion or pneumothorax. No masses are identified; no abnormal focal contrast enhancement is seen.  A 1.4 cm right hilar node is seen. Additional azygoesophageal recess and periaortic nodes measure up to 1.1 cm in short axis. No pericardial effusion is identified. The great vessels are grossly unremarkable in appearance. No axillary lymphadenopathy is seen. The right thyroid lobe is absent; the visualized portions of the left thyroid lobe are unremarkable.  There is diffuse fatty infiltration of the liver. The visualized portions of the spleen are unremarkable. The visualized portions of the adrenal glands, pancreas and kidneys are within normal limits.  No acute osseous abnormalities are seen.  Review of the MIP images confirms the above findings.  IMPRESSION: 1. No evidence of pulmonary embolus. 2. Lungs clear bilaterally. 3. Prominent right hilar and mediastinal nodes noted, slightly more prominent than in 2008. This is nonspecific, but may reflect an underlying chronic systemic or inflammatory process.   Electronically Signed   By: Garald Balding M.D.   On: 09/23/2014 22:17   Janora Norlander, DO 09/23/2014, 11:42 PM PGY-1, [place] Intern pager: [phone],  text pages welcome   I have seen and evaluated the patient with Dr. Lajuana Ripple. I am in agreement with the note above in its revised form. My additions are in red.  Ilona Colley B. Bonner Puna, MD, PGY-2 09/24/2014 1:28 AM

## 2014-09-23 NOTE — ED Notes (Signed)
Pt st's she started feeling her heart racing approx 1:30 pm today.  St's her chest felt heavy.  Also st's she was short of breath and felt weak

## 2014-09-23 NOTE — ED Notes (Signed)
Spoke to [employer] @[phone]  need more time in with patient will call back.

## 2014-09-24 ENCOUNTER — Encounter (HOSPITAL_COMMUNITY): Payer: Self-pay | Admitting: *Deleted

## 2014-09-24 DIAGNOSIS — M797 Fibromyalgia: Secondary | ICD-10-CM

## 2014-09-24 DIAGNOSIS — R002 Palpitations: Secondary | ICD-10-CM | POA: Diagnosis not present

## 2014-09-24 DIAGNOSIS — R079 Chest pain, unspecified: Secondary | ICD-10-CM | POA: Diagnosis not present

## 2014-09-24 DIAGNOSIS — F418 Other specified anxiety disorders: Secondary | ICD-10-CM | POA: Diagnosis not present

## 2014-09-24 DIAGNOSIS — I10 Essential (primary) hypertension: Secondary | ICD-10-CM

## 2014-09-24 DIAGNOSIS — R0602 Shortness of breath: Secondary | ICD-10-CM | POA: Diagnosis not present

## 2014-09-24 DIAGNOSIS — R0789 Other chest pain: Secondary | ICD-10-CM

## 2014-09-24 DIAGNOSIS — E039 Hypothyroidism, unspecified: Secondary | ICD-10-CM

## 2014-09-24 DIAGNOSIS — R000 Tachycardia, unspecified: Secondary | ICD-10-CM | POA: Diagnosis not present

## 2014-09-24 DIAGNOSIS — G4730 Sleep apnea, unspecified: Secondary | ICD-10-CM

## 2014-09-24 LAB — BASIC METABOLIC PANEL
ANION GAP: 9 (ref 5–15)
BUN: 14 mg/dL (ref 6–20)
CHLORIDE: 100 mmol/L — AB (ref 101–111)
CO2: 30 mmol/L (ref 22–32)
CREATININE: 0.8 mg/dL (ref 0.44–1.00)
Calcium: 9.2 mg/dL (ref 8.9–10.3)
GFR calc non Af Amer: >60 mL/min (ref >60)
Glucose, Bld: 141 mg/dL — ABNORMAL HIGH (ref 65–99)
POTASSIUM: 3.8 mmol/L (ref 3.5–5.1)
SODIUM: 139 mmol/L (ref 135–145)

## 2014-09-24 LAB — CBC
HCT: 36.7 % (ref 36.0–46.0)
HEMOGLOBIN: 12.7 g/dL (ref 12.0–15.0)
MCH: 29.5 pg (ref 26.0–34.0)
MCHC: 34.6 g/dL (ref 30.0–36.0)
MCV: 85.2 fL (ref 78.0–100.0)
Platelets: 243 10*3/uL (ref 150–400)
RBC: 4.31 MIL/uL (ref 3.87–5.11)
RDW: 12.5 % (ref 11.5–15.5)
WBC: 7.1 10*3/uL (ref 4.0–10.5)

## 2014-09-24 LAB — LIPID PANEL
Cholesterol: 133 mg/dL (ref 0–200)
HDL: 30 mg/dL — AB (ref >40)
LDL CALC: 75 mg/dL (ref 0–99)
Total CHOL/HDL Ratio: 4.4 RATIO
Triglycerides: 141 mg/dL (ref <150)
VLDL: 28 mg/dL (ref 0–40)

## 2014-09-24 LAB — CREATININE, SERUM
CREATININE: 0.82 mg/dL (ref 0.44–1.00)
GFR calc Af Amer: >60 mL/min (ref >60)
GFR calc non Af Amer: >60 mL/min (ref >60)

## 2014-09-24 LAB — TSH: TSH: 2.198 u[IU]/mL (ref 0.350–4.500)

## 2014-09-24 LAB — TROPONIN I

## 2014-09-24 MED ORDER — ATORVASTATIN CALCIUM 20 MG PO TABS
20.0000 mg | ORAL_TABLET | Freq: Every day | ORAL | Status: DC
  Administered 2014-09-24: 20 mg via ORAL
  Filled 2014-09-24: qty 1

## 2014-09-24 MED ORDER — MOMETASONE FURO-FORMOTEROL FUM 100-5 MCG/ACT IN AERO
2.0000 | INHALATION_SPRAY | Freq: Two times a day (BID) | RESPIRATORY_TRACT | Status: DC
Start: 2014-09-24 — End: 2014-09-24
  Administered 2014-09-24: 2 via RESPIRATORY_TRACT
  Filled 2014-09-24 (×2): qty 8.8

## 2014-09-24 MED ORDER — ATORVASTATIN CALCIUM 20 MG PO TABS: 20.0000 mg | ORAL_TABLET | Freq: Every day | ORAL | Status: DC

## 2014-09-24 MED ORDER — ONDANSETRON HCL 4 MG/2ML IJ SOLN: 4.0000 mg | Freq: Four times a day (QID) | INTRAMUSCULAR | Status: DC | PRN

## 2014-09-24 MED ORDER — ASPIRIN EC 325 MG PO TBEC
325.0000 mg | DELAYED_RELEASE_TABLET | Freq: Every day | ORAL | Status: DC
  Administered 2014-09-24: 325 mg via ORAL
  Filled 2014-09-24: qty 1

## 2014-09-24 MED ORDER — ALPRAZOLAM 0.5 MG PO TABS: 0.5000 mg | ORAL_TABLET | Freq: Every evening | ORAL | Status: DC | PRN

## 2014-09-24 MED ORDER — ALBUTEROL SULFATE (2.5 MG/3ML) 0.083% IN NEBU: 3.0000 mL | INHALATION_SOLUTION | RESPIRATORY_TRACT | Status: DC | PRN

## 2014-09-24 MED ORDER — LEVOTHYROXINE SODIUM 25 MCG PO TABS
25.0000 ug | ORAL_TABLET | Freq: Every day | ORAL | Status: DC
Start: 2014-09-24 — End: 2014-09-24
  Administered 2014-09-24: 25 ug via ORAL
  Filled 2014-09-24: qty 1

## 2014-09-24 MED ORDER — FENOFIBRATE 160 MG PO TABS: 160.0000 mg | ORAL_TABLET | Freq: Every day | ORAL | Status: DC

## 2014-09-24 MED ORDER — HYDROCHLOROTHIAZIDE 25 MG PO TABS
25.0000 mg | ORAL_TABLET | Freq: Every day | ORAL | Status: DC
  Administered 2014-09-24: 25 mg via ORAL
  Filled 2014-09-24: qty 1

## 2014-09-24 MED ORDER — METOPROLOL TARTRATE 50 MG PO TABS
50.0000 mg | ORAL_TABLET | Freq: Two times a day (BID) | ORAL | Status: DC
  Administered 2014-09-24: 50 mg via ORAL
  Filled 2014-09-24: qty 1

## 2014-09-24 MED ORDER — HEPARIN SODIUM (PORCINE) 5000 UNIT/ML IJ SOLN
5000.0000 [IU] | Freq: Three times a day (TID) | INTRAMUSCULAR | Status: DC
Start: 2014-09-24 — End: 2014-09-24
  Administered 2014-09-24: 5000 [IU] via SUBCUTANEOUS
  Filled 2014-09-24: qty 1

## 2014-09-24 MED ORDER — PANTOPRAZOLE SODIUM 40 MG PO TBEC
40.0000 mg | DELAYED_RELEASE_TABLET | Freq: Every day | ORAL | Status: DC
  Administered 2014-09-24: 40 mg via ORAL
  Filled 2014-09-24: qty 1

## 2014-09-24 MED ORDER — DULOXETINE HCL 60 MG PO CPEP
90.0000 mg | ORAL_CAPSULE | Freq: Every day | ORAL | Status: DC
  Administered 2014-09-24: 90 mg via ORAL
  Filled 2014-09-24 (×2): qty 1

## 2014-09-24 MED ORDER — MORPHINE SULFATE 2 MG/ML IJ SOLN: 2.0000 mg | INTRAMUSCULAR | Status: DC | PRN

## 2014-09-24 MED ORDER — GI COCKTAIL ~~LOC~~
30.0000 mL | Freq: Four times a day (QID) | ORAL | Status: DC | PRN
  Administered 2014-09-24: 30 mL via ORAL
  Filled 2014-09-24: qty 30

## 2014-09-24 MED ORDER — METOPROLOL TARTRATE 50 MG PO TABS: 50.0000 mg | ORAL_TABLET | Freq: Two times a day (BID) | ORAL | Status: DC

## 2014-09-24 MED ORDER — HYDROCODONE-ACETAMINOPHEN 5-325 MG PO TABS: 1.0000 | ORAL_TABLET | Freq: Three times a day (TID) | ORAL | Status: DC | PRN

## 2014-09-24 MED ORDER — NICOTINE 21 MG/24HR TD PT24: 21.0000 mg | MEDICATED_PATCH | Freq: Every day | TRANSDERMAL | Status: DC

## 2014-09-24 MED ORDER — ACETAMINOPHEN 325 MG PO TABS
650.0000 mg | ORAL_TABLET | ORAL | Status: DC | PRN
  Administered 2014-09-24: 650 mg via ORAL
  Filled 2014-09-24: qty 2

## 2014-09-24 MED ORDER — ASPIRIN EC 81 MG PO TBEC: 81.0000 mg | DELAYED_RELEASE_TABLET | Freq: Every day | ORAL | Status: DC

## 2014-09-24 NOTE — Progress Notes (Signed)
Chaplain responded to page that pt requested advanced directive information.  Chaplain educated pt and spouse about advanced directives and delivered paperwork.  Pt wishes to review the paperwork with spouse and will page chaplain when/if completed and ready for notary.      09/24/14 1000  Clinical Encounter Type  Visited With Patient and family together  Visit Type Initial;Social support  Referral From Patient;Nurse  Spiritual Encounters  Spiritual Needs Literature  Stress Factors  Patient Stress Factors None identified  Family Stress Factors None identified  Advance Directives (For Healthcare)  Does patient have an advance directive? No  Would patient like information on creating an advanced directive? Yes - Educational materials given   Geralyn Flash 09/24/2014 10:18 AM

## 2014-09-24 NOTE — Progress Notes (Signed)
Family Medicine Teaching Service Daily Progress Note Intern Pager: [phone]  Patient name: Susan [name] Medical record number: [id number] Date of birth: December 15, 1950 Age: 64 y.o. Gender: female  Primary Care Provider: Lamar Blinks, MD Consultants: Cardiology Code Status: Full  Pt Overview and Major Events to Date:  5/20: Admitted with CP / Anxiety rule out ACS.   Assessment and Plan: Susan [name] is a 64 y.o. female presenting with chest pain . PMH is significant for HTN, HLD, OSA, anxiety, tobacco dependence, ductal carcinoma in-situ s/p lumpectomy and radiation therapy, anemia, hypothyroidism, h/o DVT when on Tamoxifen.    #Chest pain/pressure: Heart score 5. Typical and atypical symptoms. Active smoker. Tachy 120 in ED. Stress test in 2010 unremarkable. K 3.4, iStatTrop neg x2, D-Dimer 0.79, CTA neg for PE. CXR neg, EKG Sinus tach w/ PVC but no ischemic changes. Has seen Dr. Rayann Heman in the past. Echo at that time w/ EF ~65%, mild elevated filling pressures. Chest pain and symptoms resolved overnight. Asymptomatic this am. EKG this am without acute changes and sinus rhythm. Laboratory workup unrevealing. ASCVD risk 13.9%.  - Cardiac observation.  - Telemetry for now.  - Troponins negative.  - TSH normal, A1C pending, Lipids WNL except HDL 30.  -Continue home BP meds (HCTZ, Metoprolol). Holding metoprolol for ETT.  - ASA, on Fenofibrate but likely needs medium to high dose statin therapy pending further risk stratification. Lipitor 20 for now.  - Inpatient Exercises tolerance test.  - Cardiology on board and we appreciate their recs.   #HTN: BP stable 137/71 this am.  -Continue HCTZ, holding metoprolol for now.   #HLD: On Fenofibrate. Mildly elevated triglycerides in the past to 193. Otherwise LDL and total cholesterol normal. HDL low and likely reflects lifestyle. Would favor discontinuing fenofibrate and placing her on a medium to high dose statin predominantly for ASCVD  risk and event reduction. There is a slightly increased risk of pancreatitis with fenofibrate treatment of normal to mildly elevated triglycerides.  - Placed on Lipitor 20 mg here for now.  - D/C fenofibrate.   #Anxiety: Continue home Cymbalta, Xanax PRN   #OSA: Continue CPAP qHS  # Asthma / COPD: Pt. With diagnosis of asthma, though in further discussion her symptoms have occurred over the past two years and is actually favored to be incompletely diagnosed COPD. Asymptomatic here. End expiratory wheeze to exam.   - Home Advair switched for [facility] here.  - Albuterol prn.    # NAFLD: Pt. With known NAFLD. Slightly elevated ALT to 38 on admission, otherwise liver function appears preserved.  - Lifestyle modification.  - Starting low to medium dose statin here. Will need repeat transaminases in 1-2 months after discharge.   #Tobacco dependence: Actively trying to quit -Nicotine patch while in hospital -Smoking cessation counseling given.   # Breast Cancer : Pt. With DCIS s/p lumpectomy and radiation. Lumpectomy in 2004. She has had yearly follow up and screening since with negative mammograms. No abnormalities at this time.   # Right Hilar LAD and Paraaortic Nodes: This was noted on CTA to rule out PE at this admission. She had a CT scan of the chest in 2008 that noted similar but smaller findings. Right hilar node from 97mm to 1.4cm. History of cancer as above. These findings are nonspecific, but should be followed up on at discharge by her PCP / Oncology team for further workup / monitoring. - Will recommend follow up with PCP and referral for further evaluation by heme /  onc as indicated.  - Hematologic evaluation here unremarkable. Chemistry is unremarkable as well.   #Hypothyroidism: Synthroid 25 at home. TSH 1.01 in 01/2014. Has not been overly high in the past. TSH is 2.198 here. True hypothyroidism vs. Augmentation with low dose synthroid for mood / energy. Unclear. Either way will  continue low dose synthroid here. History of lower pole thyroid nodule on CT scan.  -TSH -Continue home med  #DDD/neck pain: Continue home Vicodin PRN  FEN/GI: SLIV, HH/Carb Mod diet (will make NPO until cards assessment), PPI Prophylaxis: Sub-q heparin  Disposition: Pending further cardiac workup.   Subjective:  No acute events overnight. Her chest pain is resolved. She has no further SOB. She says that she feels much better overall. No nausea / vomiting or diaphoresis.  Objective: Temp:  [98.1 F (36.7 C)-98.4 F (36.9 C)] 98.3 F (36.8 C) (05/20 0504) Pulse Rate:  [64-119] 64 (05/20 0504) Resp:  [13-26] 18 (05/20 0504) BP: (92-163)/(45-81) 137/71 mmHg (05/20 0940) SpO2:  [93 %-100 %] 98 % (05/20 0940) Weight:  [231 lb 5 oz (104.923 kg)-232 lb 2.3 oz (105.3 kg)] 232 lb 2.3 oz (105.3 kg) (05/20 0013) Physical Exam: General: NAD, AAOx3 Cardiovascular: RRR, No MGR, normal S1/S2, No heaves, no rubs.  Respiratory: CTA Bilaterally, appropriate rate, unlabored.  Abdomen: S, NT, ND, +BS, mildly enlarged liver.  Extremities: WWP, 2+ distal pulses, MAEW, no edema.  Neuro: AAOx3, no focal deficits.  Psych: Affect appropriate, Mood appropriate.   Laboratory:  Recent Labs Lab 09/23/14 1656 09/23/14 1717 09/24/14 0215  WBC 6.5  --  7.1  HGB 14.0 14.3 12.7  HCT 40.1 42.0 36.7  PLT 278  --  243    Recent Labs Lab 09/23/14 1717 09/24/14 0215 09/24/14 0639  NA 142  --  139  K 3.4*  --  3.8  CL 100*  --  100*  CO2  --   --  30  BUN 18  --  14  CREATININE 0.90 0.82 0.80  CALCIUM  --   --  9.2  GLUCOSE 96  --  141*   Troponin negative x 3  Lipids  Cholesterol - 133 Triglycerides - 144 HDL - 30 LDL - 75 VLDL - 28  A1C - pending.   TSH - 2.198  EKG - NSR no acute changes.   Imaging/Diagnostic Tests: CTA Chest 5/20:  IMPRESSION: 1. No evidence of pulmonary embolus. 2. Lungs clear bilaterally. 3. Prominent right hilar and mediastinal nodes noted, slightly  more prominent than in 2008. This is nonspecific, but may reflect an underlying chronic systemic or inflammatory process.  CXR 5/19:  FINDINGS: Grossly unchanged cardiac silhouette and mediastinal contours. No focal airspace opacities. No pleural effusion or pneumothorax. No evidence of edema. No acute osseus abnormalities.  IMPRESSION: No acute cardiopulmonary disease.  Susan Hacker, MD 09/24/2014, 9:55 AM PGY-1, [place] Intern pager: [phone], text pages welcome

## 2014-09-24 NOTE — Progress Notes (Signed)
Pt exercised 5:46 of [place]. Resting EKG normal. B/P 144/86 at rest, peak B/P 234/80. Test stopped secondary to fatigue, no chest pain. Max HR 136 = . 85% of APMHR (133). Some up sloping ST depression noted in V5 but no other ischemic changes or arrhythmia.   Kerin Ransom PA-C 09/24/2014 2:46 PM

## 2014-09-24 NOTE — Clinical Social Work Note (Signed)
CSW Consult Acknowledged:   CSW received a consult for advance directive. CSW informed the family would prefer a chaplain. Spiritual care consult for advance directive. CSW will sign off.        [place], MSW, [place]

## 2014-09-24 NOTE — Discharge Summary (Signed)
[facility] Discharge Summary  Patient name: Susan [name] Medical record number: [id number] Date of birth: [birth year] Age: 64 y.o. Gender: female Date of Admission: 09/23/2014  Date of Discharge: 09/24/2014 Admitting Physician: Kinnie Feil, MD  Primary Care Provider: Lamar Blinks, MD Consultants: Cardiology   Indication for Hospitalization: Chest Pain   Discharge Diagnoses/Problem List:  HTN HLD OSA Tobacco Dependence Anxiety Ductal Carcinoma In-Situ s/p Lumpectomy and Radiation therapy.  Anemia Hypothyroidism Hx of DVT with Tamoxifen Lymphadenopathy  Disposition: Home  Discharge Condition: Stable.   Discharge Exam:  General: NAD, AAOx3 Cardiovascular: RRR, No MGR, normal S1/S2, No heaves, no rubs.  Respiratory: CTA Bilaterally, appropriate rate, unlabored.  Abdomen: S, NT, ND, +BS, mildly enlarged liver.  Extremities: WWP, 2+ distal pulses, MAEW, no edema.  Neuro: AAOx3, no focal deficits.  Psych: Affect appropriate, Mood appropriate.   Brief Hospital Course:  Susan [name] is a 64 y.o. female presenting with chest pain . PMH is significant for HTN, HLD, OSA, anxiety, tobacco dependence, ductal carcinoma in-situ s/p lumpectomy and radiation therapy, anemia, hypothyroidism, h/o DVT when on Tamoxifen.   #Chest pain: Susan [name] was admitted for chest pain to rule out Acute Coronary Syndrome. Her Heart score was 5. She was experiencing typical and atypical symptoms.  She was initially tachycardic in the ED. She had had a stress test in 2010 that was unremarkable. Her workup in the ED was unremarkable. Troponins were cycled and were negative. Her symptoms resolved overnight with rest. She did have a stress test on hospital day 2 with some up-sloping of ST depression in V5 but no other ischemic changes or arrhythmia. She was found to be safe for discharge with follow up with her PCP and Cardiology. She was started on daily ASA as  well as Statin. Her ASCVD risk is 13.9%.  She was continued on Metoprolol and HCTZ for blood pressure control.   # Hyperlipidemia: Pt. Previously on fenoffibrate for mildly elevated triglycerides in the past. Switched to statin for risk reduction. She has known NAFLD with minimal elevation of ALT to 38. She would benefit from repeat transaminases a few weeks after discharge to ensure that she is tolerating the statin.   # Right Hilar LAD and Paraaortic Nodes: This was noted on CTA to rule out PE at this admission. She had a CT scan of the chest in 2008 that noted similar but smaller findings. Right hilar node from 109mm to 1.4cm at this hospitalization. History of breast cancer. These findings are nonspecific, but should be followed up on at discharge by her PCP / Oncology team for further workup / monitoring.   # After Discharge - A1C noted to be 6.6. She was called by Dr. Gwendlyn Deutscher with this result. Was pre-diabetes previously. Lifestyle modifications / medication management should be discussed with her PCP.   Other Chronic Problems Managed This Admission:  HTN - Continued on HCTZ / Metoprolol.  Anxiety -  Continued on Cymbalta / Xanax prn. OSA - CPAP at night while here.  NAFLD - recommended continued lifestyle modification.  Tobacco Dependence - Trying to quit. She was on the nicotine patch while hospitalized.  Hypothyroidism  - Continued on 31mcg of synthroid.  Neck Pain / DDD - Continued on Vicodin prn.       Issues for Follow Up:  1. Needs cardiology follow up as an outpatient.  2. Follow up tolerance of Statin. Started ASA daily.  3. Needs repeat Transaminases in 3-4 weeks to ensure liver  tolerance of statin.  4. Needs Follow up for Central Lymphadenopathy noted on CT of the chest.  5. Discussion of Diabetes diagnosis and management plan.   Significant Procedures: Exercise Stress Test.   Significant Labs and Imaging:   Recent Labs Lab 09/23/14 1656 09/23/14 1717 09/24/14 0215   WBC 6.5  --  7.1  HGB 14.0 14.3 12.7  HCT 40.1 42.0 36.7  PLT 278  --  243    Recent Labs Lab 09/23/14 1717 09/24/14 0215 09/24/14 0639  NA 142  --  139  K 3.4*  --  3.8  CL 100*  --  100*  CO2  --   --  30  GLUCOSE 96  --  141*  BUN 18  --  14  CREATININE 0.90 0.82 0.80  CALCIUM  --   --  9.2   Troponin negative x 3  Lipids  Cholesterol - 133 Triglycerides - 144 HDL - 30 LDL - 75 VLDL - 28  A1C - 6.6  TSH - 2.198  EKG - NSR no acute changes.    Imaging/Diagnostic Tests: CTA Chest 5/20:  IMPRESSION: 1. No evidence of pulmonary embolus. 2. Lungs clear bilaterally. 3. Prominent right hilar and mediastinal nodes noted, slightly more prominent than in 2008. This is nonspecific, but may reflect an underlying chronic systemic or inflammatory process.  CXR 5/19:  FINDINGS: Grossly unchanged cardiac silhouette and mediastinal contours. No focal airspace opacities. No pleural effusion or pneumothorax. No evidence of edema. No acute osseus abnormalities.  IMPRESSION: No acute cardiopulmonary disease.  Results/Tests Pending at Time of Discharge: None  Discharge Medications:    Medication List    STOP taking these medications        fenofibrate 160 MG tablet      TAKE these medications        albuterol 108 (90 BASE) MCG/ACT inhaler  Commonly known as:  PROVENTIL HFA;VENTOLIN HFA  Inhale 2 puffs into the lungs every 4 (four) hours as needed for wheezing or shortness of breath (cough, shortness of breath or wheezing.).     ALPRAZolam 0.25 MG tablet  Commonly known as:  XANAX  TAKE 1 TABLET BY MOUTH AT BEDTIME AS NEEDED FOR ANXIETY     ALPRAZolam 0.5 MG tablet  Commonly known as:  XANAX  Take 1 tablet (0.5 mg total) by mouth at bedtime as needed for anxiety.     aspirin EC 81 MG tablet  Take 1 tablet (81 mg total) by mouth daily.     atorvastatin 20 MG tablet  Commonly known as:  LIPITOR  Take 1 tablet (20 mg total) by mouth daily at 6 PM.      calcium carbonate 600 MG Tabs tablet  Commonly known as:  OS-CAL  Take 1,800 mg by mouth daily.     cholecalciferol 1000 UNITS tablet  Commonly known as:  VITAMIN D  Take 1,000 Units by mouth daily.     cyclobenzaprine 10 MG tablet  Commonly known as:  FLEXERIL  TAKE 1 TABLET (10 MG TOTAL) BY MOUTH 2 (TWO) TIMES DAILY AS NEEDED FOR MUSCLE SPASMS.     DULoxetine 30 MG capsule  Commonly known as:  CYMBALTA  TAKE 3 CAPSULES BY MOUTH DAILY     esomeprazole 40 MG capsule  Commonly known as:  NEXIUM  Take 1 capsule (40 mg total) by mouth daily before breakfast.     fluticasone 50 MCG/ACT nasal spray  Commonly known as:  FLONASE  Place 1 spray into  the nose daily as needed for allergies.     Fluticasone-Salmeterol 250-50 MCG/DOSE Aepb  Commonly known as:  ADVAIR DISKUS  Inhale 1 puff into the lungs 2 (two) times daily.     hydrochlorothiazide 25 MG tablet  Commonly known as:  HYDRODIURIL  TAKE 1 TABLET (25 MG TOTAL) BY MOUTH DAILY.     HYDROcodone-acetaminophen 5-325 MG per tablet  Commonly known as:  NORCO/VICODIN  Take 1 tablet by mouth every 8 (eight) hours as needed.     levothyroxine 25 MCG tablet  Commonly known as:  SYNTHROID, LEVOTHROID  TAKE 1 TABLET BY MOUTH EVERY DAY     metoprolol 50 MG tablet  Commonly known as:  LOPRESSOR  TAKE 1 TABLET (50 MG TOTAL) BY MOUTH 2 (TWO) TIMES DAILY.     multivitamin with minerals tablet  Take 1 tablet by mouth daily.     nicotine 21 mg/24hr patch  Commonly known as:  NICODERM CQ - dosed in mg/24 hours  Place 21 mg onto the skin daily.     olopatadine 0.1 % ophthalmic solution  Commonly known as:  PATANOL  Place 1 drop into both eyes 2 (two) times daily.     PATADAY 0.2 % Soln  Generic drug:  Olopatadine HCl  Place 1 drop into both eyes daily.        Discharge Instructions: Please refer to Patient Instructions section of EMR for full details.  Patient was counseled important signs and symptoms that should prompt  return to medical care, changes in medications, dietary instructions, activity restrictions, and follow up appointments.   Follow-Up Appointments: Follow-up Information    Schedule an appointment as soon as possible for a visit with Lamar Blinks, MD.   Specialty:  Family Medicine   Why:  1 week for hospital follow up.    Contact information:   [place] [address] [phone]       Follow up with Cardiology .   Why:  You will be called with a follow up appointment.       Aquilla Hacker, MD 09/28/2014, 12:08 AM PGY-1, [place]

## 2014-09-24 NOTE — Progress Notes (Signed)
RT placed patient on CPAP of 11.5 cmH2O (home settings) via nasal mask.  Patient is tolerating at this time. RT will continue to monitor.    09/24/14 0136  BiPAP/CPAP/SIPAP  BiPAP/CPAP/SIPAP Pt Type Adult  Mask Type Nasal mask  Mask Size Medium  Respiratory Rate 18 breaths/min  EPAP 11.5 cmH2O (pt's home setting)  Oxygen Percent 21 %  BiPAP/CPAP/SIPAP CPAP  Patient Home Equipment No  Auto Titrate No  BiPAP/CPAP /SiPAP Vitals  Pulse Rate 86  Resp 18  SpO2 97 %  Bilateral Breath Sounds Diminished

## 2014-09-24 NOTE — Discharge Instructions (Signed)
Chest Pain (Nonspecific) °It is often hard to give a specific diagnosis for the cause of chest pain. There is always a chance that your pain could be related to something serious, such as a heart attack or a blood clot in the lungs. You need to follow up with your health care provider for further evaluation. °CAUSES  °· Heartburn. °· Pneumonia or bronchitis. °· Anxiety or stress. °· Inflammation around your heart (pericarditis) or lung (pleuritis or pleurisy). °· A blood clot in the lung. °· A collapsed lung (pneumothorax). It can develop suddenly on its own (spontaneous pneumothorax) or from trauma to the chest. °· Shingles infection (herpes zoster virus). °The chest wall is composed of bones, muscles, and cartilage. Any of these can be the source of the pain. °· The bones can be bruised by injury. °· The muscles or cartilage can be strained by coughing or overwork. °· The cartilage can be affected by inflammation and become sore (costochondritis). °DIAGNOSIS  °Lab tests or other studies may be needed to find the cause of your pain. Your health care provider may have you take a test called an ambulatory electrocardiogram (ECG). An ECG records your heartbeat patterns over a 24-hour period. You may also have other tests, such as: °· Transthoracic echocardiogram (TTE). During echocardiography, sound waves are used to evaluate how blood flows through your heart. °· Transesophageal echocardiogram (TEE). °· Cardiac monitoring. This allows your health care provider to monitor your heart rate and rhythm in real time. °· Holter monitor. This is a portable device that records your heartbeat and can help diagnose heart arrhythmias. It allows your health care provider to track your heart activity for several days, if needed. °· Stress tests by exercise or by giving medicine that makes the heart beat faster. °TREATMENT  °· Treatment depends on what may be causing your chest pain. Treatment may include: °¨ Acid blockers for  heartburn. °¨ Anti-inflammatory medicine. °¨ Pain medicine for inflammatory conditions. °¨ Antibiotics if an infection is present. °· You may be advised to change lifestyle habits. This includes stopping smoking and avoiding alcohol, caffeine, and chocolate. °· You may be advised to keep your head raised (elevated) when sleeping. This reduces the chance of acid going backward from your stomach into your esophagus. °Most of the time, nonspecific chest pain will improve within 2-3 days with rest and mild pain medicine.  °HOME CARE INSTRUCTIONS  °· If antibiotics were prescribed, take them as directed. Finish them even if you start to feel better. °· For the next few days, avoid physical activities that bring on chest pain. Continue physical activities as directed. °· Do not use any tobacco products, including cigarettes, chewing tobacco, or electronic cigarettes. °· Avoid drinking alcohol. °· Only take medicine as directed by your health care provider. °· Follow your health care provider's suggestions for further testing if your chest pain does not go away. °· Keep any follow-up appointments you made. If you do not go to an appointment, you could develop lasting (chronic) problems with pain. If there is any problem keeping an appointment, call to reschedule. °SEEK MEDICAL CARE IF:  °· Your chest pain does not go away, even after treatment. °· You have a rash with blisters on your chest. °· You have a fever. °SEEK IMMEDIATE MEDICAL CARE IF:  °· You have increased chest pain or pain that spreads to your arm, neck, jaw, back, or abdomen. °· You have shortness of breath. °· You have an increasing cough, or you cough   up blood.  You have severe back or abdominal pain.  You feel nauseous or vomit.  You have severe weakness.  You faint.  You have chills. This is an emergency. Do not wait to see if the pain will go away. Get medical help at once. Call your local emergency services (911 in [place] Do not drive  yourself to the hospital. MAKE SURE YOU:   Understand these instructions.  Will watch your condition.  Will get help right away if you are not doing well or get worse. Document Released: 01/31/2005 Document Revised: 04/28/2013 Document Reviewed: 11/27/2007 [facility] Patient Information 2015 [place], [place]. This information is not intended to replace advice given to you by your health care provider. Make sure you discuss any questions you have with your health care provider.   We are glad that you are feeling better.   1. It is important that you continue taking your medications at home. You should take a baby aspirin daily. You will also be taking Lipitor (atorvastatin) daily. This has been changed out for your Fenofibrate that you were taking previously.   2. Schedule a follow up appointment with your Primary Doctor as soon as you are able.   3. There are findings of enlarged lymph nodes on your chest CT scan. These should be discussed with your primary care provider, and a plan for follow up imaging determined.   Thanks for letting us take care of you.   Sincerely,  Paula Compton, MD Family Medicine - PGY 1

## 2014-09-24 NOTE — Consult Note (Signed)
CARDIOLOGY CONSULT NOTE       Patient ID: Susan [name] MRN: [id number] DOB/AGE: 01/22/1951 64 y.o.  Admit date: 09/23/2014 Referring Physician:  Scarlette Calico Primary Physician: Lamar Blinks, MD Primary Cardiologist:  Thompson Grayer Reason for Consultation:  Chest pain  Principal Problem:   Chest pain Active Problems:   Neck pain   Anxiety and depression   Fibromyalgia   Sleep apnea   HTN (hypertension)   Hyperlipidemia   Hypothyroidism   HPI:   64 yo obese white female admitted with palpitations and SSCP  Started yesterday afternoon.  Central heaviness/pressure.  Radiated to neck Intense Then had sensation of rapid palpitations Had ST with PVC;s on admission to ER  History of smoking OSA and previous stress test with Dr Rayann Heman about 10 years ago negative.  Pain lasted over an hour. Gone now and she feels much better ECG normal and troponins negative.  Wore her CPAP last night.  History of significant anxiety and fibromyalgia.    ROS All other systems reviewed and negative except as noted above  Past Medical History  Diagnosis Date  . Sleep apnea     on CPAP  . Osteopenia   . Fatty liver disease, nonalcoholic   . Obesity   . History of blood clots 2004    during cancer treatment  . Anxiety   . Osteoarthritis   . DDD (degenerative disc disease)   . DJD (degenerative joint disease)   . Cancer 2004    ductal carcinoma; lumpectomy  . Depression   . GERD (gastroesophageal reflux disease)   . Fibromyalgia   . Peripheral neuropathy   . Muscle spasm 12/30/2011  . Anemia 01/28/2012  . HTN (hypertension) 01/28/2012  . Hyperlipidemia 01/28/2012  . Hyperglycemia 01/28/2012  . Preventative health care 01/28/2012  . Pain of left heel 01/28/2012  . Nasal polyp 08/30/2012  . Tobacco abuse-unspec 10/12/2013  . Hip pain, right 10/12/2013  . Hypothyroidism 02/14/2014    Family History  Problem Relation Age of Onset  . Cancer Mother     liver cancer, hep c  . Heart disease Mother      chf  . Hypertension Mother   . Hyperlipidemia Mother   . Osteoporosis Mother   . Cancer Father     lung  . COPD Father   . Cancer Sister     breas at [place], non Hodgkin's Lymphoma  . Heart disease Sister     mvp  . Hypertension Brother   . Benign prostatic hyperplasia Brother   . Arthritis Brother   . Kidney disease Brother   . Other Daughter     Beal's Syndrome  . Arthritis Daughter     Beal's  . Arthritis Son     Beal's connective tissue disease  . Vision loss Maternal Grandmother   . Dementia Maternal Grandmother   . Vision loss Paternal Grandfather   . Hypertension Brother   . Benign prostatic hyperplasia Brother   . Cancer Brother     prostate  . Hypertension Brother   . COPD Brother   . Cancer Brother     prostate  . Hypertension Brother   . Hyperlipidemia Brother   . Cancer Brother     prostate  . Mental illness Brother     schizophrenia  . Cirrhosis Brother     hep c  . Hypertension Sister   . Other Sister     thalessemia, anemia  . Hyperlipidemia Sister   . Gout Sister   .  Other Daughter     overweight    History   Social History  . Marital Status: Married    Spouse Name: N/A  . Number of Children: N/A  . Years of Education: N/A   Occupational History  . Not on file.   Social History Main Topics  . Smoking status: Current Every Day Smoker -- 0.50 packs/day    Types: Cigarettes  . Smokeless tobacco: Never Used     Comment: Pt currently using nicotine patch.  tf,cma  . Alcohol Use: No  . Drug Use: No  . Sexual Activity: No   Other Topics Concern  . Not on file   Social History Narrative    Past Surgical History  Procedure Laterality Date  . Breast surgery  2004    lumpectomy  . Joint replacement      right total knee  . Bilateral oophorectomy  01/2003  . Nasal septum surgery    . Carpal tunnel release      right  . Abdominal hysterectomy  1991  . Thyroidectomy, partial  mid 80's    right side     . aspirin EC  325 mg  Oral Daily  . DULoxetine  90 mg Oral Daily  . fenofibrate  160 mg Oral Daily  . heparin  5,000 Units Subcutaneous 3 times per day  . hydrochlorothiazide  25 mg Oral Daily  . levothyroxine  25 mcg Oral QAC breakfast  . metoprolol  50 mg Oral BID  . mometasone-formoterol  2 puff Inhalation BID  . nicotine  21 mg Transdermal Daily  . pantoprazole  40 mg Oral Daily      Physical Exam: Blood pressure 110/45, pulse 64, temperature 98.3 F (36.8 C), temperature source Oral, resp. rate 18, height 5\' 5"  (1.651 m), weight 105.3 kg (232 lb 2.3 oz), SpO2 93 %.    Affect appropriate Obese white female HEENT: normal CPAP  Neck supple with no adenopathy JVP normal no bruits no thyromegaly Lungs clear with no wheezing and good diaphragmatic motion Heart:  S1/S2 no murmur, no rub, gallop or click PMI normal Abdomen: benighn, BS positve, no tenderness, no AAA no bruit.  No HSM or HJR Distal pulses intact with no bruits No edema Neuro non-focal Skin warm and dry No muscular weakness   Labs:   Lab Results  Component Value Date   WBC 7.1 09/24/2014   HGB 12.7 09/24/2014   HCT 36.7 09/24/2014   MCV 85.2 09/24/2014   PLT 243 09/24/2014    Recent Labs Lab 09/23/14 1717 09/24/14 0215  NA 142  --   K 3.4*  --   CL 100*  --   BUN 18  --   CREATININE 0.90 0.82  GLUCOSE 96  --    Lab Results  Component Value Date   TROPONINI <0.03 09/24/2014    Lab Results  Component Value Date   CHOL 133 09/24/2014   CHOL 142 02/02/2014   CHOL 123 03/02/2013   Lab Results  Component Value Date   HDL 30* 09/24/2014   HDL 35.60* 02/02/2014   HDL 38* 03/02/2013   Lab Results  Component Value Date   LDLCALC 75 09/24/2014   LDLCALC 84 02/02/2014   LDLCALC 59 03/02/2013   Lab Results  Component Value Date   TRIG 141 09/24/2014   TRIG 114.0 02/02/2014   TRIG 131 03/02/2013   Lab Results  Component Value Date   CHOLHDL 4.4 09/24/2014   CHOLHDL 4 02/02/2014  CHOLHDL 3.2 03/02/2013     No results found for: LDLDIRECT    Radiology: Dg Chest 2 View  09/23/2014   CLINICAL DATA:  Sharp left-sided chest pain.  EXAM: CHEST  2 VIEW  COMPARISON:  08/09/2013  FINDINGS: Grossly unchanged cardiac silhouette and mediastinal contours. No focal airspace opacities. No pleural effusion or pneumothorax. No evidence of edema. No acute osseus abnormalities.  IMPRESSION: No acute cardiopulmonary disease.   Electronically Signed   By: Sandi Mariscal M.D.   On: 09/23/2014 17:24   Ct Angio Chest Pe W/cm &/or Wo Cm  09/23/2014   CLINICAL DATA:  Acute onset of tachycardia and chest heaviness. Shortness of breath and weakness. Initial encounter.  EXAM: CT ANGIOGRAPHY CHEST WITH CONTRAST  TECHNIQUE: Multidetector CT imaging of the chest was performed using the standard protocol during bolus administration of intravenous contrast. Multiplanar CT image reconstructions and MIPs were obtained to evaluate the vascular anatomy.  CONTRAST:  164mL OMNIPAQUE IOHEXOL 350 MG/ML SOLN  COMPARISON:  CTA of the chest performed 09/03/2006, and chest radiograph performed earlier today at 5:01 p.m.  FINDINGS: There is no evidence of pulmonary embolus.  The lungs are essentially clear bilaterally. There is no evidence of significant focal consolidation, pleural effusion or pneumothorax. No masses are identified; no abnormal focal contrast enhancement is seen.  A 1.4 cm right hilar node is seen. Additional azygoesophageal recess and periaortic nodes measure up to 1.1 cm in short axis. No pericardial effusion is identified. The great vessels are grossly unremarkable in appearance. No axillary lymphadenopathy is seen. The right thyroid lobe is absent; the visualized portions of the left thyroid lobe are unremarkable.  There is diffuse fatty infiltration of the liver. The visualized portions of the spleen are unremarkable. The visualized portions of the adrenal glands, pancreas and kidneys are within normal limits.  No acute osseous  abnormalities are seen.  Review of the MIP images confirms the above findings.  IMPRESSION: 1. No evidence of pulmonary embolus. 2. Lungs clear bilaterally. 3. Prominent right hilar and mediastinal nodes noted, slightly more prominent than in 2008. This is nonspecific, but may reflect an underlying chronic systemic or inflammatory process.   Electronically Signed   By: Garald Balding M.D.   On: 09/23/2014 22:17    EKG:  SR normal    ASSESSMENT AND PLAN:  Chest Pain:  Resolved r/o normal ECG  Have ordered inpatient exercise tolerance test   OSA:  Discussed weight loss and continued use of CPAP  She has worn for 10 years Palpitations:  Telemetry benign  Increased risk of PAF with OSA  Continue telemetry Hold metoprolol for stress test HTN:  Well controlled.  Continue current medications and low sodium Dash type diet.   Smoking:  Counseled for less than 10 minutes  Will need CT f/u right hilar and mediastinal nodes with [facility]  Signed: Jenkins Rouge 09/24/2014, 8:02 AM

## 2014-09-25 ENCOUNTER — Telehealth: Payer: Self-pay | Admitting: Family Medicine

## 2014-09-25 DIAGNOSIS — R079 Chest pain, unspecified: Secondary | ICD-10-CM

## 2014-09-25 LAB — HEMOGLOBIN A1C
Hgb A1c MFr Bld: 6.6 % — ABNORMAL HIGH (ref 4.8–5.6)
MEAN PLASMA GLUCOSE: 143 mg/dL

## 2014-09-25 NOTE — Telephone Encounter (Signed)
I spoke with patient about her A1C result suggestive of DM2, she has been pre-diabetic for a while on diet control. I recommended recheck in 1-2 wk with her PCP and discuss need to start DM meds. She stated she has followup with Dr Lorelei Pont in 1 week. She will recheck A1C then if needed and or be started on Metformin.  She also mentioned she does not have her cardiologist number to call for appointment. She had seen Dr Thompson Grayer in the past. I gave her phone number to call for appointment and also placed referral to his office.  Note forwarded to Dr Edilia Bo.

## 2014-09-29 ENCOUNTER — Ambulatory Visit (INDEPENDENT_AMBULATORY_CARE_PROVIDER_SITE_OTHER): Payer: 59 | Admitting: Family Medicine

## 2014-09-29 VITALS — BP 160/85 | HR 65 | Temp 98.4°F | Resp 18 | Ht 64.0 in | Wt 233.4 lb

## 2014-09-29 DIAGNOSIS — R911 Solitary pulmonary nodule: Secondary | ICD-10-CM

## 2014-09-29 DIAGNOSIS — I10 Essential (primary) hypertension: Secondary | ICD-10-CM

## 2014-09-29 DIAGNOSIS — Z87898 Personal history of other specified conditions: Secondary | ICD-10-CM

## 2014-09-29 DIAGNOSIS — E119 Type 2 diabetes mellitus without complications: Secondary | ICD-10-CM | POA: Insufficient documentation

## 2014-09-29 DIAGNOSIS — E669 Obesity, unspecified: Secondary | ICD-10-CM | POA: Diagnosis not present

## 2014-09-29 DIAGNOSIS — Z23 Encounter for immunization: Secondary | ICD-10-CM

## 2014-09-29 MED ORDER — METFORMIN HCL 500 MG PO TABS: 500.0000 mg | ORAL_TABLET | Freq: Two times a day (BID) | ORAL | Status: DC

## 2014-09-29 NOTE — Progress Notes (Signed)
[facility] [address] [place] [address] [phone]  Date:  09/29/2014   Name:  Susan [name]   DOB:  22-May-1950   MRN:  [id number]  PCP:  Lamar Blinks, MD    Chief Complaint: Follow-up   History of Present Illness:  Susan [name] is a 64 y.o. very pleasant female patient who presents with the following:  Here today to follow-up.  She went to the ER with CP on 5/19, admitted overnight and released.  She had negative cardiac enzymes and a stress test that looked ok, plan to follow-up with cardiology.  Started on asa and statin, continuined her other BP medications.  She was also noted to have a right hilar node on CT that needs follow-up, also noted to have an A1c of 6.6%. She was not started on diabetes medication while inpt.  This is a new dx of DM  She was also noted to have slightly enlarged hilar nodes (compared with 2008).  She is concerned about this.  She is not sure what follow-up is needed.    She had breast cancer in 2004.   Her father did have lung cancer. Her older sister diet a couple of years ago due to B cell lymphoma following breast cancer.    She will see Dr. Irish Lack tomorrow and she will see Dr. Lamonte Sakai on 6/2- pulmonology.    CT angio from 5/19 IMPRESSION: 1. No evidence of pulmonary embolus. 2. Lungs clear bilaterally. 3. Prominent right hilar and mediastinal nodes noted, slightly more prominent than in 2008. This is nonspecific, but may reflect an underlying chronic systemic or inflammatory process.   BP Readings from Last 3 Encounters:  09/29/14 164/84  09/24/14 139/60  09/20/14 145/67     Patient Active Problem List   Diagnosis Date Noted  . Chest pain 09/23/2014  . Hypothyroidism 02/14/2014  . Tobacco abuse-unspec 10/12/2013  . Hip pain, right 10/12/2013  . Asthma with acute exacerbation 08/25/2013  . Cervical cancer screening 03/09/2013  . Seasonal allergies 09/12/2012  . Nasal polyp 08/30/2012  .  SOM (serous otitis media) 08/30/2012  . Dysuria 07/20/2012  . Peripheral neuropathy 07/19/2012  . Cervical myofascial pain syndrome 06/08/2012  . Anemia 01/28/2012  . HTN (hypertension) 01/28/2012  . Hyperlipidemia 01/28/2012  . Hyperglycemia 01/28/2012  . Preventative health care 01/28/2012  . Pain of left heel 01/28/2012  . Neck pain 12/30/2011  . Muscle spasm 12/30/2011  . Anxiety and depression   . Osteoarthritis   . Cancer   . GERD (gastroesophageal reflux disease)   . Fibromyalgia   . Sleep apnea   . Obesity   . Osteopenia   . History of blood clots   . Fatty liver disease, nonalcoholic   . DDD (degenerative disc disease)     Past Medical History  Diagnosis Date  . Sleep apnea     on CPAP  . Osteopenia   . Fatty liver disease, nonalcoholic   . Obesity   . History of blood clots 2004    during cancer treatment  . Anxiety   . Osteoarthritis   . DDD (degenerative disc disease)   . DJD (degenerative joint disease)   . Cancer 2004    ductal carcinoma; lumpectomy  . Depression   . GERD (gastroesophageal reflux disease)   . Fibromyalgia   . Peripheral neuropathy   . Muscle spasm 12/30/2011  . Anemia 01/28/2012  . HTN (hypertension) 01/28/2012  . Hyperlipidemia 01/28/2012  . Hyperglycemia  01/28/2012  . Preventative health care 01/28/2012  . Pain of left heel 01/28/2012  . Nasal polyp 08/30/2012  . Tobacco abuse-unspec 10/12/2013  . Hip pain, right 10/12/2013  . Hypothyroidism 02/14/2014    Past Surgical History  Procedure Laterality Date  . Breast surgery  2004    lumpectomy  . Joint replacement      right total knee  . Bilateral oophorectomy  01/2003  . Nasal septum surgery    . Carpal tunnel release      right  . Abdominal hysterectomy  1991  . Thyroidectomy, partial  mid 80's    right side    History  Substance Use Topics  . Smoking status: Current Every Day Smoker -- 0.50 packs/day    Types: Cigarettes  . Smokeless tobacco: Never Used     Comment: Pt  currently using nicotine patch.  tf,cma  . Alcohol Use: No    Family History  Problem Relation Age of Onset  . Cancer Mother     liver cancer, hep c  . Heart disease Mother     chf  . Hypertension Mother   . Hyperlipidemia Mother   . Osteoporosis Mother   . Cancer Father     lung  . COPD Father   . Cancer Sister     breas at [place], non Hodgkin's Lymphoma  . Heart disease Sister     mvp  . Hypertension Brother   . Benign prostatic hyperplasia Brother   . Arthritis Brother   . Kidney disease Brother   . Other Daughter     Beal's Syndrome  . Arthritis Daughter     Beal's  . Arthritis Son     Beal's connective tissue disease  . Vision loss Maternal Grandmother   . Dementia Maternal Grandmother   . Vision loss Paternal Grandfather   . Hypertension Brother   . Benign prostatic hyperplasia Brother   . Cancer Brother     prostate  . Hypertension Brother   . COPD Brother   . Cancer Brother     prostate  . Hypertension Brother   . Hyperlipidemia Brother   . Cancer Brother     prostate  . Mental illness Brother     schizophrenia  . Cirrhosis Brother     hep c  . Hypertension Sister   . Other Sister     thalessemia, anemia  . Hyperlipidemia Sister   . Gout Sister   . Other Daughter     overweight    Allergies  Allergen Reactions  . Chantix [Varenicline]     "Felt like having mental breakdown"  . Penicillins Swelling  . Ciprofloxacin Other (See Comments)    Muscle tightness, tendon aching and pain  . Lyrica [Pregabalin] Swelling  . Neurontin [Gabapentin] Swelling  . Tamoxifen     Blood clots  . Ceclor [Cefaclor] Rash  . Sulfa Antibiotics Rash    Medication list has been reviewed and updated.  Current Outpatient Prescriptions on File Prior to Visit  Medication Sig Dispense Refill  . albuterol (PROVENTIL HFA;VENTOLIN HFA) 108 (90 BASE) MCG/ACT inhaler Inhale 2 puffs into the lungs every 4 (four) hours as needed for wheezing or shortness of breath (cough,  shortness of breath or wheezing.). 1 Inhaler 1  . ALPRAZolam (XANAX) 0.5 MG tablet Take 1 tablet (0.5 mg total) by mouth at bedtime as needed for anxiety. 30 tablet 0  . aspirin EC 81 MG tablet Take 1 tablet (81 mg total) by mouth daily.  30 tablet 0  . atorvastatin (LIPITOR) 20 MG tablet Take 1 tablet (20 mg total) by mouth daily at 6 PM. 30 tablet 0  . calcium carbonate (OS-CAL) 600 MG TABS Take 1,800 mg by mouth daily.    . cholecalciferol (VITAMIN D) 1000 UNITS tablet Take 1,000 Units by mouth daily.    . cyclobenzaprine (FLEXERIL) 10 MG tablet TAKE 1 TABLET (10 MG TOTAL) BY MOUTH 2 (TWO) TIMES DAILY AS NEEDED FOR MUSCLE SPASMS. 30 tablet 0  . DULoxetine (CYMBALTA) 30 MG capsule TAKE 3 CAPSULES BY MOUTH DAILY 90 capsule 5  . esomeprazole (NEXIUM) 40 MG capsule Take 1 capsule (40 mg total) by mouth daily before breakfast. (Patient taking differently: Take 20 mg by mouth daily before breakfast. ) 30 capsule 5  . Fluticasone-Salmeterol (ADVAIR DISKUS) 250-50 MCG/DOSE AEPB Inhale 1 puff into the lungs 2 (two) times daily. 60 each 2  . hydrochlorothiazide (HYDRODIURIL) 25 MG tablet TAKE 1 TABLET (25 MG TOTAL) BY MOUTH DAILY. 30 tablet 5  . HYDROcodone-acetaminophen (NORCO/VICODIN) 5-325 MG per tablet Take 1 tablet by mouth every 8 (eight) hours as needed. 30 tablet 0  . levothyroxine (SYNTHROID, LEVOTHROID) 25 MCG tablet TAKE 1 TABLET BY MOUTH EVERY DAY 30 tablet 2  . metoprolol (LOPRESSOR) 50 MG tablet TAKE 1 TABLET (50 MG TOTAL) BY MOUTH 2 (TWO) TIMES DAILY. 60 tablet 4  . Multiple Vitamins-Minerals (MULTIVITAMIN WITH MINERALS) tablet Take 1 tablet by mouth daily.    . nicotine (NICODERM CQ - DOSED IN MG/24 HOURS) 21 mg/24hr patch Place 21 mg onto the skin daily.    Marland Kitchen PATADAY 0.2 % SOLN Place 1 drop into both eyes daily.   2   No current facility-administered medications on file prior to visit.    Review of Systems:  As per HPI- otherwise negative.   Physical Examination: Filed Vitals:    09/29/14 0820  BP: 164/84  Pulse: 65  Temp: 98.4 F (36.9 C)  Resp: 18   Filed Vitals:   09/29/14 0820  Height: 5\' 4"  (1.626 m)  Weight: 233 lb 6.4 oz (105.87 kg)   Body mass index is 40.04 kg/(m^2). Ideal Body Weight: Weight in (lb) to have BMI = 25: 145.3  GEN: WDWN, NAD, Non-toxic, A & O x 3, obese, looks well HEENT: Atraumatic, Normocephalic. Neck supple. No masses, No LAD. Ears and Nose: No external deformity. CV: RRR, No M/G/R. No JVD. No thrill. No extra heart sounds. PULM: CTA B, no wheezes, crackles, rhonchi. No retractions. No resp. distress. No accessory muscle use. EXTR: No c/c/e NEURO Normal gait.  PSYCH: Normally interactive. Conversant. Not depressed or anxious appearing.  Calm demeanor.   She did have a prevnar back in 2014.  She would like to have pneumonax today.   Assessment and Plan: Diabetes mellitus type 2, controlled - Plan: metFORMIN (GLUCOPHAGE) 500 MG tablet  Pulmonary nodule - Plan: Pneumococcal polysaccharide vaccine 23-valent greater than or equal to 2yo subcutaneous/IM  History of chest pain  Essential hypertension  Obesity  Start on metformin for newly dx DM today.  Discussed exercise and goal of weight loss.  preumovax today. Follow-up in 3 months Recent negative eval for CP, she will see cardiology tomorrow Concern regarding COPD, lung nodule on CT scan.  She already plans to see pulmonology.  Discussed with radiologist on the phone- these nodes are very likely benign.  Message to Dr. Malvin Johns.   Signed Lamar Blinks, MD

## 2014-09-29 NOTE — Patient Instructions (Addendum)
You do have mild diabetes.  Start metformin once a day for one week, then increase to twice a day  As a diabetic, there are several things you can do to monitor your condition and maintain your health.  1. Check your feet daily for any skin breakdown 2. Exercise and keep track of your diet 3. Let us know before you run out of your medications 4. Get your annual flu shot, and ask if you need a pneumonia shot 5. Ask if you are up to date on your labs; you should have an A1c every 6 months, a urine protein test annually, and a cholesterol test annually.  Your doctor may decide to do labs more often if indicated 6. Take off your shoes and socks at each visit.  Be sure your doctor examines your feet.   7. Ask about your blood pressure.  Your goal is 130/ 80 or less 8. Get an annual eye exam.  Please ask your ophthalmologist to send [place] your report 9. Keep up with your dental cleanings and exams.    I will send a message to your pulmonologist about your recent CT scan results   Please keep an eye on your BP at home and let me know if it is running high  Let's recheck in 3 months.

## 2014-09-30 ENCOUNTER — Ambulatory Visit: Payer: 59 | Admitting: Family Medicine

## 2014-09-30 ENCOUNTER — Encounter: Payer: Self-pay | Admitting: Interventional Cardiology

## 2014-09-30 ENCOUNTER — Ambulatory Visit (INDEPENDENT_AMBULATORY_CARE_PROVIDER_SITE_OTHER): Payer: 59 | Admitting: Interventional Cardiology

## 2014-09-30 VITALS — BP 144/80 | HR 71 | Ht 65.0 in | Wt 227.8 lb

## 2014-09-30 DIAGNOSIS — R002 Palpitations: Secondary | ICD-10-CM

## 2014-09-30 NOTE — Progress Notes (Signed)
Cardiology Office Note   Date:  09/30/2014   ID:  Susan [name], DOB [birth year], MRN [id number]  PCP:  Susan Blinks, MD    No chief complaint on file. palpitations, chest pressure, SHOB   Wt Readings from Last 3 Encounters:  09/30/14 227 lb 12.8 oz (103.329 kg)  09/29/14 233 lb 6.4 oz (105.87 kg)  09/24/14 232 lb 2.3 oz (105.3 kg)       History of Present Illness: Susan [name] is a 64 y.o. female  Who was admitted to the hospital because she had the above symptoms. The onset was sudden. She felt her heart beating rapidly along with a tightness. She went to the emergency room. Initial ECG showed a heart rate of 126 which was sinus tachycardia. She ruled out for MI she underwent a stress test which was apparently negative for ischemia. She was sent home. No significant arrhythmia was noted on telemetry either.   Since leaving the hospital, she has continued to report intermittent palpitations. There is some chest tightness although this is now less frequent. Her most strenuous activity is walking. She does feel that her heart rate speeds up faster than it should. She also thinks that she may have some anxiety that she did not know about before.    Past Medical History  Diagnosis Date  . Sleep apnea     on CPAP  . Osteopenia   . Fatty liver disease, nonalcoholic   . Obesity   . History of blood clots 2004    during cancer treatment  . Anxiety   . Osteoarthritis   . DDD (degenerative disc disease)   . DJD (degenerative joint disease)   . Cancer 2004    ductal carcinoma; lumpectomy  . Depression   . GERD (gastroesophageal reflux disease)   . Fibromyalgia   . Peripheral neuropathy   . Muscle spasm 12/30/2011  . Anemia 01/28/2012  . HTN (hypertension) 01/28/2012  . Hyperlipidemia 01/28/2012  . Hyperglycemia 01/28/2012  . Preventative health care 01/28/2012  . Pain of left heel 01/28/2012  . Nasal polyp 08/30/2012  . Tobacco abuse-unspec 10/12/2013  . Hip pain,  right 10/12/2013  . Hypothyroidism 02/14/2014    Past Surgical History  Procedure Laterality Date  . Breast surgery  2004    lumpectomy  . Joint replacement      right total knee  . Bilateral oophorectomy  01/2003  . Nasal septum surgery    . Carpal tunnel release      right  . Abdominal hysterectomy  1991  . Thyroidectomy, partial  mid 80's    right side     Current Outpatient Prescriptions  Medication Sig Dispense Refill  . albuterol (PROVENTIL HFA;VENTOLIN HFA) 108 (90 BASE) MCG/ACT inhaler Inhale 2 puffs into the lungs every 4 (four) hours as needed for wheezing or shortness of breath (cough, shortness of breath or wheezing.). 1 Inhaler 1  . ALPRAZolam (XANAX) 0.5 MG tablet Take 1 tablet (0.5 mg total) by mouth at bedtime as needed for anxiety. 30 tablet 0  . aspirin EC 81 MG tablet Take 1 tablet (81 mg total) by mouth daily. 30 tablet 0  . atorvastatin (LIPITOR) 20 MG tablet Take 1 tablet (20 mg total) by mouth daily at 6 PM. 30 tablet 0  . calcium carbonate (OS-CAL) 600 MG TABS Take 1,800 mg by mouth daily.    . cholecalciferol (VITAMIN D) 1000 UNITS tablet Take 1,000 Units by mouth daily.    . cyclobenzaprine (FLEXERIL)  10 MG tablet TAKE 1 TABLET (10 MG TOTAL) BY MOUTH 2 (TWO) TIMES DAILY AS NEEDED FOR MUSCLE SPASMS. 30 tablet 0  . DULoxetine (CYMBALTA) 30 MG capsule TAKE 3 CAPSULES BY MOUTH DAILY 90 capsule 5  . Fluticasone-Salmeterol (ADVAIR DISKUS) 250-50 MCG/DOSE AEPB Inhale 1 puff into the lungs 2 (two) times daily. 60 each 2  . hydrochlorothiazide (HYDRODIURIL) 25 MG tablet TAKE 1 TABLET (25 MG TOTAL) BY MOUTH DAILY. 30 tablet 5  . HYDROcodone-acetaminophen (NORCO/VICODIN) 5-325 MG per tablet Take 1 tablet by mouth every 8 (eight) hours as needed. 30 tablet 0  . levothyroxine (SYNTHROID, LEVOTHROID) 25 MCG tablet TAKE 1 TABLET BY MOUTH EVERY DAY 30 tablet 2  . metFORMIN (GLUCOPHAGE) 500 MG tablet Take 1 tablet (500 mg total) by mouth 2 (two) times daily with a meal. 180  tablet 3  . metoprolol (LOPRESSOR) 50 MG tablet TAKE 1 TABLET (50 MG TOTAL) BY MOUTH 2 (TWO) TIMES DAILY. 60 tablet 4  . Multiple Vitamins-Minerals (MULTIVITAMIN WITH MINERALS) tablet Take 1 tablet by mouth daily.    . nicotine (NICODERM CQ - DOSED IN MG/24 HOURS) 21 mg/24hr patch Place 21 mg onto the skin daily.    Marland Kitchen OVER THE COUNTER MEDICATION Take 22.3 mg by mouth daily. ([place])    . PATADAY 0.2 % SOLN Place 1 drop into both eyes daily.   2   No current facility-administered medications for this visit.    Allergies:   Chantix; Ciprofloxacin; Penicillins; Tamoxifen; Lyrica; Neurontin; Ceclor; and Sulfa antibiotics    Social History:  The patient  reports that she has been smoking Cigarettes.  She has been smoking about 0.50 packs per day. She has never used smokeless tobacco. She reports that she does not drink alcohol or use illicit drugs.   Family History:  The patient's *family history includes Arthritis in her brother, daughter, and son; Benign prostatic hyperplasia in her brother and brother; COPD in her brother and father; Cancer in her brother, brother, brother, father, mother, and sister; Cirrhosis in her brother; Dementia in her maternal grandmother; Gout in her sister; Heart disease in her mother and sister; Hyperlipidemia in her brother, mother, and sister; Hypertension in her brother, brother, brother, brother, mother, and sister; Kidney disease in her brother; Mental illness in her brother; Osteoporosis in her mother; Other in her daughter, daughter, and sister; Vision loss in her maternal grandmother and paternal grandfather.    ROS:  Please see the history of present illness.   Otherwise, review of systems are positive for chest tightness, palpitations.   All other systems are reviewed and negative.    PHYSICAL EXAM: VS:  BP 144/80 mmHg  Pulse 71  Ht 5\' 5"  (1.651 m)  Wt 227 lb 12.8 oz (103.329 kg)  BMI 37.91 kg/m2  SpO2 98% , BMI Body mass index is 37.91 kg/(m^2). GEN:  Well nourished, well developed, in no acute distress HEENT: normal Neck: no JVD, carotid bruits, or masses Cardiac: *RRR; no murmurs, rubs, or gallops,no edema  Respiratory:  clear to auscultation bilaterally, normal work of breathing GI: soft, nontender, nondistended, + BS MS: no deformity or atrophy Skin: warm and dry, no rash Neuro:  Strength and sensation are intact Psych: euthymic mood, full aff   Recent Labs: 02/02/2014: ALT 38* 09/24/2014: BUN 14; Creatinine 0.80; Hemoglobin 12.7; Platelets 243; Potassium 3.8; Sodium 139; TSH 2.198   Lipid Panel    Component Value Date/Time   CHOL 133 09/24/2014 0215   TRIG 141 09/24/2014 0215  HDL 30* 09/24/2014 0215   CHOLHDL 4.4 09/24/2014 0215   VLDL 28 09/24/2014 0215   LDLCALC 75 09/24/2014 0215     Other studies Reviewed: Additional studies/ records that were reviewed today with results demonstrating: Hospital records reviewed..   ASSESSMENT AND PLAN:  1. Palpitations: Plan for 30 day event monitor. Thus far, all we have seen a sinus tachycardia on her ECG. Rule out a pathologic arrhythmia. 2. Chest tightness:  She already had a negative stress test. No coronary calcium as mentioned on her chest CT which was looking for pulmonary embolism. Would not plan further ischemia evaluation unless her symptoms got more frequent or more severe. Would have to consider cardiac cath at that time. She does have a long smoking history. 3. Tobacco abuse: I encouraged her to stop smoking. Some of her shortness of breath may be from this. She is using the patch and trying to cut back.   Current medicines are reviewed at length with the patient today.  The patient concerns regarding her medicines were addressed.  The following changes have been made:  No change  Labs/ tests ordered today include:   Orders Placed This Encounter  Procedures  . Cardiac event monitor    Recommend 150 minutes/week of aerobic exercise Low fat, low carb, high  fiber diet recommended  Disposition:   FU in 6-8 weeks   Teresita Madura., MD  09/30/2014 5:15 PM    [facility] [place], [place], [place]  [address] Phone: [phone]; Fax: [phone]

## 2014-09-30 NOTE — Patient Instructions (Signed)
Medication Instructions:  Same-No change  Labwork: None  Testing/Procedures: Your physician has recommended that you wear an event monitor for 30 days. Event monitors are medical devices that record the heart's electrical activity. Doctors most often [place] these monitors to diagnose arrhythmias. Arrhythmias are problems with the speed or rhythm of the heartbeat. The monitor is a small, portable device. You can wear one while you do your normal daily activities. This is usually used to diagnose what is causing palpitations/syncope (passing out).    Follow-Up: Your physician recommends that you schedule a follow-up appointment in: 6 to 8 weeks

## 2014-10-07 ENCOUNTER — Encounter: Payer: Self-pay | Admitting: Emergency Medicine

## 2014-10-07 ENCOUNTER — Ambulatory Visit (INDEPENDENT_AMBULATORY_CARE_PROVIDER_SITE_OTHER): Payer: 59 | Admitting: Emergency Medicine

## 2014-10-07 ENCOUNTER — Other Ambulatory Visit: Payer: 59

## 2014-10-07 VITALS — BP 112/62 | HR 65 | Ht 65.0 in | Wt 230.0 lb

## 2014-10-07 DIAGNOSIS — Z720 Tobacco use: Secondary | ICD-10-CM

## 2014-10-07 DIAGNOSIS — J45901 Unspecified asthma with (acute) exacerbation: Secondary | ICD-10-CM

## 2014-10-07 DIAGNOSIS — J449 Chronic obstructive pulmonary disease, unspecified: Secondary | ICD-10-CM | POA: Insufficient documentation

## 2014-10-07 DIAGNOSIS — R599 Enlarged lymph nodes, unspecified: Secondary | ICD-10-CM

## 2014-10-07 DIAGNOSIS — G4730 Sleep apnea, unspecified: Secondary | ICD-10-CM | POA: Diagnosis not present

## 2014-10-07 DIAGNOSIS — Z201 Contact with and (suspected) exposure to tuberculosis: Secondary | ICD-10-CM

## 2014-10-07 DIAGNOSIS — R590 Localized enlarged lymph nodes: Secondary | ICD-10-CM

## 2014-10-07 NOTE — Progress Notes (Signed)
Subjective:    Patient ID: Susan [name], female    DOB: 1950-11-25, 64 y.o.   MRN: [id number]  HPI 64 year old smoker (40 pack years) with a history of diabetes, left ductal carcinoma in situ treated with lumpectomy and tamoxifen, c/b VTE. Obstructive sleep apnea on CPAP, hypertension. I have reviewed the office notes from Dr. Lorelei Pont. She carries a history of asthma made in the last couple years when she had apparent flares. Has been treated with pred and abx on those occasions. Also started on Advair - stopped it about 10 days ago. She has a daily cough w gray phlegm - better w decreasing. She can exert indefinitely without SOB, works in her yard.   She underwent a CT scan of the chest on 09/23/14 in order to evaluate an episode of chest tightness and dyspnea, tachycardia that seemed to be best relieved by ativan. This study was reviewed by me today. It does not show any evidence of pulmonary embolism or infiltrate. I do not see any peripheral nodular disease. She does have some bilateral hilar and subcarinal lymphadenopathy. On comparison to her prior CT chest from 2008 the nodes are slightly larger. She does have a family hx of B cell lymphoma.  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 She has lived in [place], including [place].    Review of Systems  Constitutional: Negative for fever and unexpected weight change.  HENT: Positive for congestion. Negative for dental problem, ear pain, nosebleeds, postnasal drip, rhinorrhea, sinus pressure, sneezing, sore throat and trouble swallowing.   Eyes: Negative for redness and itching.  Respiratory: Positive for cough. Negative for chest tightness, shortness of breath and wheezing.   Cardiovascular: Negative for palpitations and leg swelling.  Gastrointestinal: Negative for nausea and vomiting.  Genitourinary: Negative for dysuria.  Musculoskeletal: Negative for joint swelling.  Skin: Negative for rash.  Neurological: Negative for  headaches.  Hematological: Does not bruise/bleed easily.  Psychiatric/Behavioral: Negative for dysphoric mood. The patient is not nervous/anxious.     Past Medical History  Diagnosis Date  . Sleep apnea     on CPAP  . Osteopenia   . Fatty liver disease, nonalcoholic   . Obesity   . History of blood clots 2004    during cancer treatment  . Anxiety   . Osteoarthritis   . DDD (degenerative disc disease)   . DJD (degenerative joint disease)   . Cancer 2004    ductal carcinoma; lumpectomy  . Depression   . GERD (gastroesophageal reflux disease)   . Fibromyalgia   . Peripheral neuropathy   . Muscle spasm 12/30/2011  . Anemia 01/28/2012  . HTN (hypertension) 01/28/2012  . Hyperlipidemia 01/28/2012  . Hyperglycemia 01/28/2012  . Preventative health care 01/28/2012  . Pain of left heel 01/28/2012  . Nasal polyp 08/30/2012  . Tobacco abuse-unspec 10/12/2013  . Hip pain, right 10/12/2013  . Hypothyroidism 02/14/2014     Family History  Problem Relation Age of Onset  . Cancer Mother     liver cancer, hep c  . Heart disease Mother     chf  . Hypertension Mother   . Hyperlipidemia Mother   . Osteoporosis Mother   . Cancer Father     lung  . COPD Father   . Cancer Sister     breas at [place], non Hodgkin's Lymphoma  . Heart disease Sister     mvp  . Hypertension Brother   . Benign prostatic hyperplasia Brother   .  Arthritis Brother   . Kidney disease Brother   . Other Daughter     Beal's Syndrome  . Arthritis Daughter     Beal's  . Arthritis Son     Beal's connective tissue disease  . Vision loss Maternal Grandmother   . Dementia Maternal Grandmother   . Vision loss Paternal Grandfather   . Hypertension Brother   . Benign prostatic hyperplasia Brother   . Cancer Brother     prostate  . Hypertension Brother   . COPD Brother   . Cancer Brother     prostate  . Hypertension Brother   . Hyperlipidemia Brother   . Cancer Brother     prostate  . Mental illness Brother      schizophrenia  . Cirrhosis Brother     hep c  . Hypertension Sister   . Other Sister     thalessemia, anemia  . Hyperlipidemia Sister   . Gout Sister   . Other Daughter     overweight     History   Social History  . Marital Status: Married    Spouse Name: N/A  . Number of Children: N/A  . Years of Education: N/A   Occupational History  . Not on file.   Social History Main Topics  . Smoking status: Light Tobacco Smoker -- 0.50 packs/day    Types: Cigarettes  . Smokeless tobacco: Never Used     Comment: Pt currently using nicotine patch.  tf,cma  . Alcohol Use: No  . Drug Use: No  . Sexual Activity: No   Other Topics Concern  . Not on file   Social History Narrative  She has worked as a Marine scientist.   Allergies  Allergen Reactions  . Chantix [Varenicline] Other (See Comments)    "Felt like having mental breakdown"  . Ciprofloxacin Other (See Comments)    Muscle tightness, tendon aching and pain  . Penicillins Swelling  . Tamoxifen Other (See Comments)    Blood clots  . Lyrica [Pregabalin] Swelling  . Neurontin [Gabapentin] Swelling  . Ceclor [Cefaclor] Rash  . Sulfa Antibiotics Rash     Outpatient Prescriptions Prior to Visit  Medication Sig Dispense Refill  . albuterol (PROVENTIL HFA;VENTOLIN HFA) 108 (90 BASE) MCG/ACT inhaler Inhale 2 puffs into the lungs every 4 (four) hours as needed for wheezing or shortness of breath (cough, shortness of breath or wheezing.). 1 Inhaler 1  . ALPRAZolam (XANAX) 0.5 MG tablet Take 1 tablet (0.5 mg total) by mouth at bedtime as needed for anxiety. 30 tablet 0  . aspirin EC 81 MG tablet Take 1 tablet (81 mg total) by mouth daily. 30 tablet 0  . atorvastatin (LIPITOR) 20 MG tablet Take 1 tablet (20 mg total) by mouth daily at 6 PM. 30 tablet 0  . calcium carbonate (OS-CAL) 600 MG TABS Take 1,800 mg by mouth daily.    . cholecalciferol (VITAMIN D) 1000 UNITS tablet Take 1,000 Units by mouth daily.    . cyclobenzaprine (FLEXERIL) 10  MG tablet TAKE 1 TABLET (10 MG TOTAL) BY MOUTH 2 (TWO) TIMES DAILY AS NEEDED FOR MUSCLE SPASMS. 30 tablet 0  . DULoxetine (CYMBALTA) 30 MG capsule TAKE 3 CAPSULES BY MOUTH DAILY 90 capsule 5  . Fluticasone-Salmeterol (ADVAIR DISKUS) 250-50 MCG/DOSE AEPB Inhale 1 puff into the lungs 2 (two) times daily. 60 each 2  . hydrochlorothiazide (HYDRODIURIL) 25 MG tablet TAKE 1 TABLET (25 MG TOTAL) BY MOUTH DAILY. 30 tablet 5  . HYDROcodone-acetaminophen (NORCO/VICODIN) 5-325  MG per tablet Take 1 tablet by mouth every 8 (eight) hours as needed. 30 tablet 0  . levothyroxine (SYNTHROID, LEVOTHROID) 25 MCG tablet TAKE 1 TABLET BY MOUTH EVERY DAY 30 tablet 2  . metFORMIN (GLUCOPHAGE) 500 MG tablet Take 1 tablet (500 mg total) by mouth 2 (two) times daily with a meal. 180 tablet 3  . metoprolol (LOPRESSOR) 50 MG tablet TAKE 1 TABLET (50 MG TOTAL) BY MOUTH 2 (TWO) TIMES DAILY. 60 tablet 4  . Multiple Vitamins-Minerals (MULTIVITAMIN WITH MINERALS) tablet Take 1 tablet by mouth daily.    . nicotine (NICODERM CQ - DOSED IN MG/24 HOURS) 21 mg/24hr patch Place 21 mg onto the skin daily.    Marland Kitchen OVER THE COUNTER MEDICATION Take 22.3 mg by mouth daily. ([place])    . PATADAY 0.2 % SOLN Place 1 drop into both eyes daily.   2   No facility-administered medications prior to visit.         Objective:   Physical Exam Filed Vitals:   10/07/14 1039  BP: 112/62  Pulse: 65  Height: 5\' 5"  (1.651 m)  Weight: 230 lb (104.327 kg)  SpO2: 95%   Gen: Pleasant, overwt, in no distress,  normal affect  ENT: No lesions,  mouth clear,  oropharynx clear, no postnasal drip  Neck: No JVD, no TMG, no carotid bruits  Lungs: No use of accessory muscles, clear without rales or rhonchi  Cardiovascular: RRR, heart sounds normal, no murmur or gallops, no peripheral edema  Musculoskeletal: No deformities, no cyanosis or clubbing  Neuro: alert, non focal  Skin: Warm, no lesions or rashes     Assessment & Plan:  Sleep  apnea Continue CPAP as ordered   Tobacco abuse-unspec Discussed cigarette smoking in detail. We will work hard on cessation. She is using a nicotine patch. She is quite motivated and I suspect mobile to set a quit date soon. Once she has stopped we will talk about any benefit from low-dose CT scan screening. I suspect that she will need to have serial CTs for her lymphadenopathy which will take the place of LDCT.    COPD (chronic obstructive pulmonary disease) She has a significant tobacco history and I suspect that her episodes of asthmatic bronchitis that she has experienced over the last couple years have been acute exacerbations of COPD. She does not have daily symptoms when she is at baseline. I agree with stopping Advair at this time. I think the most important thing first to do is to concentrate on smoking cessation. I will get full pulmonary function testing to assess lung function and based on this and her clinical status we will decide whether she needs to be on scheduled bronchodilators.    Mediastinal lymphadenopathy She has bilateral hilar and subcarinal lymphadenopathy that dates back to 2008. Her CT scan of the chest performed 09/23/14 showed some enlargement of each of these nodes. I discussed in detail with her the different possible causes including inflammatory exposures, infection, or possibly even an indolent lymphoma. 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 She also lived in the [facility] and is at some risk for histoplasma. Importantly she also has a family history of B-cell lymphoma. Based on the slow change in size I do not believe she needs a biopsy urgently but I do believe we need to follow serial CT scans. Depending on her clinical status and how the nodes change then we may  decide to pursue biopsy either via EBUS or EUS. I believe the nodes are probably too low to be reached by mediastinoscopy. I'll discuss the timing of  her repeat scan with her at our next visit. In the meantime I'll check a quantiferon gold

## 2014-10-07 NOTE — Assessment & Plan Note (Signed)
Discussed cigarette smoking in detail. We will work hard on cessation. She is using a nicotine patch. She is quite motivated and I suspect mobile to set a quit date soon. Once she has stopped we will talk about any benefit from low-dose CT scan screening. I suspect that she will need to have serial CTs for her lymphadenopathy which will take the place of LDCT.

## 2014-10-07 NOTE — Assessment & Plan Note (Signed)
Continue CPAP as ordered 

## 2014-10-07 NOTE — Assessment & Plan Note (Signed)
She has bilateral hilar and subcarinal lymphadenopathy that dates back to 2008. Her CT scan of the chest performed 09/23/14 showed some enlargement of each of these nodes. I discussed in detail with her the different possible causes including inflammatory exposures, infection, or possibly even an indolent lymphoma. 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 She also lived in the [facility] and is at some risk for histoplasma. Importantly she also has a family history of B-cell lymphoma. Based on the slow change in size I do not believe she needs a biopsy urgently but I do believe we need to follow serial CT scans. Depending on her clinical status and how the nodes change then we may decide to pursue biopsy either via EBUS or EUS. I believe the nodes are probably too low to be reached by mediastinoscopy. I'll discuss the timing of her repeat scan with her at our next visit. In the meantime I'll check a quantiferon gold

## 2014-10-07 NOTE — Patient Instructions (Signed)
Please stay off Advair for now Keep albuterol available to use 2 puffs if needed for shortness of breath We will perform full pulmonary function testing at your next office visit We will discuss the timing of a repeat CT scan of the chest to follow your lymph nodes at your next visit We will perform blood work (quantiferon gold for TB exposure) today Continue to work hard on decreasing her smoking. Uses sure nicotine patches and then set a quit date in preparation for decreasing to zero.  Follow with Dr Lamonte Sakai next available with full PFT

## 2014-10-07 NOTE — Assessment & Plan Note (Signed)
She has a significant tobacco history and I suspect that her episodes of asthmatic bronchitis that she has experienced over the last couple years have been acute exacerbations of COPD. She does not have daily symptoms when she is at baseline. I agree with stopping Advair at this time. I think the most important thing first to do is to concentrate on smoking cessation. I will get full pulmonary function testing to assess lung function and based on this and her clinical status we will decide whether she needs to be on scheduled bronchodilators.

## 2014-10-11 ENCOUNTER — Ambulatory Visit (INDEPENDENT_AMBULATORY_CARE_PROVIDER_SITE_OTHER): Payer: 59

## 2014-10-11 DIAGNOSIS — R002 Palpitations: Secondary | ICD-10-CM

## 2014-10-12 LAB — QUANTIFERON IN TUBE
QFT TB AG MINUS NIL VALUE: 0.37 [IU]/mL
QUANTIFERON MITOGEN VALUE: >10 IU/mL
QUANTIFERON NIL VALUE: 0.37 [IU]/mL
QUANTIFERON TB AG VALUE: 0.74 IU/mL
QUANTIFERON TB GOLD: POSITIVE — AB

## 2014-10-12 LAB — QUANTIFERON TB GOLD ASSAY (BLOOD)

## 2014-10-21 ENCOUNTER — Other Ambulatory Visit: Payer: Self-pay | Admitting: Family Medicine

## 2014-11-01 ENCOUNTER — Other Ambulatory Visit: Payer: Self-pay

## 2014-11-18 ENCOUNTER — Ambulatory Visit: Payer: 59 | Admitting: Interventional Cardiology

## 2014-11-26 ENCOUNTER — Ambulatory Visit (INDEPENDENT_AMBULATORY_CARE_PROVIDER_SITE_OTHER): Payer: 59 | Admitting: Emergency Medicine

## 2014-11-26 ENCOUNTER — Encounter: Payer: Self-pay | Admitting: Emergency Medicine

## 2014-11-26 VITALS — BP 136/64 | HR 79 | Ht 64.5 in | Wt 225.0 lb

## 2014-11-26 DIAGNOSIS — J45901 Unspecified asthma with (acute) exacerbation: Secondary | ICD-10-CM | POA: Diagnosis not present

## 2014-11-26 DIAGNOSIS — J449 Chronic obstructive pulmonary disease, unspecified: Secondary | ICD-10-CM | POA: Diagnosis not present

## 2014-11-26 DIAGNOSIS — R590 Localized enlarged lymph nodes: Secondary | ICD-10-CM

## 2014-11-26 DIAGNOSIS — R599 Enlarged lymph nodes, unspecified: Secondary | ICD-10-CM | POA: Diagnosis not present

## 2014-11-26 DIAGNOSIS — Z720 Tobacco use: Secondary | ICD-10-CM

## 2014-11-26 LAB — PULMONARY FUNCTION TEST
DL/VA % pred: 107 %
DL/VA: 5.22 ml/min/mmHg/L
DLCO unc % pred: 101 %
DLCO unc: 25.3 ml/min/mmHg
FEF 25-75 Post: 3 L/sec
FEF 25-75 Pre: 2.98 L/sec
FEF2575-%Change-Post: 0 %
FEF2575-%Pred-Post: 135 %
FEF2575-%Pred-Pre: 134 %
FEV1-%Change-Post: 0 %
FEV1-%Pred-Post: 103 %
FEV1-%Pred-Pre: 103 %
FEV1-Post: 2.59 L
FEV1-Pre: 2.6 L
FEV1FVC-%Change-Post: 4 %
FEV1FVC-%Pred-Pre: 106 %
FEV6-%Change-Post: -2 %
FEV6-%Pred-Post: 96 %
FEV6-%Pred-Pre: 98 %
FEV6-Post: 3.02 L
FEV6-Pre: 3.11 L
FEV6FVC-%Change-Post: 1 %
FEV6FVC-%Pred-Post: 104 %
FEV6FVC-%Pred-Pre: 102 %
FVC-%Change-Post: -4 %
FVC-%Pred-Post: 92 %
FVC-%Pred-Pre: 96 %
FVC-Post: 3.02 L
FVC-Pre: 3.15 L
Post FEV1/FVC ratio: 86 %
Post FEV6/FVC ratio: 100 %
Pre FEV1/FVC ratio: 82 %
Pre FEV6/FVC Ratio: 99 %
RV % pred: 69 %
RV: 1.46 L
TLC % pred: 96 %
TLC: 4.93 L

## 2014-11-26 NOTE — Patient Instructions (Signed)
We discussed ear cigarettes today. Continue to cut down to 3-4 cigarettes daily. Then work on setting a quit date and preparing for her quit date as we've discussed. Don't forget to use 1-800-quit-now as a resource.  Stay off Advair Keep albuterol available to use as needed We will plan to repeat her CT scan of the chest in May 2017 We will follow-up in 5 months

## 2014-11-26 NOTE — Progress Notes (Signed)
PFT done today. 

## 2014-11-26 NOTE — Assessment & Plan Note (Signed)
Pulmonary function testing today does not support obstructive lung disease. Discussed this with her today. We will not start scheduled bronchodilators. The most important thing for Susan [name] to do is to continue to work on her smoking cessation. We discussed strategies for this today in detail

## 2014-11-26 NOTE — Assessment & Plan Note (Signed)
By CT scan of the chest. Last scan was in 09/2014 with some slight increase in size compared with 2008. Believe that we can repeat her scan in May 2017 to look for interval change. Depending on the results. May decide to pursue biopsy

## 2014-11-26 NOTE — Progress Notes (Signed)
Subjective:    Patient ID: Susan [name], female    DOB: Feb 20, 1951, 64 y.o.   MRN: [id number]  HPI 64 year old smoker (40 pack years) with a history of diabetes, left ductal carcinoma in situ treated with lumpectomy and tamoxifen, c/b VTE. Obstructive sleep apnea on CPAP, hypertension. I have reviewed the office notes from Dr. Lorelei Pont. She carries a history of asthma made in the last couple years when she had apparent flares. Has been treated with pred and abx on those occasions. Also started on Advair - stopped it about 10 days ago. She has a daily cough w gray phlegm - better w decreasing. She can exert indefinitely without SOB, works in her yard.   She underwent a CT scan of the chest on 09/23/14 in order to evaluate an episode of chest tightness and dyspnea, tachycardia that seemed to be best relieved by ativan. This study was reviewed by me today. It does not show any evidence of pulmonary embolism or infiltrate. I do not see any peripheral nodular disease. She does have some bilateral hilar and subcarinal lymphadenopathy. On comparison to her prior CT chest from 2008 the nodes are slightly larger. She does have a family hx of B cell lymphoma.  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 She has lived in [place], including [place].   [place] 11/26/14 -- follow-up visit for suspected asthma and a history of lymphadenopathy that was first done fine in 2008 and then re-characterized on CT scan of the chest from 09/23/14. She underwent full pulmonary function testing today which I have reviewed personally. This shows normal airflows without a bronchodilator response, possible restriction as identified by a mildly reduced residual volume and a normal diffusion capacity. Last time we empirically stopped Advair. She has decreased her cigarettes significantly, now at 3-4 cig a day. We discussed tools and techniques today to stop, to help with her quit date. She uses albuterol sometimes to relieve cough  and clear mucous.     Review of Systems  Constitutional: Negative for fever and unexpected weight change.  HENT: Positive for congestion. Negative for dental problem, ear pain, nosebleeds, postnasal drip, rhinorrhea, sinus pressure, sneezing, sore throat and trouble swallowing.   Eyes: Negative for redness and itching.  Respiratory: Positive for cough. Negative for chest tightness, shortness of breath and wheezing.   Cardiovascular: Negative for palpitations and leg swelling.  Gastrointestinal: Negative for nausea and vomiting.  Genitourinary: Negative for dysuria.  Musculoskeletal: Negative for joint swelling.  Skin: Negative for rash.  Neurological: Negative for headaches.  Hematological: Does not bruise/bleed easily.  Psychiatric/Behavioral: Negative for dysphoric mood. The patient is not nervous/anxious.     Past Medical History  Diagnosis Date  . Sleep apnea     on CPAP  . Osteopenia   . Fatty liver disease, nonalcoholic   . Obesity   . History of blood clots 2004    during cancer treatment  . Anxiety   . Osteoarthritis   . DDD (degenerative disc disease)   . DJD (degenerative joint disease)   . Cancer 2004    ductal carcinoma; lumpectomy  . Depression   . GERD (gastroesophageal reflux disease)   . Fibromyalgia   . Peripheral neuropathy   . Muscle spasm 12/30/2011  . Anemia 01/28/2012  . HTN (hypertension) 01/28/2012  . Hyperlipidemia 01/28/2012  . Hyperglycemia 01/28/2012  . Preventative health care 01/28/2012  . Pain of left heel 01/28/2012  . Nasal polyp 08/30/2012  . Tobacco  abuse-unspec 10/12/2013  . Hip pain, right 10/12/2013  . Hypothyroidism 02/14/2014     Family History  Problem Relation Age of Onset  . Cancer Mother     liver cancer, hep c  . Heart disease Mother     chf  . Hypertension Mother   . Hyperlipidemia Mother   . Osteoporosis Mother   . Cancer Father     lung  . COPD Father   . Cancer Sister     breas at [place], non Hodgkin's Lymphoma  . Heart  disease Sister     mvp  . Hypertension Brother   . Benign prostatic hyperplasia Brother   . Arthritis Brother   . Kidney disease Brother   . Other Daughter     Beal's Syndrome  . Arthritis Daughter     Beal's  . Arthritis Son     Beal's connective tissue disease  . Vision loss Maternal Grandmother   . Dementia Maternal Grandmother   . Vision loss Paternal Grandfather   . Hypertension Brother   . Benign prostatic hyperplasia Brother   . Cancer Brother     prostate  . Hypertension Brother   . COPD Brother   . Cancer Brother     prostate  . Hypertension Brother   . Hyperlipidemia Brother   . Cancer Brother     prostate  . Mental illness Brother     schizophrenia  . Cirrhosis Brother     hep c  . Hypertension Sister   . Other Sister     thalessemia, anemia  . Hyperlipidemia Sister   . Gout Sister   . Other Daughter     overweight     History   Social History  . Marital Status: Married    Spouse Name: N/A  . Number of Children: N/A  . Years of Education: N/A   Occupational History  . Not on file.   Social History Main Topics  . Smoking status: Light Tobacco Smoker -- 0.50 packs/day    Types: Cigarettes  . Smokeless tobacco: Never Used     Comment: Pt currently using nicotine patch.  tf,cma  . Alcohol Use: No  . Drug Use: No  . Sexual Activity: No   Other Topics Concern  . Not on file   Social History Narrative  She has worked as a Marine scientist.   Allergies  Allergen Reactions  . Chantix [Varenicline] Other (See Comments)    "Felt like having mental breakdown"  . Ciprofloxacin Other (See Comments)    Muscle tightness, tendon aching and pain  . Penicillins Swelling  . Tamoxifen Other (See Comments)    Blood clots  . Lyrica [Pregabalin] Swelling  . Neurontin [Gabapentin] Swelling  . Ceclor [Cefaclor] Rash  . Sulfa Antibiotics Rash     Outpatient Prescriptions Prior to Visit  Medication Sig Dispense Refill  . albuterol (PROVENTIL HFA;VENTOLIN HFA)  108 (90 BASE) MCG/ACT inhaler Inhale 2 puffs into the lungs every 4 (four) hours as needed for wheezing or shortness of breath (cough, shortness of breath or wheezing.). 1 Inhaler 1  . ALPRAZolam (XANAX) 0.5 MG tablet Take 1 tablet (0.5 mg total) by mouth at bedtime as needed for anxiety. 30 tablet 0  . aspirin EC 81 MG tablet Take 1 tablet (81 mg total) by mouth daily. 30 tablet 0  . atorvastatin (LIPITOR) 20 MG tablet Take 1 tablet (20 mg total) by mouth daily at 6 PM. 30 tablet 0  . calcium carbonate (OS-CAL) 600 MG  TABS Take 1,800 mg by mouth daily.    . cholecalciferol (VITAMIN D) 1000 UNITS tablet Take 1,000 Units by mouth daily.    . cyclobenzaprine (FLEXERIL) 10 MG tablet TAKE 1 TABLET (10 MG TOTAL) BY MOUTH 2 (TWO) TIMES DAILY AS NEEDED FOR MUSCLE SPASMS. 30 tablet 0  . DULoxetine (CYMBALTA) 30 MG capsule TAKE 3 CAPSULES BY MOUTH DAILY 90 capsule 5  . Fluticasone-Salmeterol (ADVAIR DISKUS) 250-50 MCG/DOSE AEPB Inhale 1 puff into the lungs 2 (two) times daily. 60 each 2  . hydrochlorothiazide (HYDRODIURIL) 25 MG tablet TAKE 1 TABLET (25 MG TOTAL) BY MOUTH DAILY. 30 tablet 5  . HYDROcodone-acetaminophen (NORCO/VICODIN) 5-325 MG per tablet Take 1 tablet by mouth every 8 (eight) hours as needed. 30 tablet 0  . levothyroxine (SYNTHROID, LEVOTHROID) 25 MCG tablet TAKE 1 TABLET BY MOUTH EVERY DAY 30 tablet 2  . metFORMIN (GLUCOPHAGE) 500 MG tablet Take 1 tablet (500 mg total) by mouth 2 (two) times daily with a meal. 180 tablet 3  . metoprolol (LOPRESSOR) 50 MG tablet TAKE 1 TABLET (50 MG TOTAL) BY MOUTH 2 (TWO) TIMES DAILY. 60 tablet 4  . Multiple Vitamins-Minerals (MULTIVITAMIN WITH MINERALS) tablet Take 1 tablet by mouth daily.    . nicotine (NICODERM CQ - DOSED IN MG/24 HOURS) 21 mg/24hr patch Place 21 mg onto the skin daily.    Marland Kitchen OVER THE COUNTER MEDICATION Take 22.3 mg by mouth daily. ([place])    . PATADAY 0.2 % SOLN Place 1 drop into both eyes daily.   2   No facility-administered  medications prior to visit.         Objective:   Physical Exam Filed Vitals:   11/26/14 1441  BP: 136/64  Pulse: 79  Height: 5' 4.5" (1.638 m)  Weight: 225 lb (102.059 kg)  SpO2: 100%   Gen: Pleasant, overwt, in no distress,  normal affect  ENT: No lesions,  mouth clear,  oropharynx clear, no postnasal drip  Neck: No JVD, no TMG, no carotid bruits  Lungs: No use of accessory muscles, clear without rales or rhonchi  Cardiovascular: RRR, heart sounds normal, no murmur or gallops, no peripheral edema  Musculoskeletal: No deformities, no cyanosis or clubbing  Neuro: alert, non focal  Skin: Warm, no lesions or rashes     Assessment & Plan:  COPD (chronic obstructive pulmonary disease) Pulmonary function testing today does not support obstructive lung disease. Discussed this with her today. We will not start scheduled bronchodilators. The most important thing for [place] to do is to continue to work on her smoking cessation. We discussed strategies for this today in detail  Mediastinal lymphadenopathy By CT scan of the chest. Last scan was in 09/2014 with some slight increase in size compared with 2008. Believe that we can repeat her scan in May 2017 to look for interval change. Depending on the results. May decide to pursue biopsy

## 2014-11-30 ENCOUNTER — Other Ambulatory Visit: Payer: Self-pay | Admitting: Family Medicine

## 2014-12-20 ENCOUNTER — Ambulatory Visit (INDEPENDENT_AMBULATORY_CARE_PROVIDER_SITE_OTHER): Payer: 59 | Admitting: Family Medicine

## 2014-12-20 VITALS — BP 134/78 | HR 74 | Temp 98.4°F | Resp 16 | Ht 64.0 in | Wt 222.6 lb

## 2014-12-20 DIAGNOSIS — E119 Type 2 diabetes mellitus without complications: Secondary | ICD-10-CM

## 2014-12-20 DIAGNOSIS — Z124 Encounter for screening for malignant neoplasm of cervix: Secondary | ICD-10-CM | POA: Diagnosis not present

## 2014-12-20 DIAGNOSIS — Z720 Tobacco use: Secondary | ICD-10-CM

## 2014-12-20 DIAGNOSIS — G4700 Insomnia, unspecified: Secondary | ICD-10-CM | POA: Diagnosis not present

## 2014-12-20 DIAGNOSIS — E663 Overweight: Secondary | ICD-10-CM | POA: Diagnosis not present

## 2014-12-20 DIAGNOSIS — M797 Fibromyalgia: Secondary | ICD-10-CM | POA: Diagnosis not present

## 2014-12-20 DIAGNOSIS — F17200 Nicotine dependence, unspecified, uncomplicated: Secondary | ICD-10-CM

## 2014-12-20 DIAGNOSIS — I10 Essential (primary) hypertension: Secondary | ICD-10-CM | POA: Diagnosis not present

## 2014-12-20 LAB — HEMOGLOBIN A1C
Hgb A1c MFr Bld: 6.3 % — ABNORMAL HIGH (ref <5.7)
Mean Plasma Glucose: 134 mg/dL — ABNORMAL HIGH (ref <117)

## 2014-12-20 LAB — MICROALBUMIN, URINE: Microalb, Ur: <0.2 mg/dL (ref <2.0)

## 2014-12-20 MED ORDER — ALPRAZOLAM 0.5 MG PO TABS
0.5000 mg | ORAL_TABLET | Freq: Every evening | ORAL | Status: DC | PRN
Start: 2014-12-20 — End: 2015-07-29

## 2014-12-20 MED ORDER — METOPROLOL TARTRATE 50 MG PO TABS: ORAL_TABLET | ORAL | Status: DC

## 2014-12-20 MED ORDER — DULOXETINE HCL 30 MG PO CPEP: 90.0000 mg | ORAL_CAPSULE | Freq: Every day | ORAL | Status: DC

## 2014-12-20 MED ORDER — CYCLOBENZAPRINE HCL 10 MG PO TABS: ORAL_TABLET | ORAL | Status: DC

## 2014-12-20 MED ORDER — METFORMIN HCL 500 MG PO TABS: 500.0000 mg | ORAL_TABLET | Freq: Two times a day (BID) | ORAL | Status: DC

## 2014-12-20 MED ORDER — HYDROCODONE-ACETAMINOPHEN 5-325 MG PO TABS
1.0000 | ORAL_TABLET | Freq: Three times a day (TID) | ORAL | Status: DC | PRN
Start: 2014-12-20 — End: 2015-07-28

## 2014-12-20 NOTE — Progress Notes (Signed)
[facility] [address] [place] [address] [phone]  Date:  12/20/2014   Name:  Susan [name]   DOB:  14-Jul-1950   MRN:  [id number]  PCP:  Lamar Blinks, MD    Chief Complaint: Follow-up; Diabetes; and Hypertension   History of Present Illness:  Susan [name] is a 64 y.o. very pleasant female patient who presents with the following:  Here today for follow-up of her DM and HTN- newly dx Dm in May of this year.  Started on metformin then.  She has lost weight.  5/25- 233 lbs.  She has started some health supplements, and has watched her diet.  She is not exercising much yet because it has been so hot outdoors.  She did see pulmonology and they will do a repeat CT scan in the future, but all thought to be benign  She is cutting down on tobacco- she does wish to quit soon Cholesterol panel in May- looked ok, HDL a little bit low.  She was started on atorvastatin but had to stop it due to pain in her legs.    She did have a pap in 2014, but it was not satisfactory- we can repeat today.  However she is s/t hysterectomy in approx 1992 for benign fibroids; no history of abnormal that she is aware of Also she has noted a little bit of a cyst on her labia for about one year, it is hard and not painful.  She is just curious about it.  She does need refills of her hydrocodone/ xanax/ flexeril  She has peripheral neuropathy, arthritis, "pinched nerves in my spine" and fibromyalgia   She may use hydrocodone on occasion- her last fill was in February of this year. She uses it for back pain, muscle pain, and "fibromyalgia related pain." She may take it 2-3x a week. She would like to wean off of this medication if possible  She takes her xanax 0.25- 2 of these- at bedtime as needed    Wt Readings from Last 3 Encounters:  12/20/14 222 lb 9.6 oz (100.971 kg)  11/26/14 225 lb (102.059 kg)  10/07/14 230 lb (104.327 kg)    Lab Results  Component Value Date    HGBA1C 6.6* 09/24/2014    BP Readings from Last 3 Encounters:  12/20/14 134/78  11/26/14 136/64  10/07/14 112/62    Patient Active Problem List   Diagnosis Date Noted  . COPD (chronic obstructive pulmonary disease) 10/07/2014  . Mediastinal lymphadenopathy 10/07/2014  . Diabetes mellitus type 2, controlled 09/29/2014  . Pulmonary nodule 09/29/2014  . History of chest pain 09/29/2014  . Essential hypertension 09/29/2014  . Chest pain 09/23/2014  . Hypothyroidism 02/14/2014  . Tobacco abuse-unspec 10/12/2013  . Hip pain, right 10/12/2013  . Cervical cancer screening 03/09/2013  . Seasonal allergies 09/12/2012  . Nasal polyp 08/30/2012  . SOM (serous otitis media) 08/30/2012  . Dysuria 07/20/2012  . Peripheral neuropathy 07/19/2012  . Cervical myofascial pain syndrome 06/08/2012  . Anemia 01/28/2012  . HTN (hypertension) 01/28/2012  . Hyperlipidemia 01/28/2012  . Hyperglycemia 01/28/2012  . Preventative health care 01/28/2012  . Pain of left heel 01/28/2012  . Neck pain 12/30/2011  . Muscle spasm 12/30/2011  . Anxiety and depression   . Osteoarthritis   . Cancer   . GERD (gastroesophageal reflux disease)   . Fibromyalgia   . Sleep apnea   . Obesity   . Osteopenia   . History of blood  clots   . Fatty liver disease, nonalcoholic   . DDD (degenerative disc disease)     Past Medical History  Diagnosis Date  . Sleep apnea     on CPAP  . Osteopenia   . Fatty liver disease, nonalcoholic   . Obesity   . History of blood clots 2004    during cancer treatment  . Anxiety   . Osteoarthritis   . DDD (degenerative disc disease)   . DJD (degenerative joint disease)   . Cancer 2004    ductal carcinoma; lumpectomy  . Depression   . GERD (gastroesophageal reflux disease)   . Fibromyalgia   . Peripheral neuropathy   . Muscle spasm 12/30/2011  . Anemia 01/28/2012  . HTN (hypertension) 01/28/2012  . Hyperlipidemia 01/28/2012  . Hyperglycemia 01/28/2012  . Preventative  health care 01/28/2012  . Pain of left heel 01/28/2012  . Nasal polyp 08/30/2012  . Tobacco abuse-unspec 10/12/2013  . Hip pain, right 10/12/2013  . Hypothyroidism 02/14/2014    Past Surgical History  Procedure Laterality Date  . Breast surgery  2004    lumpectomy  . Joint replacement      right total knee  . Bilateral oophorectomy  01/2003  . Nasal septum surgery    . Carpal tunnel release      right  . Abdominal hysterectomy  1991  . Thyroidectomy, partial  mid 80's    right side    Social History  Substance Use Topics  . Smoking status: Light Tobacco Smoker -- 0.50 packs/day    Types: Cigarettes  . Smokeless tobacco: Never Used     Comment: Pt currently using nicotine patch.  tf,cma  . Alcohol Use: No    Family History  Problem Relation Age of Onset  . Cancer Mother     liver cancer, hep c  . Heart disease Mother     chf  . Hypertension Mother   . Hyperlipidemia Mother   . Osteoporosis Mother   . Cancer Father     lung  . COPD Father   . Cancer Sister     breas at [place], non Hodgkin's Lymphoma  . Heart disease Sister     mvp  . Hypertension Brother   . Benign prostatic hyperplasia Brother   . Arthritis Brother   . Kidney disease Brother   . Other Daughter     Beal's Syndrome  . Arthritis Daughter     Beal's  . Arthritis Son     Beal's connective tissue disease  . Vision loss Maternal Grandmother   . Dementia Maternal Grandmother   . Vision loss Paternal Grandfather   . Hypertension Brother   . Benign prostatic hyperplasia Brother   . Cancer Brother     prostate  . Hypertension Brother   . COPD Brother   . Cancer Brother     prostate  . Hypertension Brother   . Hyperlipidemia Brother   . Cancer Brother     prostate  . Mental illness Brother     schizophrenia  . Cirrhosis Brother     hep c  . Hypertension Sister   . Other Sister     thalessemia, anemia  . Hyperlipidemia Sister   . Gout Sister   . Other Daughter     overweight    Allergies   Allergen Reactions  . Chantix [Varenicline] Other (See Comments)    "Felt like having mental breakdown"  . Ciprofloxacin Other (See Comments)    Muscle tightness, tendon  aching and pain  . Penicillins Swelling  . Tamoxifen Other (See Comments)    Blood clots  . Lyrica [Pregabalin] Swelling  . Neurontin [Gabapentin] Swelling  . Ceclor [Cefaclor] Rash  . Sulfa Antibiotics Rash    Medication list has been reviewed and updated.  Current Outpatient Prescriptions on File Prior to Visit  Medication Sig Dispense Refill  . albuterol (PROVENTIL HFA;VENTOLIN HFA) 108 (90 BASE) MCG/ACT inhaler Inhale 2 puffs into the lungs every 4 (four) hours as needed for wheezing or shortness of breath (cough, shortness of breath or wheezing.). 1 Inhaler 1  . ALPRAZolam (XANAX) 0.5 MG tablet Take 1 tablet (0.5 mg total) by mouth at bedtime as needed for anxiety. 30 tablet 0  . calcium carbonate (OS-CAL) 600 MG TABS Take 1,800 mg by mouth daily.    . cholecalciferol (VITAMIN D) 1000 UNITS tablet Take 1,000 Units by mouth daily.    . cyclobenzaprine (FLEXERIL) 10 MG tablet TAKE 1 TABLET (10 MG TOTAL) BY MOUTH 2 (TWO) TIMES DAILY AS NEEDED FOR MUSCLE SPASMS. 30 tablet 0  . DULoxetine (CYMBALTA) 30 MG capsule TAKE 3 CAPSULES BY MOUTH DAILY 90 capsule 5  . hydrochlorothiazide (HYDRODIURIL) 25 MG tablet TAKE 1 TABLET (25 MG TOTAL) BY MOUTH DAILY. 30 tablet 5  . HYDROcodone-acetaminophen (NORCO/VICODIN) 5-325 MG per tablet Take 1 tablet by mouth every 8 (eight) hours as needed. 30 tablet 0  . levothyroxine (SYNTHROID, LEVOTHROID) 25 MCG tablet TAKE 1 TABLET BY MOUTH EVERY DAY 30 tablet 2  . metFORMIN (GLUCOPHAGE) 500 MG tablet Take 1 tablet (500 mg total) by mouth 2 (two) times daily with a meal. 180 tablet 3  . metoprolol (LOPRESSOR) 50 MG tablet TAKE 1 TABLET (50 MG TOTAL) BY MOUTH 2 (TWO) TIMES DAILY. 60 tablet 4  . Multiple Vitamins-Minerals (MULTIVITAMIN WITH MINERALS) tablet Take 1 tablet by mouth daily.    .  nicotine (NICODERM CQ - DOSED IN MG/24 HOURS) 21 mg/24hr patch Place 21 mg onto the skin daily.     No current facility-administered medications on file prior to visit.    Review of Systems:  As per HPI- otherwise negative.   Physical Examination: Filed Vitals:   12/20/14 1022  BP: 134/78  Pulse: 74  Temp: 98.4 F (36.9 C)  Resp: 16   Filed Vitals:   12/20/14 1022  Height: 5\' 4"  (1.626 m)  Weight: 222 lb 9.6 oz (100.971 kg)   Body mass index is 38.19 kg/(m^2). Ideal Body Weight: Weight in (lb) to have BMI = 25: 145.3  GEN: WDWN, NAD, Non-toxic, A & O x 3. Obese, looks well HEENT: Atraumatic, Normocephalic. Neck supple. No masses, No LAD. Ears and Nose: No external deformity. CV: RRR, No M/G/R. No JVD. No thrill. No extra heart sounds. PULM: CTA B, no wheezes, crackles, rhonchi. No retractions. No resp. distress. No accessory muscle use. ABD: S, NT, ND. No rebound. No HSM. EXTR: No c/c/e NEURO Normal gait.  PSYCH: Normally interactive. Conversant. Not depressed or anxious appearing.  Calm demeanor.  Gu: s/p hysterectomy, no cervix or uterus present.  She has a small sebaceous cyst on the left labia   Assessment and Plan: Diabetes mellitus type 2, controlled - Plan: Microalbumin, urine, Hemoglobin A1c, metFORMIN (GLUCOPHAGE) 500 MG tablet  Screening for cervical cancer - Plan: CANCELED: Pap IG and HPV (high risk) DNA detection  Tobacco use disorder  Overweight  Fibromyalgia muscle pain - Plan: HYDROcodone-acetaminophen (NORCO/VICODIN) 5-325 MG per tablet, DULoxetine (CYMBALTA) 30 MG capsule, cyclobenzaprine (FLEXERIL) 10  MG tablet  Insomnia - Plan: ALPRAZolam (XANAX) 0.5 MG tablet  Essential hypertension - Plan: metoprolol (LOPRESSOR) 50 MG tablet   Await labs but expect that her A1c will be favorable as she has lost weight She will keep it up! Encouraged her to stop smoking- she is thinking about it No pap as she is s/p hysterectomy for [place] causes Refilled  her controlled meds, encouraged her to minimize use  Signed Lamar Blinks, MD

## 2014-12-20 NOTE — Patient Instructions (Addendum)
I will let you know the results of your A1c test- however it looks like you are making great progress! Please do be sure to see your eye doctor soon Start exercising some when you can and continue to work on quitting smoking Use the hydrocodone and xanax as little as you can as these can be habit forming drugs  Please see me in about 4 months- let's do a fasting cholesterol panel now The area of concern appears to be a benign sebaceous cyst.  Let me know if it bothers you or causes pain You do not have a cervix so you do not need to continue pap smears

## 2014-12-22 ENCOUNTER — Telehealth: Payer: Self-pay | Admitting: Family Medicine

## 2014-12-22 NOTE — Telephone Encounter (Signed)
pre visit letter mailed 12/21/14 °

## 2014-12-31 ENCOUNTER — Telehealth: Payer: Self-pay | Admitting: Family Medicine

## 2014-12-31 NOTE — Telephone Encounter (Signed)
Pt is changing providers and canceled cpe 01/11/15. Wanted to notify you.

## 2015-01-02 ENCOUNTER — Other Ambulatory Visit: Payer: Self-pay | Admitting: Family Medicine

## 2015-01-04 ENCOUNTER — Telehealth: Payer: Self-pay | Admitting: Emergency Medicine

## 2015-01-04 ENCOUNTER — Ambulatory Visit: Payer: 59 | Admitting: Interventional Cardiology

## 2015-01-04 NOTE — Telephone Encounter (Signed)
Per QUANTIFERON TB GOLD result note: Notes Recorded by Collene Gobble, MD on 01/01/2015 at 3:09 PM Please call pt and advise after reviewing labwork I'd like to revisit performing bronchoscopy sooner than waiting until after next CT scan. Schedule pt in office at next available appt to discuss further. Thanks. ---  Called spoke with pt. Aware of above. Appt scheduled for 9/21 at 4:15. Nothing further needed

## 2015-01-05 ENCOUNTER — Telehealth: Payer: Self-pay | Admitting: Emergency Medicine

## 2015-01-05 NOTE — Telephone Encounter (Signed)
Spoke with the pt  She is anxious about why all of the sudden RB wants her to come in to discuss bronch  She states that she does not want to wait until his first available 9/21 and wants to come in sooner  Can we overbook? Please advise thanks!

## 2015-01-05 NOTE — Telephone Encounter (Addendum)
Yes, okay to over book. Spoke with pt. She has been scheduled for 01/11/2015 at 9am.

## 2015-01-11 ENCOUNTER — Ambulatory Visit (INDEPENDENT_AMBULATORY_CARE_PROVIDER_SITE_OTHER): Payer: 59 | Admitting: Emergency Medicine

## 2015-01-11 ENCOUNTER — Encounter: Payer: Self-pay | Admitting: Family Medicine

## 2015-01-11 ENCOUNTER — Encounter: Payer: Self-pay | Admitting: Emergency Medicine

## 2015-01-11 DIAGNOSIS — R7611 Nonspecific reaction to tuberculin skin test without active tuberculosis: Secondary | ICD-10-CM | POA: Diagnosis not present

## 2015-01-11 DIAGNOSIS — Z227 Latent tuberculosis: Secondary | ICD-10-CM | POA: Insufficient documentation

## 2015-01-11 MED ORDER — ISONIAZID 300 MG PO TABS: 300.0000 mg | ORAL_TABLET | Freq: Every day | ORAL | Status: DC

## 2015-01-11 NOTE — Progress Notes (Signed)
Subjective:    Patient ID: Susan [name], female    DOB: [birth year], 64 y.o.   MRN: [id number]  HPI 64 year old smoker (40 pack years) with a history of diabetes, left ductal carcinoma in situ treated with lumpectomy and tamoxifen, c/b VTE. Obstructive sleep apnea on CPAP, hypertension. I have reviewed the office notes from Dr. Lorelei Pont. She carries a history of asthma made in the last couple years when she had apparent flares. Has been treated with pred and abx on those occasions. Also started on Advair - stopped it about 10 days ago. She has a daily cough w gray phlegm - better w decreasing. She can exert indefinitely without SOB, works in her yard.   She underwent a CT scan of the chest on 09/23/14 in order to evaluate an episode of chest tightness and dyspnea, tachycardia that seemed to be best relieved by ativan. This study was reviewed by me today. It does not show any evidence of pulmonary embolism or infiltrate. I do not see any peripheral nodular disease. She does have some bilateral hilar and subcarinal lymphadenopathy. On comparison to her prior CT chest from 2008 the nodes are slightly larger. She does have a family hx of B cell lymphoma.  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 She has lived in [place], including [place].   [place] 11/26/14 -- follow-up visit for suspected asthma and a history of lymphadenopathy that was first done fine in 2008 and then re-characterized on CT scan of the chest from 09/23/14. She underwent full pulmonary function testing today which I have reviewed personally. This shows normal airflows without a bronchodilator response, possible restriction as identified by a mildly reduced residual volume and a normal diffusion capacity. Last time we empirically stopped Advair. She has decreased her cigarettes significantly, now at 3-4 cig a day. We discussed tools and techniques today to stop, to help with her quit date. She uses albuterol sometimes to relieve cough  and clear mucous.    ROV 01/11/15 -- follow up visit for hx LAD on CT chest, tobacco use without obstructive lung disease on PFT. She has not missed her Advair. She had a URI about 5 weeks ago, left her with residual cough. She has backtracked on cigarettes - back to 8-10 a day.     Review of Systems  Constitutional: Negative for fever and unexpected weight change.  HENT: Negative for congestion, dental problem, ear pain, nosebleeds, postnasal drip, rhinorrhea, sinus pressure, sneezing, sore throat and trouble swallowing.   Eyes: Negative for redness and itching.  Respiratory: Positive for cough. Negative for chest tightness, shortness of breath and wheezing.   Cardiovascular: Negative for palpitations and leg swelling.  Gastrointestinal: Negative for nausea and vomiting.  Genitourinary: Negative for dysuria.  Musculoskeletal: Negative for joint swelling.  Skin: Negative for rash.  Neurological: Negative for headaches.  Hematological: Does not bruise/bleed easily.  Psychiatric/Behavioral: Negative for dysphoric mood. The patient is not nervous/anxious.   She has worked as a Marine scientist.       Objective:   Physical Exam Filed Vitals:   01/11/15 0853  BP: 172/90  Pulse: 73  Height: 5' 4.5" (1.638 m)  Weight: 225 lb (102.059 kg)  SpO2: 97%   Gen: Pleasant, overwt, in no distress,  normal affect  ENT: No lesions,  mouth clear,  oropharynx clear, no postnasal drip  Neck: No JVD, no TMG, no carotid bruits  Lungs: No use of accessory muscles, clear without rales or rhonchi  Cardiovascular: RRR, heart sounds normal, no murmur or gallops, no peripheral edema  Musculoskeletal: No deformities, no cyanosis or clubbing  Neuro: alert, non focal  Skin: Warm, no lesions or rashes     Assessment & Plan:  TB lung, latent She has a positive quant Gold without any evidence of parenchymal abnormality on her CT scan of the chest from May. She does have lymphadenopathy but do not believe this is  enough to merit bronchoscopy for AFB. Believe it would be most appropriate to treat her for latent TB. We discussed this today and we will start isoniazid monotherapy  COPD (chronic obstructive pulmonary disease) Without current significant evidence for obstruction, most important intervention at this time is to stop smoking. We discussed this in detail today

## 2015-01-11 NOTE — Assessment & Plan Note (Addendum)
She has a positive quant Gold without any evidence of parenchymal abnormality on her CT scan of the chest from May. She does have lymphadenopathy but do not believe this is enough to merit bronchoscopy for AFB. Believe it would be most appropriate to treat her for latent TB. We discussed this today and we will start isoniazid monotherapy

## 2015-01-11 NOTE — Patient Instructions (Signed)
We will work on decreasing cigarettes to 6 cigarettes daily by next visit Please start isoniazid 300 mg daily. We will plan to continue this medication for 6-9 months. If you has side effects from the medication and we could consider changing to a regimen that is twice a week instead of every day Follow with Dr Lamonte Sakai in 3 months or sooner if you have any problems.

## 2015-01-11 NOTE — Assessment & Plan Note (Signed)
Without current significant evidence for obstruction, most important intervention at this time is to stop smoking. We discussed this in detail today

## 2015-01-12 ENCOUNTER — Telehealth: Payer: Self-pay | Admitting: Emergency Medicine

## 2015-01-12 DIAGNOSIS — J449 Chronic obstructive pulmonary disease, unspecified: Secondary | ICD-10-CM

## 2015-01-12 NOTE — Telephone Encounter (Signed)
Please let her know that she is correct, we follow LFT about every 3 months when people are on isoniazid. I did not realize that she has had elevated LFT before, but this means that we should go ahead and check them now at the beginning of the course, insure that isoniazid is a viable treatment choice. Please have her get her LFT done now. Thanks.

## 2015-01-12 NOTE — Telephone Encounter (Signed)
lmtcb for pt.  

## 2015-01-12 NOTE — Telephone Encounter (Signed)
Patient returned call, [phone].

## 2015-01-12 NOTE — Telephone Encounter (Signed)
Called and spoke to pt. Pt stated she was looking more into the medication she was put on for latent TB, isoniazid. Pt stated she has non-alcoholic fatty liver and found that LFTs should be monitored regularly and wanted RB's recs on the matter.   Dr. Lamonte Sakai, please advise. Thanks.

## 2015-01-12 NOTE — Telephone Encounter (Signed)
Called and spoke with pt about Br Byrum's rec Pt voiced understanding of rec and stated that she will check LFT tomorrow morning Ordered placed to check LFTs  Nothing further is needed

## 2015-01-13 ENCOUNTER — Other Ambulatory Visit (INDEPENDENT_AMBULATORY_CARE_PROVIDER_SITE_OTHER): Payer: 59

## 2015-01-13 DIAGNOSIS — J449 Chronic obstructive pulmonary disease, unspecified: Secondary | ICD-10-CM | POA: Diagnosis not present

## 2015-01-13 LAB — HEPATIC FUNCTION PANEL
ALT: 32 U/L (ref 0–35)
AST: 20 U/L (ref 0–37)
Albumin: 4.2 g/dL (ref 3.5–5.2)
Alkaline Phosphatase: 50 U/L (ref 39–117)
BILIRUBIN DIRECT: 0.1 mg/dL (ref 0.0–0.3)
Total Bilirubin: 0.4 mg/dL (ref 0.2–1.2)
Total Protein: 6.8 g/dL (ref 6.0–8.3)

## 2015-01-26 ENCOUNTER — Ambulatory Visit: Payer: 59 | Admitting: Emergency Medicine

## 2015-01-29 ENCOUNTER — Other Ambulatory Visit: Payer: Self-pay | Admitting: Family Medicine

## 2015-01-31 ENCOUNTER — Ambulatory Visit: Payer: 59 | Admitting: Interventional Cardiology

## 2015-02-07 ENCOUNTER — Other Ambulatory Visit: Payer: Self-pay | Admitting: Family Medicine

## 2015-02-13 ENCOUNTER — Other Ambulatory Visit: Payer: Self-pay | Admitting: Family Medicine

## 2015-02-16 ENCOUNTER — Other Ambulatory Visit: Payer: Self-pay | Admitting: Family Medicine

## 2015-02-20 ENCOUNTER — Encounter: Payer: Self-pay | Admitting: Family Medicine

## 2015-02-21 ENCOUNTER — Telehealth: Payer: Self-pay | Admitting: Emergency Medicine

## 2015-02-21 NOTE — Telephone Encounter (Signed)
Called and spoke to pt. Pt is taking Isoniazid. Pt stated for the past 8 days she has been experiencing intense nausea that will last 3-4 hours at a time. Pt denies vomiting or diarrhea. Pt is requesting recs.   RB please advise. Thanks.

## 2015-02-22 NOTE — Telephone Encounter (Signed)
This probably is due to the isoniazid. Please have her stop it now, call us later this week to update on her sx. We may need to change to an alternative treatment.

## 2015-02-22 NOTE — Telephone Encounter (Signed)
Called and spoke to pt. Informed her of the recs per RB. Pt verbalized understanding and states she will call on Thursday and update. Will keep message open till Thursday 10.20.16.

## 2015-02-25 NOTE — Telephone Encounter (Signed)
Pt states that since she stopped the Isoniazid, she has not had any nausea.  Pt wants to know if she needs to start back on the Isoniazid and take medication for nausea with it?   RB - pls advise.

## 2015-02-28 MED ORDER — PROMETHAZINE HCL 12.5 MG PO TABS: 12.5000 mg | ORAL_TABLET | Freq: Four times a day (QID) | ORAL | Status: DC | PRN

## 2015-02-28 NOTE — Telephone Encounter (Signed)
We can try doing that. Most straightforward choice would be Phenergan 12.5mg  q6h prn for nausea. Recommend she take about 30-60 minutes before the INH. Disp #60, RF 3

## 2015-02-28 NOTE — Telephone Encounter (Signed)
Spoke with pt, aware of recs.  rx sent to preferred pharmacy.  Nothing further needed.  

## 2015-03-01 ENCOUNTER — Telehealth: Payer: Self-pay

## 2015-03-01 DIAGNOSIS — E038 Other specified hypothyroidism: Secondary | ICD-10-CM

## 2015-03-01 MED ORDER — LEVOTHYROXINE SODIUM 25 MCG PO TABS: 25.0000 ug | ORAL_TABLET | Freq: Every day | ORAL | Status: DC

## 2015-03-01 NOTE — Telephone Encounter (Signed)
Dr Lorelei Pont    Patient is requesting medication refill on levothyroxine (SYNTHROID, LEVOTHROID) 25 MCG tablet [facility].  Previously prescribed by another provider.     [phone]

## 2015-03-07 ENCOUNTER — Encounter: Payer: Self-pay | Admitting: Family Medicine

## 2015-04-12 ENCOUNTER — Encounter: Payer: Self-pay | Admitting: Emergency Medicine

## 2015-04-12 ENCOUNTER — Ambulatory Visit (INDEPENDENT_AMBULATORY_CARE_PROVIDER_SITE_OTHER): Payer: 59 | Admitting: Emergency Medicine

## 2015-04-12 VITALS — BP 146/82 | HR 97 | Ht 64.25 in | Wt 230.0 lb

## 2015-04-12 DIAGNOSIS — J449 Chronic obstructive pulmonary disease, unspecified: Secondary | ICD-10-CM | POA: Diagnosis not present

## 2015-04-12 DIAGNOSIS — R599 Enlarged lymph nodes, unspecified: Secondary | ICD-10-CM

## 2015-04-12 DIAGNOSIS — R7611 Nonspecific reaction to tuberculin skin test without active tuberculosis: Secondary | ICD-10-CM

## 2015-04-12 DIAGNOSIS — Z720 Tobacco use: Secondary | ICD-10-CM | POA: Diagnosis not present

## 2015-04-12 DIAGNOSIS — Z23 Encounter for immunization: Secondary | ICD-10-CM

## 2015-04-12 DIAGNOSIS — R590 Localized enlarged lymph nodes: Secondary | ICD-10-CM

## 2015-04-12 DIAGNOSIS — Z227 Latent tuberculosis: Secondary | ICD-10-CM

## 2015-04-12 NOTE — Assessment & Plan Note (Signed)
Tolerating isoniazid.She has completed 3 months. We will continue until June 2017.

## 2015-04-12 NOTE — Patient Instructions (Addendum)
We discussed stopping smoking today. We should make a goal of being prepared to set a quit date by our next visit.  Continue isoniazid as you are taking it. We will discuss stopping in June.  Keep albuterol available 2 puffs up to every 4 hours if needed for shortness of breath.  We will repeat your CT scan after you are off of the isoniazid.  Follow with Dr Lamonte Sakai in June 2017

## 2015-04-12 NOTE — Assessment & Plan Note (Signed)
With slight enlargement on her most  Recent CT scan of the chest in May 2016. We will plan to repeat her scan Summer 2017 after off INH

## 2015-04-12 NOTE — Assessment & Plan Note (Signed)
Most of our visit today was dedicated to discussion of smoking, strategies for cessation, techniques and plans for cessation.

## 2015-04-12 NOTE — Assessment & Plan Note (Signed)
Mild obstruction She has not required her albuterol. We will perform a flu shot today. Most of our visit today was dedicated  to discussion about smoking cessation.

## 2015-04-12 NOTE — Progress Notes (Signed)
Subjective:    Patient ID: Susan [name], female    DOB: December 30, 1950, 64 y.o.   MRN: [id number]  HPI 64 year old smoker (40 pack years) with a history of diabetes, left ductal carcinoma in situ treated with lumpectomy and tamoxifen, c/b VTE. Obstructive sleep apnea on CPAP, hypertension. I have reviewed the office notes from Dr. Lorelei Pont. She carries a history of asthma made in the last couple years when she had apparent flares. Has been treated with pred and abx on those occasions. Also started on Advair - stopped it about 10 days ago. She has a daily cough w gray phlegm - better w decreasing. She can exert indefinitely without SOB, works in her yard.   She underwent a CT scan of the chest on 09/23/14 in order to evaluate an episode of chest tightness and dyspnea, tachycardia that seemed to be best relieved by ativan. This study was reviewed by me today. It does not show any evidence of pulmonary embolism or infiltrate. I do not see any peripheral nodular disease. She does have some bilateral hilar and subcarinal lymphadenopathy. On comparison to her prior CT chest from 2008 the nodes are slightly larger. She does have a family hx of B cell lymphoma.  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 She has lived in [place], including [place].   [place] 11/26/14 -- follow-up visit for suspected asthma and a history of lymphadenopathy that was first done fine in 2008 and then re-characterized on CT scan of the chest from 09/23/14. She underwent full pulmonary function testing today which I have reviewed personally. This shows normal airflows without a bronchodilator response, possible restriction as identified by a mildly reduced residual volume and a normal diffusion capacity. Last time we empirically stopped Advair. She has decreased her cigarettes significantly, now at 3-4 cig a day. We discussed tools and techniques today to stop, to help with her quit date. She uses albuterol sometimes to relieve cough  and clear mucous.    [place] 01/11/15 -- follow up visit for hx LAD on CT chest, tobacco use without obstructive lung disease on PFT. She has not missed her Advair. She had a URI about 5 weeks ago, left her with residual cough. She has backtracked on cigarettes - back to 8-10 a day.   ROV 04/12/15 -- follow up visit for mild COPD, lymphadenopathy on CT chest  (last was 09/2014). She also has ben treated for latent TB with isoniazid. She has albuterol available to use prn, has never needed it. Her exercise tolerance is slightly limited. Smokes 8-10 cig a day. Feels otherwise well. She is using nicotine patches sometimes.  We discussed strategies today. She is not quite ready to set quit date.        Review of Systems  Constitutional: Negative for fever and unexpected weight change.  HENT: Negative for congestion, dental problem, ear pain, nosebleeds, postnasal drip, rhinorrhea, sinus pressure, sneezing, sore throat and trouble swallowing.   Eyes: Negative for redness and itching.  Respiratory: Positive for cough. Negative for chest tightness, shortness of breath and wheezing.   Cardiovascular: Negative for palpitations and leg swelling.  Gastrointestinal: Negative for nausea and vomiting.  Genitourinary: Negative for dysuria.  Musculoskeletal: Negative for joint swelling.  Skin: Negative for rash.  Neurological: Negative for headaches.  Hematological: Does not bruise/bleed easily.  Psychiatric/Behavioral: Negative for dysphoric mood. The patient is not nervous/anxious.   She has worked as a Marine scientist.       Objective:  Physical Exam Filed Vitals:   04/12/15 1436  BP: 146/82  Pulse: 97  Height: 5' 4.25" (1.632 m)  Weight: 230 lb (104.327 kg)  SpO2: 99%   Gen: Pleasant, overwt, in no distress,  normal affect  ENT: No lesions,  mouth clear,  oropharynx clear, no postnasal drip  Neck: No JVD, no TMG, no carotid bruits  Lungs: No use of accessory muscles, clear without rales or  rhonchi  Cardiovascular: RRR, heart sounds normal, no murmur or gallops, no peripheral edema  Musculoskeletal: No deformities, no cyanosis or clubbing  Neuro: alert, non focal  Skin: Warm, no lesions or rashes     Assessment & Plan:  Mediastinal lymphadenopathy With slight enlargement on her most  Recent CT scan of the chest in May 2016. We will plan to repeat her scan Summer 2017 after off INH  TB lung, latent Tolerating isoniazid.She has completed 3 months. We will continue until June 2017.   COPD (chronic obstructive pulmonary disease) Mild obstruction She has not required her albuterol. We will perform a flu shot today. Most of our visit today was dedicated  to discussion about smoking cessation.   Tobacco abuse-unspec Most of our visit today was dedicated to discussion of smoking, strategies for cessation, techniques and plans for cessation.

## 2015-04-20 ENCOUNTER — Ambulatory Visit (INDEPENDENT_AMBULATORY_CARE_PROVIDER_SITE_OTHER): Payer: 59 | Admitting: Family Medicine

## 2015-04-20 ENCOUNTER — Encounter: Payer: Self-pay | Admitting: Family Medicine

## 2015-04-20 VITALS — BP 121/74 | HR 66 | Temp 97.9°F | Resp 16 | Ht 64.0 in | Wt 228.0 lb

## 2015-04-20 DIAGNOSIS — E034 Atrophy of thyroid (acquired): Secondary | ICD-10-CM | POA: Diagnosis not present

## 2015-04-20 DIAGNOSIS — I10 Essential (primary) hypertension: Secondary | ICD-10-CM | POA: Diagnosis not present

## 2015-04-20 DIAGNOSIS — E038 Other specified hypothyroidism: Secondary | ICD-10-CM | POA: Diagnosis not present

## 2015-04-20 DIAGNOSIS — E119 Type 2 diabetes mellitus without complications: Secondary | ICD-10-CM | POA: Diagnosis not present

## 2015-04-20 DIAGNOSIS — Z5181 Encounter for therapeutic drug level monitoring: Secondary | ICD-10-CM

## 2015-04-20 LAB — TSH: TSH: 1.146 u[IU]/mL (ref 0.350–4.500)

## 2015-04-20 NOTE — Progress Notes (Signed)
[facility] [address] [place] [address] [phone]  Date:  04/20/2015   Name:  Susan [name]   DOB:  06-Oct-1950   MRN:  [id number]  PCP:  Lamar Blinks, MD    Chief Complaint: Follow-up and Diabetes   History of Present Illness:  Susan [name] is a 64 y.o. very pleasant female patient who presents with the following:  Here today to recheck on her DM. She was dx just earlier this year.  Has been doing well with PO treatment and working on her diet She was dx with latent TB by Dr. Jobe Marker is being treated with isoniazid for 6 months.  They do think that her chest LAD is benign. 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 Garey Ham Readings from Last 3 Encounters:  04/20/15 228 lb (103.42 kg)  04/12/15 230 lb (104.327 kg)  01/11/15 225 lb (102.059 kg)     Lab Results  Component Value Date   HGBA1C 6.3* 12/20/2014     Patient Active Problem List   Diagnosis Date Noted  . TB lung, latent 01/11/2015  . COPD (chronic obstructive pulmonary disease) ([place]) 10/07/2014  . Mediastinal lymphadenopathy 10/07/2014  . Diabetes mellitus type 2, controlled ([place]) 09/29/2014  . Pulmonary nodule 09/29/2014  . History of chest pain 09/29/2014  . Essential hypertension 09/29/2014  . Chest pain 09/23/2014  . Hypothyroidism 02/14/2014  . Tobacco abuse-unspec 10/12/2013  . Hip pain, right 10/12/2013  . Cervical cancer screening 03/09/2013  . Seasonal allergies 09/12/2012  . Nasal polyp 08/30/2012  . SOM (serous otitis media) 08/30/2012  . Dysuria 07/20/2012  . Peripheral neuropathy ([place]) 07/19/2012  . Cervical myofascial pain syndrome 06/08/2012  . Anemia 01/28/2012  . HTN (hypertension) 01/28/2012  . Hyperlipidemia 01/28/2012  . Hyperglycemia 01/28/2012  . Preventative health care 01/28/2012  . Pain of left heel 01/28/2012  .  Neck pain 12/30/2011  . Muscle spasm 12/30/2011  . Anxiety and depression   . Osteoarthritis   . Cancer ([place])   . GERD (gastroesophageal reflux disease)   . Fibromyalgia   . Sleep apnea   . Obesity   . Osteopenia   . History of blood clots   . Fatty liver disease, nonalcoholic   . DDD (degenerative disc disease)     Past Medical History  Diagnosis Date  . Sleep apnea     on CPAP  . Osteopenia   . Fatty liver disease, nonalcoholic   . Obesity   . History of blood clots 2004    during cancer treatment  . Anxiety   . Osteoarthritis   . DDD (degenerative disc disease)   . DJD (degenerative joint disease)   . Cancer [facility]) 2004    ductal carcinoma; lumpectomy  . Depression   . GERD (gastroesophageal reflux disease)   . Fibromyalgia   . Peripheral neuropathy ([place])   . Muscle spasm 12/30/2011  . Anemia 01/28/2012  . HTN (hypertension) 01/28/2012  . Hyperlipidemia 01/28/2012  . Hyperglycemia 01/28/2012  . Preventative health care 01/28/2012  . Pain of left heel 01/28/2012  . Nasal polyp 08/30/2012  . Tobacco abuse-unspec 10/12/2013  . Hip pain, right 10/12/2013  . Hypothyroidism 02/14/2014    Past Surgical History  Procedure Laterality Date  . Breast surgery  2004    lumpectomy  .  Joint replacement      right total knee  . Bilateral oophorectomy  01/2003  . Nasal septum surgery    . Carpal tunnel release      right  . Abdominal hysterectomy  1991  . Thyroidectomy, partial  mid 80's    right side    Social History  Substance Use Topics  . Smoking status: Light Tobacco Smoker -- 0.50 packs/day    Types: Cigarettes  . Smokeless tobacco: Never Used     Comment: Pt currently using nicotine patch.  tf,cma  . Alcohol Use: No    Family History  Problem Relation Age of Onset  . Cancer Mother     liver cancer, hep c  . Heart disease Mother     chf  . Hypertension Mother   . Hyperlipidemia Mother   . Osteoporosis Mother   . Cancer Father     lung  . COPD Father   .  Cancer Sister     breas at [place], non Hodgkin's Lymphoma  . Heart disease Sister     mvp  . Hypertension Brother   . Benign prostatic hyperplasia Brother   . Arthritis Brother   . Kidney disease Brother   . Other Daughter     Beal's Syndrome  . Arthritis Daughter     Beal's  . Arthritis Son     Beal's connective tissue disease  . Vision loss Maternal Grandmother   . Dementia Maternal Grandmother   . Vision loss Paternal Grandfather   . Hypertension Brother   . Benign prostatic hyperplasia Brother   . Cancer Brother     prostate  . Hypertension Brother   . COPD Brother   . Cancer Brother     prostate  . Hypertension Brother   . Hyperlipidemia Brother   . Cancer Brother     prostate  . Mental illness Brother     schizophrenia  . Cirrhosis Brother     hep c  . Hypertension Sister   . Other Sister     thalessemia, anemia  . Hyperlipidemia Sister   . Gout Sister   . Other Daughter     overweight    Allergies  Allergen Reactions  . Chantix [Varenicline] Other (See Comments)    "Felt like having mental breakdown"  . Ciprofloxacin Other (See Comments)    Muscle tightness, tendon aching and pain  . Penicillins Swelling  . Tamoxifen Other (See Comments)    Blood clots  . Lyrica [Pregabalin] Swelling  . Neurontin [Gabapentin] Swelling  . Ceclor [Cefaclor] Rash  . Sulfa Antibiotics Rash    Medication list has been reviewed and updated.  Current Outpatient Prescriptions on File Prior to Visit  Medication Sig Dispense Refill  . albuterol (PROVENTIL HFA;VENTOLIN HFA) 108 (90 BASE) MCG/ACT inhaler Inhale 2 puffs into the lungs every 4 (four) hours as needed for wheezing or shortness of breath (cough, shortness of breath or wheezing.). 1 Inhaler 1  . ALPRAZolam (XANAX) 0.5 MG tablet Take 1 tablet (0.5 mg total) by mouth at bedtime as needed for anxiety. 30 tablet 0  . cholecalciferol (VITAMIN D) 1000 UNITS tablet Take 1,000 Units by mouth daily.    . cyclobenzaprine  (FLEXERIL) 10 MG tablet TAKE 1 TABLET (10 MG TOTAL) BY MOUTH 2 (TWO) TIMES DAILY AS NEEDED FOR MUSCLE SPASMS. 30 tablet 2  . DULoxetine (CYMBALTA) 30 MG capsule Take 3 capsules (90 mg total) by mouth daily. 270 capsule 3  . esomeprazole (NEXIUM) 20 MG  capsule Take 20 mg by mouth daily at 12 noon.    . fenofibrate 160 MG tablet TAKE 1 TABLET BY MOUTH EVERY DAY 30 tablet 2  . hydrochlorothiazide (HYDRODIURIL) 25 MG tablet TAKE 1 TABLET (25 MG TOTAL) BY MOUTH DAILY. 30 tablet 5  . HYDROcodone-acetaminophen (NORCO/VICODIN) 5-325 MG per tablet Take 1 tablet by mouth every 8 (eight) hours as needed. 30 tablet 0  . isoniazid (NYDRAZID) 300 MG tablet Take 1 tablet (300 mg total) by mouth daily. 30 tablet 5  . levothyroxine (SYNTHROID, LEVOTHROID) 25 MCG tablet Take 1 tablet (25 mcg total) by mouth daily. 90 tablet 3  . metFORMIN (GLUCOPHAGE) 500 MG tablet Take 1 tablet (500 mg total) by mouth 2 (two) times daily with a meal. 180 tablet 3  . metoprolol (LOPRESSOR) 50 MG tablet TAKE 1 TABLET (50 MG TOTAL) BY MOUTH 2 (TWO) TIMES DAILY. 180 tablet 3  . Multiple Vitamins-Minerals (MULTIVITAMIN WITH MINERALS) tablet Take 1 tablet by mouth daily.    . nicotine (NICODERM CQ - DOSED IN MG/24 HOURS) 21 mg/24hr patch Place 21 mg onto the skin daily.     No current facility-administered medications on file prior to visit.    Review of Systems:  As per HPI- otherwise negative.   Physical Examination: Filed Vitals:   04/20/15 1130  BP: 121/74  Pulse: 66  Temp: 97.9 F (36.6 C)  Resp: 16   Filed Vitals:   04/20/15 1130  Height: 5\' 4"  (1.626 m)  Weight: 228 lb (103.42 kg)   Body mass index is 39.12 kg/(m^2). Ideal Body Weight: Weight in (lb) to have BMI = 25: 145.3  GEN: WDWN, NAD, Non-toxic, A & O x 3, obese, looks well HEENT: Atraumatic, Normocephalic. Neck supple. No masses, No LAD. Ears and Nose: No external deformity. CV: RRR, No M/G/R. No JVD. No thrill. No extra heart sounds. PULM: CTA B, no  wheezes, crackles, rhonchi. No retractions. No resp. distress. No accessory muscle use. EXTR: No c/c/e NEURO Normal gait.  PSYCH: Normally interactive. Conversant. Not depressed or anxious appearing.  Calm demeanor.    Assessment and Plan: Controlled type 2 diabetes mellitus without complication, without long-term current use of insulin ([place]) - Plan: Comprehensive metabolic panel, Hemoglobin A1c  Medication monitoring encounter - Plan: Comprehensive metabolic panel  Hypothyroidism due to acquired atrophy of thyroid - Plan: TSH  Essential hypertension, benign  Check CMP, TSH, A1c today BP is well controlled Will plan further follow- up pending labs. Plan follow-up in about 6 months   Signed Lamar Blinks, MD

## 2015-04-20 NOTE — Patient Instructions (Signed)
I will be in touch with your labs asap- if possible we can decrease your metfomin dose

## 2015-04-21 LAB — HEMOGLOBIN A1C
HEMOGLOBIN A1C: 6.1 % — AB (ref <5.7)
Mean Plasma Glucose: 128 mg/dL — ABNORMAL HIGH (ref <117)

## 2015-04-21 LAB — COMPREHENSIVE METABOLIC PANEL
ALT: 32 U/L — AB (ref 6–29)
AST: 26 U/L (ref 10–35)
Albumin: 4.3 g/dL (ref 3.6–5.1)
Alkaline Phosphatase: 46 U/L (ref 33–130)
BUN: 17 mg/dL (ref 7–25)
CO2: 28 mmol/L (ref 20–31)
CREATININE: 0.82 mg/dL (ref 0.50–0.99)
Calcium: 9.7 mg/dL (ref 8.6–10.4)
Chloride: 100 mmol/L (ref 98–110)
Glucose, Bld: 88 mg/dL (ref 65–99)
POTASSIUM: 4 mmol/L (ref 3.5–5.3)
Sodium: 137 mmol/L (ref 135–146)
TOTAL PROTEIN: 6.5 g/dL (ref 6.1–8.1)
Total Bilirubin: 0.5 mg/dL (ref 0.2–1.2)

## 2015-05-13 LAB — HM DIABETES EYE EXAM

## 2015-05-18 ENCOUNTER — Other Ambulatory Visit: Payer: Self-pay | Admitting: Family Medicine

## 2015-05-27 ENCOUNTER — Encounter: Payer: Self-pay | Admitting: Family Medicine

## 2015-06-01 ENCOUNTER — Encounter: Payer: Self-pay | Admitting: Family Medicine

## 2015-06-27 ENCOUNTER — Encounter: Payer: Self-pay | Admitting: Family Medicine

## 2015-06-27 DIAGNOSIS — E785 Hyperlipidemia, unspecified: Secondary | ICD-10-CM

## 2015-06-28 MED ORDER — FENOFIBRATE 160 MG PO TABS: 160.0000 mg | ORAL_TABLET | Freq: Every day | ORAL | Status: DC

## 2015-07-04 ENCOUNTER — Other Ambulatory Visit: Payer: Self-pay | Admitting: Family Medicine

## 2015-07-12 ENCOUNTER — Other Ambulatory Visit: Payer: Self-pay | Admitting: Emergency Medicine

## 2015-07-28 ENCOUNTER — Encounter: Payer: Self-pay | Admitting: Family Medicine

## 2015-07-28 ENCOUNTER — Ambulatory Visit (INDEPENDENT_AMBULATORY_CARE_PROVIDER_SITE_OTHER): Payer: 59 | Admitting: Family Medicine

## 2015-07-28 VITALS — BP 135/79 | HR 76 | Temp 98.2°F | Resp 16 | Ht 64.0 in | Wt 232.0 lb

## 2015-07-28 DIAGNOSIS — I10 Essential (primary) hypertension: Secondary | ICD-10-CM | POA: Diagnosis not present

## 2015-07-28 DIAGNOSIS — Z1239 Encounter for other screening for malignant neoplasm of breast: Secondary | ICD-10-CM

## 2015-07-28 DIAGNOSIS — E119 Type 2 diabetes mellitus without complications: Secondary | ICD-10-CM

## 2015-07-28 DIAGNOSIS — E039 Hypothyroidism, unspecified: Secondary | ICD-10-CM | POA: Diagnosis not present

## 2015-07-28 DIAGNOSIS — Z13820 Encounter for screening for osteoporosis: Secondary | ICD-10-CM

## 2015-07-28 DIAGNOSIS — K760 Fatty (change of) liver, not elsewhere classified: Secondary | ICD-10-CM | POA: Diagnosis not present

## 2015-07-28 DIAGNOSIS — B353 Tinea pedis: Secondary | ICD-10-CM | POA: Diagnosis not present

## 2015-07-28 DIAGNOSIS — Z780 Asymptomatic menopausal state: Secondary | ICD-10-CM

## 2015-07-28 LAB — COMPREHENSIVE METABOLIC PANEL WITH GFR
ALT: 42 U/L — ABNORMAL HIGH (ref 6–29)
AST: 26 U/L (ref 10–35)
Albumin: 4 g/dL (ref 3.6–5.1)
Alkaline Phosphatase: 44 U/L (ref 33–130)
BUN: 17 mg/dL (ref 7–25)
CO2: 26 mmol/L (ref 20–31)
Calcium: 9.4 mg/dL (ref 8.6–10.4)
Chloride: 102 mmol/L (ref 98–110)
Creat: 0.75 mg/dL (ref 0.50–0.99)
Glucose, Bld: 133 mg/dL — ABNORMAL HIGH (ref 65–99)
Potassium: 4 mmol/L (ref 3.5–5.3)
Sodium: 139 mmol/L (ref 135–146)
Total Bilirubin: 0.3 mg/dL (ref 0.2–1.2)
Total Protein: 6.6 g/dL (ref 6.1–8.1)

## 2015-07-28 LAB — TSH: TSH: 0.84 m[IU]/L

## 2015-07-28 LAB — POCT GLYCOSYLATED HEMOGLOBIN (HGB A1C): Hemoglobin A1C: 6.1

## 2015-07-28 MED ORDER — ADULT BLOOD PRESSURE CUFF LG KIT: PACK | Status: DC

## 2015-07-28 MED ORDER — HYDROCHLOROTHIAZIDE 25 MG PO TABS: ORAL_TABLET | ORAL | Status: DC

## 2015-07-28 MED ORDER — CYCLOBENZAPRINE HCL 10 MG PO TABS: 10.0000 mg | ORAL_TABLET | Freq: Three times a day (TID) | ORAL | Status: DC | PRN

## 2015-07-28 MED ORDER — TEMAZEPAM 7.5 MG PO CAPS: 7.5000 mg | ORAL_CAPSULE | Freq: Every evening | ORAL | Status: DC | PRN

## 2015-07-28 NOTE — Progress Notes (Signed)
Subjective:    Patient ID: Susan [name], female    DOB: 01/12/1951, 65 y.o.   MRN: [id number] Chief Complaint  Patient presents with  . Establish Care  . 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  She will complete her 9 mos of treatment in June.  She had an abnml chest CT 09/2014 which showed enlarged lymph nodes which had progressed since slight enlargement on chest CT 8 yrs prior.  The CT was initially done after a severe anxiety attack to r/o PE.  She was exposed to active TB as a child and did have a "false-positive" TB test many years prior.  Past Medical History  Diagnosis Date  . Sleep apnea     on CPAP  . Osteopenia   . Fatty liver disease, nonalcoholic   . Obesity   . History of blood clots 2004    during cancer treatment  . Anxiety   . Osteoarthritis   . DDD (degenerative disc disease)   . DJD (degenerative joint disease)   . Cancer [facility]) 2004    ductal carcinoma; lumpectomy  . Depression   . GERD (gastroesophageal reflux disease)   . Fibromyalgia   . Peripheral neuropathy ([place])   . Muscle spasm 12/30/2011  . Anemia 01/28/2012  . HTN (hypertension) 01/28/2012  . Hyperlipidemia 01/28/2012  . Hyperglycemia 01/28/2012  . Preventative health care 01/28/2012  . Pain of left heel 01/28/2012  . Nasal polyp 08/30/2012  . Tobacco abuse-unspec 10/12/2013  . Hip pain, right 10/12/2013  . Hypothyroidism 02/14/2014   Past Surgical History  Procedure Laterality Date  . Breast surgery  2004    lumpectomy  . Joint replacement      right total knee  . Bilateral oophorectomy  01/2003  . Nasal septum surgery    . Carpal tunnel release      right  . Abdominal hysterectomy  1991  . Thyroidectomy, partial  mid 80's    right side   Current Outpatient Prescriptions on File Prior to Visit  Medication Sig Dispense Refill  . albuterol (PROVENTIL HFA;VENTOLIN HFA) 108 (90 BASE)  MCG/ACT inhaler Inhale 2 puffs into the lungs every 4 (four) hours as needed for wheezing or shortness of breath (cough, shortness of breath or wheezing.). 1 Inhaler 1  . cholecalciferol (VITAMIN D) 1000 UNITS tablet Take 1,000 Units by mouth daily.    . DULoxetine (CYMBALTA) 30 MG capsule Take 3 capsules (90 mg total) by mouth daily. 270 capsule 3  . esomeprazole (NEXIUM) 20 MG capsule Take 20 mg by mouth daily at 12 noon.    . fenofibrate 160 MG tablet Take 1 tablet (160 mg total) by mouth daily. 90 tablet 3  . isoniazid (NYDRAZID) 300 MG tablet TAKE 1 TABLET (300 MG TOTAL) BY MOUTH DAILY. 30 tablet 5  . levothyroxine (SYNTHROID, LEVOTHROID) 25 MCG tablet Take 1 tablet (25 mcg total) by mouth daily. 90 tablet 3  . metFORMIN (GLUCOPHAGE) 500 MG tablet Take 1 tablet (500 mg total) by mouth 2 (two) times daily with a meal. 180 tablet 3  . metoprolol (LOPRESSOR) 50 MG tablet TAKE 1 TABLET (50 MG TOTAL) BY MOUTH 2 (TWO) TIMES DAILY. 180 tablet 3  . Multiple Vitamins-Minerals (MULTIVITAMIN WITH MINERALS) tablet Take 1 tablet by mouth daily.     No current facility-administered medications on file prior to visit.  Allergies  Allergen Reactions  . Chantix [Varenicline] Other (See Comments)    "Felt like having mental breakdown"  . Ciprofloxacin Other (See Comments)    Muscle tightness, tendon aching and pain  . Penicillins Swelling  . Tamoxifen Other (See Comments)    Blood clots  . Lyrica [Pregabalin] Swelling  . Neurontin [Gabapentin] Swelling  . Ceclor [Cefaclor] Rash  . Sulfa Antibiotics Rash   Family History  Problem Relation Age of Onset  . Cancer Mother     liver cancer, hep c  . Heart disease Mother     chf  . Hypertension Mother   . Hyperlipidemia Mother   . Osteoporosis Mother   . Cancer Father     lung  . COPD Father   . Cancer Sister     breas at [place], non Hodgkin's Lymphoma  . Heart disease Sister     mvp  . Hypertension Brother   . Benign prostatic hyperplasia  Brother   . Arthritis Brother   . Kidney disease Brother   . Other Daughter     Beal's Syndrome  . Arthritis Daughter     Beal's  . Arthritis Son     Beal's connective tissue disease  . Vision loss Maternal Grandmother   . Dementia Maternal Grandmother   . Vision loss Paternal Grandfather   . Hypertension Brother   . Benign prostatic hyperplasia Brother   . Cancer Brother     prostate  . Hypertension Brother   . COPD Brother   . Cancer Brother     prostate  . Hypertension Brother   . Hyperlipidemia Brother   . Cancer Brother     prostate  . Mental illness Brother     schizophrenia  . Cirrhosis Brother     hep c  . Hypertension Sister   . Other Sister     thalessemia, anemia  . Hyperlipidemia Sister   . Gout Sister   . Other Daughter     overweight   Social History   Social History  . Marital Status: Married    Spouse Name: N/A  . Number of Children: N/A  . Years of Education: N/A   Social History Main Topics  . Smoking status: Light Tobacco Smoker -- 0.50 packs/day    Types: Cigarettes  . Smokeless tobacco: Never Used     Comment: Pt currently using nicotine patch.  tf,cma  . Alcohol Use: No  . Drug Use: No  . Sexual Activity: No   Other Topics Concern  . None   Social History Narrative    Review of Systems  Constitutional: Positive for fatigue. Negative for fever, chills, activity change and appetite change.  Respiratory: Negative for cough, chest tightness, shortness of breath and wheezing.   Cardiovascular: Negative for chest pain and palpitations.  Endocrine: Positive for cold intolerance (feet). Negative for polydipsia and polyuria.  Musculoskeletal: Positive for myalgias and arthralgias. Negative for joint swelling and gait problem.  Neurological: Positive for numbness (large toe tingling). Negative for weakness.  Psychiatric/Behavioral: Positive for sleep disturbance. The patient is nervous/anxious.        Objective:  BP 135/79 mmHg   Pulse 76  Temp(Src) 98.2 F (36.8 C)  Resp 16  Ht '5\' 4"'  (1.626 m)  Wt 232 lb (105.235 kg)  BMI 39.80 kg/m2  Physical Exam  Constitutional: She is oriented to person, place, and time. She appears well-developed and well-nourished. No distress.  HENT:  Head: Normocephalic and atraumatic.  Right Ear: External  ear normal.  Left Ear: External ear normal.  Eyes: Conjunctivae are normal. No scleral icterus.  Neck: Normal range of motion. Neck supple. No thyromegaly present.  Cardiovascular: Normal rate, regular rhythm, normal heart sounds and intact distal pulses.   Pulmonary/Chest: Effort normal and breath sounds normal. No respiratory distress.  Musculoskeletal: She exhibits no edema.  Lymphadenopathy:    She has no cervical adenopathy.  Neurological: She is alert and oriented to person, place, and time.  Skin: Skin is warm and dry. She is not diaphoretic. No erythema.  Psychiatric: She has a normal mood and affect. Her behavior is normal.          Assessment & Plan:  Prefers all rxs dispense in 30d increments with refills  Refill levothyroxine, fenofibrate after labs come back  1. Controlled type 2 diabetes mellitus without complication, without long-term current use of insulin ([place]) - well controlled on metformin 500 bid - pt aware that she could try to go off of it, but no side effects [name] ok with continuing on it since hard to exercise with FM but is going to start water walking at [employer].  2. Hypothyroidism, unspecified hypothyroidism type   3. Essential hypertension - has been borderline [name] pt going to purchase cuff to monitor outside office  4. Fatty liver disease, nonalcoholic   5. Screening for breast cancer   6. Screening for osteoporosis   7. Postmenopausal estrogen deficiency - due for screeningdexa  8. Tinea pedis of both feet - try top naftin.  9.       HPL - normal lipid panel 10 mos prior - has been really well controlled just on fenofibrate 10.    Insomnia -  responded better to restoril yrs prev [name] req to try this qhs instead of xanax. Trying lowest dose as adding in sched flexeril qhs after stable on restoril for several wks. 11.     Fibromyalgia - has been on cymbalta 90 for several years - uses flexeril prn - varies but an avg 1x/d.  Requests to stop receiving chronic hydrocodone due to risks of tolerance/depedence. Start adding in flexeril sched qhs after stable on low dose of restoril  Orders Placed This Encounter  Procedures  . MM Digital Screening    Standing Status: Future     Number of Occurrences:      Standing Expiration Date: 09/26/2016    Order Specific Question:  Reason for Exam (SYMPTOM  OR DIAGNOSIS REQUIRED)    Answer:  screening    Order Specific Question:  Preferred imaging location?    Answer:  [facility]  . DG Bone Density    Standing Status: Future     Number of Occurrences:      Standing Expiration Date: 09/26/2016    Order Specific Question:  Reason for Exam (SYMPTOM  OR DIAGNOSIS REQUIRED)    Answer:  screening for osteoporosis    Order Specific Question:  Preferred imaging location?    Answer:  [facility]  . Comprehensive metabolic panel  . TSH  . POCT glycosylated hemoglobin (Hb A1C)    Meds ordered this encounter  Medications  . Blood Pressure Monitoring (ADULT BLOOD PRESSURE CUFF LG) KIT    Sig: Use as directed    Dispense:  1 each    Refill:  0  . cyclobenzaprine (FLEXERIL) 10 MG tablet    Sig: Take 1 tablet (10 mg total) by mouth 3 (three) times daily as needed for muscle spasms.    Dispense:  60 tablet    Refill:  5  . hydrochlorothiazide (HYDRODIURIL) 25 MG tablet    Sig: TAKE 1 TABLET (25 MG TOTAL) BY MOUTH DAILY.    Dispense:  30 tablet    Refill:  5  . temazepam (RESTORIL) 7.5 MG capsule    Sig: Take 1 capsule (7.5 mg total) by mouth at bedtime as needed for sleep.    Dispense:  30 capsule    Refill:  5    [name], MD MPH

## 2015-07-28 NOTE — Patient Instructions (Signed)
Managing Your High Blood Pressure Blood pressure is a measurement of how forceful your blood is pressing against the walls of the arteries. Arteries are muscular tubes within the circulatory system. Blood pressure does not stay the same. Blood pressure rises when you are active, excited, or nervous; and it lowers during sleep and relaxation. If the numbers measuring your blood pressure stay above normal most of the time, you are at risk for health problems. High blood pressure (hypertension) is a long-term (chronic) condition in which blood pressure is elevated. A blood pressure reading is recorded as two numbers, such as 120 over 80 (or 120/80). The first, higher number is called the systolic pressure. It is a measure of the pressure in your arteries as the heart beats. The second, lower number is called the diastolic pressure. It is a measure of the pressure in your arteries as the heart relaxes between beats.  Keeping your blood pressure in a normal range is important to your overall health and prevention of health problems, such as heart disease and stroke. When your blood pressure is uncontrolled, your heart has to work harder than normal. High blood pressure is a very common condition in adults because blood pressure tends to rise with age. Men and women are equally likely to have hypertension but at different times in life. Before age 45, men are more likely to have hypertension. After 65 years of age, women are more likely to have it. Hypertension is especially common in African Americans. This condition often has no signs or symptoms. The cause of the condition is usually not known. Your caregiver can help you come up with a plan to keep your blood pressure in a normal, healthy range. BLOOD PRESSURE STAGES Blood pressure is classified into four stages: normal, prehypertension, stage 1, and stage 2. Your blood pressure reading will be used to determine what type of treatment, if any, is necessary.  Appropriate treatment options are tied to these four stages:  Normal  Systolic pressure (mm Hg): below 120.  Diastolic pressure (mm Hg): below 80. Prehypertension  Systolic pressure (mm Hg): 120 to 139.  Diastolic pressure (mm Hg): 80 to 89. Stage1  Systolic pressure (mm Hg): 140 to 159.  Diastolic pressure (mm Hg): 90 to 99. Stage2  Systolic pressure (mm Hg): 160 or above.  Diastolic pressure (mm Hg): 100 or above. RISKS RELATED TO HIGH BLOOD PRESSURE Managing your blood pressure is an important responsibility. Uncontrolled high blood pressure can lead to:  A heart attack.  A stroke.  A weakened blood vessel (aneurysm).  Heart failure.  Kidney damage.  Eye damage.  Metabolic syndrome.  Memory and concentration problems. HOW TO MANAGE YOUR BLOOD PRESSURE Blood pressure can be managed effectively with lifestyle changes and medicines (if needed). Your caregiver will help you come up with a plan to bring your blood pressure within a normal range. Your plan should include the following: Education  Read all information provided by your caregivers about how to control blood pressure.  Educate yourself on the latest guidelines and treatment recommendations. New research is always being done to further define the risks and treatments for high blood pressure. Lifestylechanges  Control your weight.  Avoid smoking.  Stay physically active.  Reduce the amount of salt in your diet.  Reduce stress.  Control any chronic conditions, such as high cholesterol or diabetes.  Reduce your alcohol intake. Medicines  Several medicines (antihypertensive medicines) are available, if needed, to bring blood pressure within a normal range.  Communication °· Review all the medicines you take with your caregiver because there may be side effects or interactions. °· Talk with your caregiver about your diet, exercise habits, and other lifestyle factors that may be contributing to  high blood pressure. °· See your caregiver regularly. Your caregiver can help you create and adjust your plan for managing high blood pressure. °RECOMMENDATIONS FOR TREATMENT AND FOLLOW-UP  °The following recommendations are based on current guidelines for managing high blood pressure in nonpregnant adults. Use these recommendations to identify the proper follow-up period or treatment option based on your blood pressure reading. You can discuss these options with your caregiver. °· Systolic pressure of 120 to 139 or diastolic pressure of 80 to 89: Follow up with your caregiver as directed. °· Systolic pressure of 140 to 160 or diastolic pressure of 90 to 100: Follow up with your caregiver within 2 months. °· Systolic pressure above 160 or diastolic pressure above 100: Follow up with your caregiver within 1 month. °· Systolic pressure above 180 or diastolic pressure above 110: Consider antihypertensive therapy; follow up with your caregiver within 1 week. °· Systolic pressure above 200 or diastolic pressure above 120: Begin antihypertensive therapy; follow up with your caregiver within 1 week. °  °This information is not intended to replace advice given to you by your health care provider. Make sure you discuss any questions you have with your health care provider. °  °Document Released: 01/16/2012 Document Reviewed: 01/16/2012 °Elsevier Interactive Patient Education ©2016 [employer] ° °

## 2015-07-29 MED ORDER — NAFTIFINE HCL 2 % EX CREA: 1.0000 "application " | TOPICAL_CREAM | Freq: Every day | CUTANEOUS | Status: DC

## 2015-08-02 ENCOUNTER — Other Ambulatory Visit: Payer: Self-pay

## 2015-08-02 DIAGNOSIS — E2839 Other primary ovarian failure: Secondary | ICD-10-CM

## 2015-08-03 ENCOUNTER — Other Ambulatory Visit: Payer: Self-pay | Admitting: Family Medicine

## 2015-08-03 DIAGNOSIS — N6489 Other specified disorders of breast: Secondary | ICD-10-CM

## 2015-08-18 ENCOUNTER — Ambulatory Visit
Admission: RE | Admit: 2015-08-18 | Discharge: 2015-08-18 | Disposition: A | Discharge status: Home / Self Care | Payer: 59 | Source: Ambulatory Visit | Attending: Family Medicine | Admitting: Family Medicine

## 2015-08-18 ENCOUNTER — Other Ambulatory Visit: Payer: Self-pay | Admitting: Family Medicine

## 2015-08-18 DIAGNOSIS — N6489 Other specified disorders of breast: Secondary | ICD-10-CM

## 2015-08-18 DIAGNOSIS — E2839 Other primary ovarian failure: Secondary | ICD-10-CM

## 2015-08-18 DIAGNOSIS — R921 Mammographic calcification found on diagnostic imaging of breast: Secondary | ICD-10-CM

## 2015-08-24 ENCOUNTER — Encounter: Payer: Self-pay | Admitting: Family Medicine

## 2015-08-26 ENCOUNTER — Ambulatory Visit
Admission: RE | Admit: 2015-08-26 | Discharge: 2015-08-26 | Disposition: A | Discharge status: Home / Self Care | Payer: 59 | Source: Ambulatory Visit | Attending: Family Medicine | Admitting: Family Medicine

## 2015-08-26 ENCOUNTER — Other Ambulatory Visit: Payer: Self-pay | Admitting: Family Medicine

## 2015-08-26 DIAGNOSIS — R921 Mammographic calcification found on diagnostic imaging of breast: Secondary | ICD-10-CM

## 2015-08-26 DIAGNOSIS — Z712 Person consulting for explanation of examination or test findings: Secondary | ICD-10-CM

## 2015-08-29 ENCOUNTER — Ambulatory Visit
Admission: RE | Admit: 2015-08-29 | Discharge: 2015-08-29 | Disposition: A | Discharge status: Home / Self Care | Payer: 59 | Source: Ambulatory Visit | Attending: Family Medicine | Admitting: Family Medicine

## 2015-08-29 DIAGNOSIS — Z712 Person consulting for explanation of examination or test findings: Secondary | ICD-10-CM

## 2015-08-30 ENCOUNTER — Telehealth: Payer: Self-pay | Admitting: Emergency Medicine

## 2015-08-30 NOTE — Telephone Encounter (Signed)
Spoke with pt. She has been diagnosed with advanced stage breast cancer. Will more than likely being having a double mastectomy. She is wanting to know if she should have a CT of her chest. Sees her surgeon on Thursday.  RB - please advise. Thanks.

## 2015-08-30 NOTE — Telephone Encounter (Signed)
Per RB >> we will wait on imaging from our end. Her oncologist should be do a PET scan, if this is done we will not need to do further imaging.  Pt is aware of the above information.  She states that she has not had a PET scan yet. Pt will discuss this with her oncologist and if a PET is done she will make sure we receive the results. Nothing further was needed at this time.

## 2015-09-01 ENCOUNTER — Other Ambulatory Visit: Payer: Self-pay | Admitting: General Surgery

## 2015-09-07 ENCOUNTER — Other Ambulatory Visit: Payer: Self-pay

## 2015-09-07 ENCOUNTER — Encounter: Payer: Self-pay | Admitting: Family Medicine

## 2015-09-07 DIAGNOSIS — M797 Fibromyalgia: Secondary | ICD-10-CM

## 2015-09-07 DIAGNOSIS — E038 Other specified hypothyroidism: Secondary | ICD-10-CM

## 2015-09-07 MED ORDER — METFORMIN HCL 500 MG PO TABS: 500.0000 mg | ORAL_TABLET | Freq: Two times a day (BID) | ORAL | Status: DC

## 2015-09-07 MED ORDER — LEVOTHYROXINE SODIUM 25 MCG PO TABS
25.0000 ug | ORAL_TABLET | Freq: Every day | ORAL | Status: DC
Start: 2015-09-07 — End: 2016-03-08

## 2015-09-07 MED ORDER — DULOXETINE HCL 30 MG PO CPEP: 90.0000 mg | ORAL_CAPSULE | Freq: Every day | ORAL | Status: DC

## 2015-09-07 NOTE — Telephone Encounter (Signed)
See notes under pt email also

## 2015-09-07 NOTE — Telephone Encounter (Signed)
Dr Brigitte Pulse, I took care of RFs, but wanted to forward so you can see pt's message about her upcoming surgery.

## 2015-09-12 ENCOUNTER — Telehealth: Payer: Self-pay

## 2015-09-12 NOTE — Telephone Encounter (Signed)
Pt called this morning and left voice message that Dr Dalbert Batman will be referring her to Dr Marko Plume for new R breast DCIS. She was asking if [facility] had received breast prognostic profile and if [facility] had received hormone receptor analysis. S/w Dr Marko Plume and she is not taking breast cancer patients now. Left 2 voice messages with HIM re: the referral and Dr Marko Plume not taking any new breast patients. Called the pt back and LVM with the above information and that Dr Jana Hakim or Dr Lindi Adie would be able to provide care for her here at [facility].

## 2015-09-14 ENCOUNTER — Other Ambulatory Visit: Payer: Self-pay | Admitting: *Deleted

## 2015-09-14 DIAGNOSIS — C801 Malignant (primary) neoplasm, unspecified: Secondary | ICD-10-CM

## 2015-09-15 ENCOUNTER — Encounter: Payer: Self-pay | Admitting: Hematology and Oncology

## 2015-09-15 ENCOUNTER — Ambulatory Visit (HOSPITAL_BASED_OUTPATIENT_CLINIC_OR_DEPARTMENT_OTHER): Payer: 59 | Admitting: Hematology and Oncology

## 2015-09-15 ENCOUNTER — Other Ambulatory Visit (HOSPITAL_BASED_OUTPATIENT_CLINIC_OR_DEPARTMENT_OTHER): Payer: 59

## 2015-09-15 ENCOUNTER — Ambulatory Visit: Payer: 59 | Admitting: Genetic Counselor

## 2015-09-15 ENCOUNTER — Encounter: Payer: Self-pay | Admitting: Genetic Counselor

## 2015-09-15 VITALS — BP 130/66 | HR 73 | Temp 97.5°F | Resp 20 | Ht 64.0 in | Wt 237.8 lb

## 2015-09-15 DIAGNOSIS — Z803 Family history of malignant neoplasm of breast: Secondary | ICD-10-CM | POA: Insufficient documentation

## 2015-09-15 DIAGNOSIS — D0511 Intraductal carcinoma in situ of right breast: Secondary | ICD-10-CM

## 2015-09-15 DIAGNOSIS — Z800 Family history of malignant neoplasm of digestive organs: Secondary | ICD-10-CM

## 2015-09-15 DIAGNOSIS — C801 Malignant (primary) neoplasm, unspecified: Secondary | ICD-10-CM

## 2015-09-15 DIAGNOSIS — Z806 Family history of leukemia: Secondary | ICD-10-CM

## 2015-09-15 DIAGNOSIS — C50411 Malignant neoplasm of upper-outer quadrant of right female breast: Secondary | ICD-10-CM

## 2015-09-15 DIAGNOSIS — Z853 Personal history of malignant neoplasm of breast: Secondary | ICD-10-CM

## 2015-09-15 DIAGNOSIS — Z8042 Family history of malignant neoplasm of prostate: Secondary | ICD-10-CM

## 2015-09-15 DIAGNOSIS — Z809 Family history of malignant neoplasm, unspecified: Secondary | ICD-10-CM

## 2015-09-15 DIAGNOSIS — C50911 Malignant neoplasm of unspecified site of right female breast: Secondary | ICD-10-CM

## 2015-09-15 DIAGNOSIS — Z808 Family history of malignant neoplasm of other organs or systems: Secondary | ICD-10-CM

## 2015-09-15 DIAGNOSIS — Z801 Family history of malignant neoplasm of trachea, bronchus and lung: Secondary | ICD-10-CM

## 2015-09-15 DIAGNOSIS — Z807 Family history of other malignant neoplasms of lymphoid, hematopoietic and related tissues: Secondary | ICD-10-CM

## 2015-09-15 LAB — COMPREHENSIVE METABOLIC PANEL
ALT: 53 U/L (ref 0–55)
ANION GAP: 8 meq/L (ref 3–11)
AST: 35 U/L — AB (ref 5–34)
Albumin: 4.1 g/dL (ref 3.5–5.0)
Alkaline Phosphatase: 60 U/L (ref 40–150)
BILIRUBIN TOTAL: 0.31 mg/dL (ref 0.20–1.20)
BUN: 15.5 mg/dL (ref 7.0–26.0)
CHLORIDE: 103 meq/L (ref 98–109)
CO2: 30 meq/L — AB (ref 22–29)
CREATININE: 0.9 mg/dL (ref 0.6–1.1)
Calcium: 9.8 mg/dL (ref 8.4–10.4)
EGFR: 72 mL/min/{1.73_m2} — ABNORMAL LOW (ref >90)
GLUCOSE: 88 mg/dL (ref 70–140)
Potassium: 4 mEq/L (ref 3.5–5.1)
Sodium: 141 mEq/L (ref 136–145)
TOTAL PROTEIN: 7 g/dL (ref 6.4–8.3)

## 2015-09-15 LAB — CBC WITH DIFFERENTIAL/PLATELET
BASO%: 0.2 % (ref 0.0–2.0)
Basophils Absolute: 0 10*3/uL (ref 0.0–0.1)
EOS%: 0.5 % (ref 0.0–7.0)
Eosinophils Absolute: 0 10*3/uL (ref 0.0–0.5)
HEMATOCRIT: 36.9 % (ref 34.8–46.6)
HGB: 13.1 g/dL (ref 11.6–15.9)
LYMPH#: 2.3 10*3/uL (ref 0.9–3.3)
LYMPH%: 52.4 % — AB (ref 14.0–49.7)
MCH: 29.2 pg (ref 25.1–34.0)
MCHC: 35.5 g/dL (ref 31.5–36.0)
MCV: 82.4 fL (ref 79.5–101.0)
MONO#: 0.5 10*3/uL (ref 0.1–0.9)
MONO%: 10.3 % (ref 0.0–14.0)
NEUT%: 36.6 % — ABNORMAL LOW (ref 38.4–76.8)
NEUTROS ABS: 1.6 10*3/uL (ref 1.5–6.5)
PLATELETS: 223 10*3/uL (ref 145–400)
RBC: 4.48 10*6/uL (ref 3.70–5.45)
RDW: 12.7 % (ref 11.2–14.5)
WBC: 4.4 10*3/uL (ref 3.9–10.3)

## 2015-09-15 NOTE — Assessment & Plan Note (Signed)
Right breast biopsy 08/26/2015: DCIS with calcifications, complex sclerosing lesion, ER 0%, PR 0%, intermediate to high-grade, Tis N0 stage 0; mammogram revealed a 3 cm lesion, second group of calcifications medial to this not biopsied   Pathology review: I discussed with the patient the difference between DCIS and invasive breast cancer. It is considered a precancerous lesion. DCIS is classified as a 0. It is generally detected through mammograms as calcifications. We discussed the significance of grades and its impact on prognosis. We also discussed the importance of ER and PR receptors and their implications to adjuvant treatment options. Prognosis of DCIS dependence on grade, comedo necrosis. It is anticipated that if not treated, 20-30% of DCIS can develop into invasive breast cancer.  Recommendation: Patient plans to do bilateral mastectomies No further treatment recommended  Return to clinic as needed.

## 2015-09-15 NOTE — Progress Notes (Signed)
[facility] CONSULT NOTE  Patient Care Team: [name], MD as PCP - General (Family Medicine)  CHIEF COMPLAINTS/PURPOSE OF CONSULTATION:  Newly diagnosed breast cancer  HISTORY OF PRESENTING ILLNESS:  Susan [name] 65 y.o. female is here because of recent diagnosis of right breast DCIS. She originally had left breast DCIS in 2004 and was treated with lumpectomy followed by antiestrogen therapy with tamoxifen. She developed blood clots and the treatment was discontinued. Recently she had a screening mammogram the detected 2 groups of calcifications in the right breast. One other group of calcifications biopsy-proven to be DCIS intermediate to high-grade that was ER/PR negative. She had seen Dr. Dalbert Batman and the patient had decided to undergo bilateral mastectomies and she is here today to discuss if there was any role of adjuvant therapy. She just undergone genetic counseling because of her extensive family history of breast cancer, prostate cancer and melanoma.  I reviewed her records extensively and collaborated the history with the patient.  SUMMARY OF ONCOLOGIC HISTORY:   Breast cancer of upper-outer quadrant of right female breast ([place])   09/15/2002 Initial Biopsy Left breast DCIS ER/PR positive, tamoxifen briefly develop blood clots and was discontinued   08/26/2015 Initial Diagnosis Right breast biopsy: DCIS with calcifications, complex sclerosing lesion, ER 0%, PR 0%, intermediate to high-grade, Tis N0 stage 0; mammogram revealed a 3 cm lesion, second group of calcifications medial to this not biopsied    MEDICAL HISTORY:  Past Medical History  Diagnosis Date  . Sleep apnea     on CPAP  . Osteopenia   . Fatty liver disease, nonalcoholic   . Obesity   . History of blood clots 2004    during cancer treatment  . Anxiety   . Osteoarthritis   . DDD (degenerative disc disease)   . DJD (degenerative joint disease)   . Cancer [facility]) 2004    ductal carcinoma; lumpectomy   . Depression   . GERD (gastroesophageal reflux disease)   . Fibromyalgia   . Peripheral neuropathy ([place])   . Muscle spasm 12/30/2011  . Anemia 01/28/2012  . HTN (hypertension) 01/28/2012  . Hyperlipidemia 01/28/2012  . Hyperglycemia 01/28/2012  . Preventative health care 01/28/2012  . Pain of left heel 01/28/2012  . Nasal polyp 08/30/2012  . Tobacco abuse-unspec 10/12/2013  . Hip pain, right 10/12/2013  . Hypothyroidism 02/14/2014    SURGICAL HISTORY: Past Surgical History  Procedure Laterality Date  . Breast surgery  2004    lumpectomy  . Joint replacement      right total knee  . Bilateral oophorectomy  01/2003  . Nasal septum surgery    . Carpal tunnel release      right  . Abdominal hysterectomy  1991  . Thyroidectomy, partial  mid 80's    right side    SOCIAL HISTORY: Social History   Social History  . Marital Status: Married    Spouse Name: N/A  . Number of Children: N/A  . Years of Education: N/A   Occupational History  . Not on file.   Social History Main Topics  . Smoking status: Light Tobacco Smoker -- 0.50 packs/day for 42 years    Types: Cigarettes  . Smokeless tobacco: Never Used     Comment: Pt currently using nicotine patch.  tf,cma  . Alcohol Use: No  . Drug Use: No  . Sexual Activity: No   Other Topics Concern  . Not on file   Social History Narrative  FAMILY HISTORY: Family History  Problem Relation Age of Onset  . Cancer Mother 87    liver cancer, hep c  . Heart disease Mother     chf  . Hypertension Mother   . Hyperlipidemia Mother   . Osteoporosis Mother   . Cancer Father 51    lung; smoker  . COPD Father   . Heart disease Sister     mvp  . Breast cancer Sister     dx. 40s  . Non-Hodgkin's lymphoma Sister 77    large B cell  . Hypertension Brother   . Arthritis Brother   . Kidney disease Brother   . Prostate cancer Brother     dx. early 60s  . Other Daughter     Beal's Syndrome  . Arthritis Daughter     Beal's  .  Arthritis Son     Beal's connective tissue disease  . Vision loss Maternal Grandmother   . Dementia Maternal Grandmother   . Coronary artery disease Maternal Grandmother   . Heart Problems Maternal Grandmother   . Vision loss Paternal Grandfather   . Hypertension Brother   . Benign prostatic hyperplasia Brother   . Leukemia Brother     chronic lymphatic leukemia - small/B cell  . Hypertension Brother   . COPD Brother   . Prostate cancer Brother     dx. mid-60s  . Myelodysplastic syndrome Brother     dx. early 70s  . Hypertension Brother   . Hyperlipidemia Brother   . Prostate cancer Brother     dx. mid-70s  . Melanoma Brother     (x2) melanomas dx. late 60s-early 70s  . Mental illness Brother     schizophrenia  . Breast cancer Brother     dx. 50s  . Cirrhosis Brother     hep c  . Hypertension Sister   . Other Sister     thalessemia, anemia; hx of hysterectomy in her 50s  . Hyperlipidemia Sister   . Gout Sister   . Diverticulitis Sister   . Other Daughter     overweight  . Breast cancer Maternal Aunt 46  . Lung cancer Cousin     maternal 1st cousin dx. 70 or younger; former smoker  . Colon cancer Cousin     maternal 1st cousin dx. late 50s-early 60s  . Cancer Cousin     maternal 1st cousin d. NOS cancer  . Emphysema Paternal Aunt   . Lung cancer Paternal Aunt     d. early 80s; smoker  . Leukemia Cousin     paternal 1st cousin d. early 40s  . Colon cancer Other 58    niece  . Melanoma Other     niece    ALLERGIES:  is allergic to chantix; ciprofloxacin; penicillins; tamoxifen; lyrica; neurontin; ceclor; and sulfa antibiotics.  MEDICATIONS:  Current Outpatient Prescriptions  Medication Sig Dispense Refill  . albuterol (PROVENTIL HFA;VENTOLIN HFA) 108 (90 BASE) MCG/ACT inhaler Inhale 2 puffs into the lungs every 4 (four) hours as needed for wheezing or shortness of breath (cough, shortness of breath or wheezing.). 1 Inhaler 1  . Blood Pressure Monitoring  (ADULT BLOOD PRESSURE CUFF LG) KIT Use as directed 1 each 0  . cholecalciferol (VITAMIN D) 1000 UNITS tablet Take 1,000 Units by mouth daily.    . cyclobenzaprine (FLEXERIL) 10 MG tablet Take 1 tablet (10 mg total) by mouth 3 (three) times daily as needed for muscle spasms. 60 tablet 5  . DULoxetine (CYMBALTA)   30 MG capsule Take 3 capsules (90 mg total) by mouth daily. 270 capsule 1  . esomeprazole (NEXIUM) 20 MG capsule Take 20 mg by mouth daily at 12 noon.    . fenofibrate 160 MG tablet Take 1 tablet (160 mg total) by mouth daily. 90 tablet 3  . hydrochlorothiazide (HYDRODIURIL) 25 MG tablet TAKE 1 TABLET (25 MG TOTAL) BY MOUTH DAILY. 30 tablet 5  . isoniazid (NYDRAZID) 300 MG tablet TAKE 1 TABLET (300 MG TOTAL) BY MOUTH DAILY. 30 tablet 5  . levothyroxine (SYNTHROID, LEVOTHROID) 25 MCG tablet Take 1 tablet (25 mcg total) by mouth daily. 90 tablet 1  . metFORMIN (GLUCOPHAGE) 500 MG tablet Take 1 tablet (500 mg total) by mouth 2 (two) times daily with a meal. 180 tablet 1  . metoprolol (LOPRESSOR) 50 MG tablet TAKE 1 TABLET (50 MG TOTAL) BY MOUTH 2 (TWO) TIMES DAILY. 180 tablet 3  . Multiple Vitamins-Minerals (MULTIVITAMIN WITH MINERALS) tablet Take 1 tablet by mouth daily.    . Naftifine HCl 2 % CREA Apply 1 application topically daily. For 4 weeks 60 g 11  . temazepam (RESTORIL) 7.5 MG capsule Take 1 capsule (7.5 mg total) by mouth at bedtime as needed for sleep. 30 capsule 5   No current facility-administered medications for this visit.    REVIEW OF SYSTEMS:   Constitutional: Denies fevers, chills or abnormal night sweats Eyes: Denies blurriness of vision, double vision or watery eyes Ears, nose, mouth, throat, and face: Denies mucositis or sore throat Respiratory: Denies cough, dyspnea or wheezes Cardiovascular: Denies palpitation, chest discomfort or lower extremity swelling Gastrointestinal:  Denies nausea, heartburn or change in bowel habits Skin: Denies abnormal skin  rashes Lymphatics: Denies new lymphadenopathy or easy bruising Neurological:Denies numbness, tingling or new weaknesses Behavioral/Psych: Mood is stable, no new changes  Breast:  Denies any palpable lumps or discharge All other systems were reviewed with the patient and are negative.  PHYSICAL EXAMINATION: ECOG PERFORMANCE STATUS: 0 - Asymptomatic  Filed Vitals:   09/15/15 1311  BP: 130/66  Pulse: 73  Temp: 97.5 F (36.4 C)  Resp: 20   Filed Weights   09/15/15 1311  Weight: 237 lb 12.8 oz (107.865 kg)    GENERAL:alert, no distress and comfortable SKIN: skin color, texture, turgor are normal, no rashes or significant lesions EYES: normal, conjunctiva are pink and non-injected, sclera clear OROPHARYNX:no exudate, no erythema and lips, buccal mucosa, and tongue normal  NECK: supple, thyroid normal size, non-tender, without nodularity LYMPH:  no palpable lymphadenopathy in the cervical, axillary or inguinal LUNGS: clear to auscultation and percussion with normal breathing effort HEART: regular rate & rhythm and no murmurs and no lower extremity edema ABDOMEN:abdomen soft, non-tender and normal bowel sounds Musculoskeletal:no cyanosis of digits and no clubbing  PSYCH: alert & oriented x 3 with fluent speech NEURO: no focal motor/sensory deficits BREAST: No palpable nodules in breast. No palpable axillary or supraclavicular lymphadenopathy (exam performed in the presence of a chaperone)   LABORATORY DATA:  I have reviewed the data as listed Lab Results  Component Value Date   WBC 4.4 09/15/2015   HGB 13.1 09/15/2015   HCT 36.9 09/15/2015   MCV 82.4 09/15/2015   PLT 223 09/15/2015   Lab Results  Component Value Date   NA 141 09/15/2015   K 4.0 09/15/2015   CL 102 07/28/2015   CO2 30* 09/15/2015    RADIOGRAPHIC STUDIES: I have personally reviewed the radiological reports and agreed with the findings in the   report.  ASSESSMENT AND PLAN:  Breast cancer of upper-outer  quadrant of right female breast ([facility]) Right breast biopsy 08/26/2015: DCIS with calcifications, complex sclerosing lesion, ER 0%, PR 0%, intermediate to high-grade, Tis N0 stage 0; mammogram revealed a 3 cm lesion, second group of calcifications medial to this not biopsied   Pathology review: I discussed with the patient the difference between DCIS and invasive breast cancer. It is considered a precancerous lesion. DCIS is classified as a 0. It is generally detected through mammograms as calcifications. We discussed the significance of grades and its impact on prognosis. We also discussed the importance of ER and PR receptors and their implications to adjuvant treatment options. Prognosis of DCIS dependence on grade, comedo necrosis. It is anticipated that if not treated, 20-30% of DCIS can develop into invasive breast cancer.  Recommendation: Patient plans to do bilateral mastectomies No further treatment recommended  Return to clinic as needed.   All questions were answered. The patient knows to call the clinic with any problems, questions or concerns.    Gudena, Vinay K, MD 09/15/2015   

## 2015-09-15 NOTE — Progress Notes (Signed)
REFERRING PROVIDER: Fanny Skates, MD  OTHER PROVIDER(S): Susan Lose, MD   PRIMARY PROVIDER:  [name], MD  PRIMARY REASON FOR VISIT:  1. History of breast cancer in female   2. Malignant neoplasm of right female breast, unspecified site of breast ([place])   3. Family history of breast cancer in female   73. Family history of colon cancer   5. Family history of prostate cancer   6. Family history of melanoma   7. Family history of liver cancer   8. Family history of lung cancer   9. Family history of non-Hodgkin's lymphoma   10. Family history of leukemia   11. Family history of cancer      HISTORY OF PRESENT ILLNESS:   Susan Susan [name], a 65 y.o. female, was seen for a [place] cancer genetics consultation at the request of Dr. Dalbert Susan [name] due to a personal history of bilateral breast cancers and family history of early-onset breast cancer and family history of prostate, melanoma, colon, and other cancers.  Susan Susan [name] presents to clinic today with her husband to discuss the possibility of a hereditary predisposition to cancer, genetic testing, and to further clarify her future cancer risks, as well as potential cancer risks for family members.   In March 2004, at the age of 54, Susan Susan [name] was diagnosed with DCIS of the left breast.  Hormone receptor status was ER-, PR+. This was treated with left lumpectomy.  In April 2017, at the age of 71, Susan Susan [name] was diagnosed with DCIS of the right breast.  Hormone receptor status was ER/PR-.  This will be treated with bilateral mastectomies.  Genetic testing will help inform understanding of future cancer risks and implementation of future cancer screening.  HORMONAL RISK FACTORS:  Menarche was at age 42.  First live birth at age 64.  OCP use for approximately 10 years.  Ovaries intact: no.  Hysterectomy: yes.  Menopausal status: postmenopausal.  HRT use: 0 years. Colonoscopy: yes; normal - most recent was in 2007, is due for next one  this year Mammogram within the last year: yes. Number of breast biopsies: multiple. Up to date with pelvic exams:  yes. Any excessive radiation exposure/other exposures in the past:  Hx of radiation with multiple x-rays and previous cancer treatment; additional hx of secondhand smoke exposure  Past Medical History  Diagnosis Date  . Sleep apnea     on CPAP  . Osteopenia   . Fatty liver disease, nonalcoholic   . Obesity   . History of blood clots 2004    during cancer treatment  . Anxiety   . Osteoarthritis   . DDD (degenerative disc disease)   . DJD (degenerative joint disease)   . Cancer [facility]) 2004    ductal carcinoma; lumpectomy  . Depression   . GERD (gastroesophageal reflux disease)   . Fibromyalgia   . Peripheral neuropathy ([place])   . Muscle spasm 12/30/2011  . Anemia 01/28/2012  . HTN (hypertension) 01/28/2012  . Hyperlipidemia 01/28/2012  . Hyperglycemia 01/28/2012  . Preventative health care 01/28/2012  . Pain of left heel 01/28/2012  . Nasal polyp 08/30/2012  . Tobacco abuse-unspec 10/12/2013  . Hip pain, right 10/12/2013  . Hypothyroidism 02/14/2014    Past Surgical History  Procedure Laterality Date  . Breast surgery  2004    lumpectomy  . Joint replacement      right total knee  . Bilateral oophorectomy  01/2003  . Nasal septum surgery    . Carpal tunnel  release      right  . Abdominal hysterectomy  1991  . Thyroidectomy, partial  mid 80's    right side    Social History   Social History  . Marital Status: Married    Spouse Susan [name]: N/A  . Number of Children: N/A  . Years of Education: N/A   Social History Main Topics  . Smoking status: Light Tobacco Smoker -- 0.50 packs/day for 42 years    Types: Cigarettes  . Smokeless tobacco: Never Used     Comment: Pt currently using nicotine patch.  tf,cma  . Alcohol Use: No  . Drug Use: No  . Sexual Activity: No   Other Topics Concern  . None   Social History Narrative     FAMILY HISTORY:  We obtained a  detailed, 4-generation family history.  Significant diagnoses are listed below: Family History  Problem Relation Age of Onset  . Cancer Mother 77    liver cancer, hep c  . Heart disease Mother     chf  . Hypertension Mother   . Hyperlipidemia Mother   . Osteoporosis Mother   . Cancer Father 71    lung; smoker  . COPD Father   . Heart disease Sister     mvp  . Breast cancer Sister     dx. 36s  . Non-Hodgkin's lymphoma Sister 42    large B cell  . Hypertension Brother   . Arthritis Brother   . Kidney disease Brother   . Prostate cancer Brother     dx. early 1s  . Other Daughter     Beal's Syndrome  . Arthritis Daughter     Beal's  . Arthritis Son     Beal's connective tissue disease  . Vision loss Maternal Grandmother   . Dementia Maternal Grandmother   . Coronary artery disease Maternal Grandmother   . Heart Problems Maternal Grandmother   . Vision loss Paternal Grandfather   . Hypertension Brother   . Benign prostatic hyperplasia Brother   . Leukemia Brother     chronic lymphatic leukemia - small/B cell  . Hypertension Brother   . COPD Brother   . Prostate cancer Brother     dx. mid-60s  . Myelodysplastic syndrome Brother     dx. early 80s  . Hypertension Brother   . Hyperlipidemia Brother   . Prostate cancer Brother     dx. mid-70s  . Melanoma Brother     (x2) melanomas dx. late 60s-early 73s  . Mental illness Brother     schizophrenia  . Breast cancer Brother     dx. 3s  . Cirrhosis Brother     hep c  . Hypertension Sister   . Other Sister     thalessemia, anemia; hx of hysterectomy in her 59s  . Hyperlipidemia Sister   . Gout Sister   . Diverticulitis Sister   . Other Daughter     overweight  . Breast cancer Maternal Aunt 2  . Lung cancer Cousin     maternal 1st cousin dx. 58 or younger; former smoker  . Colon cancer Cousin     maternal 1st cousin dx. late 15s-early 60s  . Cancer Cousin     maternal 1st cousin d. NOS cancer  . Emphysema  Paternal Aunt   . Lung cancer Paternal Aunt     d. early 60s; smoker  . Leukemia Cousin     paternal 1st cousin d. early 60s  . Colon cancer Other  46    niece  . Melanoma Other     niece    Susan Susan [name] has two daughters and one son--ages 30, 13, and 38.  She has two grandsons and one granddaughter--ages 2-7.  None of her children or grandchildren have ever had cancer or tumors.  One of her daughters and her son has Beals syndrome, which seems to have been inherited from their father.  Susan Susan [name] has two full sisters, one of whom died of non-hodgkin's lymphoma at 46.  This same sister also was diagnosed with breast cancer in her 55s.  Susan Susan [name] other sister is currently 64 and has not had cancer, but she does have a history of a hysterectomy in her 66s.  Susan Susan [name] has six full brothers.  Three of her brothers have had prostate cancers dx. In their 60s-70s.  One of these brothers has also had two melanoma diagnoses in his 7s.  Another one of these brothers has recently been diagnosed with myelodysplatic syndrome in his early 34s.  A fourth brother has a history of chronic lymphatic leukemia and a fifth brother was diagnosed with breast cancer in his 76s.  Only one brother has not had cancer.  Susan Susan [name] reports a history of cancer for two of her nieces--one was diagnosed with colon cancer at 28 and another had a surgery and graft for a melanoma cancer on her nose.    Susan Susan [name] mother died of liver cancer in the setting of hepatitis C at the age of 1. She had one full sister and two maternal half-brothers.  Her sister died of breast cancer at the age of 75.  This sister had two sons and one daughter.  Her daughter has had lung cancer and one son was diagnosed with colon cancer in his late 101s-early 60s.  Both of the half-brothers died in their 37s and one had a son diagnosed with an unspecified type of cancer, but Susan Susan [name] has no further information for these relatives.  Ms.  Susan [name] maternal grandmother died of atherosclerosis at the age of 35.  Her grandfather died before she was born and she has no information for him.  She has no further information for any maternal great aunts/uncles or great grandparents.  Susan Susan [name] father was a smoker and he died of lung cancer at 61.  He had two full sisters and one full brother.  One sister died of a childhood illness at the age of 42.  The other sister was a smoker and died of emphysema and lung cancer in her early 37s.  This sister had one son and one daughter and her daughter died of leukemia in her early 60s.  His brother died of lung disease in his early 47s.  Susan Susan [name] paternal grandfather died before she was born and she has no information for her.  Her paternal grandfather died in his sleep at the age of 73.  She has no further information for any paternal great aunts/uncles or great grandparents.  She reports no known family history of genetic testing for hereditary cancer.  Patient's maternal ancestors are of [place] and [place] descent, and paternal ancestors are of [place] descent. There is no reported Ashkenazi Jewish ancestry. There is no known consanguinity.  GENETIC COUNSELING ASSESSMENT: Susan Susan [name] is a 65 y.o. female with a personal history and family history of cancer which somewhat suggestive of a hereditary cancer syndrome and predisposition to cancer. We, therefore, discussed and recommended the following at today's  visit.   DISCUSSION: We reviewed the characteristics, features and inheritance patterns of hereditary cancer syndromes, particularly those caused by mutations in the BRCA1/2 genes. We also discussed genetic testing, including the appropriate family members to test, the process of testing, insurance coverage and turn-around-time for results. We discussed the implications of a negative, positive and/or variant of uncertain significant result. We recommended Susan Susan [name] pursue genetic  testing for the 24-gene Custom Cancer Panel with MSH2 Exons 1-7 Inversion Analysis through [facility] in [place], [place].  The 24-gene Custom Panel offered by [facility] Hope Pigeon, [place]) includes sequencing and/or deletion/duplication analysis for the following 24 genes:  ATM, BAP1, BARD1, BRCA1, BRCA2, BRIP1, CDH1, CDK4, CDKN2A, CHEK2, EPCAM, FANCC, MITF, MLH1, MSH2, MSH6, NBN, PALB2, PMS2, PTEN, RAD51C, RAD51D, TP53, and XRCC2.    Based on Susan Susan [name]'s personal and family history of cancer, she meets medical criteria for genetic testing. Despite that she meets criteria, she may still have an out of pocket cost. We discussed that if her out of pocket cost for testing is over $100, the laboratory will call and confirm whether she wants to proceed with testing.  If the out of pocket cost of testing is less than $100 she will be billed by the genetic testing laboratory.   PLAN: After considering the risks, benefits, and limitations, Susan Susan [name]  provided informed consent to pursue genetic testing and the blood sample was sent to [facility] for analysis of the 24-gene Custom Panel. Results should be available within approximately 2-3 weeks' time, at which point they will be disclosed by telephone to Ms. Arruda, as will any additional recommendations warranted by these results. Ms. Ewy will receive a summary of her genetic counseling visit and a copy of her results once available. This information will also be available in Epic. We encouraged Ms. Cedillo to remain in contact with cancer genetics annually so that we can continuously update the family history and inform her of any changes in cancer genetics and testing that may be of benefit for her family. Ms. Beining questions were answered to her satisfaction today. Our contact information was provided should additional questions or concerns arise.  Thank you for the referral and allowing [place] to share in the care of your  patient.   Jeanine Luz, MS, [facility] Certified Genetic Counselor [email or web address] Phone: [phone]  The patient was seen for a total of 60 minutes in face-to-face genetic counseling.  This patient was discussed with Drs. Magrinat, Lindi Adie and/or Burr Medico who agrees with the above.    _______________________________________________________________________ For Office Staff:  Number of people involved in session: 2 Was an Intern/ student involved with case: no

## 2015-09-20 ENCOUNTER — Telehealth: Payer: Self-pay | Admitting: *Deleted

## 2015-09-20 NOTE — Telephone Encounter (Signed)
Spoke with patient to follow up after new patient visit.  She is doing well. Gave contact information and discussed navigation resources.  Encouraged her to call with any questions or concerns.

## 2015-09-22 ENCOUNTER — Telehealth: Payer: Self-pay | Admitting: *Deleted

## 2015-09-22 NOTE — Telephone Encounter (Signed)
"  I saw Dr. Lindi Adie last week for the first time.  I wonder if the lab is okay.  I have two brothers with Leukemia so I'm really concerned about the white blood cells." Advised WBC, Neutrophils, AST labs with this call.  No provider notes for patient with these results.

## 2015-10-06 ENCOUNTER — Telehealth: Payer: Self-pay | Admitting: Genetic Counselor

## 2015-10-06 NOTE — Telephone Encounter (Signed)
Discussed with Susan [name] that her genetic test results were negative for known pathogenic mutations within any of 24 genes on a Custom Cancer Panel (breast, ovarian, melanoma, and other related cancers).  One uncertain change was found in one copy of the ATM gene.  Reviewed that we treat this like a negative test result until it gets updated by the lab and reviewed why we do that.  Encouraged her to keep her phone number up-to-date with [place], so that we can call and let her know if it gets updated by the lab in the future.  Discussed that she should continue to follow her doctors' recommendations for future cancer screening.  Her daughters and other close female relatives are still at an increased risk for breast cancer due to the family history, they can likely begin mammogram screening (if they have not already done so) in their 84s based on Susan [name]'s sister's diagnosis in her 34s.  Encouraged her to let her affected relatives (those brothers who have a history of prostate cancer and the one brother w/ female breast cancer) know that they are probably eligible for genetic counseling and testing, if interested.  Their testing may give [place] even more information about the personal and family cancer risks.  Susan [name] is welcome to call with any questions.  She would like a copy of her results and a results letter emailed to her, and I am happy to do that.

## 2015-10-10 ENCOUNTER — Encounter: Payer: Self-pay | Admitting: Genetic Counselor

## 2015-10-10 ENCOUNTER — Ambulatory Visit: Payer: Self-pay | Admitting: Genetic Counselor

## 2015-10-10 DIAGNOSIS — Z809 Family history of malignant neoplasm, unspecified: Secondary | ICD-10-CM

## 2015-10-10 DIAGNOSIS — Z1379 Encounter for other screening for genetic and chromosomal anomalies: Secondary | ICD-10-CM

## 2015-10-10 DIAGNOSIS — C50411 Malignant neoplasm of upper-outer quadrant of right female breast: Secondary | ICD-10-CM

## 2015-10-10 DIAGNOSIS — Z853 Personal history of malignant neoplasm of breast: Secondary | ICD-10-CM

## 2015-10-10 DIAGNOSIS — Z800 Family history of malignant neoplasm of digestive organs: Secondary | ICD-10-CM

## 2015-10-10 DIAGNOSIS — Z803 Family history of malignant neoplasm of breast: Secondary | ICD-10-CM

## 2015-10-13 ENCOUNTER — Other Ambulatory Visit: Payer: Self-pay | Admitting: General Surgery

## 2015-10-13 DIAGNOSIS — C50111 Malignant neoplasm of central portion of right female breast: Secondary | ICD-10-CM

## 2015-10-17 DIAGNOSIS — Z1379 Encounter for other screening for genetic and chromosomal anomalies: Secondary | ICD-10-CM | POA: Insufficient documentation

## 2015-10-17 NOTE — Progress Notes (Signed)
GENETIC TEST RESULT  HPI: Ms. Dymek was previously seen in the [place] clinic due to a personal history of metachronous bilateral breast cancers, family history of breast and other cancers, and concerns regarding a hereditary predisposition to cancer. Please refer to our prior cancer genetics clinic note from Sep 15, 2015 for more information regarding Ms. Traber's medical, social and family histories, and our assessment and recommendations, at the time. Ms. Dignan recent genetic test results were disclosed to her, as were recommendations warranted by these results. These results and recommendations are discussed in more detail below.  GENETIC TEST RESULTS: At the time of Ms. Mcniel visit on 09/15/15, we recommended she pursue genetic testing of the 24-gene Custom Cancer Panel with MSH2 Exons 1-7 Inversion Analysis through [facility].  The Custom Panel performed by [facility] [place] Pigeon, [place]) includes sequencing and/or deletion/duplication analysis for the following 24 genes:  ATM, BAP1, BARD1, BRCA1, BRCA2, BRIP1, CDH1, CDK4, CDKN2A, CHEK2, EPCAM, FANCC, MITF, MLH1, MSH2, MSH6, NBN, PALB2, PMS2, PTEN, RAD51C, RAD51D, TP53, and XRCC2.  Those results are now back, the report date for which is Oct 05, 2015.  Genetic testing was normal, and did not reveal a deleterious mutation in these genes.  One variant of uncertain significance (VUS) was found in the ATM gene.  The test report will be scanned into EPIC and will be located under the Results Review tab in the Pathology>Molecular Pathology section.   Genetic testing did identify a variant of uncertain significance (VUS) called "[id number].[id number] (p.[id number])" in one copy of the ATM gene. At this time, it is unknown if this VUS is associated with an increased risk for cancer or if this is a normal finding. Since this VUS result is uncertain, it cannot help guide screening recommendations, and family members should  not be tested for this VUS to help define their own cancer risks.  Also, we all have variants within our genes that make [place] unique individuals--most of these variants are benign.  Thus, we treat this VUS as a negative result.   With time, we suspect the lab will reclassify this variant and when they do, we will try to re-contact Ms. Kishbaugh to discuss the reclassification further.  We also encouraged Ms. Steinhaus to contact us in a year or two to obtain an update on the status of this VUS.  We discussed with Ms. Raphael that since the current genetic testing is not perfect, it is possible there may be a gene mutation in one of these genes that current testing cannot detect, but that chance is small. We also discussed, that it is possible that another gene that has not yet been discovered, or that we have not yet tested, is responsible for the cancer diagnoses in the family, and it is, therefore, important to remain in touch with cancer genetics in the future so that we can continue to offer Ms. Rudder the most up-to-date genetic testing.   CANCER SCREENING RECOMMENDATIONS: We still do not have an explanation for the personal and family history of cancer.  Given Ms. Miltner's personal and family histories, we must interpret these negative results with some caution.  Families with features suggestive of hereditary risk for cancer tend to have multiple family members with cancer, diagnoses in multiple generations and diagnoses before the age of 26. Ms. Covell family exhibits some of these features. Thus this result may simply reflect our current inability to detect all mutations within these genes or there may be a different  gene that has not yet been discovered or tested.  There is also a chance that there is a genetic mutation in the family that explains some of the family history of cancer that Ms. Rudy herself just did not inherit.  Testing of affected family members may give [place] more information  about the personal and familial cancer risks.  In the meantime, we recommend that Ms. Coggeshall continue to follow her doctors' recommendations for future cancer screening.  RECOMMENDATIONS FOR FAMILY MEMBERS: Women in this family are at an increased risk of developing cancer, over the general population risk, simply due to the family history of cancer. We recommended women in this family have a yearly mammogram beginning at age 99, or 36 years younger than the earliest onset of cancer, an annual clinical breast exam, and perform monthly breast self-exams.  Thus, Ms. Lindahl daughters and nieces can likely begin annual mammogram screening in their 53s, due to Ms. Lucking's sister's diagnosis in her 23s. Women in this family should also have a gynecological exam as recommended by their primary provider. All family members should have a colonoscopy by age 39.  Based on Ms. Wildasin's family history, we recommended her brother, who was diagnosed with breast cancer in his 33s, have genetic counseling and testing.  Ms. Cohill other brothers who have had prostate cancer are also likely to be eligible for genetic counseling and testing.  Ms. Leckrone will let us know if we can be of any assistance in coordinating genetic counseling and/or testing for these family members.   FOLLOW-UP: Lastly, we discussed with Ms. Tusing that cancer genetics is a rapidly advancing field and it is possible that new genetic tests will be appropriate for her and/or her family members in the future. We encouraged her to remain in contact with cancer genetics on an annual basis so we can update her personal and family histories and let her know of advances in cancer genetics that may benefit this family.   Our contact number was provided. Ms. Schnake questions were answered to her satisfaction, and she knows she is welcome to call us at anytime with additional questions or concerns.   Jeanine Luz, MS, [facility] Certified  Genetic Counselor [email or web address] Phone: [phone]

## 2015-10-18 ENCOUNTER — Encounter (HOSPITAL_COMMUNITY)
Admission: RE | Admit: 2015-10-18 | Discharge: 2015-10-18 | Disposition: A | Discharge status: Home / Self Care | Payer: 59 | Source: Ambulatory Visit | Attending: General Surgery | Admitting: General Surgery

## 2015-10-18 ENCOUNTER — Encounter (HOSPITAL_COMMUNITY): Payer: Self-pay

## 2015-10-18 DIAGNOSIS — J449 Chronic obstructive pulmonary disease, unspecified: Secondary | ICD-10-CM | POA: Insufficient documentation

## 2015-10-18 DIAGNOSIS — E039 Hypothyroidism, unspecified: Secondary | ICD-10-CM | POA: Diagnosis not present

## 2015-10-18 DIAGNOSIS — C50911 Malignant neoplasm of unspecified site of right female breast: Secondary | ICD-10-CM | POA: Diagnosis not present

## 2015-10-18 DIAGNOSIS — Z01812 Encounter for preprocedural laboratory examination: Secondary | ICD-10-CM | POA: Diagnosis not present

## 2015-10-18 DIAGNOSIS — Z01818 Encounter for other preprocedural examination: Secondary | ICD-10-CM | POA: Diagnosis not present

## 2015-10-18 DIAGNOSIS — E785 Hyperlipidemia, unspecified: Secondary | ICD-10-CM | POA: Insufficient documentation

## 2015-10-18 DIAGNOSIS — F17200 Nicotine dependence, unspecified, uncomplicated: Secondary | ICD-10-CM | POA: Diagnosis not present

## 2015-10-18 DIAGNOSIS — K219 Gastro-esophageal reflux disease without esophagitis: Secondary | ICD-10-CM | POA: Insufficient documentation

## 2015-10-18 DIAGNOSIS — E119 Type 2 diabetes mellitus without complications: Secondary | ICD-10-CM | POA: Insufficient documentation

## 2015-10-18 DIAGNOSIS — I10 Essential (primary) hypertension: Secondary | ICD-10-CM | POA: Insufficient documentation

## 2015-10-18 DIAGNOSIS — G4733 Obstructive sleep apnea (adult) (pediatric): Secondary | ICD-10-CM | POA: Insufficient documentation

## 2015-10-18 HISTORY — DX: Chronic obstructive pulmonary disease, unspecified: J44.9

## 2015-10-18 HISTORY — DX: Type 2 diabetes mellitus without complications: E11.9

## 2015-10-18 HISTORY — DX: Peripheral vascular disease, unspecified: I73.9

## 2015-10-18 LAB — COMPREHENSIVE METABOLIC PANEL
ALK PHOS: 45 U/L (ref 38–126)
ALT: 72 U/L — AB (ref 14–54)
AST: 46 U/L — AB (ref 15–41)
Albumin: 4.2 g/dL (ref 3.5–5.0)
Anion gap: 8 (ref 5–15)
BUN: 13 mg/dL (ref 6–20)
CALCIUM: 9.7 mg/dL (ref 8.9–10.3)
CHLORIDE: 104 mmol/L (ref 101–111)
CO2: 27 mmol/L (ref 22–32)
CREATININE: 0.85 mg/dL (ref 0.44–1.00)
GFR calc Af Amer: >60 mL/min (ref >60)
GFR calc non Af Amer: >60 mL/min (ref >60)
GLUCOSE: 110 mg/dL — AB (ref 65–99)
Potassium: 3.9 mmol/L (ref 3.5–5.1)
SODIUM: 139 mmol/L (ref 135–145)
Total Bilirubin: 0.9 mg/dL (ref 0.3–1.2)
Total Protein: 6.6 g/dL (ref 6.5–8.1)

## 2015-10-18 LAB — CBC WITH DIFFERENTIAL/PLATELET
BASOS ABS: 0 10*3/uL (ref 0.0–0.1)
Basophils Relative: 0 %
EOS ABS: 0 10*3/uL (ref 0.0–0.7)
EOS PCT: 0 %
HCT: 37.2 % (ref 36.0–46.0)
HEMOGLOBIN: 12.6 g/dL (ref 12.0–15.0)
LYMPHS ABS: 2.4 10*3/uL (ref 0.7–4.0)
LYMPHS PCT: 55 %
MCH: 28.3 pg (ref 26.0–34.0)
MCHC: 33.9 g/dL (ref 30.0–36.0)
MCV: 83.4 fL (ref 78.0–100.0)
MONOS PCT: 10 %
Monocytes Absolute: 0.4 10*3/uL (ref 0.1–1.0)
NEUTROS PCT: 35 %
Neutro Abs: 1.5 10*3/uL — ABNORMAL LOW (ref 1.7–7.7)
PLATELETS: 209 10*3/uL (ref 150–400)
RBC: 4.46 MIL/uL (ref 3.87–5.11)
RDW: 12.9 % (ref 11.5–15.5)
WBC: 4.3 10*3/uL (ref 4.0–10.5)

## 2015-10-18 LAB — GLUCOSE, CAPILLARY: GLUCOSE-CAPILLARY: 111 mg/dL — AB (ref 65–99)

## 2015-10-18 NOTE — Pre-Procedure Instructions (Addendum)
MAAT BAUGUESS  10/18/2015      [id number] - [place], [place] - [address] [place] [address] Phone: [phone] Fax: [phone]  [place] [address] Phone: [phone] Fax: [phone]    Your procedure is scheduled on 10/26/15.  Report to [facility] Admitting at 630 A.M.  Call this number if you have problems the morning of surgery:  [phone]   Remember:  Do not eat food or drink liquids after midnight.  Take these medicines the morning of surgery with A SIP OF WATER inhaler if needed(bring with you),duloxetine(cymbalta),nexium, levothyroxine,metoprolol,isoniazid  STOP all herbel meds, nsaids (aleve,naproxen,advil,ibuprofen) 5 days prior to surgery including vitamins, aspirin,   No metformin day of surgery    How to Manage Your Diabetes Before and After Surgery  Why is it important to control my blood sugar before and after surgery? . Improving blood sugar levels before and after surgery helps healing and can limit problems. . A way of improving blood sugar control is eating a healthy diet by: o  Eating less sugar and carbohydrates o  Increasing activity/exercise o  Talking with your doctor about reaching your blood sugar goals . High blood sugars (greater than 180 mg/dL) can raise your risk of infections and slow your recovery, so you will need to focus on controlling your diabetes during the weeks before surgery. . Make sure that the doctor who takes care of your diabetes knows about your planned surgery including the date and location.  How do I manage my blood sugar before surgery? . Check your blood sugar at least 4 times a day, starting 2 days before surgery, to make sure that the level is not too high or low. o Check your blood sugar the morning of your surgery when you wake  up and every 2 hours until you get to the Short Stay unit. . If your blood sugar is less than 70 mg/dL, you will need to treat for low blood sugar: o Do not take insulin. o Treat a low blood sugar (less than 70 mg/dL) with  cup of clear juice (cranberry or apple), 4 glucose tablets, OR glucose gel. o Recheck blood sugar in 15 minutes after treatment (to make sure it is greater than 70 mg/dL). If your blood sugar is not greater than 70 mg/dL on recheck, call [phone] for further instructions. . Report your blood sugar to the short stay nurse when you get to Short Stay.  . If you are admitted to the hospital after surgery: o Your blood sugar will be checked by the staff and you will probably be given insulin after surgery (instead of oral diabetes medicines) to make sure you have good blood sugar levels. o The goal for blood sugar control after surgery is 80-180 mg/dL.   WHAT DO I DO ABOUT MY DIABETES MEDICATION?   Marland Kitchen Do not take oral diabetes medicines (pills) the morning of surgery.(metformin)    Do not wear jewelry, make-up or nail polish.  Do not wear lotions, powders, or perfumes.  You may wear deodorant.  Do not shave 48 hours prior to surgery.  Men may shave face and neck.  Do not bring valuables to the hospital.  [facility] is not responsible for any belongings or valuables.  Contacts, dentures or bridgework may not be  worn into surgery.  Leave your suitcase in the car.  After surgery it may be brought to your room.  For patients admitted to the hospital, discharge time will be determined by your treatment team.  Patients discharged the day of surgery will not be allowed to drive home.   Name and phone number of your driver:    Special instructions:   Special Instructions: [facility] - Preparing for Surgery  Before surgery, you can play an important role.  Because skin is not sterile, your skin needs to be as free of germs as possible.  You can reduce the number of germs  on you skin by washing with CHG (chlorahexidine gluconate) soap before surgery.  CHG is an antiseptic cleaner which kills germs and bonds with the skin to continue killing germs even after washing.  Please DO NOT use if you have an allergy to CHG or antibacterial soaps.  If your skin becomes reddened/irritated stop using the CHG and inform your nurse when you arrive at Short Stay.  Do not shave (including legs and underarms) for at least 48 hours prior to the first CHG shower.  You may shave your face.  Please follow these instructions carefully:   1.  Shower with CHG Soap the night before surgery and the morning of Surgery.  2.  If you choose to wash your hair, wash your hair first as usual with your normal shampoo.  3.  After you shampoo, rinse your hair and body thoroughly to remove the Shampoo.  4.  Use CHG as you would any other liquid soap.  You can apply chg directly  to the skin and wash gently with scrungie or a clean washcloth.  5.  Apply the CHG Soap to your body ONLY FROM THE NECK DOWN.  Do not use on open wounds or open sores.  Avoid contact with your eyes ears, mouth and genitals (private parts).  Wash genitals (private parts)       with your normal soap.  6.  Wash thoroughly, paying special attention to the area where your surgery will be performed.  7.  Thoroughly rinse your body with warm water from the neck down.  8.  DO NOT shower/wash with your normal soap after using and rinsing off the CHG Soap.  9.  Pat yourself dry with a clean towel.            10.  Wear clean pajamas.            11.  Place clean sheets on your bed the night of your first shower and do not sleep with pets.  Day of Surgery  Do not apply any lotions/deodorants the morning of surgery.  Please wear clean clothes to the hospital/surgery center.  Please read over the following fact sheets that you were given. Pain Booklet and Surgical Site Infection Prevention

## 2015-10-18 NOTE — Progress Notes (Signed)
Per patient "need urinary catheter-bladder goes to sleep when has anesthesia"

## 2015-10-19 LAB — HEMOGLOBIN A1C
Hgb A1c MFr Bld: 6 % — ABNORMAL HIGH (ref 4.8–5.6)
Mean Plasma Glucose: 126 mg/dL

## 2015-10-19 NOTE — Progress Notes (Signed)
Anesthesia Chart Review:  Pt is a 65 year old female scheduled for R total mastectomy with R sentinel lymph node biopsy, inject blue dye R breast, L breast prophylactic mastectomy on 10/26/2015 with Dr. Dalbert Batman.   PCP is Dr. [name]. Pulmonologist is Dr. Lamonte Sakai; pt has appt 10/24/15.   PMH includes:  HTN, DM, OSA, hyperlipidemia, anemia, nonalcoholic fatty liver disease, hypothyroidism, COPD, tuberculosis (latent, finishing last month of 9 months of treatment), breast cancer, GERD. Current smoker. BMI 40.5  Preoperative labs reviewed.   - AST 46, ALT 72.  - HgbA1c 6.0, glucose 110  EKG 10/18/15: NSR. Possible Left atrial enlargement  Cardiac event monitor 10/11/14: No pathologic arrhythmias.   Exercise tolerance test 09/24/14 (results found in note by Kerin Ransom dated 09/24/14): - Pt exercised 5:46 of Bruce. Resting EKG normal. B/P 144/86 at rest, peak B/P 234/80. Test stopped secondary to fatigue, no chest pain. Max HR 136 = .85% of APMHR (133). Some up sloping ST depression noted in V5 but no other ischemic changes or arrhythmia.   Pt hospitalized 09/2014 for chest pain, r/o for ACS, exercise stress test deemed unconcerning. Followed up with Dr. Irish Lack who ordered cardiac event monitor (as above); pt to f/u in 6-8 weeks but never went back.   If no changes, I anticipate pt can proceed with surgery as scheduled.   Willeen Cass, FNP-BC [facility]/Anesthesiology Phone: [phone] 10/19/2015 2:00 PM

## 2015-10-24 ENCOUNTER — Ambulatory Visit (INDEPENDENT_AMBULATORY_CARE_PROVIDER_SITE_OTHER): Payer: 59 | Admitting: Emergency Medicine

## 2015-10-24 ENCOUNTER — Encounter: Payer: Self-pay | Admitting: Emergency Medicine

## 2015-10-24 VITALS — BP 124/72 | HR 74 | Ht 64.0 in | Wt 233.0 lb

## 2015-10-24 DIAGNOSIS — R590 Localized enlarged lymph nodes: Secondary | ICD-10-CM

## 2015-10-24 DIAGNOSIS — R7611 Nonspecific reaction to tuberculin skin test without active tuberculosis: Secondary | ICD-10-CM | POA: Diagnosis not present

## 2015-10-24 DIAGNOSIS — C50411 Malignant neoplasm of upper-outer quadrant of right female breast: Secondary | ICD-10-CM

## 2015-10-24 DIAGNOSIS — J449 Chronic obstructive pulmonary disease, unspecified: Secondary | ICD-10-CM | POA: Diagnosis not present

## 2015-10-24 DIAGNOSIS — Z227 Latent tuberculosis: Secondary | ICD-10-CM

## 2015-10-24 DIAGNOSIS — R599 Enlarged lymph nodes, unspecified: Secondary | ICD-10-CM | POA: Diagnosis not present

## 2015-10-24 NOTE — Progress Notes (Signed)
Subjective:    Patient ID: Susan [name], female    DOB: [birth year], 65 y.o.   MRN: [id number]  HPI 65 year old smoker (40 pack years) with a history of diabetes, left ductal carcinoma in situ treated with lumpectomy and tamoxifen, c/b VTE. Obstructive sleep apnea on CPAP, hypertension. I have reviewed the office notes from Dr. Lorelei Pont. She carries a history of asthma made in the last couple years when she had apparent flares. Has been treated with pred and abx on those occasions. Also started on Advair - stopped it about 10 days ago. She has a daily cough w gray phlegm - better w decreasing. She can exert indefinitely without SOB, works in her yard.   She underwent a CT scan of the chest on 09/23/14 in order to evaluate an episode of chest tightness and dyspnea, tachycardia that seemed to be best relieved by ativan. This study was reviewed by me today. It does not show any evidence of pulmonary embolism or infiltrate. I do not see any peripheral nodular disease. She does have some bilateral hilar and subcarinal lymphadenopathy. On comparison to her prior CT chest from 2008 the nodes are slightly larger. She does have a family hx of B cell lymphoma.  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 She has lived in [place], including [place].   [place] 11/26/14 -- follow-up visit for suspected asthma and a history of lymphadenopathy that was first done fine in 2008 and then re-characterized on CT scan of the chest from 09/23/14. She underwent full pulmonary function testing today which I have reviewed personally. This shows normal airflows without a bronchodilator response, possible restriction as identified by a mildly reduced residual volume and a normal diffusion capacity. Last time we empirically stopped Advair. She has decreased her cigarettes significantly, now at 3-4 cig a day. We discussed tools and techniques today to stop, to help with her quit date. She uses albuterol sometimes to relieve cough  and clear mucous.    ROV 01/11/15 -- follow up visit for hx LAD on CT chest, tobacco use without obstructive lung disease on PFT. She has not missed her Advair. She had a URI about 5 weeks ago, left her with residual cough. She has backtracked on cigarettes - back to 8-10 a day.   ROV 04/12/15 -- follow up visit for mild COPD, lymphadenopathy on CT chest  (last was 09/2014). She also has been treated for latent TB with isoniazid. She has albuterol available to use prn, has never needed it. Her exercise tolerance is slightly limited. Smokes 8-10 cig a day. Feels otherwise well. She is using nicotine patches sometimes.  We discussed strategies today. She is not quite ready to set quit date.   ROV 10/24/15 -- patient has a history of continued tobacco abuse, mild COPD, and latent TB that has been treated with isoniazid now approaching 9 months of therapy. She was dx with a second primary ductal CA recently, is scheduled for a double mastectomy this week. Her COPD has been stable - she rarely uses albuterol, about 2-3 puffs a week. Her smoking is much less - 4 cig a day.        Review of Systems  Constitutional: Negative for fever and unexpected weight change.  HENT: Negative for congestion, dental problem, ear pain, nosebleeds, postnasal drip, rhinorrhea, sinus pressure, sneezing, sore throat and trouble swallowing.   Eyes: Negative for redness and itching.  Respiratory: Positive for cough. Negative for chest tightness, shortness  of breath and wheezing.   Cardiovascular: Negative for palpitations and leg swelling.  Gastrointestinal: Negative for nausea and vomiting.  Genitourinary: Negative for dysuria.  Musculoskeletal: Negative for joint swelling.  Skin: Negative for rash.  Neurological: Negative for headaches.  Hematological: Does not bruise/bleed easily.  Psychiatric/Behavioral: Negative for dysphoric mood. The patient is not nervous/anxious.   She has worked as a Marine scientist.       Objective:    Physical Exam Filed Vitals:   10/24/15 0918  BP: 124/72  Pulse: 74  Height: 5\' 4"  (1.626 m)  Weight: 233 lb (105.688 kg)  SpO2: 98%   Gen: Pleasant, overwt, in no distress,  normal affect  ENT: No lesions,  mouth clear,  oropharynx clear, no postnasal drip  Neck: No JVD, no TMG, no carotid bruits  Lungs: No use of accessory muscles, clear without rales or rhonchi  Cardiovascular: RRR, heart sounds normal, no murmur or gallops, no peripheral edema  Musculoskeletal: No deformities, no cyanosis or clubbing  Neuro: alert, non focal  Skin: Warm, no lesions or rashes     Assessment & Plan:  COPD (chronic obstructive pulmonary disease) Clinical stable at this time. Uses albuterol when necessary. Defer a schedule bronchodilator this time. Important issue for [place] now is complete smoking cessation.  TB lung, latent She has completed 9 months of isoniazid and I believe she can stop at this time.  Breast cancer of upper-outer quadrant of right female breast [facility]) Planning for surgery this week  Mediastinal lymphadenopathy Last CT scan of the chest was in May 2016. I would like to repeat a scan this year. I will wait until she gets through her surgery and then we will schedule a repeat in 2017   Baltazar Apo, MD, PhD 10/24/2015, 9:46 AM [facility] [phone] or if no answer [phone]

## 2015-10-24 NOTE — Assessment & Plan Note (Signed)
She has completed 9 months of isoniazid and I believe she can stop at this time.

## 2015-10-24 NOTE — Assessment & Plan Note (Signed)
Last CT scan of the chest was in May 2016. I would like to repeat a scan this year. I will wait until she gets through her surgery and then we will schedule a repeat in 2017

## 2015-10-24 NOTE — H&P (Addendum)
Susan [name] Location: [facility] Patient #: [id number] DOB: 19-Aug-1950 Undefined / Language: Cleophus [name] / Race: White Female        History of Present Illness       The patient is a 65 year old female who presents with breast cancer. This is a 65 year old woman, referred by Dr. Almyra Free at the [facility] because of right breast cancer. She has a prior history of left breast cancer treated by Dr. Marylene Buerger and Dr. Marko Plume. Dr. [name] is her PCP. Dr. Baltazar Apo is her pulmonologist.      In 2004 she underwent left lumpectomy and adjuvant radiation therapy for ductal carcinoma in situ left breast. She took tamoxifen for 3 weeks and developed deep venous thrombosis and that was discontinued. She did well thereafter.       She gets annual screening mammograms. Recent screening mammograms show suspicious calcifications in the right breast upper outer quadrant and centrally, spending 3 cm x 1.9 cm. She went back for image guided biopsies and they discussed 2 areas. They biopsied a lateral area which showed intermediate to high-grade ductal carcinoma in situ, breast diagnostic profile pending. The also stated that there was an area of calcifications more medially and that if breast conservation was desired that this area should also be biopsied. I have reviewed her mammograms and regionally they are very dense and difficult to evaluate.      The patient states that she has decided to have bilateral mastectomies because she now has bilateral breast cancer and has a strong family history. I told her that was reasonable and we talked about this extensively.      Family history reveals maternal aunt died in her 23s of stage IV breast cancer. She has a sister who developed carcinoma in situ in her 28s but died of lymphoma. No ovarian or pancreatic cancer in the family.       She has moderately significant comorbidities including BMI of 40, non-insulin-dependent  diabetes, COPD, ongoing tobacco abuse, obstructive sleep apnea, GERD, hypertension, history thyroidectomy and taking Synthroid.  social history reveals that she has 2 daughters ages 37 and 34. Nobody has had genetic testing. One son. She is retired. Smokes a half a pack of cigarettes per day denies alcohol. Trying to quit tobacco.       We had a long discussion. She is not interested in reconstruction now or later. I told her that I would advise her to undergo right total mastectomy with right axillary sentinel node biopsy in case she had invasive disease, left total mastectomy. I described the incisions and the attempts that we will make to avoid dog ears both medially and laterally although this may be a little bit tricky because of her large body habitus laterally. She is accepting of all of that. She does not want reconstruction.      She will be referred for genetic counseling and testing and she is very interested in that. She will be referred to medical oncology and hopefully she can be seen before we do the surgery but certainly after.      She'll be scheduled for injection of blue dye right best breast, right total mastectomy, right axillary sentinel node biopsy, left prophylactic total mastectomy. I discussed the indications, details, techniques, and numerous risk of the surgery with her and her family members. She is aware of the risk of bleeding, infection, nerve damage with chronic pain, shoulder disability, arm swelling, arm numbness, and other  unforeseen problems. I told her that if her hormone receptors were positive that her oncologist  would discuss the use of aromatase inhibitors with her, including the risk of recurrent thrombo-embolic disease. She understands all these issues. At this time all her questions were answered. She agrees with this plan. We will proceed with scheduling   Addendum Note She has seen Dr. Lindi Adie for preop medical oncology consult and did not  want to change her treatment plan. I will refer her back to him post op. Genetics testing negative.   Other Problems  Anxiety Disorder Arthritis Back Pain Bladder Problems Breast Cancer Depression Gastroesophageal Reflux Disease General anesthesia - complications High blood pressure Lump In Breast Oophorectomy Bilateral. Other disease, cancer, significant illness Sleep Apnea Thyroid Disease Transfusion history  Past Surgical History  Breast Biopsy Bilateral. Breast Mass; Local Excision Left. Hysterectomy (not due to cancer) - Partial Knee Surgery Right. Oral Surgery Thyroid Surgery  Diagnostic Studies History  Colonoscopy 5-10 years ago Mammogram within last year  Allergies Chantix *PSYCHOTHERAPEUTIC AND NEUROLOGICAL AGENTS - MISC.* Ciprofloxacin *CHEMICALS* PenicillAMINE *ASSORTED CLASSES* Tamoxifen Citrate *ANTINEOPLASTICS AND ADJUNCTIVE THERAPIES* Lyrica *ANTICONVULSANTS* Sulfa Antibiotics Morphine Sulfate (Concentrate) *ANALGESICS - OPIOID* Neurontin *ANTICONVULSANTS*  Medication History  Temazepam (7.5MG  Capsule, Oral) Active. Cyclobenzaprine HCl (10MG  Tablet, Oral) Active. Fenofibrate (160MG  Tablet, Oral) Active. DULoxetine HCl (30MG  Capsule DR Part, Oral) Active. Levothyroxine Sodium (25MCG Tablet, Oral) Active. MetFORMIN HCl (500MG  Tablet, Oral) Active. Metoprolol Tartrate (50MG  Tablet, Oral) Active. Pataday (0.2% Solution, Ophthalmic) Active. ProAir HFA (108 (90 Base)MCG/ACT Aerosol Soln, Inhalation) Active. HydroCHLOROthiazide (25MG  Tablet, Oral) Active. Medications Reconciled  Pregnancy / Birth History  Age at menarche 48 years. Age of menopause 34-55 Gravida 5 Maternal age 74-20 Para 3    Review of Systems  General Present- Fatigue. Not Present- Appetite Loss, Chills, Fever, Night Sweats, Weight Gain and Weight Loss. Skin Not Present- Change in Wart/Mole, Dryness, Hives, Jaundice, New  Lesions, Non-Healing Wounds, Rash and Ulcer. HEENT Present- Hearing Loss, Hoarseness, Ringing in the Ears and Wears glasses/contact lenses. Not Present- Earache, Nose Bleed, Oral Ulcers, Seasonal Allergies, Sinus Pain, Sore Throat, Visual Disturbances and Yellow Eyes. Respiratory Present- Snoring. Not Present- Bloody sputum, Chronic Cough, Difficulty Breathing and Wheezing. Breast Present- Breast Mass. Not Present- Breast Pain, Nipple Discharge and Skin Changes. Cardiovascular Present- Leg Cramps. Not Present- Chest Pain, Difficulty Breathing Lying Down, Palpitations, Rapid Heart Rate, Shortness of Breath and Swelling of Extremities. Gastrointestinal Not Present- Abdominal Pain, Bloating, Bloody Stool, Change in Bowel Habits, Chronic diarrhea, Constipation, Difficulty Swallowing, Excessive gas, Gets full quickly at meals, Hemorrhoids, Indigestion, Nausea, Rectal Pain and Vomiting. Female Genitourinary Present- Pelvic Pain. Not Present- Frequency, Nocturia, Painful Urination and Urgency. Musculoskeletal Present- Back Pain, Joint Pain, Joint Stiffness and Muscle Pain. Not Present- Muscle Weakness and Swelling of Extremities. Neurological Present- Tingling. Not Present- Decreased Memory, Fainting, Headaches, Numbness, Seizures, Tremor, Trouble walking and Weakness. Psychiatric Present- Anxiety and Depression. Not Present- Bipolar, Change in Sleep Pattern, Fearful and Frequent crying. Endocrine Present- Cold Intolerance and Hot flashes. Not Present- Excessive Hunger, Hair Changes, Heat Intolerance and New Diabetes. Hematology Not Present- Easy Bruising, Excessive bleeding, Gland problems, HIV and Persistent Infections.  Vitals  Weight: 234 lb Height: 64in Body Surface Area: 2.09 m Body Mass Index: 40.17 kg/m  Temp.: 97.34F(Temporal)  Pulse: 80 (Regular)  BP: 130/72 (Sitting, Left Arm, Standard)    Physical Exam  General Mental Status-Alert. General Appearance-Consistent with  stated age. Hydration-Well hydrated. Voice-Normal. Note: BMI 40.17   Head and Neck Head-normocephalic, atraumatic with  no lesions or palpable masses. Trachea-midline. Thyroid Gland Characteristics - normal size and consistency.  Eye Eyeball - Bilateral-Extraocular movements intact. Sclera/Conjunctiva - Bilateral-No scleral icterus.  Chest and Lung Exam Chest and lung exam reveals -quiet, even and easy respiratory effort with no use of accessory muscles and on auscultation, normal breath sounds, no adventitious sounds and normal vocal resonance. Inspection Chest Wall - Normal. Back - normal.  Breast Note: Right breast reveals recent biopsy site but minimal ecchymoses. No hematoma. Left breast reveals transverse lumpectomy scar superiorly with a little bit of soft tissue defect. Breasts are relatively small compared to her body habitus. I don't feel any mass in either breast. No other skin changes. No nipple discharge. No axillary adenopathy.   Cardiovascular Cardiovascular examination reveals -normal heart sounds, regular rate and rhythm with no murmurs and normal pedal pulses bilaterally.  Abdomen Inspection Inspection of the abdomen reveals - No Hernias. Skin - Scar - no surgical scars. Palpation/Percussion Palpation and Percussion of the abdomen reveal - Soft, Non Tender, No Rebound tenderness, No Rigidity (guarding) and No hepatosplenomegaly. Auscultation Auscultation of the abdomen reveals - Bowel sounds normal.  Neurologic Neurologic evaluation reveals -alert and oriented x 3 with no impairment of recent or remote memory. Mental Status-Normal.  Musculoskeletal Normal Exam - Left-Upper Extremity Strength Normal and Lower Extremity Strength Normal. Normal Exam - Right-Upper Extremity Strength Normal and Lower Extremity Strength Normal.  Lymphatic Head & Neck  General Head & Neck Lymphatics: Bilateral - Description -  Normal. Axillary  General Axillary Region: Bilateral - Description - Normal. Tenderness - Non Tender. Femoral & Inguinal  Generalized Femoral & Inguinal Lymphatics: Bilateral - Description - Normal. Tenderness - Non Tender.    Assessment & Plan CANCER OF CENTRAL PORTION OF RIGHT FEMALE BREAST (C50.111) Your recent imaging studies and biopsies showed intermediate to high-grade ductal carcinoma in situ of the right breast centrally. There is a second area of suspicious calcifications more medially. Hormone receptor analysis is pending. We have discussed the options of lumpectomy and mastectomy Because of your family history of breast cancer, and your prior history of cancer in the opposite breast, you have already made the decision that you would like to proceed with bilateral total mastectomies. That is reasonable considering your individual situation. We discussed the possibility of immediate and delayed reconstruction, and you state that you are not interested in reconstruction at this time.  You will be referred back to Medical Oncology.. You will be referred for genetic counseling and genetic testing. You have 2 daughters. If your genetic testing is positive and they should also be tested.   You will be scheduled for right total mastectomy, right axillary sentinel node biopsy, left prophylactic total mastectomy in the near future. I discussed the indications, techniques, and numerous risk of this surgery in detail with you and your husband.  Please read the printed information that we have given you.  HISTORY OF LEFT BREAST CANCER (Z85.3)  FAMILY HISTORY OF BREAST CANCER IN FEMALE (Z80.3) Impression: Maternal aunt died in her 58s of metastatic breast cancer. Sister diagnosis of ductal carcinoma in situ in her 59s.  SLEEP APNEA IN ADULT (G47.30) BMI 40.0-44.9, ADULT (Z68.41) TOBACCO ABUSE (Z72.0) TYPE 2 DIABETES MELLITUS TREATED WITHOUT INSULIN (E11.9) COPD, MILD (J44.9) LATENT  TUBERCULOSIS BY BLOOD TEST (R76.11) Impression: Completing 9 month course of isoniazid. Followed by Dr. Lamonte Sakai. HYPERTENSION, BENIGN (I10) CHRONIC GERD (K21.9) HISTORY OF THYROIDECTOMY (E89.0)    Ariany Kesselman M. Dalbert Batman, M.D., [facility] General  and Minimally invasive Surgery Breast and Colorectal Surgery Office:   [phone] Pager:   [phone]

## 2015-10-24 NOTE — Assessment & Plan Note (Signed)
Planning for surgery this week

## 2015-10-24 NOTE — Patient Instructions (Addendum)
Congratulations on decreasing your smoking. Continue to work on setting a quit date Take albuterol 2 puffs up to every 4 hours if needed for shortness of breath.  Finish the current bottle of isoniazid and then stop.  We will repeat your CT chest this year. We will discuss the timing of this at your next visit Follow with Dr Lamonte Sakai in 3 months or sooner if you have any problems.

## 2015-10-24 NOTE — Assessment & Plan Note (Signed)
Clinical stable at this time. Uses albuterol when necessary. Defer a schedule bronchodilator this time. Important issue for [place] now is complete smoking cessation.

## 2015-10-25 MED ORDER — VANCOMYCIN HCL 10 G IV SOLR
1500.0000 mg | INTRAVENOUS | Status: AC
  Administered 2015-10-26: 1500 mg via INTRAVENOUS
  Filled 2015-10-25: qty 1500

## 2015-10-25 MED ORDER — ACETAMINOPHEN 500 MG PO TABS
1000.0000 mg | ORAL_TABLET | ORAL | Status: AC
  Administered 2015-10-26: 1000 mg via ORAL
  Filled 2015-10-25: qty 2

## 2015-10-25 MED ORDER — CELECOXIB 200 MG PO CAPS
400.0000 mg | ORAL_CAPSULE | ORAL | Status: AC
  Administered 2015-10-26: 400 mg via ORAL
  Filled 2015-10-25: qty 2

## 2015-10-26 ENCOUNTER — Ambulatory Visit (HOSPITAL_COMMUNITY): Payer: 59

## 2015-10-26 ENCOUNTER — Ambulatory Visit (HOSPITAL_COMMUNITY)
Admission: RE | Admit: 2015-10-26 | Discharge: 2015-10-27 | Disposition: A | Discharge status: Home / Self Care | Payer: 59 | Source: Ambulatory Visit | Attending: General Surgery | Admitting: General Surgery

## 2015-10-26 ENCOUNTER — Ambulatory Visit (HOSPITAL_COMMUNITY): Payer: 59 | Admitting: Emergency Medicine

## 2015-10-26 ENCOUNTER — Ambulatory Visit (HOSPITAL_COMMUNITY): Payer: 59 | Admitting: Anesthesiology

## 2015-10-26 ENCOUNTER — Encounter (HOSPITAL_COMMUNITY): Payer: Self-pay | Admitting: Anesthesiology

## 2015-10-26 ENCOUNTER — Encounter (HOSPITAL_COMMUNITY)
Admission: RE | Disposition: A | Discharge status: Home / Self Care | Payer: Self-pay | Source: Ambulatory Visit | Attending: General Surgery

## 2015-10-26 DIAGNOSIS — J449 Chronic obstructive pulmonary disease, unspecified: Secondary | ICD-10-CM | POA: Diagnosis not present

## 2015-10-26 DIAGNOSIS — Z853 Personal history of malignant neoplasm of breast: Secondary | ICD-10-CM | POA: Insufficient documentation

## 2015-10-26 DIAGNOSIS — C50111 Malignant neoplasm of central portion of right female breast: Secondary | ICD-10-CM

## 2015-10-26 DIAGNOSIS — Z7984 Long term (current) use of oral hypoglycemic drugs: Secondary | ICD-10-CM | POA: Insufficient documentation

## 2015-10-26 DIAGNOSIS — Z803 Family history of malignant neoplasm of breast: Secondary | ICD-10-CM | POA: Insufficient documentation

## 2015-10-26 DIAGNOSIS — Z923 Personal history of irradiation: Secondary | ICD-10-CM | POA: Diagnosis not present

## 2015-10-26 DIAGNOSIS — G4733 Obstructive sleep apnea (adult) (pediatric): Secondary | ICD-10-CM | POA: Insufficient documentation

## 2015-10-26 DIAGNOSIS — Z86718 Personal history of other venous thrombosis and embolism: Secondary | ICD-10-CM

## 2015-10-26 DIAGNOSIS — M797 Fibromyalgia: Secondary | ICD-10-CM | POA: Insufficient documentation

## 2015-10-26 DIAGNOSIS — Z79899 Other long term (current) drug therapy: Secondary | ICD-10-CM | POA: Insufficient documentation

## 2015-10-26 DIAGNOSIS — K219 Gastro-esophageal reflux disease without esophagitis: Secondary | ICD-10-CM | POA: Insufficient documentation

## 2015-10-26 DIAGNOSIS — M1990 Unspecified osteoarthritis, unspecified site: Secondary | ICD-10-CM | POA: Diagnosis not present

## 2015-10-26 DIAGNOSIS — I10 Essential (primary) hypertension: Secondary | ICD-10-CM | POA: Diagnosis not present

## 2015-10-26 DIAGNOSIS — E669 Obesity, unspecified: Secondary | ICD-10-CM | POA: Diagnosis present

## 2015-10-26 DIAGNOSIS — C50411 Malignant neoplasm of upper-outer quadrant of right female breast: Secondary | ICD-10-CM | POA: Diagnosis present

## 2015-10-26 DIAGNOSIS — E119 Type 2 diabetes mellitus without complications: Secondary | ICD-10-CM | POA: Insufficient documentation

## 2015-10-26 DIAGNOSIS — G4730 Sleep apnea, unspecified: Secondary | ICD-10-CM

## 2015-10-26 HISTORY — PX: MASTECTOMY W/ SENTINEL NODE BIOPSY: SHX2001

## 2015-10-26 LAB — CREATININE, SERUM
Creatinine, Ser: 0.85 mg/dL (ref 0.44–1.00)
GFR calc Af Amer: >60 mL/min (ref >60)
GFR calc non Af Amer: >60 mL/min (ref >60)

## 2015-10-26 LAB — CBC
HEMATOCRIT: 36 % (ref 36.0–46.0)
HEMOGLOBIN: 11.9 g/dL — AB (ref 12.0–15.0)
MCH: 27.8 pg (ref 26.0–34.0)
MCHC: 33.1 g/dL (ref 30.0–36.0)
MCV: 84.1 fL (ref 78.0–100.0)
Platelets: 199 10*3/uL (ref 150–400)
RBC: 4.28 MIL/uL (ref 3.87–5.11)
RDW: 13 % (ref 11.5–15.5)
WBC: 4.3 10*3/uL (ref 4.0–10.5)

## 2015-10-26 LAB — GLUCOSE, CAPILLARY
GLUCOSE-CAPILLARY: 128 mg/dL — AB (ref 65–99)
Glucose-Capillary: 150 mg/dL — ABNORMAL HIGH (ref 65–99)

## 2015-10-26 SURGERY — MASTECTOMY WITH SENTINEL LYMPH NODE BIOPSY
Anesthesia: General | Site: Breast | Laterality: Bilateral

## 2015-10-26 MED ORDER — FENTANYL CITRATE (PF) 100 MCG/2ML IJ SOLN
INTRAMUSCULAR | Status: AC
  Filled 2015-10-26: qty 2

## 2015-10-26 MED ORDER — LEVOTHYROXINE SODIUM 25 MCG PO TABS
25.0000 ug | ORAL_TABLET | Freq: Every day | ORAL | Status: DC
  Administered 2015-10-27: 25 ug via ORAL
  Filled 2015-10-26: qty 1

## 2015-10-26 MED ORDER — FENTANYL CITRATE (PF) 250 MCG/5ML IJ SOLN
INTRAMUSCULAR | Status: AC
  Filled 2015-10-26: qty 5

## 2015-10-26 MED ORDER — CHLORHEXIDINE GLUCONATE 4 % EX LIQD: 60.0000 mL | Freq: Once | CUTANEOUS | Status: DC

## 2015-10-26 MED ORDER — ONDANSETRON 4 MG PO TBDP: 4.0000 mg | ORAL_TABLET | Freq: Four times a day (QID) | ORAL | Status: DC | PRN

## 2015-10-26 MED ORDER — FENTANYL CITRATE (PF) 100 MCG/2ML IJ SOLN
25.0000 ug | INTRAMUSCULAR | Status: DC | PRN
  Administered 2015-10-26: 25 ug via INTRAVENOUS
  Administered 2015-10-26: 50 ug via INTRAVENOUS

## 2015-10-26 MED ORDER — ONDANSETRON HCL 4 MG/2ML IJ SOLN: 4.0000 mg | Freq: Once | INTRAMUSCULAR | Status: DC | PRN

## 2015-10-26 MED ORDER — ADULT MULTIVITAMIN W/MINERALS CH
1.0000 | ORAL_TABLET | Freq: Every day | ORAL | Status: DC
  Administered 2015-10-26 – 2015-10-27 (×2): 1 via ORAL
  Filled 2015-10-26 (×3): qty 1

## 2015-10-26 MED ORDER — MIDAZOLAM HCL 5 MG/5ML IJ SOLN
INTRAMUSCULAR | Status: DC | PRN
  Administered 2015-10-26: 2 mg via INTRAVENOUS

## 2015-10-26 MED ORDER — LACTATED RINGERS IV SOLN
INTRAVENOUS | Status: DC | PRN
  Administered 2015-10-26 (×2): via INTRAVENOUS

## 2015-10-26 MED ORDER — SODIUM CHLORIDE 0.9 % IJ SOLN
INTRAVENOUS | Status: DC | PRN
  Administered 2015-10-26: 5 mL via INTRAMUSCULAR

## 2015-10-26 MED ORDER — TECHNETIUM TC 99M SULFUR COLLOID FILTERED
1.0000 | Freq: Once | INTRAVENOUS | Status: AC | PRN
  Administered 2015-10-26: 1 via INTRADERMAL

## 2015-10-26 MED ORDER — MIDAZOLAM HCL 2 MG/2ML IJ SOLN
INTRAMUSCULAR | Status: AC
  Filled 2015-10-26: qty 2

## 2015-10-26 MED ORDER — 0.9 % SODIUM CHLORIDE (POUR BTL) OPTIME
TOPICAL | Status: DC | PRN
  Administered 2015-10-26 (×3): 1000 mL

## 2015-10-26 MED ORDER — ENOXAPARIN SODIUM 40 MG/0.4ML ~~LOC~~ SOLN
40.0000 mg | SUBCUTANEOUS | Status: DC
  Administered 2015-10-27: 40 mg via SUBCUTANEOUS
  Filled 2015-10-26: qty 0.4

## 2015-10-26 MED ORDER — LIDOCAINE HCL (CARDIAC) 20 MG/ML IV SOLN
INTRAVENOUS | Status: DC | PRN
  Administered 2015-10-26: 100 mg via INTRAVENOUS

## 2015-10-26 MED ORDER — METOPROLOL TARTRATE 50 MG PO TABS
50.0000 mg | ORAL_TABLET | Freq: Two times a day (BID) | ORAL | Status: DC
  Administered 2015-10-26 – 2015-10-27 (×2): 50 mg via ORAL
  Filled 2015-10-26 (×2): qty 1

## 2015-10-26 MED ORDER — ROCURONIUM BROMIDE 100 MG/10ML IV SOLN
INTRAVENOUS | Status: DC | PRN
  Administered 2015-10-26: 50 mg via INTRAVENOUS

## 2015-10-26 MED ORDER — HYDROCODONE-ACETAMINOPHEN 5-325 MG PO TABS
1.0000 | ORAL_TABLET | ORAL | Status: DC | PRN
  Administered 2015-10-27: 1 via ORAL
  Filled 2015-10-26: qty 1

## 2015-10-26 MED ORDER — PANTOPRAZOLE SODIUM 40 MG PO TBEC
40.0000 mg | DELAYED_RELEASE_TABLET | Freq: Every day | ORAL | Status: DC
  Administered 2015-10-27: 40 mg via ORAL
  Filled 2015-10-26: qty 1

## 2015-10-26 MED ORDER — SODIUM CHLORIDE 0.9 % IJ SOLN
INTRAMUSCULAR | Status: AC
  Filled 2015-10-26: qty 10

## 2015-10-26 MED ORDER — ALBUTEROL SULFATE (2.5 MG/3ML) 0.083% IN NEBU: 3.0000 mL | INHALATION_SOLUTION | RESPIRATORY_TRACT | Status: DC | PRN

## 2015-10-26 MED ORDER — TEMAZEPAM 7.5 MG PO CAPS: 7.5000 mg | ORAL_CAPSULE | Freq: Every evening | ORAL | Status: DC | PRN

## 2015-10-26 MED ORDER — CYCLOBENZAPRINE HCL 10 MG PO TABS: 10.0000 mg | ORAL_TABLET | Freq: Three times a day (TID) | ORAL | Status: DC | PRN

## 2015-10-26 MED ORDER — METFORMIN HCL 500 MG PO TABS
500.0000 mg | ORAL_TABLET | Freq: Two times a day (BID) | ORAL | Status: DC
  Administered 2015-10-26 – 2015-10-27 (×2): 500 mg via ORAL
  Filled 2015-10-26 (×2): qty 1

## 2015-10-26 MED ORDER — METHYLENE BLUE 0.5 % INJ SOLN
INTRAVENOUS | Status: AC
  Filled 2015-10-26: qty 10

## 2015-10-26 MED ORDER — ONDANSETRON HCL 4 MG/2ML IJ SOLN
INTRAMUSCULAR | Status: DC | PRN
  Administered 2015-10-26: 4 mg via INTRAVENOUS

## 2015-10-26 MED ORDER — HYDROCHLOROTHIAZIDE 25 MG PO TABS
25.0000 mg | ORAL_TABLET | Freq: Every day | ORAL | Status: DC
  Administered 2015-10-26 – 2015-10-27 (×2): 25 mg via ORAL
  Filled 2015-10-26 (×3): qty 1

## 2015-10-26 MED ORDER — IBUPROFEN 800 MG PO TABS
800.0000 mg | ORAL_TABLET | Freq: Every day | ORAL | Status: DC
  Administered 2015-10-26: 800 mg via ORAL
  Filled 2015-10-26: qty 1

## 2015-10-26 MED ORDER — SENNA 8.6 MG PO TABS
1.0000 | ORAL_TABLET | Freq: Two times a day (BID) | ORAL | Status: DC
  Administered 2015-10-26 – 2015-10-27 (×3): 8.6 mg via ORAL
  Filled 2015-10-26 (×3): qty 1

## 2015-10-26 MED ORDER — DEXAMETHASONE SODIUM PHOSPHATE 4 MG/ML IJ SOLN
INTRAMUSCULAR | Status: DC | PRN
  Administered 2015-10-26: 10 mg via INTRAVENOUS

## 2015-10-26 MED ORDER — VITAMIN D 1000 UNITS PO TABS
1000.0000 [IU] | ORAL_TABLET | Freq: Every day | ORAL | Status: DC
  Administered 2015-10-26 – 2015-10-27 (×2): 1000 [IU] via ORAL
  Filled 2015-10-26 (×2): qty 1

## 2015-10-26 MED ORDER — SUGAMMADEX SODIUM 200 MG/2ML IV SOLN
INTRAVENOUS | Status: DC | PRN
Start: 2015-10-26 — End: 2015-10-26
  Administered 2015-10-26 (×2): 100 mg via INTRAVENOUS

## 2015-10-26 MED ORDER — FENTANYL CITRATE (PF) 100 MCG/2ML IJ SOLN
25.0000 ug | INTRAMUSCULAR | Status: DC | PRN
  Administered 2015-10-26: 25 ug via INTRAVENOUS
  Filled 2015-10-26: qty 2

## 2015-10-26 MED ORDER — ISONIAZID 300 MG PO TABS
300.0000 mg | ORAL_TABLET | Freq: Every day | ORAL | Status: DC
  Administered 2015-10-27: 300 mg via ORAL
  Filled 2015-10-26 (×2): qty 1

## 2015-10-26 MED ORDER — FENTANYL CITRATE (PF) 100 MCG/2ML IJ SOLN
INTRAMUSCULAR | Status: DC | PRN
  Administered 2015-10-26: 100 ug via INTRAVENOUS
  Administered 2015-10-26 (×3): 50 ug via INTRAVENOUS

## 2015-10-26 MED ORDER — ONDANSETRON HCL 4 MG/2ML IJ SOLN: 4.0000 mg | Freq: Four times a day (QID) | INTRAMUSCULAR | Status: DC | PRN

## 2015-10-26 MED ORDER — METHOCARBAMOL 500 MG PO TABS: 500.0000 mg | ORAL_TABLET | Freq: Four times a day (QID) | ORAL | Status: DC | PRN

## 2015-10-26 MED ORDER — LACTATED RINGERS IV SOLN
INTRAVENOUS | Status: DC
  Administered 2015-10-26: 14:00:00 via INTRAVENOUS
  Filled 2015-10-26 (×5): qty 1000

## 2015-10-26 MED ORDER — DULOXETINE HCL 60 MG PO CPEP
90.0000 mg | ORAL_CAPSULE | Freq: Every day | ORAL | Status: DC
  Administered 2015-10-27: 90 mg via ORAL
  Filled 2015-10-26: qty 1

## 2015-10-26 MED ORDER — FENOFIBRATE 160 MG PO TABS
160.0000 mg | ORAL_TABLET | Freq: Every day | ORAL | Status: DC
  Administered 2015-10-26 – 2015-10-27 (×2): 160 mg via ORAL
  Filled 2015-10-26 (×2): qty 1

## 2015-10-26 MED ORDER — PROPOFOL 10 MG/ML IV BOLUS
INTRAVENOUS | Status: DC | PRN
  Administered 2015-10-26: 200 mg via INTRAVENOUS

## 2015-10-26 SURGICAL SUPPLY — 59 items
ADH SKN CLS APL DERMABOND .7 (GAUZE/BANDAGES/DRESSINGS) ×2
APPLIER CLIP 9.375 MED OPEN (MISCELLANEOUS) ×2
APR CLP MED 9.3 20 MLT OPN (MISCELLANEOUS) ×1
BINDER BREAST XLRG (GAUZE/BANDAGES/DRESSINGS) ×1 IMPLANT
CANISTER SUCTION 2500CC (MISCELLANEOUS) ×2 IMPLANT
CHLORAPREP W/TINT 26ML (MISCELLANEOUS) ×2 IMPLANT
CLIP APPLIE 9.375 MED OPEN (MISCELLANEOUS) ×1 IMPLANT
CONT SPEC 4OZ CLIKSEAL STRL BL (MISCELLANEOUS) ×2 IMPLANT
CONT SPECI 4OZ STER CLIK (MISCELLANEOUS) ×2 IMPLANT
COVER PROBE W GEL 5X96 (DRAPES) ×2 IMPLANT
COVER SURGICAL LIGHT HANDLE (MISCELLANEOUS) ×2 IMPLANT
DERMABOND ADVANCED (GAUZE/BANDAGES/DRESSINGS) ×2
DERMABOND ADVANCED .7 DNX12 (GAUZE/BANDAGES/DRESSINGS) ×1 IMPLANT
DRAIN CHANNEL 19F RND (DRAIN) ×4 IMPLANT
DRAPE CHEST BREAST 15X10 FENES (DRAPES) ×2 IMPLANT
DRAPE PROXIMA HALF (DRAPES) ×2 IMPLANT
DRAPE UTILITY XL STRL (DRAPES) ×3 IMPLANT
DRSG PAD ABDOMINAL 8X10 ST (GAUZE/BANDAGES/DRESSINGS) ×2 IMPLANT
ELECT BLADE 4.0 EZ CLEAN MEGAD (MISCELLANEOUS) ×2
ELECT CAUTERY BLADE 6.4 (BLADE) ×2 IMPLANT
ELECT REM PT RETURN 9FT ADLT (ELECTROSURGICAL) ×2
ELECTRODE BLDE 4.0 EZ CLN MEGD (MISCELLANEOUS) ×1 IMPLANT
ELECTRODE REM PT RTRN 9FT ADLT (ELECTROSURGICAL) ×1 IMPLANT
EVACUATOR SILICONE 100CC (DRAIN) ×4 IMPLANT
GAUZE SPONGE 4X4 12PLY STRL (GAUZE/BANDAGES/DRESSINGS) ×1 IMPLANT
GLOVE BIO SURGEON STRL SZ 6.5 (GLOVE) ×2 IMPLANT
GLOVE BIO SURGEON STRL SZ7 (GLOVE) ×1 IMPLANT
GLOVE BIOGEL PI IND STRL 7.0 (GLOVE) IMPLANT
GLOVE BIOGEL PI IND STRL 8.5 (GLOVE) IMPLANT
GLOVE BIOGEL PI INDICATOR 7.0 (GLOVE) ×1
GLOVE BIOGEL PI INDICATOR 8.5 (GLOVE) ×1
GLOVE EUDERMIC 7 POWDERFREE (GLOVE) ×2 IMPLANT
GOWN STRL REUS W/ TWL LRG LVL3 (GOWN DISPOSABLE) ×2 IMPLANT
GOWN STRL REUS W/ TWL XL LVL3 (GOWN DISPOSABLE) ×1 IMPLANT
GOWN STRL REUS W/TWL LRG LVL3 (GOWN DISPOSABLE) ×6
GOWN STRL REUS W/TWL XL LVL3 (GOWN DISPOSABLE) ×2
ILLUMINATOR WAVEGUIDE N/F (MISCELLANEOUS) IMPLANT
KIT BASIN OR (CUSTOM PROCEDURE TRAY) ×2 IMPLANT
KIT ROOM TURNOVER OR (KITS) ×2 IMPLANT
LIGHT WAVEGUIDE WIDE FLAT (MISCELLANEOUS) IMPLANT
NDL 18GX1X1/2 (RX/OR ONLY) (NEEDLE) ×1 IMPLANT
NDL FILTER BLUNT 18X1 1/2 (NEEDLE) IMPLANT
NDL HYPO 25GX1X1/2 BEV (NEEDLE) ×1 IMPLANT
NEEDLE 18GX1X1/2 (RX/OR ONLY) (NEEDLE) ×2 IMPLANT
NEEDLE FILTER BLUNT 18X 1/2SAF (NEEDLE) ×1
NEEDLE FILTER BLUNT 18X1 1/2 (NEEDLE) ×1 IMPLANT
NEEDLE HYPO 25GX1X1/2 BEV (NEEDLE) ×2 IMPLANT
NS IRRIG 1000ML POUR BTL (IV SOLUTION) ×2 IMPLANT
PACK GENERAL/GYN (CUSTOM PROCEDURE TRAY) ×2 IMPLANT
PAD ARMBOARD 7.5X6 YLW CONV (MISCELLANEOUS) ×2 IMPLANT
SPECIMEN JAR X LARGE (MISCELLANEOUS) ×3 IMPLANT
SPONGE LAP 18X18 X RAY DECT (DISPOSABLE) ×2 IMPLANT
SUT ETHILON 3 0 FSL (SUTURE) ×5 IMPLANT
SUT MNCRL AB 4-0 PS2 18 (SUTURE) ×4 IMPLANT
SUT SILK 2 0 FS (SUTURE) ×3 IMPLANT
SUT VIC AB 3-0 SH 18 (SUTURE) ×5 IMPLANT
SYR CONTROL 10ML LL (SYRINGE) ×2 IMPLANT
TOWEL OR 17X26 10 PK STRL BLUE (TOWEL DISPOSABLE) ×2 IMPLANT
TRAY FOLEY W/METER SILVER 16FR (SET/KITS/TRAYS/PACK) ×1 IMPLANT

## 2015-10-26 NOTE — Anesthesia Procedure Notes (Addendum)
Anesthesia Regional Block:  Pectoralis block  Pre-Anesthetic Checklist: ,, timeout performed, Correct Patient, Correct Site, Correct Laterality, Correct Procedure, Correct Position, site marked, Risks and benefits discussed,  Surgical consent,  Pre-op evaluation,  At surgeon's request and post-op pain management  Laterality: Right  Prep: chloraprep       Needles:  Injection technique: Single-shot  Needle Type: Echogenic Stimulator Needle     Needle Length: 9cm 9 cm Needle Gauge: 21 and 21 G    Additional Needles:  Procedures: ultrasound guided (picture in chart) Pectoralis block Narrative:  Start time: 10/26/2015 7:55 AM End time: 10/26/2015 8:00 AM Injection made incrementally with aspirations every 5 mL.  Performed by: Personally   Additional Notes: 20 cc 0.5% Bupivacaine with 1:200 epi injected easily   Anesthesia Regional Block:  Pectoralis block  Pre-Anesthetic Checklist: ,, timeout performed, Correct Patient, Correct Site, Correct Laterality, Correct Procedure, Correct Position, site marked, Risks and benefits discussed,  Surgical consent,  Pre-op evaluation,  At surgeon's request and post-op pain management  Laterality: Left  Prep: chloraprep       Needles:  Injection technique: Single-shot  Needle Type: Echogenic Stimulator Needle     Needle Length: 9cm 9 cm Needle Gauge: 21 and 21 G    Additional Needles:  Procedures: ultrasound guided (picture in chart) Pectoralis block Narrative:  Start time: 10/26/2015 8:00 AM End time: 10/26/2015 8:05 AM Injection made incrementally with aspirations every 5 mL.  Performed by: Personally   Additional Notes: 20 cc 0.5% Bupivacaine with 1:200 Epi injected easily   Procedure Name: Intubation Date/Time: 10/26/2015 8:33 AM Performed by: Lieutenant Diego Pre-anesthesia Checklist: Patient identified, Emergency Drugs available, Suction available and Patient being monitored Patient Re-evaluated:Patient Re-evaluated  prior to inductionOxygen Delivery Method: Circle system utilized Preoxygenation: Pre-oxygenation with 100% oxygen Intubation Type: IV induction Ventilation: Mask ventilation without difficulty Laryngoscope Size: Miller and 2 Grade View: Grade II Tube type: Oral Number of attempts: 1 Airway Equipment and Method: Stylet and Oral airway Placement Confirmation: ETT inserted through vocal cords under direct vision,  positive ETCO2 and breath sounds checked- equal and bilateral Secured at: 22 cm Tube secured with: Tape Dental Injury: Teeth and Oropharynx as per pre-operative assessment

## 2015-10-26 NOTE — Anesthesia Postprocedure Evaluation (Signed)
Anesthesia Post Note  Patient: Susan [name]  Procedure(s) Performed: Procedure(s) (LRB): RIGHT TOTAL MASTECTOMY WITH RIGHT SENTINEL LYMPH NODE BIOPSY, INJECT BLUE DYE RIGHT BREAST, LEFT BREAST PROPHYLACTIC MASTECTOMY (Bilateral)  Patient location during evaluation: PACU Anesthesia Type: General Level of consciousness: awake, awake and alert and oriented Pain management: pain level controlled Vital Signs Assessment: post-procedure vital signs reviewed and stable Respiratory status: spontaneous breathing, nonlabored ventilation and respiratory function stable Cardiovascular status: blood pressure returned to baseline Anesthetic complications: no    Last Vitals:  Filed Vitals:   10/26/15 1218 10/26/15 1309  BP: 134/70 138/65  Pulse: 78 81  Temp:  36.3 C  Resp: 16 16    Last Pain:  Filed Vitals:   10/26/15 1653  PainSc: Asleep                 Shion Bluestein COKER

## 2015-10-26 NOTE — Anesthesia Preprocedure Evaluation (Addendum)
Anesthesia Evaluation  Patient identified by MRN, date of birth, ID band Patient awake    Reviewed: Allergy & Precautions, NPO status , Patient's Chart, lab work & pertinent test results  Airway Mallampati: II  TM Distance: <3 FB Neck ROM: limited    Dental  (+) Teeth Intact, Dental Advidsory Given   Pulmonary shortness of breath and with exertion, sleep apnea , pneumonia, resolved, COPD,  COPD inhaler, Current Smoker,    breath sounds clear to auscultation       Cardiovascular hypertension, Pt. on home beta blockers + Peripheral Vascular Disease  + dysrhythmias  Rhythm:regular     Neuro/Psych Anxiety Depression  Neuromuscular disease    GI/Hepatic GERD  Controlled and Medicated,  Endo/Other  diabetes, Well Controlled, Oral Hypoglycemic Agents  Renal/GU      Musculoskeletal  (+) Arthritis , Osteoarthritis,  Fibromyalgia -  Abdominal   Peds  Hematology   Anesthesia Other Findings   Reproductive/Obstetrics                          Anesthesia Physical Anesthesia Plan  ASA: II  Anesthesia Plan: General and Regional   Post-op Pain Management:    Induction: Intravenous  Airway Management Planned: Oral ETT  Additional Equipment:   Intra-op Plan:   Post-operative Plan: Extubation in OR  Informed Consent: I have reviewed the patients History and Physical, chart, labs and discussed the procedure including the risks, benefits and alternatives for the proposed anesthesia with the patient or authorized representative who has indicated his/her understanding and acceptance.   Dental advisory given  Plan Discussed with: CRNA and Anesthesiologist  Anesthesia Plan Comments:         Anesthesia Quick Evaluation

## 2015-10-26 NOTE — Interval H&P Note (Signed)
History and Physical Interval Note:  10/26/2015 7:47 AM  Susan [name]  has presented today for surgery, with the diagnosis of RIGHT BREAST CANCER  The various methods of treatment have been discussed with the patient and family. After consideration of risks, benefits and other options for treatment, the patient has consented to  Procedure(s): RIGHT TOTAL MASTECTOMY WITH RIGHT SENTINEL LYMPH NODE BIOPSY, INJECT BLUE DYE RIGHT BREAST, LEFT BREAST PROPHYLACTIC MASTECTOMY (Bilateral) as a surgical intervention .  The patient's history has been reviewed, patient examined, no change in status, stable for surgery.  I have reviewed the patient's chart and labs.  Questions were answered to the patient's satisfaction.     Adin Hector

## 2015-10-26 NOTE — Op Note (Signed)
Patient Name:           Susan [name]   Date of Surgery:        10/26/2015  Pre op Diagnosis:      Cancer central portion right breast                                      History left breasycancer  Post op Diagnosis:    same  Procedure:                 Inject blue dye right breast                                      Right total mastectomy                                      Right axillary sentinel lymph node biopsy                                      Left prophylactic total mastectomy  Surgeon:                     Edsel Petrin. Dalbert Batman, M.D., FACS  Assistant:                      RNFA  Operative Indications:    This is a 65 year old woman, referred by Dr. Almyra [facility] because of right breast cancer. She has a prior history of left breast cancer treated by Dr. Marylene Buerger and Dr. Marko Plume. Dr. [name] is her PCP. Dr. Baltazar Apo is her pulmonologist.  In 2004 she underwent left lumpectomy and adjuvant radiation therapy for ductal carcinoma in situ left breast. She took tamoxifen for 3 weeks and developed deep venous thrombosis and that was discontinued. She did well thereafter.  She gets annual screening mammograms. Recent screening mammograms show suspicious calcifications in the right breast upper outer quadrant and centrally, spending 3 cm x 1.9 cm. She went back for image guided biopsies and they discussed 2 areas. They biopsied a lateral area which showed intermediate to high-grade ductal carcinoma in situ, breast diagnostic profile pending. The also stated that there was an area of calcifications more medially and that if breast conservation was desired that this area should also be biopsied. I have reviewed her mammograms and regionally they are very dense and difficult to evaluate.  The patient states that she has decided to have bilateral mastectomies because she now has bilateral breast cancer and has a strong family history. I  told her that was reasonable and we talked about this extensively.  Family history reveals maternal aunt died in her 50s of stage IV breast cancer. She has a sister who developed carcinoma in situ in her 16s but died of lymphoma. No ovarian or pancreatic cancer in the family.  She has moderately significant comorbidities including BMI of 40, non-insulin-dependent diabetes, COPD, ongoing tobacco abuse, obstructive sleep apnea, GERD, hypertension, history thyroidectomy and taking Synthroid.  social history reveals that she has 2 daughters ages 65 and 9. Nobody has had genetic testing. One son.  She is retired. Smokes a half a pack of cigarettes per day denies alcohol. Trying to quit tobacco.  We had a long discussion. She is not interested in reconstruction now or later. I told her that I would advise her to undergo right total mastectomy with right axillary sentinel node biopsy in case she had invasive disease, left total mastectomy. I described the incisions and the attempts that we will make to avoid dog ears both medially and laterally although this may be a little bit tricky because of her large body habitus laterally. She is accepting of all of that. She does not want reconstruction.  She has had genetic counseling and genetic testing and that is negative for deleterious mutations.     She has seen Dr. Lindi Adie for a preop medical oncology consultation and will be referred back to him postop.  She'll be scheduled for injection of blue dye right best breast, right total mastectomy, right axillary sentinel node biopsy, left prophylactic total mastectomy.  Operative Findings:       The patient underwent injection of technetium 99 radionuclide into the right breast in the preop holding area by the nuclear medicine technician.  She underwent bilateral pectoral block in the holding area by Dr. Verdie Drown of the anesthesia department.   The left breast showed some treatment  effect with increased fibrosis.  There was no gross cancer within either breast.  I found 2 sentinel lymph nodes in the right axilla.  Both mastectomy specimens were marked with a silk suture to mark the lateral skin margin  Procedure in Detail:          Following the induction of general endotracheal anesthesia, a Foley catheter was inserted.  Surgical timeout was performed.  Intravenous antibiotics were given.  Following alcohol prep I injected 5 mL of dilute methylene blue into the right breast, subareolar area and massaged the breast for a few minutes.  Both breasts and axilla and upper arm were then prepped and draped in a sterile fashion.    Using a marking pin I carefully marked a transverse elliptical incisions which encompassed the nipple and areola and also the prior lumpectomy scar on the left side.  I performed a left prophylactic mastectomy first by making a transverse elliptical incision and raising skin flaps superiorly to the infraclavicular area, medially to the parasternal area, inferiorly to the inframammary crease and laterally to the anterior border of the latissimus dorsi muscle.  The left breast was dissected off of the pectoralis major and minor muscles.  Lateral skin margin marked with a silk suture and the specimen was sent to the lab with appropriate history attached.  Left mastectomy wound was irrigated with saline.  Hemostasis was excellent and achieved electrocautery and a few metal clips.  This wound was packed off.      On the right side a mirror image transverse elliptical incision was made.  Skin flaps were raised in similar fashion.  The breast and the axillary tail were dissected off of the pectoralis major and minor muscles.  The lateral skin margin was marked.  Using the neoprobe I went up into the axilla and found two obvious sentinel lymph nodes.  Both were very hot and somewhat blue.  After these 2 lymph nodes were removed there was no more radioactivity or blue dye.  The  mastectomy specimen was sent to the lab with appropriate history attached.  The wound was irrigated with saline.  Hemostasis was excellent.  I placed 2 drains  on the right one up to the axilla and one across the skin flaps and one drain on the left.  These were brought through separate stab incisions inferolaterally, sutured to the skin with nylon suture and connected to suction bulbs.  Both incisions were then closed.  An inner layer of interrupted 3-0 Vicryl to close the subcutaneous tissue at the skin was closed with a running subcuticular 4-0 Monocryl and Dermabond.  The closures looked good.  The wounds were covered with 4 x 4's, ABDs and a breast binder.  The patient tolerated the procedure well was taken to PACU in stable condition.  EBL 150 mL or less.  Counts correct.  Complications none.     Edsel Petrin. Dalbert Batman, M.D., FACS General and Minimally Invasive Surgery Breast and Colorectal Surgery  10/26/2015 10:38 AM

## 2015-10-26 NOTE — Transfer of Care (Signed)
Immediate Anesthesia Transfer of Care Note  Patient: Susan [name]  Procedure(s) Performed: Procedure(s): RIGHT TOTAL MASTECTOMY WITH RIGHT SENTINEL LYMPH NODE BIOPSY, INJECT BLUE DYE RIGHT BREAST, LEFT BREAST PROPHYLACTIC MASTECTOMY (Bilateral)  Patient Location: PACU  Anesthesia Type:General  Level of Consciousness: awake and alert   Airway & Oxygen Therapy: Patient Spontanous Breathing and Patient connected to face mask oxygen  Post-op Assessment: Report given to RN and Post -op Vital signs reviewed and stable  Post vital signs: Reviewed and stable  Last Vitals:  Filed Vitals:   10/26/15 0655  BP: 143/63  Pulse: 62  Temp: 36.7 C  Resp: 20    Last Pain:  Filed Vitals:   10/26/15 0657  PainSc: 3       Patients Stated Pain Goal: 8 ([id number])  Complications: No apparent anesthesia complications

## 2015-10-27 ENCOUNTER — Encounter (HOSPITAL_COMMUNITY): Payer: Self-pay | Admitting: General Surgery

## 2015-10-27 DIAGNOSIS — C50111 Malignant neoplasm of central portion of right female breast: Secondary | ICD-10-CM | POA: Diagnosis not present

## 2015-10-27 LAB — GLUCOSE, CAPILLARY: GLUCOSE-CAPILLARY: 201 mg/dL — AB (ref 65–99)

## 2015-10-27 MED ORDER — HYDROCODONE-ACETAMINOPHEN 5-325 MG PO TABS: 1.0000 | ORAL_TABLET | ORAL | Status: DC | PRN

## 2015-10-27 NOTE — Discharge Instructions (Signed)
See above

## 2015-10-27 NOTE — Progress Notes (Signed)
Discharge education provided before discharge, pt and family verbalized understanding the teaching. Pt condition stable on discharge.

## 2015-10-27 NOTE — Discharge Summary (Signed)
Patient ID: Susan [name] [id number] 64 y.o. 28-Oct-1950  Admit date: 10/26/2015  Discharge date and time:   Admitting Physician: Adin Hector  Discharge Physician: Adin Hector  Admission Diagnoses: RIGHT BREAST CANCER  Discharge Diagnoses: CANCER OF CENTRAL PORTION OF RIGHT FEMALE BREAST (C50.11                                          HISTORY OF LEFT BREAST CANCER (Z85.3)                                          FAMILY HISTORY OF BREAST CANCER IN FEMALE (Z80.3)                                          SLEEP APNEA IN ADULT (G47.30)                                          BMI 40.0-44.9, ADULT (Z68.41)                                          TOBACCO ABUSE (Z72.0)                                          TYPE 2 DIABETES MELLITUS TREATED WITHOUT INSULIN (E11.9)                                          COPD, MILD (J44.9)                                          LATENT TUBERCULOSIS BY BLOOD TEST (R76.11)                                          HYPERTENSION                                          CHRONIC GERD (K21.9)                                          HISTORY OF THYROIDECTOMY (E89.0)  Operations: Procedure(s): RIGHT TOTAL MASTECTOMY WITH RIGHT SENTINEL LYMPH NODE BIOPSY, INJECT BLUE DYE RIGHT BREAST, LEFT BREAST PROPHYLACTIC MASTECTOMY  Admission Condition: good  Discharged Condition: good  Indication for Admission: This is a 65 year old woman, referred by Dr. Almyra Free at the [facility] because of right breast  cancer. She has a prior history of left breast cancer treated by Dr. Marylene Buerger and Dr. Marko Plume. Dr. [name] is her PCP. Dr. Baltazar Apo is her pulmonologist.  In 2004 she underwent left lumpectomy and adjuvant radiation therapy for ductal carcinoma in situ left breast. She took tamoxifen for 3 weeks and developed deep venous thrombosis and that was discontinued. She did well thereafter.  She gets annual screening mammograms.  Recent screening mammograms show suspicious calcifications in the right breast upper outer quadrant and centrally, spending 3 cm x 1.9 cm. She went back for image guided biopsies and they discussed 2 areas. They biopsied a lateral area which showed intermediate to high-grade ductal carcinoma in situ, breast diagnostic profile pending. The also stated that there was an area of calcifications more medially and that if breast conservation was desired that this area should also be biopsied. I have reviewed her mammograms and regionally they are very dense and difficult to evaluate.  The patient states that she has decided to have bilateral mastectomies because she now has bilateral breast cancer and has a strong family history. I told her that was reasonable and we talked about this extensively.  Family history reveals maternal aunt died in her 31s of stage IV breast cancer. She has a sister who developed carcinoma in situ in her 60s but died of lymphoma. No ovarian or pancreatic cancer in the family.  She has moderately significant comorbidities including BMI of 40, non-insulin-dependent diabetes, COPD, ongoing tobacco abuse, obstructive sleep apnea, GERD, hypertension, history thyroidectomy and taking Synthroid.  social history reveals that she has 2 daughters ages 61 and 21. Nobody has had genetic testing. One son. She is retired. Smokes a half a pack of cigarettes per day denies alcohol. Trying to quit tobacco.  We had a long discussion. She is not interested in reconstruction now or later. I told her that I would advise her to undergo right total mastectomy with right axillary sentinel node biopsy in case she had invasive disease, left total mastectomy. I described the incisions and the attempts that we will make to avoid dog ears both medially and laterally although this may be a little bit tricky because of her large body habitus laterally. She is accepting of all of  that. She does not want reconstruction.  She has had genetic counseling and genetic testing and that is negative for deleterious mutations.  She has seen Dr. Lindi Adie for a preop medical oncology consultation and will be referred back to him postop.  She'll be scheduled for injection of blue dye right best breast, right total mastectomy, right axillary sentinel node biopsy, left prophylactic total mastectomy.  Hospital Course: On the day of admission the patient was taken to the operating room.  She underwent bilateral total mastectomies and right axillary sentinel lymph node biopsy.  Operative findings were: The patient underwent injection of technetium 99 radionuclide into the right breast in the preop holding area by the nuclear medicine technician. She underwent bilateral pectoral block in the holding area by Dr. Verdie Drown of the anesthesia department. The left breast showed some treatment effect with increased fibrosis. There was no gross cancer within either breast. I found 2 sentinel lymph nodes in the right axilla. Both mastectomy specimens were marked with a silk suture to mark the lateral skin margin     She stated that she had trouble voiding in the early postop.  Following previous surgeries and requested a Foley catheter be left in overnight and so  we did that.  Foley catheter was removed on the morning of postop day 1 and we plan to let her go home if she can void.      She did well postop.  She was feeling very good the morning after surgery and was requesting to go home.  She was tolerating a diet.  She had been day able to ambulate in the room.  The pain control was very good.  Capillary blood sugars were 150 or less.      Examination of her mastectomy wounds was good.  Skin flaps were viable without any skin necrosis.  There was no hematoma or seroma.  All 3 drains were functioning within serosanguineous fluid.  She was moving her shoulders around well and was instructed  about this.  She was given a prescription for Norco.  She was told to continue all of her medications.  Diet and activities were discussed.  Drain care was taught.  She will return to see me in the office in about 10 days for a wound and drain check.  Hopefully the pathology will come out tomorrow.  Consults: None  Significant Diagnostic Studies: Surgical pathology, pending  Treatments: surgery: Right total mastectomy, right axillary sentinel node biopsy, left prophylactic total mastectomy  Disposition: Home  Patient Instructions:    Medication List    TAKE these medications        Adult Blood Pressure Cuff Lg Kit  Use as directed     albuterol 108 (90 Base) MCG/ACT inhaler  Commonly known as:  PROVENTIL HFA;VENTOLIN HFA  Inhale 2 puffs into the lungs every 4 (four) hours as needed for wheezing or shortness of breath (cough, shortness of breath or wheezing.).     cholecalciferol 1000 units tablet  Commonly known as:  VITAMIN D  Take 1,000 Units by mouth daily.     cyclobenzaprine 10 MG tablet  Commonly known as:  FLEXERIL  Take 1 tablet (10 mg total) by mouth 3 (three) times daily as needed for muscle spasms.     DULoxetine 30 MG capsule  Commonly known as:  CYMBALTA  Take 3 capsules (90 mg total) by mouth daily.     esomeprazole 20 MG capsule  Commonly known as:  NEXIUM  Take 20 mg by mouth daily at 12 noon.     fenofibrate 160 MG tablet  Take 1 tablet (160 mg total) by mouth daily.     hydrochlorothiazide 25 MG tablet  Commonly known as:  HYDRODIURIL  TAKE 1 TABLET (25 MG TOTAL) BY MOUTH DAILY.     HYDROcodone-acetaminophen 5-325 MG tablet  Commonly known as:  NORCO/VICODIN  Take 1-2 tablets by mouth every 4 (four) hours as needed for moderate pain.     ibuprofen 200 MG tablet  Commonly known as:  ADVIL,MOTRIN  Take 800 mg by mouth at bedtime.     isoniazid 300 MG tablet  Commonly known as:  NYDRAZID  TAKE 1 TABLET (300 MG TOTAL) BY MOUTH DAILY.      levothyroxine 25 MCG tablet  Commonly known as:  SYNTHROID, LEVOTHROID  Take 1 tablet (25 mcg total) by mouth daily.     metFORMIN 500 MG tablet  Commonly known as:  GLUCOPHAGE  Take 1 tablet (500 mg total) by mouth 2 (two) times daily with a meal.     metoprolol 50 MG tablet  Commonly known as:  LOPRESSOR  TAKE 1 TABLET (50 MG TOTAL) BY MOUTH 2 (TWO) TIMES DAILY.  multivitamin with minerals tablet  Take 1 tablet by mouth daily.     temazepam 7.5 MG capsule  Commonly known as:  RESTORIL  Take 1 capsule (7.5 mg total) by mouth at bedtime as needed for sleep.        Activity: see instructions in AVS.  No driving.  Lots of ambulation.  No heavy lifting. Diet: low fat, low cholesterol diet Wound Care: as directed  Follow-up:  With Dr. Dalbert Batman in 10 days.  Signed: Edsel Petrin. Dalbert Batman, M.D., FACS General and minimally invasive surgery Breast and Colorectal Surgery  10/27/2015, 6:59 AM

## 2015-10-28 ENCOUNTER — Other Ambulatory Visit: Payer: Self-pay | Admitting: *Deleted

## 2015-10-28 DIAGNOSIS — C50411 Malignant neoplasm of upper-outer quadrant of right female breast: Secondary | ICD-10-CM

## 2015-10-28 NOTE — Progress Notes (Signed)
Quick Note:  Inform patient of Pathology report,. Tell her that the left breast had some atypical hyperplasia but no cancer. Tell her that the right breast had residual ductal carcinoma in situ, but all the lymph nodes are negative and this is good news. I will discuss in detail with her at the next office visit.   hmi ______

## 2015-11-02 ENCOUNTER — Encounter: Payer: Self-pay | Admitting: Nurse Practitioner

## 2015-11-02 DIAGNOSIS — C50411 Malignant neoplasm of upper-outer quadrant of right female breast: Secondary | ICD-10-CM

## 2015-11-02 NOTE — Progress Notes (Signed)
The Survivorship Care Plan was mailed to Susan [name] as she reported not being able to come in to the Survivorship Clinic for an in-person visit at this time. A letter was mailed to her outlining the purpose of the content of the care plan, as well as encouraging her to reach out to me with any questions or concerns.  My business card was included in the correspondence to the patient as well.  A copy of the care plan was also routed/faxed/mailed to [facility], [place], the patient's PCP.  I will not be placing any follow-up appointments to the Survivorship Clinic for Susan [name], but I am happy to see her at any time in the future for any survivorship concerns that may arise. Thank you for allowing me to participate in her care!  Kenn File, [place] [phone]

## 2015-11-03 ENCOUNTER — Telehealth: Payer: Self-pay | Admitting: *Deleted

## 2015-11-03 ENCOUNTER — Other Ambulatory Visit: Payer: Self-pay | Admitting: *Deleted

## 2015-11-03 NOTE — Telephone Encounter (Signed)
Left message for a return phone call to see if she wants a follow up appointment with Dr. Lindi Adie.

## 2015-11-05 ENCOUNTER — Ambulatory Visit (INDEPENDENT_AMBULATORY_CARE_PROVIDER_SITE_OTHER): Payer: 59 | Admitting: Family Medicine

## 2015-11-05 ENCOUNTER — Telehealth: Payer: Self-pay | Admitting: General Surgery

## 2015-11-05 VITALS — BP 132/70 | HR 91 | Temp 97.8°F | Resp 18 | Ht 64.0 in | Wt 241.0 lb

## 2015-11-05 DIAGNOSIS — Y803 Surgical instruments, materials and physical medicine devices (including sutures) associated with adverse incidents: Secondary | ICD-10-CM | POA: Diagnosis not present

## 2015-11-05 DIAGNOSIS — C50411 Malignant neoplasm of upper-outer quadrant of right female breast: Secondary | ICD-10-CM | POA: Diagnosis not present

## 2015-11-05 NOTE — Progress Notes (Signed)
Subjective:  By signing my name below, I, [name], attest that this documentation has been prepared under the direction and in the presence of [name], MD. Electronically Signed: [name], [place]. 11/05/2015 , 1:48 PM .  Patient was seen in Room 9 .   Patient ID: Susan [name], female    DOB: 1951-03-23, 65 y.o.   MRN: [id number] Chief Complaint  Patient presents with  . Wound Check    Accidentally cut tube placed on 6/21 (right drainage tube #2)   HPI Susan [name] is a 65 y.o. female who has h/o bilateral breast cancer presents to [facility] for wound check discussion. Patient had left prophylactic mastectomy, right total mastectcomy and sentinel lymph node biopsy on June 21st at [facility].   Patient states she has been home for 11 days since surgery. She was trying to take the bandage off earlier today around the area, but accidentally cut into the right drainage #2. She's tried calling her surgeon but they haven't called back yet. Her husband tried to apply tape to the area at home, but wasn't able to keep it on.   Past Medical History  Diagnosis Date  . Sleep apnea     on CPAP  . Osteopenia   . Fatty liver disease, nonalcoholic   . Obesity   . History of blood clots 2004    during cancer treatment  . Anxiety   . Osteoarthritis   . DDD (degenerative disc disease)   . DJD (degenerative joint disease)   . Cancer [facility]) 2004    ductal carcinoma; lumpectomy  . Depression   . GERD (gastroesophageal reflux disease)   . Fibromyalgia   . Peripheral neuropathy ([place])   . Muscle spasm 12/30/2011  . Anemia 01/28/2012  . HTN (hypertension) 01/28/2012  . Hyperlipidemia 01/28/2012  . Hyperglycemia 01/28/2012  . Preventative health care 01/28/2012  . Pain of left heel 01/28/2012  . Nasal polyp 08/30/2012  . Tobacco abuse-unspec 10/12/2013  . Hip pain, right 10/12/2013  . Hypothyroidism 02/14/2014  . Dysrhythmia     hx palpitations?panic attack  . Peripheral vascular  disease ([place]) 04    axillary,upper chest after 3 weeks on tamoxifen  . COPD (chronic obstructive pulmonary disease) ([facility])     mild  . Shortness of breath dyspnea     occ on exersion  . Tuberculosis     latent  finishing 9 months tx on last month  . Pneumonia     hx  . Diabetes mellitus without complication ([place])     pre rx   Prior to Admission medications   Medication Sig Start Date End Date Taking? Authorizing Provider  albuterol (PROVENTIL HFA;VENTOLIN HFA) 108 (90 BASE) MCG/ACT inhaler Inhale 2 puffs into the lungs every 4 (four) hours as needed for wheezing or shortness of breath (cough, shortness of breath or wheezing.). 08/09/13   [name], MD  Blood Pressure Monitoring (ADULT BLOOD PRESSURE CUFF LG) KIT Use as directed 07/28/15   [name], MD  cholecalciferol (VITAMIN D) 1000 UNITS tablet Take 1,000 Units by mouth daily.    Historical Provider, MD  cyclobenzaprine (FLEXERIL) 10 MG tablet Take 1 tablet (10 mg total) by mouth 3 (three) times daily as needed for muscle spasms. 07/28/15   [name], MD  DULoxetine (CYMBALTA) 30 MG capsule Take 3 capsules (90 mg total) by mouth daily. 09/07/15   [name], MD  esomeprazole (NEXIUM) 20 MG capsule Take 20 mg by  mouth daily at 12 noon.    Historical Provider, MD  fenofibrate 160 MG tablet Take 1 tablet (160 mg total) by mouth daily. 06/28/15   Gay Filler Copland, MD  hydrochlorothiazide (HYDRODIURIL) 25 MG tablet TAKE 1 TABLET (25 MG TOTAL) BY MOUTH DAILY. 07/28/15   [name], MD  HYDROcodone-acetaminophen (NORCO/VICODIN) 5-325 MG tablet Take 1-2 tablets by mouth every 4 (four) hours as needed for moderate pain. 10/27/15   Fanny Skates, MD  ibuprofen (ADVIL,MOTRIN) 200 MG tablet Take 800 mg by mouth at bedtime.    Historical Provider, MD  isoniazid (NYDRAZID) 300 MG tablet TAKE 1 TABLET (300 MG TOTAL) BY MOUTH DAILY. 07/12/15   Collene Gobble, MD  levothyroxine (SYNTHROID, LEVOTHROID) 25 MCG tablet Take 1 tablet (25 mcg total) by mouth  daily. 09/07/15   [name], MD  metFORMIN (GLUCOPHAGE) 500 MG tablet Take 1 tablet (500 mg total) by mouth 2 (two) times daily with a meal. 09/07/15   [name], MD  metoprolol (LOPRESSOR) 50 MG tablet TAKE 1 TABLET (50 MG TOTAL) BY MOUTH 2 (TWO) TIMES DAILY. 12/20/14   Darreld Mclean, MD  Multiple Vitamins-Minerals (MULTIVITAMIN WITH MINERALS) tablet Take 1 tablet by mouth daily.    Historical Provider, MD  temazepam (RESTORIL) 7.5 MG capsule Take 1 capsule (7.5 mg total) by mouth at bedtime as needed for sleep. 07/28/15   [name], MD   Allergies  Allergen Reactions  . Chantix [Varenicline] Other (See Comments)    "Felt like having mental breakdown"  . Ciprofloxacin Other (See Comments)    Muscle tightness, tendon aching and pain  . Penicillins Swelling  . Tamoxifen Other (See Comments)    Blood clots  . Lyrica [Pregabalin] Swelling  . Neurontin [Gabapentin] Swelling  . Morphine And Related Other (See Comments)    MORPHINE DRIP - causes pt to feel irritable and itch  . Ceclor [Cefaclor] Rash  . Other Other (See Comments)    Redness from tape and bandaides  . Sulfa Antibiotics Rash    Review of Systems  Constitutional: Negative for fever, chills, fatigue and unexpected weight change.  Respiratory: Negative for cough.   Gastrointestinal: Negative for nausea, vomiting, diarrhea and constipation.  Skin: Negative for rash and wound.  Neurological: Negative for dizziness, weakness and headaches.       Objective:   Physical Exam  Constitutional: She is oriented to person, place, and time. She appears well-developed and well-nourished. No distress.  HENT:  Head: Normocephalic and atraumatic.  Eyes: EOM are normal. Pupils are equal, round, and reactive to light.  Neck: Neck supple.  Cardiovascular: Normal rate.   Pulmonary/Chest: Effort normal. No respiratory distress.  Drains in place, incisions with erythema but appears to be healing well; no purulence or drainage from  incisional site; the more medial drain from her right breast is cut half-way through approximately 4 inches from the skin, bulb unable to retain suction  Musculoskeletal: Normal range of motion.  Neurological: She is alert and oriented to person, place, and time.  Skin: Skin is warm and dry.  Psychiatric: She has a normal mood and affect. Her behavior is normal.  Nursing note and vitals reviewed.   BP 132/70 mmHg  Pulse 91  Temp(Src) 97.8 F (36.6 C) (Oral)  Resp 18  Ht '5\' 4"'  (1.626 m)  Wt 241 lb (109.317 kg)  BMI 41.35 kg/m2  SpO2 98%    Assessment & Plan:   1. Breast cancer of upper-outer quadrant  of right female breast ([place])   2. Surgical instruments, materials and physical medicine devices (including sutures) associated with adverse incidents    Pt accidentally cut the medial right breast drainage tube with scissors when changing her bandages this morning and it will no longer hold suction to drain into the bulb. She was seen by her surgeon Dr. Dalbert Batman 2d ago who noted that the drains were still quite productive and anticipated that they would need to stay in place for another 7-10d. Pt called [facility] but has not received a return call.  I spoke with a physician on call for the practice who did not have any suggestions to offer as far as management other than stating that we could pull the tube if necessary or just leaving it in place not draining and have pt follow-up in their office next week to have it pulled.  We tried several different occlusions and connections which did not work - ended up cutting the tube at the incision site and attaching the bulb there  - concern it will be uncomfortable and pull on her skin so pt will f/u with surgery next wk if she is having pain.  I personally performed the services described in this documentation, which was scribed in my presence. The recorded information has been reviewed and considered, and addended by me as needed.   [name], M.D.  [facility]  [place] [address] [place], [place] [address] [phone] phone [phone] fax  11/09/2015 8:36 AM

## 2015-11-05 NOTE — Patient Instructions (Signed)
     IF you received an x-ray today, you will receive an invoice from [facility]. Please contact [facility] at [phone] with questions or concerns regarding your invoice.   IF you received labwork today, you will receive an invoice from [facility]. Please contact [facility] at [phone] with questions or concerns regarding your invoice.   Our billing staff will not be able to assist you with questions regarding bills from these companies.  You will be contacted with the lab results as soon as they are available. The fastest way to get your results is to activate your My Chart account. Instructions are located on the last page of this paperwork. If you have not heard from us regarding the results in 2 weeks, please contact this office.      

## 2015-11-05 NOTE — Telephone Encounter (Signed)
Pt accidentally cut her drain ~4 inches from the skin while changing her dressing today.  Was seen at Urgent care.  Recommended they cut the drain at that spot and replace the drain there for now.  If they can't do that, they should just remove the drain.

## 2015-11-29 ENCOUNTER — Ambulatory Visit (INDEPENDENT_AMBULATORY_CARE_PROVIDER_SITE_OTHER): Payer: 59 | Admitting: Family Medicine

## 2015-11-29 VITALS — BP 122/74 | HR 98 | Temp 98.5°F | Resp 20 | Ht 64.0 in | Wt 236.6 lb

## 2015-11-29 DIAGNOSIS — Z9013 Acquired absence of bilateral breasts and nipples: Secondary | ICD-10-CM | POA: Diagnosis not present

## 2015-11-29 DIAGNOSIS — E119 Type 2 diabetes mellitus without complications: Secondary | ICD-10-CM

## 2015-11-29 DIAGNOSIS — I10 Essential (primary) hypertension: Secondary | ICD-10-CM

## 2015-11-29 DIAGNOSIS — K760 Fatty (change of) liver, not elsewhere classified: Secondary | ICD-10-CM | POA: Diagnosis not present

## 2015-11-29 DIAGNOSIS — E039 Hypothyroidism, unspecified: Secondary | ICD-10-CM

## 2015-11-29 DIAGNOSIS — L03313 Cellulitis of chest wall: Secondary | ICD-10-CM

## 2015-11-29 NOTE — Patient Instructions (Addendum)
IF you received an x-ray today, you will receive an invoice from [facility]. Please contact [facility] at [phone] with questions or concerns regarding your invoice.   IF you received labwork today, you will receive an invoice from [facility]. Please contact [facility] at [phone] with questions or concerns regarding your invoice.   Our billing staff will not be able to assist you with questions regarding bills from these companies.  You will be contacted with the lab results as soon as they are available. The fastest way to get your results is to activate your My Chart account. Instructions are located on the last page of this paperwork. If you have not heard from [place] regarding the results in 2 weeks, please contact this office.     Bulb Drain Home Care A bulb drain consists of a thin rubber tube and a soft, round bulb that creates a gentle suction. The rubber tube is placed in the area where you had surgery. A bulb is attached to the end of the tube that is outside the body. The bulb drain removes excess fluid that normally builds up in a surgical wound after surgery. The color and amount of fluid will vary. Immediately after surgery, the fluid is bright red and is a little thicker than water. It may gradually change to a yellow or pink color and become more thin and water-like. When the amount decreases to about 1 or 2 tbsp in 24 hours, your health care provider will usually remove it. DAILY CARE  Keep the bulb flat (compressed) at all times, except while emptying it. The flatness creates suction. You can flatten the bulb by squeezing it firmly in the middle and then closing the cap.  Keep sites where the tube enters the skin dry and covered with a bandage (dressing).  Secure the tube 1-2 in (2.5-5.1 cm) below the insertion sites to keep it from pulling on your stitches. The tube is stitched in place and will not slip out.  Secure  the bulb as directed by your health care provider.  For the first 3 days after surgery, there usually is more fluid in the bulb. Empty the bulb whenever it becomes half full because the bulb does not create enough suction if it is too full. The bulb could also overflow. Write down how much fluid you remove each time you empty your drain. Add up the amount removed in 24 hours.  Empty the bulb at the same time every day once the amount of fluid decreases and you only need to empty it once a day. Write down the amounts and the 24-hour totals to give to your health care provider. This helps your health care provider know when the tubes can be removed. EMPTYING THE BULB DRAIN Before emptying the bulb, get a measuring cup, a piece of paper and a pen, and wash your hands.  Gently run your fingers down the tube (stripping) to empty any drainage from the tubing into the bulb. This may need to be done several times a day to clear the tubing of clots and tissue.  Open the bulb cap to release suction, which causes it to inflate. Do not touch the inside of the cap.  Gently run your fingers down the tube (stripping) to empty any drainage from the tubing into the bulb.  Hold the cap out of the way, and pour fluid into the measuring cup.   Squeeze the bulb to provide suction.  Replace the cap.   Check the tape that holds the tube to your skin. If it is becoming loose, you can remove the loose piece of tape and apply a new one. Then, pin the bulb to your shirt.   Write down the amount of fluid you emptied out. Write down the date and each time you emptied your bulb drain. (If there are 2 bulbs, note the amount of drainage from each bulb and keep the totals separate. Your health care provider will want to know the total amounts for each drain and which tube is draining more.)   Flush the fluid down the toilet and wash your hands.   Call your health care provider once you have less than 2 tbsp of fluid  collecting in the bulb drain every 24 hours. If there is drainage around the tube site, change dressings and keep the area dry. Cleanse around tube with sterile saline and place dry gauze around site. This gauze should be changed when it is soiled. If it stays clean and unsoiled, it should still be changed daily.  SEEK MEDICAL CARE IF:  Your drainage has a bad smell or is cloudy.   You have a fever.   Your drainage is increasing instead of decreasing.   Your tube fell out.   You have redness or swelling around the tube site.   You have drainage from a surgical wound.   Your bulb drain will not stay flat after you empty it.  MAKE SURE YOU:   Understand these instructions.  Will watch your condition.  Will get help right away if you are not doing well or get worse.   This information is not intended to replace advice given to you by your health care provider. Make sure you discuss any questions you have with your health care provider.   Document Released: 04/20/2000 Document Revised: 05/14/2014 Document Reviewed: 11/10/2014 Elsevier Interactive Patient Education [employer].

## 2015-11-29 NOTE — Progress Notes (Signed)
By signing my name below I, Tereasa Coop, attest that this documentation has been prepared under the direction and in the presence of [name], MD. Electonically Signed. Tereasa Coop, Scribe 11/29/2015 at 2:40 PM  Subjective:    Patient ID: Susan [name], female    DOB: 1951-03-16, 65 y.o.   MRN: [id number]  Chief Complaint  Patient presents with  . Cellulitis    post mastectomy    HPI Susan [name] is a 65 y.o. female who presents to the [facility] to discuss post-mastectomy healing process. Pt states that she just started her third round of a 10 day coarse of 153m Doxycycline BID.Pt states that first cellulitis was on rt chest and progressed to left. Her second episode of cellulitis was on her left chest. Her last episode of cellulitis was also on her left chest. Pt reports that each episode, her skin became bright red and had an increased warmth.    Pt is concerned about infection after her bilat mastectomy because she is still draining 40ccs-60ccs out of one drain tube and 20-40ccs of fluid from her second wound drain. Pt had a third drain that was removed. Pt has been keeping surgical site clean and dry. Pt has been using lidocaine cream around incision points to control pain.   Pt states that her stomach has been feeling fine while taking all the doxycycline. Pt c/o minor constipation for the past few days.  Pt also reports that she is finally starting to sleep well as the pain post-surgery and cellulitis is starting to resolve.   Pt's fasting blood sugar has been in the 130s in the morning. Pt states that her metformin has not been bothering her stomach. Pt states she has not been otherwise checking her blood sugar. Pt's last A1C was taken 6 weeks ago and was 6. Pt has been taking 10065mvitamin C QD. Pt has been dieting well and has reduced the amount of carbohydrates in her diet.  Pt has completely quit smoking and her last cigarette was June 20th 2017.     Patient Active Problem List   Diagnosis Date Noted  . Cancer of central portion of right breast (HCOtis06/21/2017  . Genetic testing 10/17/2015  . History of breast cancer in female 09/15/2015  . Breast cancer of upper-outer quadrant of right female breast (HCC[place]05/03/2016  . Family history of breast cancer in female 09/15/2015  . Family history of colon cancer 09/15/2015  . TB lung, latent 01/11/2015  . COPD (chronic obstructive pulmonary disease) ([place]CH[place]06/06/2014  . Mediastinal lymphadenopathy 10/07/2014  . Diabetes mellitus type 2, controlled (HCK[place]05/25/2016  . Pulmonary nodule 09/29/2014  . History of chest pain 09/29/2014  . Essential hypertension 09/29/2014  . Chest pain 09/23/2014  . Hypothyroidism 02/14/2014  . Tobacco abuse-unspec 10/12/2013  . Hip pain, right 10/12/2013  . Cervical cancer screening 03/09/2013  . Seasonal allergies 09/12/2012  . Nasal polyp 08/30/2012  . SOM (serous otitis media) 08/30/2012  . Dysuria 07/20/2012  . Peripheral neuropathy (HCG[place]03/15/2014  . Cervical myofascial pain syndrome 06/08/2012  . Anemia 01/28/2012  . HTN (hypertension) 01/28/2012  . Hyperlipidemia 01/28/2012  . Hyperglycemia 01/28/2012  . Preventative health care 01/28/2012  . Pain of left heel 01/28/2012  . Neck pain 12/30/2011  . Muscle spasm 12/30/2011  . Anxiety and depression   . Osteoarthritis   . Cancer (HCHeron  . GERD (gastroesophageal reflux disease)   . Fibromyalgia   .  Sleep apnea   . Obesity   . History of blood clots   . Fatty liver disease, nonalcoholic   . DDD (degenerative disc disease)     Current Outpatient Prescriptions on File Prior to Visit  Medication Sig Dispense Refill  . albuterol (PROVENTIL HFA;VENTOLIN HFA) 108 (90 BASE) MCG/ACT inhaler Inhale 2 puffs into the lungs every 4 (four) hours as needed for wheezing or shortness of breath (cough, shortness of breath or wheezing.). 1 Inhaler 1  . Blood Pressure Monitoring (ADULT BLOOD PRESSURE  CUFF LG) KIT Use as directed 1 each 0  . cholecalciferol (VITAMIN D) 1000 UNITS tablet Take 1,000 Units by mouth daily.    . cyclobenzaprine (FLEXERIL) 10 MG tablet Take 1 tablet (10 mg total) by mouth 3 (three) times daily as needed for muscle spasms. 60 tablet 5  . DULoxetine (CYMBALTA) 30 MG capsule Take 3 capsules (90 mg total) by mouth daily. 270 capsule 1  . esomeprazole (NEXIUM) 20 MG capsule Take 20 mg by mouth daily at 12 noon.    . fenofibrate 160 MG tablet Take 1 tablet (160 mg total) by mouth daily. 90 tablet 3  . hydrochlorothiazide (HYDRODIURIL) 25 MG tablet TAKE 1 TABLET (25 MG TOTAL) BY MOUTH DAILY. 30 tablet 5  . ibuprofen (ADVIL,MOTRIN) 200 MG tablet Take 800 mg by mouth at bedtime.    Marland Kitchen levothyroxine (SYNTHROID, LEVOTHROID) 25 MCG tablet Take 1 tablet (25 mcg total) by mouth daily. 90 tablet 1  . metFORMIN (GLUCOPHAGE) 500 MG tablet Take 1 tablet (500 mg total) by mouth 2 (two) times daily with a meal. 180 tablet 1  . metoprolol (LOPRESSOR) 50 MG tablet TAKE 1 TABLET (50 MG TOTAL) BY MOUTH 2 (TWO) TIMES DAILY. 180 tablet 3  . Multiple Vitamins-Minerals (MULTIVITAMIN WITH MINERALS) tablet Take 1 tablet by mouth daily.    . temazepam (RESTORIL) 7.5 MG capsule Take 1 capsule (7.5 mg total) by mouth at bedtime as needed for sleep. 30 capsule 5   No current facility-administered medications on file prior to visit.     Allergies  Allergen Reactions  . Chantix [Varenicline] Other (See Comments)    "Felt like having mental breakdown"  . Ciprofloxacin Other (See Comments)    Muscle tightness, tendon aching and pain  . Penicillins Swelling  . Tamoxifen Other (See Comments)    Blood clots  . Lyrica [Pregabalin] Swelling  . Neurontin [Gabapentin] Swelling  . Morphine And Related Other (See Comments)    MORPHINE DRIP - causes pt to feel irritable and itch  . Ceclor [Cefaclor] Rash  . Other Other (See Comments)    Redness from tape and bandaides  . Sulfa Antibiotics Rash     Depression screen [facility] 2/9 11/29/2015 11/05/2015 07/28/2015 04/20/2015 12/20/2014  Decreased Interest 0 0 0 0 0  Down, Depressed, Hopeless 0 0 1 0 0  PHQ - 2 Score 0 0 1 0 0       Review of Systems  Constitutional: Negative for chills and fever.  HENT: Negative for congestion.   Eyes: Negative for visual disturbance.  Respiratory: Negative for cough and shortness of breath.   Cardiovascular: Negative for chest pain.  Gastrointestinal: Positive for constipation (minor). Negative for abdominal pain, diarrhea, nausea and vomiting.  Genitourinary: Negative for dysuria.  Musculoskeletal: Negative for back pain.  Skin: Positive for rash (recurrent, currently resolved) and wound (post-surgical drainage).  Neurological: Negative for headaches.  Psychiatric/Behavioral: Negative for sleep disturbance.       Objective:  Physical  Exam  Constitutional: She is oriented to person, place, and time. She appears well-developed and well-nourished. No distress.  HENT:  Head: Normocephalic and atraumatic.  Eyes: Conjunctivae are normal. Pupils are equal, round, and reactive to light.  Neck: Neck supple.  Cardiovascular: Normal rate, regular rhythm and normal heart sounds.  Exam reveals no gallop and no friction rub.   No murmur heard. Pulmonary/Chest: Effort normal and breath sounds normal. She has no decreased breath sounds. She has no wheezes. She has no rhonchi. She has no rales.  Musculoskeletal: Normal range of motion.  Neurological: She is alert and oriented to person, place, and time.  Skin: Skin is warm and dry.  bilat well healed mastectomy scars with significant amount of induration below each scar, worse on left. Left lateral aspect has increased warmth. No erythema, no drainage. Pt has one drainage tube in each lateral aspect of breasts. Tubing interior lined with yellowish debris. Bulb filled with yellowish-orange fluid.   Psychiatric: She has a normal mood and affect. Her behavior is  normal.  Nursing note and vitals reviewed.  BP 122/74 (BP Location: Right Arm, Patient Position: Sitting, Cuff Size: Large)   Pulse 98   Temp 98.5 F (36.9 C) (Oral)   Resp 20   Ht _0  (1.626 m)   Wt 236 lb 9.6 oz (107.3 kg)   SpO2 97%   BMI 40.61 kg/m         Assessment & Plan:  Will culture fluid in drainage bulb and send culture results to Pt's surgeon Dr Dalbert Batman.    1. Cellulitis of chest wall - pt is starting her 3rd 7d course of doxycycline from her surgeon Dr. Dalbert Batman. No appearance of cellulitis currently. Certainly her drains look like they might be seeded/colonized.  2. Status post mastectomy, bilateral - discussed trying binding/wrapping chest if not painful in order to reduce potential space for serous fluid accumulation in hopes of getting her drains pulled  3. Controlled type 2 diabetes mellitus without complication, without long-term current use of insulin (HCC) - 6 wks prior a1c was 6.0.  Non-fasting cbgs 150s but fasting are 130s - assume higher with recent infections and stressors [name] cont on metformin 500 bid for now.  Recheck in 2 mos  4. Hypothyroidism, unspecified hypothyroidism type   5. Essential hypertension - pt plans to obtain cuff and start checking at home.  6. Fatty liver disease, nonalcoholic - lfts last mo were very midly elev - recheck at f/u    Orders Placed This Encounter  Procedures  . WOUND CULTURE    Please fax results to Dr. Fanny Skates at [facility] [place] [facility] as well    Order Specific Question:   Source    Answer:   left drain from mastectomy  . WOUND CULTURE    Please fax copy to Dr. Fanny Skates at [facility]    Standing Status:   Future    Standing Expiration Date:   11/28/2016    Order Specific Question:   Source    Answer:   drainage from mastectomy site    Meds ordered this encounter  Medications  . doxycycline (VIBRAMYCIN) 100 MG capsule    Sig: Take 100 mg by mouth 2 (two) times daily.    I personally  performed the services described in this documentation, which was scribed in my presence. The recorded information has been reviewed and considered, and addended by me as needed.   [name], M.D.  [facility] [address] [place], [place] [address] [phone] phone [phone] fax  11/29/15 3:35 PM

## 2015-11-30 ENCOUNTER — Other Ambulatory Visit: Payer: 59 | Admitting: Family Medicine

## 2015-11-30 DIAGNOSIS — L03313 Cellulitis of chest wall: Secondary | ICD-10-CM

## 2015-12-02 LAB — WOUND CULTURE
GRAM STAIN: NONE SEEN
GRAM STAIN: NONE SEEN

## 2015-12-03 ENCOUNTER — Ambulatory Visit (INDEPENDENT_AMBULATORY_CARE_PROVIDER_SITE_OTHER): Payer: 59 | Admitting: Family Medicine

## 2015-12-03 VITALS — BP 130/70 | HR 84 | Temp 98.6°F | Resp 16 | Ht 64.0 in | Wt 234.4 lb

## 2015-12-03 DIAGNOSIS — N61 Mastitis without abscess: Secondary | ICD-10-CM | POA: Diagnosis not present

## 2015-12-03 DIAGNOSIS — L02219 Cutaneous abscess of trunk, unspecified: Secondary | ICD-10-CM | POA: Diagnosis not present

## 2015-12-03 DIAGNOSIS — L03319 Cellulitis of trunk, unspecified: Secondary | ICD-10-CM

## 2015-12-03 LAB — POCT CBC
Granulocyte percent: 49.9 %G (ref 37–80)
HCT, POC: 34.1 % — AB (ref 37.7–47.9)
HEMOGLOBIN: 11.9 g/dL — AB (ref 12.2–16.2)
Lymph, poc: 2.4 (ref 0.6–3.4)
MCH: 29 pg (ref 27–31.2)
MCHC: 34.9 g/dL (ref 31.8–35.4)
MCV: 83.1 fL (ref 80–97)
MID (cbc): 0.6 (ref 0–0.9)
MPV: 7.1 fL (ref 0–99.8)
POC GRANULOCYTE: 2.9 (ref 2–6.9)
POC LYMPH PERCENT: 41.3 %L (ref 10–50)
POC MID %: 9.7 % (ref 0–12)
Platelet Count, POC: 276 10*3/uL (ref 142–424)
RBC: 4.11 M/uL (ref 4.04–5.48)
RDW, POC: 13 %
WBC: 5.9 10*3/uL (ref 4.6–10.2)

## 2015-12-03 MED ORDER — LEVOFLOXACIN 500 MG PO TABS: 500.0000 mg | ORAL_TABLET | Freq: Every day | ORAL | 0 refills | Status: DC

## 2015-12-03 MED ORDER — CEFTRIAXONE SODIUM 1 G IJ SOLR
1.0000 g | INTRAMUSCULAR | Status: AC
  Administered 2015-12-03 – 2015-12-06 (×3): 1 g via INTRAMUSCULAR

## 2015-12-03 NOTE — Patient Instructions (Addendum)
     IF you received an x-ray today, you will receive an invoice from [facility]. Please contact [facility] at [phone] with questions or concerns regarding your invoice.   IF you received labwork today, you will receive an invoice from [employer]. Please contact [facility] at [phone] with questions or concerns regarding your invoice.   Our billing staff will not be able to assist you with questions regarding bills from these companies.  You will be contacted with the lab results as soon as they are available. The fastest way to get your results is to activate your My Chart account. Instructions are located on the last page of this paperwork. If you have not heard from [place] regarding the results in 2 weeks, please contact this office.      Cellulitis Cellulitis is an infection of the skin and the tissue beneath it. The infected area is usually red and tender. Cellulitis occurs most often in the arms and lower legs.  CAUSES  Cellulitis is caused by bacteria that enter the skin through cracks or cuts in the skin. The most common types of bacteria that cause cellulitis are staphylococci and streptococci. SIGNS AND SYMPTOMS   Redness and warmth.  Swelling.  Tenderness or pain.  Fever. DIAGNOSIS  Your health care provider can usually determine what is wrong based on a physical exam. Blood tests may also be done. TREATMENT  Treatment usually involves taking an antibiotic medicine. HOME CARE INSTRUCTIONS   Take your antibiotic medicine as directed by your health care provider. Finish the antibiotic even if you start to feel better.  Keep the infected arm or leg elevated to reduce swelling.  Apply a warm cloth to the affected area up to 4 times per day to relieve pain.  Take medicines only as directed by your health care provider.  Keep all follow-up visits as directed by your health care provider. SEEK MEDICAL CARE IF:   You  notice red streaks coming from the infected area.  Your red area gets larger or turns dark in color.  Your bone or joint underneath the infected area becomes painful after the skin has healed.  Your infection returns in the same area or another area.  You notice a swollen bump in the infected area.  You develop new symptoms.  You have a fever. SEEK IMMEDIATE MEDICAL CARE IF:   You feel very sleepy.  You develop vomiting or diarrhea.  You have a general ill feeling (malaise) with muscle aches and pains.   This information is not intended to replace advice given to you by your health care provider. Make sure you discuss any questions you have with your health care provider.   Document Released: 01/31/2005 Document Revised: 01/12/2015 Document Reviewed: 07/09/2011 Elsevier Interactive Patient Education [employer].

## 2015-12-05 ENCOUNTER — Ambulatory Visit (INDEPENDENT_AMBULATORY_CARE_PROVIDER_SITE_OTHER): Payer: 59 | Admitting: Family Medicine

## 2015-12-05 ENCOUNTER — Ambulatory Visit: Payer: 59 | Admitting: Hematology and Oncology

## 2015-12-05 DIAGNOSIS — L03313 Cellulitis of chest wall: Secondary | ICD-10-CM

## 2015-12-05 NOTE — Progress Notes (Signed)
Subjective:    Patient ID: Susan [name], female    DOB: March 06, 1951, 65 y.o.   MRN: [id number] By signing my name below, I, Judithe Modest, attest that this documentation has been prepared under the direction and in the presence of [name], MD. Electronically Signed: Judithe Modest, ER Scribe. 12/03/2015. 11:06 AM.  Chief Complaint  Patient presents with  . Wound Infection    left breast area, mastectomy 5 weeks ago   HPI HPI Comments: Susan [name] is a 65 y.o. female who presents to [facility] complaining of redness, drainage, and pain in her left breast area near on her mastectomy site. She has a recent hx of bilateral breast mastectomy. She was seen on 11/29/2015 and was diagnosed with cellulitis and prescribed a 3rd course of doxycycline by her surgeon. She had a fever of 99.2 yesterday. Her left and right drain sites have been draining fluid and pus. She has been eating and drinking normally. She has has some indigestion when she takes her doxycycline.   Past Medical History:  Diagnosis Date  . Anemia 01/28/2012  . Anxiety   . Cancer [facility]) 2004   ductal carcinoma; lumpectomy  . COPD (chronic obstructive pulmonary disease) (HCC)    mild  . DDD (degenerative disc disease)   . Depression   . Diabetes mellitus without complication ([place])    pre rx  . DJD (degenerative joint disease)   . Dysrhythmia    hx palpitations?panic attack  . Fatty liver disease, nonalcoholic   . Fibromyalgia   . GERD (gastroesophageal reflux disease)   . Hip pain, right 10/12/2013  . History of blood clots 2004   during cancer treatment  . HTN (hypertension) 01/28/2012  . Hyperglycemia 01/28/2012  . Hyperlipidemia 01/28/2012  . Hypothyroidism 02/14/2014  . Muscle spasm 12/30/2011  . Nasal polyp 08/30/2012  . Obesity   . Osteoarthritis   . Osteopenia   . Pain of left heel 01/28/2012  . Peripheral neuropathy ([place])   . Peripheral vascular disease ([place]) 04   axillary,upper chest after 3 weeks on  tamoxifen  . Pneumonia    hx  . Preventative health care 01/28/2012  . Shortness of breath dyspnea    occ on exersion  . Sleep apnea    on CPAP  . Tobacco abuse-unspec 10/12/2013  . Tuberculosis    latent  finishing 9 months tx on last month   Allergies  Allergen Reactions  . Chantix [Varenicline] Other (See Comments)    "Felt like having mental breakdown"  . Ciprofloxacin Other (See Comments)    Muscle tightness, tendon aching and pain  . Penicillins Swelling  . Tamoxifen Other (See Comments)    Blood clots  . Lyrica [Pregabalin] Swelling  . Neurontin [Gabapentin] Swelling  . Morphine And Related Other (See Comments)    MORPHINE DRIP - causes pt to feel irritable and itch  . Ceclor [Cefaclor] Rash  . Other Other (See Comments)    Redness from tape and bandaides  . Sulfa Antibiotics Rash   Current Outpatient Prescriptions on File Prior to Visit  Medication Sig Dispense Refill  . albuterol (PROVENTIL HFA;VENTOLIN HFA) 108 (90 BASE) MCG/ACT inhaler Inhale 2 puffs into the lungs every 4 (four) hours as needed for wheezing or shortness of breath (cough, shortness of breath or wheezing.). 1 Inhaler 1  . Blood Pressure Monitoring (ADULT BLOOD PRESSURE CUFF LG) KIT Use as directed 1 each 0  . cholecalciferol (VITAMIN D) 1000 UNITS tablet Take 1,000  Units by mouth daily.    . cyclobenzaprine (FLEXERIL) 10 MG tablet Take 1 tablet (10 mg total) by mouth 3 (three) times daily as needed for muscle spasms. 60 tablet 5  . DULoxetine (CYMBALTA) 30 MG capsule Take 3 capsules (90 mg total) by mouth daily. 270 capsule 1  . esomeprazole (NEXIUM) 20 MG capsule Take 20 mg by mouth daily at 12 noon.    . fenofibrate 160 MG tablet Take 1 tablet (160 mg total) by mouth daily. 90 tablet 3  . hydrochlorothiazide (HYDRODIURIL) 25 MG tablet TAKE 1 TABLET (25 MG TOTAL) BY MOUTH DAILY. 30 tablet 5  . ibuprofen (ADVIL,MOTRIN) 200 MG tablet Take 800 mg by mouth at bedtime.    Marland Kitchen levothyroxine (SYNTHROID,  LEVOTHROID) 25 MCG tablet Take 1 tablet (25 mcg total) by mouth daily. 90 tablet 1  . metFORMIN (GLUCOPHAGE) 500 MG tablet Take 1 tablet (500 mg total) by mouth 2 (two) times daily with a meal. 180 tablet 1  . metoprolol (LOPRESSOR) 50 MG tablet TAKE 1 TABLET (50 MG TOTAL) BY MOUTH 2 (TWO) TIMES DAILY. 180 tablet 3  . Multiple Vitamins-Minerals (MULTIVITAMIN WITH MINERALS) tablet Take 1 tablet by mouth daily.    . temazepam (RESTORIL) 7.5 MG capsule Take 1 capsule (7.5 mg total) by mouth at bedtime as needed for sleep. 30 capsule 5   No current facility-administered medications on file prior to visit.     Review of Systems  Constitutional: Positive for activity change, chills, diaphoresis, fatigue and fever. Negative for appetite change.  Gastrointestinal: Positive for abdominal pain. Negative for constipation, diarrhea, nausea and vomiting.  Musculoskeletal: Positive for arthralgias and myalgias. Negative for joint swelling.  Skin: Positive for color change, rash and wound. Negative for pallor.  Allergic/Immunologic: Negative for immunocompromised state.  Hematological: Negative for adenopathy. Does not bruise/bleed easily.  Psychiatric/Behavioral: Positive for sleep disturbance.       Objective:   Physical Exam  Constitutional: She is oriented to person, place, and time. She appears well-developed and well-nourished. No distress.  HENT:  Head: Normocephalic and atraumatic.  Mouth/Throat: Oropharynx is clear and moist. No oropharyngeal exudate.  Positive tonsillar and anterior cervical adenopathy.   Eyes: Pupils are equal, round, and reactive to light.  Neck: Neck supple. No thyromegaly present.  Cardiovascular: Normal rate.   Pulmonary/Chest: Effort normal. No respiratory distress.  Musculoskeletal: Normal range of motion.  Neurological: She is alert and oriented to person, place, and time. Coordination normal.  Skin: Skin is warm and dry. She is not diaphoretic.  Mild blanching  erythema around the left mastectomy site extending outside of the boundaries the pt drew yesterday with some induration in the lateral anterior left aspect. No discrete lymphadenopathy detected in the left axilla.   Psychiatric: She has a normal mood and affect. Her behavior is normal.  Nursing note and vitals reviewed.   BP 130/70 (BP Location: Left Arm, Patient Position: Sitting, Cuff Size: Large)   Pulse 84   Temp 98.6 F (37 C) (Oral)   Resp 16   Ht _0  (1.626 m)   Wt 234 lb 6 oz (106.3 kg)   SpO2 98%   BMI 40.23 kg/m      Results for orders placed or performed in visit on 12/03/15  POCT CBC  Result Value Ref Range   WBC 5.9 4.6 - 10.2 K/uL   Lymph, poc 2.4 0.6 - 3.4   POC LYMPH PERCENT 41.3 10 - 50 %L   MID (cbc) 0.6 0 -  0.9   POC MID % 9.7 0 - 12 %M   POC Granulocyte 2.9 2 - 6.9   Granulocyte percent 49.9 37 - 80 %G   RBC 4.11 4.04 - 5.48 M/uL   Hemoglobin 11.9 (A) 12.2 - 16.2 g/dL   HCT, POC 34.1 (A) 37.7 - 47.9 %   MCV 83.1 80 - 97 fL   MCH, POC 29.0 27 - 31.2 pg   MCHC 34.9 31.8 - 35.4 g/dL   RDW, POC 13.0 %   Platelet Count, POC 276 142 - 424 K/uL   MPV 7.1 0 - 99.8 fL    Assessment & Plan:   1. Mastitis   2. Cellulitis and abscess of trunk   Two separate wound clx grew out Serratia marcescens - [place] reports that doxycycline has no activity against this bacteria which would explain why pt's cellulitis rash is worsening while she is on her 3rd course of doxy.  Stop doxy - Serratia sensitivities show that ceftriaxone, cipro, levaquin, and bactrim would all be effective.  Unfortunately, pt has drug allergies to all of these. She reports that Bactrim causeed a red rash at her axilla and [name] I am concerned that this would mimic the cellulitis and make it difficulty to distinguish infection from drug rash.  She reports cipro just caused some achiness [name] she is more than willing to do a trial of levaquin.  Will also treat with Rocephin 1g IM x 1 today, we are closed  tomorrow but she will return on Monday 7/31 for second dose and then recheck with me on Tues 8/1 for 3rd IM Rocephin shot to see if we need to continue and if she is tolerating the levaquin - did discuss black box warning in detail.  If she can't tolerate levaquin at all, advised pt to call me through the answering service and go to [facility] tomorrow for Rocphin inj  Orders Placed This Encounter  Procedures  . POCT CBC    Meds ordered this encounter  Medications  . cefTRIAXone (ROCEPHIN) injection 1 g    Order Specific Question:   Antibiotic Indication:    Answer:   Cellulitis  . levofloxacin (LEVAQUIN) 500 MG tablet    Sig: Take 1 tablet (500 mg total) by mouth daily.    Dispense:  10 tablet    Refill:  0    I personally performed the services described in this documentation, which was scribed in my presence. The recorded information has been reviewed and considered, and addended by me as needed.   [name], M.D.  [facility] [address] [place], [place] [address] [phone] phone [phone] fax  12/05/15 9:47 AM

## 2015-12-06 ENCOUNTER — Ambulatory Visit (INDEPENDENT_AMBULATORY_CARE_PROVIDER_SITE_OTHER): Payer: 59 | Admitting: Family Medicine

## 2015-12-06 ENCOUNTER — Encounter: Payer: Self-pay | Admitting: Family Medicine

## 2015-12-06 VITALS — BP 126/80 | HR 96 | Temp 98.5°F | Resp 18 | Ht 64.0 in | Wt 236.0 lb

## 2015-12-06 DIAGNOSIS — N61 Mastitis without abscess: Secondary | ICD-10-CM | POA: Diagnosis not present

## 2015-12-06 DIAGNOSIS — L02219 Cutaneous abscess of trunk, unspecified: Secondary | ICD-10-CM

## 2015-12-06 DIAGNOSIS — L03319 Cellulitis of trunk, unspecified: Secondary | ICD-10-CM | POA: Diagnosis not present

## 2015-12-06 NOTE — Progress Notes (Signed)
By signing my name below, I, Mesha Guinyard, attest that this documentation has been prepared under the direction and in the presence of [name], MD.  Electronically Signed: Verlee Monte, Medical Scribe. 12/06/15. 8:17 AM.  Subjective:    Patient ID: Susan [name], female    DOB: 03/14/1951, 65 y.o.   MRN: [id number]  HPI Chief Complaint  Patient presents with  . Follow-up    HPI Comments: Susan [name] is a 65 y.o. female who presents to the [facility] complaining of day 3 of 3 of 1 g of rocephin injections. She was intinally on repeated coursed of doxycyline. We cultured Serratia Marcescens from her wound that doxycycline is not active against, it was sensitive to ceftriaxone, as well as Levaquin although pt has a cipro allergy, she was still trying to use Levaquin.  Pt states she's feeling better and has mild aches from Levaquin. Pt had some pain yesterday after picking up her 30lbs grandson. Pt used her heating pad, and took some Tylenol for relief to her symptoms. Pt denies drainage from the site, fever, chills, and diaphoresis.  Patient Active Problem List   Diagnosis Date Noted  . Cancer of central portion of right breast ([place]) 10/26/2015  . Genetic testing 10/17/2015  . History of breast cancer in female 09/15/2015  . Breast cancer of upper-outer quadrant of right female breast ([place]) 09/15/2015  . Family history of breast cancer in female 09/15/2015  . Family history of colon cancer 09/15/2015  . TB lung, latent 01/11/2015  . COPD (chronic obstructive pulmonary disease) ([place]) 10/07/2014  . Mediastinal lymphadenopathy 10/07/2014  . Diabetes mellitus type 2, controlled ([place]) 09/29/2014  . Pulmonary nodule 09/29/2014  . History of chest pain 09/29/2014  . Essential hypertension 09/29/2014  . Chest pain 09/23/2014  . Hypothyroidism 02/14/2014  . Tobacco abuse-unspec 10/12/2013  . Hip pain, right 10/12/2013  . Cervical cancer screening  03/09/2013  . Seasonal allergies 09/12/2012  . Nasal polyp 08/30/2012  . SOM (serous otitis media) 08/30/2012  . Dysuria 07/20/2012  . Peripheral neuropathy ([place]) 07/19/2012  . Cervical myofascial pain syndrome 06/08/2012  . Anemia 01/28/2012  . HTN (hypertension) 01/28/2012  . Hyperlipidemia 01/28/2012  . Hyperglycemia 01/28/2012  . Preventative health care 01/28/2012  . Pain of left heel 01/28/2012  . Neck pain 12/30/2011  . Muscle spasm 12/30/2011  . Anxiety and depression   . Osteoarthritis   . Cancer ([place])   . GERD (gastroesophageal reflux disease)   . Fibromyalgia   . Sleep apnea   . Obesity   . History of blood clots   . Fatty liver disease, nonalcoholic   . DDD (degenerative disc disease)    Past Medical History:  Diagnosis Date  . Anemia 01/28/2012  . Anxiety   . Cancer [facility]) 2004   ductal carcinoma; lumpectomy  . COPD (chronic obstructive pulmonary disease) (HCC)    mild  . DDD (degenerative disc disease)   . Depression   . Diabetes mellitus without complication ([place])    pre rx  . DJD (degenerative joint disease)   . Dysrhythmia    hx palpitations?panic attack  . Fatty liver disease, nonalcoholic   . Fibromyalgia   . GERD (gastroesophageal reflux disease)   . Hip pain, right 10/12/2013  . History of blood clots 2004   during cancer treatment  . HTN (hypertension) 01/28/2012  . Hyperglycemia 01/28/2012  . Hyperlipidemia 01/28/2012  . Hypothyroidism 02/14/2014  . Muscle spasm 12/30/2011  . Nasal  polyp 08/30/2012  . Obesity   . Osteoarthritis   . Osteopenia   . Pain of left heel 01/28/2012  . Peripheral neuropathy ([place])   . Peripheral vascular disease ([place]) 04   axillary,upper chest after 3 weeks on tamoxifen  . Pneumonia    hx  . Preventative health care 01/28/2012  . Shortness of breath dyspnea    occ on exersion  . Sleep apnea    on CPAP  . Tobacco abuse-unspec 10/12/2013  . Tuberculosis    latent  finishing 9 months tx on last month   Past  Surgical History:  Procedure Laterality Date  . ABDOMINAL HYSTERECTOMY  1991  . APPENDECTOMY  91  . BILATERAL OOPHORECTOMY  01/2003  . BREAST SURGERY Left 2004   lumpectomy  . CARPAL TUNNEL RELEASE     right  . JOINT REPLACEMENT     right total knee  . MASTECTOMY W/ SENTINEL NODE BIOPSY Right 10/26/2015  . MASTECTOMY W/ SENTINEL NODE BIOPSY Bilateral 10/26/2015   Procedure: RIGHT TOTAL MASTECTOMY WITH RIGHT SENTINEL LYMPH NODE BIOPSY, INJECT BLUE DYE RIGHT BREAST, LEFT BREAST PROPHYLACTIC MASTECTOMY;  Surgeon: Fanny Skates, MD;  Location: [facility];  Service: General;  Laterality: Bilateral;  . NASAL SEPTUM SURGERY  08  . PILONIDAL CYST EXCISION    . THYROIDECTOMY, PARTIAL  mid 80's   right side   Allergies  Allergen Reactions  . Chantix [Varenicline] Other (See Comments)    "Felt like having mental breakdown"  . Ciprofloxacin Other (See Comments)    Muscle tightness, tendon aching and pain  . Penicillins Swelling  . Tamoxifen Other (See Comments)    Blood clots  . Lyrica [Pregabalin] Swelling  . Neurontin [Gabapentin] Swelling  . Morphine And Related Other (See Comments)    MORPHINE DRIP - causes pt to feel irritable and itch  . Ceclor [Cefaclor] Rash  . Other Other (See Comments)    Redness from tape and bandaides  . Sulfa Antibiotics Rash   Prior to Admission medications   Medication Sig Start Date End Date Taking? Authorizing Provider  albuterol (PROVENTIL HFA;VENTOLIN HFA) 108 (90 BASE) MCG/ACT inhaler Inhale 2 puffs into the lungs every 4 (four) hours as needed for wheezing or shortness of breath (cough, shortness of breath or wheezing.). 08/09/13   [name], MD  Blood Pressure Monitoring (ADULT BLOOD PRESSURE CUFF LG) KIT Use as directed 07/28/15   [name], MD  cholecalciferol (VITAMIN D) 1000 UNITS tablet Take 1,000 Units by mouth daily.    Historical Provider, MD  cyclobenzaprine (FLEXERIL) 10 MG tablet Take 1 tablet (10 mg total) by mouth 3 (three) times daily as  needed for muscle spasms. 07/28/15   [name], MD  DULoxetine (CYMBALTA) 30 MG capsule Take 3 capsules (90 mg total) by mouth daily. 09/07/15   [name], MD  esomeprazole (NEXIUM) 20 MG capsule Take 20 mg by mouth daily at 12 noon.    Historical Provider, MD  fenofibrate 160 MG tablet Take 1 tablet (160 mg total) by mouth daily. 06/28/15   Gay Filler Copland, MD  hydrochlorothiazide (HYDRODIURIL) 25 MG tablet TAKE 1 TABLET (25 MG TOTAL) BY MOUTH DAILY. 07/28/15   [name], MD  ibuprofen (ADVIL,MOTRIN) 200 MG tablet Take 800 mg by mouth at bedtime.    Historical Provider, MD  levofloxacin (LEVAQUIN) 500 MG tablet Take 1 tablet (500 mg total) by mouth daily. 12/03/15   [name], MD  levothyroxine (SYNTHROID, LEVOTHROID) 25 MCG  tablet Take 1 tablet (25 mcg total) by mouth daily. 09/07/15   [name], MD  metFORMIN (GLUCOPHAGE) 500 MG tablet Take 1 tablet (500 mg total) by mouth 2 (two) times daily with a meal. 09/07/15   [name], MD  metoprolol (LOPRESSOR) 50 MG tablet TAKE 1 TABLET (50 MG TOTAL) BY MOUTH 2 (TWO) TIMES DAILY. 12/20/14   Darreld Mclean, MD  Multiple Vitamins-Minerals (MULTIVITAMIN WITH MINERALS) tablet Take 1 tablet by mouth daily.    Historical Provider, MD  temazepam (RESTORIL) 7.5 MG capsule Take 1 capsule (7.5 mg total) by mouth at bedtime as needed for sleep. 07/28/15   [name], MD   Social History   Social History  . Marital status: Married    Spouse name: N/A  . Number of children: N/A  . Years of education: N/A   Occupational History  . Not on file.   Social History Main Topics  . Smoking status: Former Smoker    Packs/day: 0.50    Years: 42.00    Types: Cigarettes    Quit date: 10/30/2015  . Smokeless tobacco: Never Used  . Alcohol use No  . Drug use: No  . Sexual activity: No   Other Topics Concern  . Not on file   Social History Narrative  . No narrative on file    Depression screen [facility] 2/9 12/06/2015 12/03/2015 11/29/2015 11/05/2015 07/28/2015    Decreased Interest 0 0 0 0 0  Down, Depressed, Hopeless 0 0 0 0 1  PHQ - 2 Score 0 0 0 0 1   Review of Systems  Constitutional: Negative for chills, diaphoresis and fever.  Respiratory: Negative for cough and shortness of breath.   Cardiovascular: Negative for chest pain.  Gastrointestinal: Negative for abdominal distention and abdominal pain.  Musculoskeletal: Positive for myalgias.  Skin: Negative for color change and rash.  Neurological: Negative for weakness and numbness.   Objective:  Physical Exam  Constitutional: She appears well-developed and well-nourished. No distress.  HENT:  Head: Normocephalic and atraumatic.  Eyes: Conjunctivae are normal.  Neck: Neck supple.  Cardiovascular: Normal rate.   Pulmonary/Chest: Effort normal.  Mild erythema around the left aspect of the left mastectomy wound. 5 cm of induration extending below incision site with some mild warmth but with decreased tenderness  Neurological: She is alert.  Skin: Skin is warm and dry.  Psychiatric: She has a normal mood and affect. Her behavior is normal.  Nursing note and vitals reviewed.  BP 126/80   Pulse 96   Temp 98.5 F (36.9 C) (Oral)   Resp 18   Ht '5\' 4"'  (1.626 m)   Wt 236 lb (107 kg)   SpO2 99%   BMI 40.51 kg/m  Assessment & Plan:   1. Cellulitis and abscess of trunk    Doing remarkably well.  Day 3/3 of Rocephin today. Cont levaquin. Recheck in 6d with Ivar Drape, If can't tolerate levaquin, RTC to see if we need to restart Rocephin. Ok to start PT for lymphedema treatment but no stretching or strengthening exercises due to levaquin use and prior myalgias on cipro.   I personally performed the services described in this documentation, which was scribed in my presence. The recorded information has been reviewed and considered, and addended by me as needed.   [name], M.D.  [facility] [address] [place], [place] [address] [phone]  phone [phone] fax  12/06/15 10:41 AM

## 2015-12-06 NOTE — Patient Instructions (Addendum)
If you have any problems with the levaquin, please stop it and come back in for additional antibiotic injections if we think there is any infection left still. Ask PT if they can help with lymphedema work but nothing taxing on your muscles/tendons/ligaments.  Call on Saturday after 2 pm to get an appointment on Monday with Colletta [place] English.    IF you received an x-ray today, you will receive an invoice from [facility]. Please contact [facility] at [phone] with questions or concerns regarding your invoice.   IF you received labwork today, you will receive an invoice from [employer]. Please contact [facility] at [phone] with questions or concerns regarding your invoice.   Our billing staff will not be able to assist you with questions regarding bills from these companies.  You will be contacted with the lab results as soon as they are available. The fastest way to get your results is to activate your My Chart account. Instructions are located on the last page of this paperwork. If you have not heard from [place] regarding the results in 2 weeks, please contact this office.     Cellulitis Cellulitis is an infection of the skin and the tissue beneath it. The infected area is usually red and tender. Cellulitis occurs most often in the arms and lower legs.  CAUSES  Cellulitis is caused by bacteria that enter the skin through cracks or cuts in the skin. The most common types of bacteria that cause cellulitis are staphylococci and streptococci. SIGNS AND SYMPTOMS   Redness and warmth.  Swelling.  Tenderness or pain.  Fever. DIAGNOSIS  Your health care provider can usually determine what is wrong based on a physical exam. Blood tests may also be done. TREATMENT  Treatment usually involves taking an antibiotic medicine. HOME CARE INSTRUCTIONS   Take your antibiotic medicine as directed by your health care provider. Finish the antibiotic  even if you start to feel better.  Keep the infected arm or leg elevated to reduce swelling.  Apply a warm cloth to the affected area up to 4 times per day to relieve pain.  Take medicines only as directed by your health care provider.  Keep all follow-up visits as directed by your health care provider. SEEK MEDICAL CARE IF:   You notice red streaks coming from the infected area.  Your red area gets larger or turns dark in color.  Your bone or joint underneath the infected area becomes painful after the skin has healed.  Your infection returns in the same area or another area.  You notice a swollen bump in the infected area.  You develop new symptoms.  You have a fever. SEEK IMMEDIATE MEDICAL CARE IF:   You feel very sleepy.  You develop vomiting or diarrhea.  You have a general ill feeling (malaise) with muscle aches and pains.   This information is not intended to replace advice given to you by your health care provider. Make sure you discuss any questions you have with your health care provider.   Document Released: 01/31/2005 Document Revised: 01/12/2015 Document Reviewed: 07/09/2011 Elsevier Interactive Patient Education [employer].

## 2015-12-08 ENCOUNTER — Encounter: Payer: Self-pay | Admitting: Family Medicine

## 2015-12-09 ENCOUNTER — Inpatient Hospital Stay (HOSPITAL_COMMUNITY): Payer: 59

## 2015-12-09 ENCOUNTER — Encounter (HOSPITAL_COMMUNITY): Payer: Self-pay | Admitting: General Practice

## 2015-12-09 ENCOUNTER — Observation Stay (HOSPITAL_COMMUNITY)
Admission: AD | Admit: 2015-12-09 | Discharge: 2015-12-11 | DRG: 920 | Disposition: A | Discharge status: Home / Self Care | Payer: 59 | Source: Ambulatory Visit | Attending: Family Medicine | Admitting: Family Medicine

## 2015-12-09 ENCOUNTER — Ambulatory Visit (INDEPENDENT_AMBULATORY_CARE_PROVIDER_SITE_OTHER): Payer: 59 | Admitting: Emergency Medicine

## 2015-12-09 VITALS — BP 118/74 | HR 69 | Temp 98.0°F | Resp 18 | Ht 64.0 in | Wt 232.8 lb

## 2015-12-09 DIAGNOSIS — Z881 Allergy status to other antibiotic agents status: Secondary | ICD-10-CM

## 2015-12-09 DIAGNOSIS — L03319 Cellulitis of trunk, unspecified: Secondary | ICD-10-CM

## 2015-12-09 DIAGNOSIS — K219 Gastro-esophageal reflux disease without esophagitis: Secondary | ICD-10-CM | POA: Diagnosis present

## 2015-12-09 DIAGNOSIS — D0511 Intraductal carcinoma in situ of right breast: Secondary | ICD-10-CM | POA: Diagnosis present

## 2015-12-09 DIAGNOSIS — M71812 Other specified bursopathies, left shoulder: Secondary | ICD-10-CM | POA: Diagnosis present

## 2015-12-09 DIAGNOSIS — L03313 Cellulitis of chest wall: Secondary | ICD-10-CM

## 2015-12-09 DIAGNOSIS — E1142 Type 2 diabetes mellitus with diabetic polyneuropathy: Secondary | ICD-10-CM | POA: Diagnosis present

## 2015-12-09 DIAGNOSIS — Z9013 Acquired absence of bilateral breasts and nipples: Secondary | ICD-10-CM

## 2015-12-09 DIAGNOSIS — J449 Chronic obstructive pulmonary disease, unspecified: Secondary | ICD-10-CM | POA: Diagnosis present

## 2015-12-09 DIAGNOSIS — E669 Obesity, unspecified: Secondary | ICD-10-CM | POA: Diagnosis present

## 2015-12-09 DIAGNOSIS — M797 Fibromyalgia: Secondary | ICD-10-CM | POA: Diagnosis present

## 2015-12-09 DIAGNOSIS — Z825 Family history of asthma and other chronic lower respiratory diseases: Secondary | ICD-10-CM

## 2015-12-09 DIAGNOSIS — L0291 Cutaneous abscess, unspecified: Secondary | ICD-10-CM | POA: Diagnosis present

## 2015-12-09 DIAGNOSIS — F329 Major depressive disorder, single episode, unspecified: Secondary | ICD-10-CM | POA: Diagnosis present

## 2015-12-09 DIAGNOSIS — I10 Essential (primary) hypertension: Secondary | ICD-10-CM | POA: Diagnosis present

## 2015-12-09 DIAGNOSIS — L02219 Cutaneous abscess of trunk, unspecified: Secondary | ICD-10-CM | POA: Diagnosis not present

## 2015-12-09 DIAGNOSIS — M25512 Pain in left shoulder: Secondary | ICD-10-CM

## 2015-12-09 DIAGNOSIS — M79602 Pain in left arm: Secondary | ICD-10-CM | POA: Diagnosis present

## 2015-12-09 DIAGNOSIS — Z880 Allergy status to penicillin: Secondary | ICD-10-CM

## 2015-12-09 DIAGNOSIS — N6451 Induration of breast: Secondary | ICD-10-CM

## 2015-12-09 DIAGNOSIS — Z8262 Family history of osteoporosis: Secondary | ICD-10-CM

## 2015-12-09 DIAGNOSIS — Z79899 Other long term (current) drug therapy: Secondary | ICD-10-CM

## 2015-12-09 DIAGNOSIS — Z885 Allergy status to narcotic agent status: Secondary | ICD-10-CM

## 2015-12-09 DIAGNOSIS — Z7984 Long term (current) use of oral hypoglycemic drugs: Secondary | ICD-10-CM

## 2015-12-09 DIAGNOSIS — I739 Peripheral vascular disease, unspecified: Secondary | ICD-10-CM | POA: Diagnosis present

## 2015-12-09 DIAGNOSIS — M8580 Other specified disorders of bone density and structure, unspecified site: Secondary | ICD-10-CM | POA: Diagnosis present

## 2015-12-09 DIAGNOSIS — E785 Hyperlipidemia, unspecified: Secondary | ICD-10-CM | POA: Diagnosis present

## 2015-12-09 DIAGNOSIS — K760 Fatty (change of) liver, not elsewhere classified: Secondary | ICD-10-CM | POA: Diagnosis present

## 2015-12-09 DIAGNOSIS — Z841 Family history of disorders of kidney and ureter: Secondary | ICD-10-CM

## 2015-12-09 DIAGNOSIS — M67814 Other specified disorders of tendon, left shoulder: Secondary | ICD-10-CM | POA: Diagnosis present

## 2015-12-09 DIAGNOSIS — G4733 Obstructive sleep apnea (adult) (pediatric): Secondary | ICD-10-CM | POA: Diagnosis present

## 2015-12-09 DIAGNOSIS — Z818 Family history of other mental and behavioral disorders: Secondary | ICD-10-CM

## 2015-12-09 DIAGNOSIS — Z8249 Family history of ischemic heart disease and other diseases of the circulatory system: Secondary | ICD-10-CM

## 2015-12-09 DIAGNOSIS — Z801 Family history of malignant neoplasm of trachea, bronchus and lung: Secondary | ICD-10-CM

## 2015-12-09 DIAGNOSIS — Z6839 Body mass index (BMI) 39.0-39.9, adult: Secondary | ICD-10-CM

## 2015-12-09 DIAGNOSIS — Z882 Allergy status to sulfonamides status: Secondary | ICD-10-CM

## 2015-12-09 DIAGNOSIS — Z87891 Personal history of nicotine dependence: Secondary | ICD-10-CM

## 2015-12-09 DIAGNOSIS — Z90710 Acquired absence of both cervix and uterus: Secondary | ICD-10-CM

## 2015-12-09 DIAGNOSIS — E039 Hypothyroidism, unspecified: Secondary | ICD-10-CM | POA: Diagnosis present

## 2015-12-09 DIAGNOSIS — F419 Anxiety disorder, unspecified: Secondary | ICD-10-CM | POA: Diagnosis present

## 2015-12-09 DIAGNOSIS — Z806 Family history of leukemia: Secondary | ICD-10-CM

## 2015-12-09 DIAGNOSIS — Z807 Family history of other malignant neoplasms of lymphoid, hematopoietic and related tissues: Secondary | ICD-10-CM

## 2015-12-09 DIAGNOSIS — L7634 Postprocedural seroma of skin and subcutaneous tissue following other procedure: Principal | ICD-10-CM | POA: Diagnosis present

## 2015-12-09 DIAGNOSIS — L0390 Cellulitis, unspecified: Secondary | ICD-10-CM | POA: Diagnosis present

## 2015-12-09 DIAGNOSIS — Z800 Family history of malignant neoplasm of digestive organs: Secondary | ICD-10-CM

## 2015-12-09 DIAGNOSIS — Z888 Allergy status to other drugs, medicaments and biological substances status: Secondary | ICD-10-CM

## 2015-12-09 DIAGNOSIS — Y836 Removal of other organ (partial) (total) as the cause of abnormal reaction of the patient, or of later complication, without mention of misadventure at the time of the procedure: Secondary | ICD-10-CM | POA: Diagnosis present

## 2015-12-09 DIAGNOSIS — Z853 Personal history of malignant neoplasm of breast: Secondary | ICD-10-CM

## 2015-12-09 DIAGNOSIS — Z808 Family history of malignant neoplasm of other organs or systems: Secondary | ICD-10-CM

## 2015-12-09 DIAGNOSIS — Z803 Family history of malignant neoplasm of breast: Secondary | ICD-10-CM

## 2015-12-09 DIAGNOSIS — M1990 Unspecified osteoarthritis, unspecified site: Secondary | ICD-10-CM | POA: Diagnosis present

## 2015-12-09 HISTORY — DX: Cellulitis of chest wall: L03.313

## 2015-12-09 LAB — CBC
HEMATOCRIT: 33.6 % — AB (ref 36.0–46.0)
Hemoglobin: 11.2 g/dL — ABNORMAL LOW (ref 12.0–15.0)
MCH: 27.7 pg (ref 26.0–34.0)
MCHC: 33.3 g/dL (ref 30.0–36.0)
MCV: 83 fL (ref 78.0–100.0)
Platelets: 298 10*3/uL (ref 150–400)
RBC: 4.05 MIL/uL (ref 3.87–5.11)
RDW: 12.4 % (ref 11.5–15.5)
WBC: 5.8 10*3/uL (ref 4.0–10.5)

## 2015-12-09 LAB — COMPREHENSIVE METABOLIC PANEL
ALBUMIN: 3.7 g/dL (ref 3.5–5.0)
ALT: 29 U/L (ref 14–54)
AST: 25 U/L (ref 15–41)
Alkaline Phosphatase: 52 U/L (ref 38–126)
Anion gap: 10 (ref 5–15)
BILIRUBIN TOTAL: 0.7 mg/dL (ref 0.3–1.2)
BUN: 14 mg/dL (ref 6–20)
CHLORIDE: 100 mmol/L — AB (ref 101–111)
CO2: 26 mmol/L (ref 22–32)
CREATININE: 0.91 mg/dL (ref 0.44–1.00)
Calcium: 9.3 mg/dL (ref 8.9–10.3)
GFR calc Af Amer: >60 mL/min (ref >60)
GFR calc non Af Amer: >60 mL/min (ref >60)
GLUCOSE: 93 mg/dL (ref 65–99)
POTASSIUM: 3.5 mmol/L (ref 3.5–5.1)
Sodium: 136 mmol/L (ref 135–145)
TOTAL PROTEIN: 6.5 g/dL (ref 6.5–8.1)

## 2015-12-09 LAB — GLUCOSE, CAPILLARY
Glucose-Capillary: 96 mg/dL (ref 65–99)
Glucose-Capillary: 138 mg/dL — ABNORMAL HIGH (ref 65–99)

## 2015-12-09 MED ORDER — ALBUTEROL SULFATE (2.5 MG/3ML) 0.083% IN NEBU: 3.0000 mL | INHALATION_SOLUTION | RESPIRATORY_TRACT | Status: DC | PRN

## 2015-12-09 MED ORDER — INSULIN ASPART 100 UNIT/ML ~~LOC~~ SOLN
0.0000 [IU] | Freq: Three times a day (TID) | SUBCUTANEOUS | Status: DC
  Administered 2015-12-10 – 2015-12-11 (×2): 1 [IU] via SUBCUTANEOUS

## 2015-12-09 MED ORDER — INSULIN ASPART 100 UNIT/ML ~~LOC~~ SOLN: 0.0000 [IU] | Freq: Every day | SUBCUTANEOUS | Status: DC

## 2015-12-09 MED ORDER — VITAMIN D 1000 UNITS PO TABS
1000.0000 [IU] | ORAL_TABLET | Freq: Every day | ORAL | Status: DC
  Administered 2015-12-10 – 2015-12-11 (×2): 1000 [IU] via ORAL
  Filled 2015-12-09 (×3): qty 1

## 2015-12-09 MED ORDER — CYCLOBENZAPRINE HCL 10 MG PO TABS
10.0000 mg | ORAL_TABLET | Freq: Three times a day (TID) | ORAL | Status: DC | PRN
  Administered 2015-12-09 – 2015-12-10 (×2): 10 mg via ORAL
  Filled 2015-12-09 (×2): qty 1

## 2015-12-09 MED ORDER — DULOXETINE HCL 60 MG PO CPEP
90.0000 mg | ORAL_CAPSULE | Freq: Every day | ORAL | Status: DC
  Administered 2015-12-10 – 2015-12-11 (×2): 90 mg via ORAL
  Filled 2015-12-09 (×2): qty 1

## 2015-12-09 MED ORDER — HYDROCHLOROTHIAZIDE 25 MG PO TABS
25.0000 mg | ORAL_TABLET | Freq: Every day | ORAL | Status: DC
  Administered 2015-12-10 – 2015-12-11 (×2): 25 mg via ORAL
  Filled 2015-12-09 (×2): qty 1

## 2015-12-09 MED ORDER — METOPROLOL TARTRATE 50 MG PO TABS
50.0000 mg | ORAL_TABLET | Freq: Two times a day (BID) | ORAL | Status: DC
  Administered 2015-12-09 – 2015-12-11 (×4): 50 mg via ORAL
  Filled 2015-12-09 (×4): qty 1

## 2015-12-09 MED ORDER — DEXTROSE 5 % IV SOLN
2.0000 g | Freq: Every day | INTRAVENOUS | Status: DC
  Filled 2015-12-09: qty 2

## 2015-12-09 MED ORDER — ACETAMINOPHEN 650 MG RE SUPP: 650.0000 mg | Freq: Four times a day (QID) | RECTAL | Status: DC | PRN

## 2015-12-09 MED ORDER — POLYETHYLENE GLYCOL 3350 17 G PO PACK: 17.0000 g | PACK | Freq: Every day | ORAL | Status: DC | PRN

## 2015-12-09 MED ORDER — PANTOPRAZOLE SODIUM 40 MG PO TBEC
40.0000 mg | DELAYED_RELEASE_TABLET | Freq: Every day | ORAL | Status: DC
  Administered 2015-12-10 – 2015-12-11 (×2): 40 mg via ORAL
  Filled 2015-12-09 (×3): qty 1

## 2015-12-09 MED ORDER — ACETAMINOPHEN 325 MG PO TABS
650.0000 mg | ORAL_TABLET | Freq: Four times a day (QID) | ORAL | Status: DC | PRN
  Administered 2015-12-09 – 2015-12-10 (×3): 650 mg via ORAL
  Filled 2015-12-09 (×3): qty 2

## 2015-12-09 MED ORDER — ENOXAPARIN SODIUM 40 MG/0.4ML ~~LOC~~ SOLN
40.0000 mg | SUBCUTANEOUS | Status: DC
  Administered 2015-12-09 – 2015-12-10 (×2): 40 mg via SUBCUTANEOUS
  Filled 2015-12-09 (×2): qty 0.4

## 2015-12-09 MED ORDER — LEVOTHYROXINE SODIUM 25 MCG PO TABS
25.0000 ug | ORAL_TABLET | Freq: Every day | ORAL | Status: DC
  Administered 2015-12-10 – 2015-12-11 (×2): 25 ug via ORAL
  Filled 2015-12-09 (×2): qty 1

## 2015-12-09 MED ORDER — TEMAZEPAM 7.5 MG PO CAPS
7.5000 mg | ORAL_CAPSULE | Freq: Every evening | ORAL | Status: DC | PRN
  Administered 2015-12-10: 7.5 mg via ORAL
  Filled 2015-12-09: qty 1

## 2015-12-09 MED ORDER — CEFTRIAXONE SODIUM 1 G IJ SOLR: 1.0000 g | Freq: Once | INTRAMUSCULAR | Status: DC

## 2015-12-09 NOTE — Progress Notes (Signed)
Family Medicine Teaching Service Daily Progress Note Intern Pager: [phone]  Patient name: Susan [name] Medical record number: [id number] Date of birth: October 14, 1950 Age: 65 y.o. Gender: female  Primary Care Provider: [name], MD Consultants: None Code Status: Full (per discussion on admission)  Chief Complaint: cellulitis, L arm pain  Assessment and Plan: Susan [name] is a 65 y.o. female who is s/p B/L mastectomy in June 2017 presenting with persistent cellulitis and L arm pain . PMH is significant for Bilateral breast cancer, Fibromyalgia, T2DM, Sleep Apnea, OA, HTN, HLD, Fatty liver dz, COPD, anxiety, hypothyroidism, GERD, DVT.  Cellulitis and possible abscess vs seroma on L chest. + Serratia on wound cx : S/p bilateral masectomy on 6/21 as below. Initially had cellulitis on R mastectomy site, was given Doxy x 22 days for persistent cellulitis, then cellulitis developed on L mastectomy site. Wound cx from drainage bulb grew doxy resistant Serratia. Was then given CTX x 3 days and Levaquin x 6 days but continued to have pain and then developed L arm pain (see below assessment). Likely failed outpatient antibiotic therapy vs insufficient course of antibiotics. Has been afebrile and non septic appearing. No leukocytosis. Soft tissue U/S of left chest confirmed abscess vs seroma. - Vital signs per floor protocol - IV CTX (listed in allergies but patient has taken this before without any reaction) - Call surgery today for possible drainage - Blood cx pending  Left arm pain: Seems MSK in nature on exam. R/o DVT vs tendonopathy from Levaquin vs muscle strain vs adhesive capsulitis vs rotator cuff syndrome. Could also be due to spinal process. Patient states she has hx of neck pain. U/S of LUE negative for DVT - Tylenol PRN - May need U/S or MRI of shoulder - Consider XRAY of c-spine  Bilateral Breast Cancer, s/p elective B/L mastectomy on 6/21: Underwent left lumpectomy and adjuvant  radiation therapy for ductal carcinoma in situ left breast in 2004. Took tamoxifen for 3 weeks and developed DVT so that was discontinued. She did well thereafter but was found to have intermediate to high-grade ductal carcinoma in situ of the right breast in April. She elected to have bilateral mastectomy. Pathology report: left breast had some atypical hyperplasia but no cancer, right breast had residual ductal carcinoma in situ, but all the lymph nodes were negative.   HTN: Currently normotensive - Home HCTZ, Metoprolol  HLD - Hold home fenofibrate  GERD - Protonix  T2DM: Now pre-diabetes according to A1C of 6 on 10/18/15 - Sensitive SSI - Hold home Metformin  Fibromyalgia:  -Home Flexeril and Cymbalta  COPD - Home Albuterol PRN  Mediastinal lymphadenopathy: Found on CT scan in 2016 - Will need repeat scan in outpatient setting.   Sleep Apnea - CPAP qhs  Anxiety - Home Restoril   Hypothyroidism - Home Synthroid  FEN/GI:  Regular diet/ saline lock Prophylaxis: Lovenox  Disposition: Continue to monitor on med surg floor  Subjective:   No acute events overnight. No complaints this morning. Patient states she slept well. Denies any significant pain or tenderness on chest. Continues to have pain in arm and shoulder with movement.    Objective: BP 122/66 (BP Location: Left Arm)   Pulse 65   Temp 98.8 F (37.1 C) (Oral)   Resp 17   SpO2 96%  Exam: General: Pleasant female, in NAD, obese Cardiovascular: regular rate and rhythm, no murmurs, no edema Respiratory: normal work of breathing, clear to auscultation bilaterally Abdomen: soft, non distended, non tender,  normal bowel sounds MSK: normal muscle mass and bulk. No pain upon palpation of RUE or LUE. No pain or tenderness in bilateral lower extremities.  -Shoulder exam: Left;  no swelling, tenderness, instability; ligaments intact, reduced range of motion and pain upon left arm abduction (limited to 60 degrees).  Right shoulder exam is normal. Skin: No rash. Scar on R knee. 2 healing drain scars on right, 1 healing drain scar on left. Bilateral horizontal mastectomy scars, well healed. Left chest erythema and swelling improved, fluctuant mass has decreased in size on lateral aspect of left scar.  Neuro: Alert and oriented x 3 Psych: Normal mood and affect    Labs and Imaging: CBC BMET   Recent Labs Lab 12/10/15 0545  WBC 4.9  HGB 11.1*  HCT 33.3*  PLT 285    Recent Labs Lab 12/10/15 0545  NA 139  K 4.4  CL 102  CO2 27  BUN 13  CREATININE 0.85  GLUCOSE 114*  CALCIUM 9.4     [place] Chest  Result Date: 12/09/2015 CLINICAL DATA:  Patient with recent bilateral mastectomies. Cellulitis at the mastectomy site. EXAM: CHEST ULTRASOUND COMPARISON:  None. FINDINGS: Within the anterior left chest wall underlying the incision site there is a 9.3 x 2.4 x 7.2 cm complex cystic structure with internal septations in soft tissue. Overlying skin thickening. IMPRESSION: Large complex cystic structure underlying the incision site within the left anterior chest wall potentially representing postoperative seroma or abscess. Electronically Signed   By: Lovey Newcomer M.D.   On: 12/09/2015 21:13    Carlyle Dolly, MD 12/10/2015, 8:54 AM PGY-2, [place] Intern pager: [phone], text pages welcome

## 2015-12-09 NOTE — Progress Notes (Signed)
Subjective:  This chart was scribed for Susan [name], by Susan [name], at [facility].  This patient was seen in room  4 and the patient's care was started at 12:43 PM.   Chief Complaint  Patient presents with   Other    post surgical infection follow up     Patient ID: Susan [name], female    DOB: Jul 22, 1950, 65 y.o.   MRN: [id number]  HPI HPI Comments: Susan [name] is a 65 y.o. female who presents to the [facility] for a follow up regarding her post surgical infection and a pain in her left arm which radiates to her shoulder onset yesterday.  She has limited range of motion in her left arm due to this.   Patient had a bilateral mastectomy 6 weeks ago. She was on antibiotics for 22 days but had 4 flare up of cellulitis.  Dr. Brigitte [name] did a culture and the drainage (revealing serratia)  was resistant to doxycycline.  She was put on Levaquin and given 3 Rocephin injections.  She has been compliant with taking her medications but would no longer like to take the Levaquin.     Patient Active Problem List   Diagnosis Date Noted   Cancer of central portion of right breast ([place]) 10/26/2015   Genetic testing 10/17/2015   History of breast cancer in female 09/15/2015   Breast cancer of upper-outer quadrant of right female breast ([name]) 09/15/2015   Family history of breast cancer in female 09/15/2015   Family history of colon cancer 09/15/2015   TB lung, latent 01/11/2015   COPD (chronic obstructive pulmonary disease) ([place]) 10/07/2014   Mediastinal lymphadenopathy 10/07/2014   Diabetes mellitus type 2, controlled ([place]) 09/29/2014   Pulmonary nodule 09/29/2014   History of chest pain 09/29/2014   Essential hypertension 09/29/2014   Chest pain 09/23/2014   Hypothyroidism 02/14/2014   Tobacco abuse-unspec 10/12/2013   Hip pain, right 10/12/2013   Cervical cancer screening 03/09/2013   Seasonal allergies  09/12/2012   Nasal polyp 08/30/2012   SOM (serous otitis media) 08/30/2012   Dysuria 07/20/2012   Peripheral neuropathy ([place]) 07/19/2012   Cervical myofascial pain syndrome 06/08/2012   Anemia 01/28/2012   HTN (hypertension) 01/28/2012   Hyperlipidemia 01/28/2012   Hyperglycemia 01/28/2012   Preventative health care 01/28/2012   Pain of left heel 01/28/2012   Neck pain 12/30/2011   Muscle spasm 12/30/2011   Anxiety and depression    Osteoarthritis    Cancer ([place])    GERD (gastroesophageal reflux disease)    Fibromyalgia    Sleep apnea    Obesity    History of blood clots    Fatty liver disease, nonalcoholic    DDD (degenerative disc disease)    Past Medical History:  Diagnosis Date   Anemia 01/28/2012   Anxiety    Cancer ([name]) 2004   ductal carcinoma; lumpectomy   COPD (chronic obstructive pulmonary disease) (H[facility])    mild   DDD (degenerative disc disease)    Depression    Diabetes mellitus without complication ([place])    pre rx   DJD (degenerative joint disease)    Dysrhythmia    hx palpitations?panic attack   Fatty liver disease, nonalcoholic    Fibromyalgia    GERD (gastroesophageal reflux disease)    Hip pain, right 10/12/2013   History of blood clots 2004   during cancer treatment   HTN (hypertension) 01/28/2012   Hyperglycemia 01/28/2012  Hyperlipidemia 01/28/2012   Hypothyroidism 02/14/2014   Muscle spasm 12/30/2011   Nasal polyp 08/30/2012   Obesity    Osteoarthritis    Osteopenia    Pain of left heel 01/28/2012   Peripheral neuropathy (H[facility])    Peripheral vascular disease ([place]) 04   axillary,upper chest after 3 weeks on tamoxifen   Pneumonia    hx   Preventative health care 01/28/2012   Shortness of breath dyspnea    occ on exersion   Sleep apnea    on CPAP   Tobacco abuse-unspec 10/12/2013   Tuberculosis    latent  finishing 9 months tx on last month   Past Surgical History:  Procedure Laterality  Date   ABDOMINAL HYSTERECTOMY  1991   APPENDECTOMY  91   BILATERAL OOPHORECTOMY  01/2003   BREAST SURGERY Left 2004   lumpectomy   CARPAL TUNNEL RELEASE     right   JOINT REPLACEMENT     right total knee   MASTECTOMY W/ SENTINEL NODE BIOPSY Right 10/26/2015   MASTECTOMY W/ SENTINEL NODE BIOPSY Bilateral 10/26/2015   Procedure: RIGHT TOTAL MASTECTOMY WITH RIGHT SENTINEL LYMPH NODE BIOPSY, INJECT BLUE DYE RIGHT BREAST, LEFT BREAST PROPHYLACTIC MASTECTOMY;  Surgeon: Susan Skates, [name];  Location: [facility];  Service: General;  Laterality: Bilateral;   NASAL SEPTUM SURGERY  08   PILONIDAL CYST EXCISION     THYROIDECTOMY, PARTIAL  mid 80's   right side   Allergies  Allergen Reactions   Chantix [Varenicline] Other (See Comments)    "Felt like having mental breakdown"   Ciprofloxacin Other (See Comments)    Muscle tightness, tendon aching and pain   Penicillins Swelling   Tamoxifen Other (See Comments)    Blood clots   Lyrica [Pregabalin] Swelling   Neurontin [Gabapentin] Swelling   Morphine And Related Other (See Comments)    MORPHINE DRIP - causes pt to feel irritable and itch   Ceclor [Cefaclor] Rash   Other Other (See Comments)    Redness from tape and bandaides   Sulfa Antibiotics Rash   Prior to Admission medications   Medication Sig Start Date End Date Taking? Authorizing Provider  albuterol (PROVENTIL HFA;VENTOLIN HFA) 108 (90 BASE) MCG/ACT inhaler Inhale 2 puffs into the lungs every 4 (four) hours as needed for wheezing or shortness of breath (cough, shortness of breath or wheezing.). 08/09/13  Yes Susan Russian, [name]  Blood Pressure Monitoring (ADULT BLOOD PRESSURE CUFF LG) KIT Use as directed 07/28/15  Yes Susan Knapp, [name]  cholecalciferol (VITAMIN D) 1000 UNITS tablet Take 1,000 Units by mouth daily.   Yes Historical Provider, [name]  cyclobenzaprine (FLEXERIL) 10 MG tablet Take 1 tablet (10 mg total) by mouth 3 (three) times daily as needed for muscle spasms.  07/28/15  Yes Susan Knapp, [name]  DULoxetine (CYMBALTA) 30 MG capsule Take 3 capsules (90 mg total) by mouth daily. 09/07/15  Yes Susan Knapp, [name]  esomeprazole (NEXIUM) 20 MG capsule Take 20 mg by mouth daily at 12 noon.   Yes Historical Provider, [name]  fenofibrate 160 MG tablet Take 1 tablet (160 mg total) by mouth daily. 06/28/15  Yes Jessica C Copland, [name]  hydrochlorothiazide (HYDRODIURIL) 25 MG tablet TAKE 1 TABLET (25 MG TOTAL) BY MOUTH DAILY. 07/28/15  Yes Susan Knapp, [name]  ibuprofen (ADVIL,MOTRIN) 200 MG tablet Take 800 mg by mouth at bedtime.   Yes Historical Provider, [name]  levofloxacin (LEVAQUIN) 500 MG tablet Take 1 tablet (500 mg total) by mouth  daily. 12/03/15  Yes Susan Knapp, [name]  levothyroxine (SYNTHROID, LEVOTHROID) 25 MCG tablet Take 1 tablet (25 mcg total) by mouth daily. 09/07/15  Yes Susan Knapp, [name]  metFORMIN (GLUCOPHAGE) 500 MG tablet Take 1 tablet (500 mg total) by mouth 2 (two) times daily with a meal. 09/07/15  Yes Susan Knapp, [name]  metoprolol (LOPRESSOR) 50 MG tablet TAKE 1 TABLET (50 MG TOTAL) BY MOUTH 2 (TWO) TIMES DAILY. 12/20/14  Yes Gay Filler Copland, [name]  Multiple Vitamins-Minerals (MULTIVITAMIN WITH MINERALS) tablet Take 1 tablet by mouth daily.   Yes Historical Provider, [name]  temazepam (RESTORIL) 7.5 MG capsule Take 1 capsule (7.5 mg total) by mouth at bedtime as needed for sleep. 07/28/15  Yes Susan Knapp, [name]   Social History   Social History   Marital status: Married    Spouse name: N/A   Number of children: N/A   Years of education: N/A   Occupational History   Not on file.   Social History Main Topics   Smoking status: Former Smoker    Packs/day: 0.50    Years: 42.00    Types: Cigarettes    Quit date: 10/30/2015   Smokeless tobacco: Never Used   Alcohol use No   Drug use: No   Sexual activity: No   Other Topics Concern   Not on file   Social History Narrative   No narrative on file      Review of Systems  Constitutional: Negative for chills and fever.    Eyes: Negative for pain, redness and itching.  Respiratory: Negative for cough, choking and shortness of breath.   Gastrointestinal: Negative for nausea and vomiting.  Musculoskeletal: Positive for myalgias.  Skin: Positive for wound.  Neurological: Negative for syncope and speech difficulty.       Objective:   Physical Exam  Vitals:   12/09/15 1141  BP: 118/74  [name]: 69  Resp: 18  Temp: 98 F (36.7 C)  TempSrc: Oral  SpO2: 97%  Weight: 232 lb 12.8 oz (105.6 kg)  Height: _0  (1.626 m)    CONSTITUTIONAL: Well developed/well nourished HEAD: Normocephalic/atraumatic EYES: EOMI/PERRL SPINE/BACK:entire spine nontender CV: S1/S2 noted, no murmurs/rubs/gallops noted LUNGS: Lungs are clear to auscultation bilaterally, no apparent distress NEURO: Pt is awake/alert/appropriate, moves all extremitiesx4.  No facial droop.   EXTREMITIES: there is significant pain with elevation of the left arm.  SKIN: bilateral mastectomy scars, fluctuant mass beneath the scar left anterior chest  PSYCH: no abnormalities of mood noted, alert and oriented to situation     Assessment & Plan:  I was afraid to give her a shot of Rocephin with her history of anaphylaxis to penicillin.. She has received Rocephin in the past.  She is developing a tendinitis around the left shoulder most likely from the  [place]. She has had problems with Cipro in the past. I discussed her case with Dr. Zella Richer on-call at [facility]. We agreed the safest treatment would be IV antibiotics ultrasound the left breast with consideration for aspiration.I personally performed the services described in this documentation, which was scribed in my presence. The recorded information has been reviewed and is accurate.  Susan Russian, [name]

## 2015-12-09 NOTE — Progress Notes (Signed)
Patient states that she does not need any help with her CPAP and will place it on when ready. RT informed her to let staff know if she has any problems. RT will continue to monitor as needed.

## 2015-12-10 ENCOUNTER — Inpatient Hospital Stay (HOSPITAL_COMMUNITY): Payer: 59

## 2015-12-10 DIAGNOSIS — N6451 Induration of breast: Secondary | ICD-10-CM

## 2015-12-10 DIAGNOSIS — M25512 Pain in left shoulder: Secondary | ICD-10-CM | POA: Diagnosis not present

## 2015-12-10 LAB — BASIC METABOLIC PANEL
Anion gap: 10 (ref 5–15)
BUN: 13 mg/dL (ref 6–20)
CHLORIDE: 102 mmol/L (ref 101–111)
CO2: 27 mmol/L (ref 22–32)
Calcium: 9.4 mg/dL (ref 8.9–10.3)
Creatinine, Ser: 0.85 mg/dL (ref 0.44–1.00)
GFR calc Af Amer: >60 mL/min (ref >60)
GFR calc non Af Amer: >60 mL/min (ref >60)
GLUCOSE: 114 mg/dL — AB (ref 65–99)
POTASSIUM: 4.4 mmol/L (ref 3.5–5.1)
SODIUM: 139 mmol/L (ref 135–145)

## 2015-12-10 LAB — CBC
HEMATOCRIT: 33.3 % — AB (ref 36.0–46.0)
HEMOGLOBIN: 11.1 g/dL — AB (ref 12.0–15.0)
MCH: 27.9 pg (ref 26.0–34.0)
MCHC: 33.3 g/dL (ref 30.0–36.0)
MCV: 83.7 fL (ref 78.0–100.0)
Platelets: 285 10*3/uL (ref 150–400)
RBC: 3.98 MIL/uL (ref 3.87–5.11)
RDW: 12.5 % (ref 11.5–15.5)
WBC: 4.9 10*3/uL (ref 4.0–10.5)

## 2015-12-10 LAB — GLUCOSE, CAPILLARY
Glucose-Capillary: 105 mg/dL — ABNORMAL HIGH (ref 65–99)
Glucose-Capillary: 99 mg/dL (ref 65–99)
Glucose-Capillary: 145 mg/dL — ABNORMAL HIGH (ref 65–99)
Glucose-Capillary: 125 mg/dL — ABNORMAL HIGH (ref 65–99)

## 2015-12-10 MED ORDER — EPINEPHRINE 0.3 MG/0.3ML IJ SOAJ
0.3000 mg | Freq: Once | INTRAMUSCULAR | Status: DC | PRN
  Filled 2015-12-10: qty 0.3

## 2015-12-10 MED ORDER — HYDROCODONE-ACETAMINOPHEN 5-325 MG PO TABS
1.0000 | ORAL_TABLET | Freq: Four times a day (QID) | ORAL | Status: DC | PRN
  Administered 2015-12-10 – 2015-12-11 (×3): 1 via ORAL
  Filled 2015-12-10 (×3): qty 1

## 2015-12-10 MED ORDER — DEXTROSE 5 % IV SOLN
2.0000 g | INTRAVENOUS | Status: DC
  Administered 2015-12-10 – 2015-12-11 (×2): 2 g via INTRAVENOUS
  Filled 2015-12-10 (×2): qty 2

## 2015-12-10 NOTE — H&P (Signed)
[facility] Admission History and Physical Service Pager: [phone]  Patient name: Susan [name]              Medical record number: [id number] Date of birth: [birth year]                Age: 65 y.o.              Gender: female  Primary Care Provider: [name], MD Consultants: None Code Status: Full (per discussion on admission)  Chief Complaint: cellulitis, L arm pain  Assessment and Plan: [name] is a 65 y.o. female who is s/p B/L mastectomy in June 2017 presenting with persistent cellulitis and L arm pain . PMH is significant for Bilateral breast cancer, Fibromyalgia, T2DM, Sleep Apnea, OA, HTN, HLD, Fatty liver dz, COPD, anxiety, hypothyroidism, GERD, DVT.  Cellulitis and possible abscess vs seroma on L chest. + Serratia on wound cx : S/p bilateral masectomy on 6/21 as below. Initially had cellulitis on R mastectomy site, was given Doxy x 22 days for persistent cellulitis, then cellulitis developed on L mastectomy site. Wound cx from drainage bulb grew doxy resistant Serratia. Was then given CTX x 3 days and Levaquin x 6 days but continued to have pain and then developed L arm pain (see below assessment). Likely failed outpatient antibiotic therapy vs insufficient course of antibiotics. Has been afebrile and non septic appearing. No leukocytosis, WBC 5.8.  - Admit to FPTS, med surg, attending Dr. Ree Kida - Vital signs per floor protocol - CBC, CMP - IV CTX (listed in allergies but patient has taken this before without any reaction) - Soft tissue U/S of left chest to examine fluctuance - Blood cx x 2 - Will likely need to consult surgery depending upon U/S results  Left arm pain: Seems MSK in nature on exam. R/o DVT vs tendonopathy from Levaquin vs muscle strain vs adhesive capsulitis vs rotator cuff syndrome.  - Tylenol PRN - May need U/S or MRI of shoulder  Bilateral Breast Cancer, s/p elective B/L mastectomy on 6/21: Underwent left  lumpectomy and adjuvant radiation therapy for ductal carcinoma in situ left breast in 2004. Took tamoxifen for 3 weeks and developed DVT so that was discontinued. She did well thereafter but was found to have intermediate to high-grade ductal carcinoma in situ of the right breast in April. She elected to have bilateral mastectomy. Pathology report: left breast had some atypical hyperplasia but no cancer, right breast had residual ductal carcinoma in situ, but all the lymph nodes were negative.   HTN: Currently normotensive - Home HCTZ, Metoprolol  HLD - Hold home fenofibrate  GERD - Protonix  T2DM: Now pre-diabetes according to A1C of 6 on 10/18/15 - Sensitive SSI - Hold home Metformin  Fibromyalgia:  -Home Flexeril and Cymbalta  COPD - Home Albuterol PRN  Mediastinal lymphadenopathy: Found on CT scan in 2016 - Will need repeat scan in outpatient setting.   Sleep Apnea - CPAP qhs  Anxiety - Home Restoril   Hypothyroidism - Home Synthroid  FEN/GI:  Regular diet/ saline lock Prophylaxis: Lovenox  Disposition: Admit to FPTS, med surg floor. Attending Dr. Ree Kida  History of Present Illness:  Susan [name] is a 65 y.o. female who is s/p B/L mastectomy in June presenting with cellulitis with possible abscess vs seroma and L arm pain   Patient had bilateral mastectomy on 6/21 due to breast cancer returning in her right breast (was initially in the left breast and  patient took Tamoxifen). She was seen by her surgeon Dr. Dalbert Batman and noted to have cellulitis on her R chest and was given Doxycycline. Doxycycline was continued for 22 days due to persistent fever and unresolved cellulitis. Patient noted that the right side of the chest seemed to get a little better but then the left side began draining pus from her drain. She then went to her PCP and a wound culture was obtained from fluid in her drainage bulb. The wound culture grew Serratia that was resistant to Doxy.  Doxy was stopped and Levaquin and CTX (3 day course) was started on 7/29. She continued to have pain and swelling near her left chest and then developed pain in her left arm which radiated to her shoulder on 12/07/15. The pain came on suddenly and she describes it as sharp. Denies any trauma.She had limited range of motion in her left armdue to this. She presented to her PCP today due to this and she was directly admitted to [facility] for IV abx and further work up.   Review Of Systems: Per HPI with the following additions: see HPI Otherwise the remainder of the systems were negative.      Patient Active Problem List   Diagnosis Date Noted  . Cancer of central portion of right breast ([place]) 10/26/2015  . Genetic testing 10/17/2015  . History of breast cancer in female 09/15/2015  . Breast cancer of upper-outer quadrant of right female breast ([place]) 09/15/2015  . Family history of breast cancer in female 09/15/2015  . Family history of colon cancer 09/15/2015  . TB lung, latent 01/11/2015  . COPD (chronic obstructive pulmonary disease) ([place]) 10/07/2014  . Mediastinal lymphadenopathy 10/07/2014  . Diabetes mellitus type 2, controlled ([place]) 09/29/2014  . Pulmonary nodule 09/29/2014  . History of chest pain 09/29/2014  . Essential hypertension 09/29/2014  . Chest pain 09/23/2014  . Hypothyroidism 02/14/2014  . Tobacco abuse-unspec 10/12/2013  . Hip pain, right 10/12/2013  . Cervical cancer screening 03/09/2013  . Seasonal allergies 09/12/2012  . Nasal polyp 08/30/2012  . SOM (serous otitis media) 08/30/2012  . Dysuria 07/20/2012  . Peripheral neuropathy ([place]) 07/19/2012  . Cervical myofascial pain syndrome 06/08/2012  . Anemia 01/28/2012  . HTN (hypertension) 01/28/2012  . Hyperlipidemia 01/28/2012  . Hyperglycemia 01/28/2012  . Preventative health care 01/28/2012  . Pain of left heel 01/28/2012  . Neck pain 12/30/2011  . Muscle spasm 12/30/2011  . Anxiety and depression   .  Osteoarthritis   . Cancer ([place])   . GERD (gastroesophageal reflux disease)   . Fibromyalgia   . Sleep apnea   . Obesity   . History of blood clots   . Fatty liver disease, nonalcoholic   . DDD (degenerative disc disease)     Past Medical History:     Past Medical History:  Diagnosis Date  . Anemia 01/28/2012  . Anxiety   . Cancer [facility]) 2004   ductal carcinoma; lumpectomy  . COPD (chronic obstructive pulmonary disease) (H[facility])    mild  . DDD (degenerative disc disease)   . Depression   . Diabetes mellitus without complication ([place])    pre rx  . DJD (degenerative joint disease)   . Dysrhythmia    hx palpitations?panic attack  . Fatty liver disease, nonalcoholic   . Fibromyalgia   . GERD (gastroesophageal reflux disease)   . Hip pain, right 10/12/2013  . History of blood clots 2004   during cancer treatment  . HTN (hypertension) 01/28/2012  .  Hyperglycemia 01/28/2012  . Hyperlipidemia 01/28/2012  . Hypothyroidism 02/14/2014  . Muscle spasm 12/30/2011  . Nasal polyp 08/30/2012  . Obesity   . Osteoarthritis   . Osteopenia   . Pain of left heel 01/28/2012  . Peripheral neuropathy ([place])   . Peripheral vascular disease ([place]) 04   axillary,upper chest after 3 weeks on tamoxifen  . Pneumonia    hx  . Preventative health care 01/28/2012  . Shortness of breath dyspnea    occ on exersion  . Sleep apnea    on CPAP  . Tobacco abuse-unspec 10/12/2013  . Tuberculosis    latent  finishing 9 months tx on last month    Past Surgical History:      Past Surgical History:  Procedure Laterality Date  . ABDOMINAL HYSTERECTOMY  1991  . APPENDECTOMY  91  . BILATERAL OOPHORECTOMY  01/2003  . BREAST SURGERY Left 2004   lumpectomy  . CARPAL TUNNEL RELEASE     right  . JOINT REPLACEMENT     right total knee  . MASTECTOMY W/ SENTINEL NODE BIOPSY Right 10/26/2015  . MASTECTOMY W/ SENTINEL NODE BIOPSY Bilateral 10/26/2015   Procedure: RIGHT  TOTAL MASTECTOMY WITH RIGHT SENTINEL LYMPH NODE BIOPSY, INJECT BLUE DYE RIGHT BREAST, LEFT BREAST PROPHYLACTIC MASTECTOMY;  Surgeon: Fanny Skates, MD;  Location: [place];  Service: General;  Laterality: Bilateral;  . NASAL SEPTUM SURGERY  08  . PILONIDAL CYST EXCISION    . THYROIDECTOMY, PARTIAL  mid 80's   right side    Social History:      Social History  Substance Use Topics  . Smoking status: Former Smoker    Packs/day: 0.50    Years: 42.00    Types: Cigarettes    Quit date: 10/30/2015  . Smokeless tobacco: Never Used  . Alcohol use No   Please also refer to relevant sections of EMR.  Family History:       Family History  Problem Relation Age of Onset  . Cancer Mother 64    liver cancer, hep c  . Heart disease Mother     chf  . Hypertension Mother   . Hyperlipidemia Mother   . Osteoporosis Mother   . Cancer Father 46    lung; smoker  . COPD Father   . Heart disease Sister     mvp  . Breast cancer Sister     dx. 51s  . Non-Hodgkin's lymphoma Sister 86    large B cell  . Hypertension Brother   . Arthritis Brother   . Kidney disease Brother   . Prostate cancer Brother     dx. early 29s  . Other Daughter     Beal's Syndrome  . Arthritis Daughter     Beal's  . Arthritis Son     Beal's connective tissue disease  . Vision loss Maternal Grandmother   . Dementia Maternal Grandmother   . Coronary artery disease Maternal Grandmother   . Heart Problems Maternal Grandmother   . Vision loss Paternal Grandfather   . Hypertension Brother   . Benign prostatic hyperplasia Brother   . Leukemia Brother     chronic lymphatic leukemia - small/B cell  . Hypertension Brother   . COPD Brother   . Prostate cancer Brother     dx. mid-60s  . Myelodysplastic syndrome Brother     dx. early 12s  . Hypertension Brother   . Hyperlipidemia Brother   . Prostate cancer Brother     dx. mid-70s  .  Melanoma  Brother     (x2) melanomas dx. late 60s-early 43s  . Mental illness Brother     schizophrenia  . Breast cancer Brother     dx. 77s  . Cirrhosis Brother     hep c  . Hypertension Sister   . Other Sister     thalessemia, anemia; hx of hysterectomy in her 40s  . Hyperlipidemia Sister   . Gout Sister   . Diverticulitis Sister   . Other Daughter     overweight  . Breast cancer Maternal Aunt 9  . Lung cancer Cousin     maternal 1st cousin dx. 73 or younger; former smoker  . Colon cancer Cousin     maternal 1st cousin dx. late 11s-early 60s  . Cancer Cousin     maternal 1st cousin d. NOS cancer  . Emphysema Paternal Aunt   . Lung cancer Paternal Aunt     d. early 73s; smoker  . Leukemia Cousin     paternal 1st cousin d. early 55s  . Colon cancer Other 42    niece  . Melanoma Other     niece    Allergies and Medications:      Allergies  Allergen Reactions  . Chantix [Varenicline] Other (See Comments)    "Felt like having mental breakdown"  . Ciprofloxacin Other (See Comments)    Muscle tightness, tendon aching and pain  . Penicillins Anaphylaxis and Swelling  . Tamoxifen Other (See Comments)    Blood clots  . Levaquin [Levofloxacin] Other (See Comments)    Severe tendon pain  . Lyrica [Pregabalin] Swelling  . Neurontin [Gabapentin] Swelling  . Morphine And Related Other (See Comments)    MORPHINE DRIP - causes pt to feel irritable and itch  . Ceclor [Cefaclor] Rash  . Other Other (See Comments)    Redness from tape and bandaides  . Sulfa Antibiotics Rash   No current facility-administered medications on file prior to encounter.          Current Outpatient Prescriptions on File Prior to Encounter  Medication Sig Dispense Refill  . cholecalciferol (VITAMIN D) 1000 UNITS tablet Take 1,000 Units by mouth daily.    . cyclobenzaprine (FLEXERIL) 10 MG tablet Take 1 tablet (10 mg total) by mouth 3 (three)  times daily as needed for muscle spasms. 60 tablet 5  . DULoxetine (CYMBALTA) 30 MG capsule Take 3 capsules (90 mg total) by mouth daily. 270 capsule 1  . esomeprazole (NEXIUM) 20 MG capsule Take 20 mg by mouth daily at 12 noon.    . fenofibrate 160 MG tablet Take 1 tablet (160 mg total) by mouth daily. 90 tablet 3  . hydrochlorothiazide (HYDRODIURIL) 25 MG tablet TAKE 1 TABLET (25 MG TOTAL) BY MOUTH DAILY. 30 tablet 5  . ibuprofen (ADVIL,MOTRIN) 200 MG tablet Take 800 mg by mouth at bedtime.    Marland Kitchen levothyroxine (SYNTHROID, LEVOTHROID) 25 MCG tablet Take 1 tablet (25 mcg total) by mouth daily. 90 tablet 1  . metFORMIN (GLUCOPHAGE) 500 MG tablet Take 1 tablet (500 mg total) by mouth 2 (two) times daily with a meal. 180 tablet 1  . metoprolol (LOPRESSOR) 50 MG tablet TAKE 1 TABLET (50 MG TOTAL) BY MOUTH 2 (TWO) TIMES DAILY. 180 tablet 3  . Multiple Vitamins-Minerals (MULTIVITAMIN WITH MINERALS) tablet Take 1 tablet by mouth daily.    . temazepam (RESTORIL) 7.5 MG capsule Take 1 capsule (7.5 mg total) by mouth at bedtime as needed for sleep. 30 capsule  5  . albuterol (PROVENTIL HFA;VENTOLIN HFA) 108 (90 BASE) MCG/ACT inhaler Inhale 2 puffs into the lungs every 4 (four) hours as needed for wheezing or shortness of breath (cough, shortness of breath or wheezing.). 1 Inhaler 1  . Blood Pressure Monitoring (ADULT BLOOD PRESSURE CUFF LG) KIT Use as directed (Patient not taking: Reported on 12/09/2015) 1 each 0  . levofloxacin (LEVAQUIN) 500 MG tablet Take 1 tablet (500 mg total) by mouth daily. (Patient not taking: Reported on 12/09/2015) 10 tablet 0    Objective: BP 134/62 (BP Location: Left Arm)   Pulse 78   Temp 98.7 F (37.1 C) (Oral)   Resp 19   SpO2 97%  Exam: General: Pleasant female, in NAD, obese Eyes: PERRLA, non icteric sclera ENTM: no nasal discharge, moist mucous membranes Neck: no lymphadenopathy Cardiovascular: regular rate and rhythm, no murmurs, no edema Respiratory: normal  work of breathing, clear to auscultation bilaterally Abdomen: soft, non distended, non tender, normal bowel sounds MSK: normal muscle mass and bulk. No pain upon palpation of RUE or LUE. No pain or tenderness in bilateral lower extremities.                       -Shoulder exam: Left;  no swelling, tenderness, instability; ligaments intact, reduced range of motion and pain upon left arm abduction (limited to 60 degrees). Right shoulder exam is normal. Skin: No rash. Scar on R knee. 2 healing drain scars on right, 1 healing drain scar on left. Bilateral horizontal mastectomy scars, well healed. Left chest erythema with painful golfball sized fluctuant mass on lateral aspect of left scar. (see picture) Neuro: Alert and oriented x 3, CN 2-12 intact, grip strength 5/5 bilaterally, 5/5 plantar and dorsiflexion, 5/5 strength in bilateral hips. Sensation intact throughout Psych: Normal mood and affect    Labs and Imaging: CBC BMET   Last Labs    Recent Labs Lab 12/03/15 1114  WBC 5.9  HGB 11.9*  HCT 34.1*     Last Labs   No results for input(s): NA, K, CL, CO2, BUN, CREATININE, GLUCOSE, CALCIUM in the last 168 hours.      Carlyle Dolly, MD 12/09/2015, 5:02 PM PGY-2, [place] [facility] pager: [phone], text pages welcome

## 2015-12-10 NOTE — Progress Notes (Signed)
Multiloculated subcutaneous fluid collection at left mastectomy site.  History of drain site Klebsiella infection..  The wound externally looks fine now.  There is no redness.  It is moderately tender to palpation.  She is on IV Rocephin, and had been on PO Levoquin.  The levoquin was stopped because of muscular aching.  I would not attempt to aspirate this fluid collection since it is multiloculated, and if it persists as a recurrent infection, may n eed to be completely opened up by the primary surgeon for drainage, debridement and packing.  I certainly would not entertain that currently.  Will pass this information along to Dr. Dalbert Batman.  Kathryne Eriksson. Dahlia Bailiff, MD, [place] [phone] [facility] Surgery

## 2015-12-10 NOTE — Progress Notes (Signed)
Patient is able to place herself on CPAP when she is ready.  RT will continue to monitor

## 2015-12-10 NOTE — Progress Notes (Signed)
*  Preliminary Results* Left upper extremity venous duplex completed. Left upper extremity is negative for deep and superficial vein thrombosis.  12/10/2015 9:00 AM  Maudry Mayhew, B.S., RVT, RDCS, RDMS

## 2015-12-11 DIAGNOSIS — M25512 Pain in left shoulder: Secondary | ICD-10-CM | POA: Diagnosis not present

## 2015-12-11 DIAGNOSIS — N6451 Induration of breast: Secondary | ICD-10-CM

## 2015-12-11 LAB — BASIC METABOLIC PANEL
ANION GAP: 8 (ref 5–15)
BUN: 13 mg/dL (ref 6–20)
CALCIUM: 9.4 mg/dL (ref 8.9–10.3)
CO2: 29 mmol/L (ref 22–32)
Chloride: 100 mmol/L — ABNORMAL LOW (ref 101–111)
Creatinine, Ser: 0.72 mg/dL (ref 0.44–1.00)
Glucose, Bld: 130 mg/dL — ABNORMAL HIGH (ref 65–99)
Potassium: 4 mmol/L (ref 3.5–5.1)
Sodium: 137 mmol/L (ref 135–145)

## 2015-12-11 LAB — CBC
HEMATOCRIT: 36.3 % (ref 36.0–46.0)
Hemoglobin: 12.1 g/dL (ref 12.0–15.0)
MCH: 28.1 pg (ref 26.0–34.0)
MCHC: 33.3 g/dL (ref 30.0–36.0)
MCV: 84.4 fL (ref 78.0–100.0)
Platelets: 325 10*3/uL (ref 150–400)
RBC: 4.3 MIL/uL (ref 3.87–5.11)
RDW: 12.6 % (ref 11.5–15.5)
WBC: 5.7 10*3/uL (ref 4.0–10.5)

## 2015-12-11 LAB — GLUCOSE, CAPILLARY
GLUCOSE-CAPILLARY: 103 mg/dL — AB (ref 65–99)
Glucose-Capillary: 131 mg/dL — ABNORMAL HIGH (ref 65–99)

## 2015-12-11 MED ORDER — CEFDINIR 300 MG PO CAPS: 300.0000 mg | ORAL_CAPSULE | Freq: Two times a day (BID) | ORAL | 0 refills | Status: DC

## 2015-12-11 MED ORDER — HYDROCODONE-ACETAMINOPHEN 5-325 MG PO TABS: 1.0000 | ORAL_TABLET | Freq: Four times a day (QID) | ORAL | 0 refills | Status: DC | PRN

## 2015-12-11 NOTE — Progress Notes (Signed)
Patient ID: Susan [name], female   DOB: 1951-01-21, 65 y.o.   MRN: [id number] Pt presented for nurse only visit for Rocephin 1g IM inj dose 2 of 3. RTC for recheck with Dr. Brigitte Pulse in 24 hrs.

## 2015-12-11 NOTE — Progress Notes (Signed)
Patient ID: Susan [name], female   DOB: [birth year], 65 y.o.   MRN: [id number] I think fine to go home today on oral abx. This is not amenable to aspiration.  I think this needs to be opened in or and let heal.  I will call her tomorrow and schedule for later this week.

## 2015-12-11 NOTE — Discharge Summary (Signed)
[facility] Discharge Summary  Patient name: Susan [name] Medical record number: [id number] Date of birth: [birth year] Age: 65 y.o. Gender: female Date of Admission: 12/09/2015  Date of Discharge: 12/11/15 Admitting Physician: Zenia Resides, MD  Primary Care Provider: [name], MD Consultants: surgery   Indication for Hospitalization:   Discharge Diagnoses/Problem List:  BL mastectomy June 2017 (history of bilateral breast cancer) Fibromyalgia T2DM OSA OA HTN HLD Fatty Liver Disease COPD Anxiety  Hypothyroid GERD DVT  Disposition: home  Discharge Condition: stable, improved   Discharge Exam:  Please refer to exam from day of discharge  Brief Hospital Course:  [place] a 65 y.o.femalewho is s/p B/L mastectomy in June 2017 who presented with persistent swelling and pain at Left mastectomy site and L arm pain.   Briefly, patient had bilateral mastectomy on 6/21 due to breast cancer returning in her right breast (was initially in the left breast and patient took Tamoxifen). She was seen by her surgeon Dr. Dalbert Batman and noted to have cellulitis on her R chest and was given Doxycycline. Doxycycline was continued for 22 days due to persistent fever and unresolved cellulitis. Patient noted that the right side of the chest seemed to get a little better but then the left side began draining pus from her drain. She then went to her PCP and a wound culture was obtained from fluid in her drainage bulb. The wound culture grew Serratia that was resistant to Doxy. Doxy was stopped and CTX (3 day course) was started on 7/29; she was transitioned to PO Levaquin. She continued to have pain and swelling near her left chest and then developed pain in her left arm which radiatedto her shoulder on 12/07/15. The pain came on suddenly and she describes it as sharp. Denies any trauma.She hadlimited range of motion in her left armdue to this. She presented to  her PCP on day of admission due to this and she was directly admitted to [facility] for IV abx and further work up.  Left Breast Swelling, likely consistent with seroma:   Patient was afebrile and non-septic appearing with stable vitals. No leukocytosis on labs. Soft tissue US of the area showed a small collection of fluid, however since patient was afebrile with normal wbc, it was thought to be more consistent with seroma over an abscess. Additionally, there was no significant erythema or increased warmth over the area. Patient was started on CTX (has allergy to PCN but tolerated this in the past).  Surgery was consulted and reported no indication for drainage. Patient was transitioned to PO Omnicef to complete a total antibiotic course of 7 days to cover for a possible cellulitis although this was less likely. Patient is to follow up with her surgeon regarding seroma.   Left Arm Pain, Tendinopathy vs Inflammation of Subachromial Bursa: [place] of left upper extremity was completed and ruled out DVT. Patient's symptoms improved without intervention other than Tylenol PRN. PT evaluated patient and recommended no further PT requirement.    Issues for Follow Up:  - ensure patient has follow up with surgeon - ensure completion of 7 day course of antibiotic  Significant Procedures: none  Significant Labs and Imaging:   Recent Labs Lab 12/09/15 1839 12/10/15 0545 12/11/15 0903  WBC 5.8 4.9 5.7  HGB 11.2* 11.1* 12.1  HCT 33.6* 33.3* 36.3  PLT 298 285 325    Recent Labs Lab 12/09/15 1839 12/10/15 0545 12/11/15 0903  NA 136 139 137  K  3.5 4.4 4.0  CL 100* 102 100*  CO2 '26 27 29  ' GLUCOSE 93 114* 130*  BUN '14 13 13  ' CREATININE 0.91 0.85 0.72  CALCIUM 9.3 9.4 9.4  ALKPHOS 52  --   --   AST 25  --   --   ALT 29  --   --   ALBUMIN 3.7  --   --     Results/Tests Pending at Time of Discharge: none  Discharge Medications:    Medication List    STOP taking these medications   Adult Blood  Pressure Cuff Lg Kit   levofloxacin 500 MG tablet Commonly known as:  LEVAQUIN     TAKE these medications   albuterol 108 (90 Base) MCG/ACT inhaler Commonly known as:  PROVENTIL HFA;VENTOLIN HFA Inhale 2 puffs into the lungs every 4 (four) hours as needed for wheezing or shortness of breath (cough, shortness of breath or wheezing.).   cefdinir 300 MG capsule Commonly known as:  OMNICEF Take 1 capsule (300 mg total) by mouth 2 (two) times daily.   cholecalciferol 1000 units tablet Commonly known as:  VITAMIN D Take 1,000 Units by mouth daily.   cyclobenzaprine 10 MG tablet Commonly known as:  FLEXERIL Take 1 tablet (10 mg total) by mouth 3 (three) times daily as needed for muscle spasms.   DULoxetine 30 MG capsule Commonly known as:  CYMBALTA Take 3 capsules (90 mg total) by mouth daily.   esomeprazole 20 MG capsule Commonly known as:  NEXIUM Take 20 mg by mouth daily at 12 noon.   fenofibrate 160 MG tablet Take 1 tablet (160 mg total) by mouth daily.   hydrochlorothiazide 25 MG tablet Commonly known as:  HYDRODIURIL TAKE 1 TABLET (25 MG TOTAL) BY MOUTH DAILY.   HYDROcodone-acetaminophen 5-325 MG tablet Commonly known as:  NORCO/VICODIN Take 1 tablet by mouth every 6 (six) hours as needed for severe pain.   ibuprofen 200 MG tablet Commonly known as:  ADVIL,MOTRIN Take 800 mg by mouth at bedtime.   levothyroxine 25 MCG tablet Commonly known as:  SYNTHROID, LEVOTHROID Take 1 tablet (25 mcg total) by mouth daily.   metFORMIN 500 MG tablet Commonly known as:  GLUCOPHAGE Take 1 tablet (500 mg total) by mouth 2 (two) times daily with a meal.   metoprolol 50 MG tablet Commonly known as:  LOPRESSOR TAKE 1 TABLET (50 MG TOTAL) BY MOUTH 2 (TWO) TIMES DAILY.   multivitamin with minerals tablet Take 1 tablet by mouth daily.   temazepam 7.5 MG capsule Commonly known as:  RESTORIL Take 1 capsule (7.5 mg total) by mouth at bedtime as needed for sleep.   VITAMIN C  PO Take 1 tablet by mouth 3 (three) times daily.       Discharge Instructions: Please refer to Patient Instructions section of EMR for full details.  Patient was counseled important signs and symptoms that should prompt return to medical care, changes in medications, dietary instructions, activity restrictions, and follow up appointments.   Follow-Up Appointments: Follow-up Information    SHAW,EVA, MD .   Specialty:  Family Medicine Why:  Please make a hospital follow up appointment for next week  Contact information: [place] [address] [phone]           Smiley Houseman, MD 12/12/2015, 11:23 AM PGY-2, [place]

## 2015-12-11 NOTE — Discharge Instructions (Signed)
You were in the hospital due to swelling and pain and the Left mastectomy site. Surgery saw you and did not think that and Incision and Drainage is needed while you are in the hospital. We started you on Ceftriaxone for possible infection of the site. You will  Be discharged with Omnicef (antibiotic) that you will take for 5 days starting 8/7 to complete a 7 day course of antibiotics. Please follow up with your surgeon as soon as possible.

## 2015-12-11 NOTE — Patient Instructions (Signed)
ROM: Flexion    Keeping left arm on table, slide arm on table keeping thumb up until a gentle stretch is felt. Hold 10 seconds. Repeat __5__ times per set. Do __1__ sets per session. Do _3/5_ sessions per day.  http://orth.exer.us/756   Copyright  VHI. All rights reserved.  ROM: Flexion   ROM: Flexion (Alternate)    Slide right arm up wall, with palm out, by leaning toward wall. Hold ____ seconds. Repeat ____ times per set. Do ____ sets per session. Do ____ sessions per day.  http://orth.exer.us/758   Copyright  VHI. All rights reserved.

## 2015-12-11 NOTE — Progress Notes (Signed)
Family Medicine Teaching Service Daily Progress Note Intern Pager: [phone]  Patient name: Susan [name] Medical record number: [id number] Date of birth: 1950/08/03 Age: 65 y.o. Gender: female  Primary Care Provider: [name], MD Consultants: None Code Status: Full (per discussion on admission)  Chief Complaint: cellulitis, L arm pain  Assessment and Plan: Susan [name] is a 65 y.o. female who is s/p B/L mastectomy in June 2017 presenting with persistent cellulitis and L arm pain . PMH is significant for Bilateral breast cancer, Fibromyalgia, T2DM, Sleep Apnea, OA, HTN, HLD, Fatty liver dz, COPD, anxiety, hypothyroidism, GERD, DVT.  Cellulitis and possible abscess vs seroma on L chest. + Serratia on wound cx : S/p bilateral masectomy on 6/21 as below. Initially had cellulitis on R mastectomy site, was given Doxy x 22 days for persistent cellulitis, then cellulitis developed on L mastectomy site. Wound cx from drainage bulb grew doxy resistant Serratia. Was then given CTX x 3 days and Levaquin x 6 days but continued to have pain and then developed L arm pain (see below assessment). Likely failed outpatient antibiotic therapy vs insufficient course of antibiotics. Has been afebrile and non septic appearing. No leukocytosis. Soft tissue U/S of left chest confirmed abscess vs seroma. - Surgery: no indication for drainage  - IV CTX (listed in allergies but patient has taken this before without any reaction) > will likely transition to PO Omnicef for total course of 7 days; but unlikely this is a soft tissue infection  - patient to follow up with her surgeon   Left arm pain, improved: Seems MSK in nature on exam. R/o DVT vs tendonopathy from Levaquin vs muscle strain vs adhesive capsulitis vs rotator cuff syndrome. Could also be due to spinal process. Patient states she has hx of neck pain. U/S of LUE negative for DVT. X-ray shoulder unremarkable - Tylenol PRN  Bilateral Breast Cancer, s/p  elective B/L mastectomy on 6/21: Underwent left lumpectomy and adjuvant radiation therapy for ductal carcinoma in situ left breast in 2004. Took tamoxifen for 3 weeks and developed DVT so that was discontinued. She did well thereafter but was found to have intermediate to high-grade ductal carcinoma in situ of the right breast in April. She elected to have bilateral mastectomy. Pathology report: left breast had some atypical hyperplasia but no cancer, right breast had residual ductal carcinoma in situ, but all the lymph nodes were negative.   HTN: Currently normotensive  - Home HCTZ, Metoprolol   HLD - Hold home fenofibrate  GERD - Protonix  T2DM: Now pre-diabetes according to A1C of 6 on 10/18/15 - Sensitive SSI - Hold home Metformin  Fibromyalgia:  -Home Flexeril and Cymbalta  COPD - Home Albuterol PRN  Mediastinal lymphadenopathy: Found on CT scan in 2016 - Will need repeat scan in outpatient setting.   Sleep Apnea - CPAP qhs  Anxiety - Home Restoril   Hypothyroidism - Home Synthroid  FEN/GI:  Regular diet/ saline lock Prophylaxis: Lovenox  Disposition: Continue to monitor on med surg floor  Subjective:   No acute events overnight. Doing well. Reports possible increase in swelling of the area.    Objective: BP (!) 113/53 (BP Location: Left Arm)   Pulse 63   Temp 97.3 F (36.3 C) (Oral)   Resp 18   Ht 5\' 4"  (1.626 m)   Wt 104.6 kg (230 lb 9.6 oz)   SpO2 99%   BMI 39.58 kg/m  Exam: General: Pleasant female, in NAD, obese Cardiovascular: regular rate and rhythm,  no murmurs, no edema Respiratory: normal work of breathing, clear to auscultation bilaterally MSK: normal muscle mass and bulk. No pain upon palpation of RUE or LUE. No pain or tenderness in bilateral lower extremities.  -Shoulder exam: Left;  no swelling, tenderness, instability; ligaments intact, improved ROM- essentially normal active ROM. Right shoulder exam is normal. Some mild tenderness of left  upper border of trapezius.  Skin: No rash. Scar on R knee. 2 healing drain scars on right, 1 healing drain scar on left. Bilateral horizontal mastectomy scars, well healed. Left chest erythema and swelling improved, fluctuant mass has decreased in size on lateral aspect of left scar.  Neuro: Alert and oriented x 3 Psych: Normal mood and affect    Labs and Imaging: CBC BMET   Recent Labs Lab 12/11/15 0903  WBC 5.7  HGB 12.1  HCT 36.3  PLT 325    Recent Labs Lab 12/11/15 0903  NA 137  K 4.0  CL 100*  CO2 29  BUN 13  CREATININE 0.72  GLUCOSE 130*  CALCIUM 9.4     Dg Shoulder Left  Result Date: 12/10/2015 CLINICAL DATA:  Patient with cellulitis and left arm pain. EXAM: LEFT SHOULDER - 2+ VIEW COMPARISON:  None. FINDINGS: Normal anatomic alignment. No evidence for acute fracture or dislocation. Visualized left hemi thorax unremarkable. Left axillary surgical clips. IMPRESSION: No acute osseous abnormality. Electronically Signed   By: Lovey Newcomer M.D.   On: 12/10/2015 15:29    Smiley Houseman, MD 12/11/2015, 10:02 AM PGY-2, [facility] Intern pager: [phone], text pages welcome

## 2015-12-11 NOTE — Evaluation (Signed)
Physical Therapy Evaluation Patient Details Name: WANDA OBERLANDER MRN: [id number] DOB: 14-Jul-1950 Today's Date: 12/11/2015   History of Present Illness  65 y.o. female with complaints of insidious onset of Lt shoulder pain starting on 12/08/15. PMH: breast cancer s/p bilateral mastecotmies, PVD, neuropathy, HTN, fibromyalgia, DM, DDD, COPD.   Clinical Impression  Pt presenting with Lt shoulder pain and decreased ROM. Special testing positive for impingement and negative with rotator cuff testing. Pt started/educated on HEP for gently ROM to Lt shoulder. Initially pt active flexion was approximately 90 degrees and improved to approx. 140 degrees by end of session. Pt provided with handout for reference regarding HEP and she denied any questions or concerns. Will d/c from PT services at this time.      Follow Up Recommendations No PT follow up    Equipment Recommendations  None recommended by PT    Recommendations for Other Services       Precautions / Restrictions Precautions Precautions: None Restrictions Weight Bearing Restrictions: No      Mobility  Bed Mobility               General bed mobility comments: NT  Transfers Overall transfer level: Independent Equipment used: None                Ambulation/Gait             General Gait Details: up in room independently  Stairs            Wheelchair Mobility    Modified Rankin (Stroke Patients Only)       Balance Overall balance assessment: No apparent balance deficits (not formally assessed)                                           Pertinent Vitals/Pain Pain Assessment: Faces Faces Pain Scale: Hurts a little bit Pain Location: Lt shoulder Pain Descriptors / Indicators: Aching Pain Intervention(s): Limited activity within patient's tolerance;Monitored during session    Home Living Family/patient expects to be discharged to:: Private residence Living Arrangements:  Spouse/significant other                    Prior Function Level of Independence: Independent               Hand Dominance        Extremity/Trunk Assessment   Upper Extremity Assessment: LUE deficits/detail       LUE Deficits / Details: Active flexion 90 degrees limited by pain, pain with IR, WNL ER. Tender with palpation along interscapular musculature and Lt AC jt. Intact sensation noted. ST: compression test, full can, hawkins-kennedy, cross over impingement (all negative), positive Neers Impingement. Strength Lt shoulder; flexion 4+/5, ER4+5, IR 4/5, Ext 5/5.    Lower Extremity Assessment: Overall WFL for tasks assessed         Communication   Communication: No difficulties  Cognition Arousal/Alertness: Awake/alert Behavior During Therapy: WFL for tasks assessed/performed Overall Cognitive Status: Within Functional Limits for tasks assessed                      General Comments General comments (skin integrity, edema, etc.): Active flexion approx. 140 degrees pain free by end of session. Pt denies questions or concerns. Pt provided with handout for reference with all exercies.  Pt denies any questions or concerns.  Exercises Other Exercises Other Exercises: Shoulder flexion - wall/table supported 1x5 10 sec. holds Other Exercises: shoulder internal rotation - 1x5 10 sec holds Other Exercises: acive scapular retraction rows 1X10      Assessment/Plan    PT Assessment Patent does not need any further PT services  PT Diagnosis Acute pain   PT Problem List    PT Treatment Interventions     PT Goals (Current goals can be found in the Care Plan section) Acute Rehab PT Goals Patient Stated Goal: move arm without pain PT Goal Formulation: With patient Time For Goal Achievement: 12/16/15 Potential to Achieve Goals: Good    Frequency     Barriers to discharge        Co-evaluation               End of Session   Activity Tolerance:  Patient tolerated treatment well Patient left: Other (comment) (up in room independently)           Time: [id number] PT Time Calculation (min) (ACUTE ONLY): 44 min   Charges:   PT Evaluation $PT Eval Moderate Complexity: 1 Procedure PT Treatments $Therapeutic Exercise: 8-22 mins   PT G Codes:        Cassell Clement, PT, CSCS Pager [phone] Office [phone]  12/11/2015, 12:48 PM

## 2015-12-12 ENCOUNTER — Other Ambulatory Visit: Payer: Self-pay | Admitting: General Surgery

## 2015-12-12 ENCOUNTER — Encounter (HOSPITAL_BASED_OUTPATIENT_CLINIC_OR_DEPARTMENT_OTHER): Payer: Self-pay | Admitting: *Deleted

## 2015-12-12 NOTE — Progress Notes (Signed)
Pt denies any recent chest pain or sob. Pt is diabetic and on Metformin. States fasting blood sugar range from 105-120. Last A1C was 6.0 on 10/18/15.

## 2015-12-12 NOTE — H&P (Signed)
Susan [name] Location: [facility] Patient #: [id number] DOB: 1950/06/08 Married / Language: English / Race: White Female   History of Present Illness  The patient is a 65 year old female who presents with breast cancer. This is a 65 year old Caucasian female who returns for a postop visit. On October 26, 2015 she underwent left prophylactic mastectomy and right total mastectomy and sentinel node biopsy. She had DCIS on the right with negative margins and negative nodes. ADH on the left. She's had some problems with cellulitis.  When I last saw her in the office on July 27 she was doing well cellulitis had resolved.  She was feeling better.  We remove the drains.     This past weekend she was admitted for 2 days for IV antibiotics.  Cellulitis had returned.  He improved rapidly but ultrasound showed a multiloculated fluid collection.  She was told by my partners that she needed to have this drained in the OR.  She was discharged from the hospital on Sunday and instructed to contact me.  We discussed this with her on Monday and she is scheduled for surgery on Tuesday, 12/13/2015.       Comorbidities include morbid obesity, non-insulin-dependent diabetes mellitus, COPD, former smoker recently quit, sleep apnea, GERD, hypertension, history of thyroidectomy taking Synthroid       She's going to see Dr. Lindi Adie on August 11. She was referred to physical therapy at our last office visit.  She is scheduled to see Dr. Lindi Adie on August 11..   Allergies  Chantix *PSYCHOTHERAPEUTIC AND NEUROLOGICAL AGENTS - MISC.* Ciprofloxacin *CHEMICALS* PenicillAMINE *ASSORTED CLASSES* Tamoxifen Citrate *ANTINEOPLASTICS AND ADJUNCTIVE THERAPIES* Lyrica *ANTICONVULSANTS* Sulfa Antibiotics Morphine Sulfate (Concentrate) *ANALGESICS - OPIOID* Neurontin *ANTICONVULSANTS*  Medication History  Doxycycline Hyclate (100MG  Tablet, 1 (one) Tablet Oral two times daily, Taken starting 11/18/2015)  Active. Temazepam (7.5MG  Capsule, Oral) Active. Cyclobenzaprine HCl (10MG  Tablet, Oral) Active. Fenofibrate (160MG  Tablet, Oral) Active. DULoxetine HCl (30MG  Capsule DR Part, Oral) Active. Levothyroxine Sodium (25MCG Tablet, Oral) Active. MetFORMIN HCl (500MG  Tablet, Oral) Active. Metoprolol Tartrate (50MG  Tablet, Oral) Active. Pataday (0.2% Solution, Ophthalmic) Active. ProAir HFA (108 (90 Base)MCG/ACT Aerosol Soln, Inhalation) Active. HydroCHLOROthiazide (25MG  Tablet, Oral) Active. Ibuprofen (200MG  Tablet, Oral) Active. Medications Reconciled  Vitals  12/01/2015 10:09 AM Weight: 236 lb Height: 64in Body Surface Area: 2.1 m Body Mass Index: 40.51 kg/m  Temp.: 98.53F(Temporal)  Pulse: 81 (Regular)  BP: 128/80 (Sitting, Left Arm, Standard)       Physical Exam  General Note: Alert. No distress. Upbeat attitude. Husband is with her.  Neck.  No adenopathy or mass.  Full range of motion Heart: Regular rate and rhythm.  No murmur.  No ectopy Lungs: Clear to auscultation bilaterally   Breast Note: Mastectomy wounds appear to be healing without skin necrosis.  Left breast swelling laterally.  Mild tenderness.  Cellulitis versus seroma.. Drains removed. Excellent range of motion both shoulders.      Assessment/plan:  Left mastectomy wound infection:    Plan exploration and drainage of left mastectomy wound in OR  CANCER OF CENTRAL PORTION OF RIGHT FEMALE BREAST (C50.111) HISTORY OF LEFT BREAST CANCER (Z85.3) BMI 40.0-44.9, ADULT (Z68.41) SLEEP APNEA IN ADULT (G47.30) TYPE 2 DIABETES MELLITUS TREATED WITHOUT INSULIN (E11.9) COPD, MILD (J44.9) LATENT TUBERCULOSIS BY BLOOD TEST (R76.11) HYPERTENSION, BENIGN (I10) INCISIONAL INFECTION (T81.4XXA) CHRONIC GERD (K21.9)    Granger Chui M. Dalbert Batman, M.D., [facility] General and Minimally invasive Surgery Breast and Colorectal Surgery Office:  [phone] Pager:    [phone]

## 2015-12-13 ENCOUNTER — Encounter (HOSPITAL_COMMUNITY): Payer: Self-pay | Admitting: *Deleted

## 2015-12-13 ENCOUNTER — Encounter (HOSPITAL_COMMUNITY)
Admission: RE | Disposition: A | Discharge status: Home / Self Care | Payer: Self-pay | Source: Ambulatory Visit | Attending: General Surgery

## 2015-12-13 ENCOUNTER — Ambulatory Visit (HOSPITAL_COMMUNITY): Payer: 59 | Admitting: Anesthesiology

## 2015-12-13 ENCOUNTER — Ambulatory Visit (HOSPITAL_BASED_OUTPATIENT_CLINIC_OR_DEPARTMENT_OTHER)
Admission: RE | Admit: 2015-12-13 | Discharge: 2015-12-13 | Disposition: A | Discharge status: Home / Self Care | Payer: 59 | Source: Ambulatory Visit | Attending: General Surgery | Admitting: General Surgery

## 2015-12-13 DIAGNOSIS — I10 Essential (primary) hypertension: Secondary | ICD-10-CM | POA: Insufficient documentation

## 2015-12-13 DIAGNOSIS — Z7984 Long term (current) use of oral hypoglycemic drugs: Secondary | ICD-10-CM | POA: Insufficient documentation

## 2015-12-13 DIAGNOSIS — Z79899 Other long term (current) drug therapy: Secondary | ICD-10-CM | POA: Diagnosis not present

## 2015-12-13 DIAGNOSIS — Z9013 Acquired absence of bilateral breasts and nipples: Secondary | ICD-10-CM | POA: Diagnosis not present

## 2015-12-13 DIAGNOSIS — K219 Gastro-esophageal reflux disease without esophagitis: Secondary | ICD-10-CM | POA: Diagnosis not present

## 2015-12-13 DIAGNOSIS — G4730 Sleep apnea, unspecified: Secondary | ICD-10-CM | POA: Diagnosis not present

## 2015-12-13 DIAGNOSIS — Z791 Long term (current) use of non-steroidal anti-inflammatories (NSAID): Secondary | ICD-10-CM | POA: Insufficient documentation

## 2015-12-13 DIAGNOSIS — L7634 Postprocedural seroma of skin and subcutaneous tissue following other procedure: Secondary | ICD-10-CM | POA: Diagnosis not present

## 2015-12-13 DIAGNOSIS — Y836 Removal of other organ (partial) (total) as the cause of abnormal reaction of the patient, or of later complication, without mention of misadventure at the time of the procedure: Secondary | ICD-10-CM | POA: Insufficient documentation

## 2015-12-13 DIAGNOSIS — J449 Chronic obstructive pulmonary disease, unspecified: Secondary | ICD-10-CM | POA: Diagnosis not present

## 2015-12-13 DIAGNOSIS — L0291 Cutaneous abscess, unspecified: Secondary | ICD-10-CM | POA: Diagnosis present

## 2015-12-13 DIAGNOSIS — E119 Type 2 diabetes mellitus without complications: Secondary | ICD-10-CM | POA: Insufficient documentation

## 2015-12-13 DIAGNOSIS — Z87891 Personal history of nicotine dependence: Secondary | ICD-10-CM | POA: Diagnosis not present

## 2015-12-13 DIAGNOSIS — T814XXA Infection following a procedure, initial encounter: Secondary | ICD-10-CM | POA: Diagnosis present

## 2015-12-13 DIAGNOSIS — Z6839 Body mass index (BMI) 39.0-39.9, adult: Secondary | ICD-10-CM | POA: Diagnosis not present

## 2015-12-13 HISTORY — PX: INCISION AND DRAINAGE ABSCESS: SHX5864

## 2015-12-13 LAB — GLUCOSE, CAPILLARY
GLUCOSE-CAPILLARY: 108 mg/dL — AB (ref 65–99)
GLUCOSE-CAPILLARY: 98 mg/dL (ref 65–99)
Glucose-Capillary: 99 mg/dL (ref 65–99)

## 2015-12-13 SURGERY — INCISION AND DRAINAGE, ABSCESS
Anesthesia: General | Site: Breast | Laterality: Left

## 2015-12-13 MED ORDER — LACTATED RINGERS IV SOLN
INTRAVENOUS | Status: DC | PRN
  Administered 2015-12-13: 13:00:00 via INTRAVENOUS

## 2015-12-13 MED ORDER — VANCOMYCIN HCL 10 G IV SOLR
1500.0000 mg | INTRAVENOUS | Status: AC
  Administered 2015-12-13: 1500 mg via INTRAVENOUS
  Filled 2015-12-13: qty 1500

## 2015-12-13 MED ORDER — LACTATED RINGERS IV SOLN: INTRAVENOUS | Status: DC

## 2015-12-13 MED ORDER — FENTANYL CITRATE (PF) 100 MCG/2ML IJ SOLN
INTRAMUSCULAR | Status: DC | PRN
Start: 2015-12-13 — End: 2015-12-13
  Administered 2015-12-13: 100 ug via INTRAVENOUS

## 2015-12-13 MED ORDER — HYDROCODONE-ACETAMINOPHEN 5-325 MG PO TABS: 1.0000 | ORAL_TABLET | Freq: Four times a day (QID) | ORAL | 0 refills | Status: DC | PRN

## 2015-12-13 MED ORDER — MIDAZOLAM HCL 2 MG/2ML IJ SOLN
INTRAMUSCULAR | Status: AC
  Filled 2015-12-13: qty 2

## 2015-12-13 MED ORDER — SUCCINYLCHOLINE CHLORIDE 200 MG/10ML IV SOSY
PREFILLED_SYRINGE | INTRAVENOUS | Status: AC
  Filled 2015-12-13: qty 10

## 2015-12-13 MED ORDER — PROPOFOL 10 MG/ML IV BOLUS
INTRAVENOUS | Status: AC
  Filled 2015-12-13: qty 20

## 2015-12-13 MED ORDER — FENTANYL CITRATE (PF) 100 MCG/2ML IJ SOLN: 25.0000 ug | INTRAMUSCULAR | Status: DC | PRN

## 2015-12-13 MED ORDER — MIDAZOLAM HCL 5 MG/5ML IJ SOLN
INTRAMUSCULAR | Status: DC | PRN
  Administered 2015-12-13 (×2): 1 mg via INTRAVENOUS

## 2015-12-13 MED ORDER — LIDOCAINE 2% (20 MG/ML) 5 ML SYRINGE
INTRAMUSCULAR | Status: AC
  Filled 2015-12-13: qty 5

## 2015-12-13 MED ORDER — LACTATED RINGERS IV SOLN
INTRAVENOUS | Status: DC
  Administered 2015-12-13: 11:00:00 via INTRAVENOUS

## 2015-12-13 MED ORDER — VANCOMYCIN HCL IN DEXTROSE 1-5 GM/200ML-% IV SOLN
INTRAVENOUS | Status: AC
  Filled 2015-12-13: qty 200

## 2015-12-13 MED ORDER — 0.9 % SODIUM CHLORIDE (POUR BTL) OPTIME
TOPICAL | Status: DC | PRN
  Administered 2015-12-13: 1000 mL

## 2015-12-13 MED ORDER — ONDANSETRON HCL 4 MG/2ML IJ SOLN
INTRAMUSCULAR | Status: DC | PRN
  Administered 2015-12-13: 4 mg via INTRAVENOUS

## 2015-12-13 MED ORDER — PROPOFOL 10 MG/ML IV BOLUS
INTRAVENOUS | Status: DC | PRN
  Administered 2015-12-13: 150 mg via INTRAVENOUS

## 2015-12-13 MED ORDER — FENTANYL CITRATE (PF) 250 MCG/5ML IJ SOLN
INTRAMUSCULAR | Status: AC
  Filled 2015-12-13: qty 5

## 2015-12-13 MED ORDER — ONDANSETRON HCL 4 MG/2ML IJ SOLN
INTRAMUSCULAR | Status: AC
  Filled 2015-12-13: qty 2

## 2015-12-13 SURGICAL SUPPLY — 32 items
BLADE SURG ROTATE 9660 (MISCELLANEOUS) IMPLANT
BNDG GAUZE ELAST 4 BULKY (GAUZE/BANDAGES/DRESSINGS) ×1 IMPLANT
CANISTER SUCTION 2500CC (MISCELLANEOUS) ×2 IMPLANT
CHLORAPREP W/TINT 26ML (MISCELLANEOUS) IMPLANT
COVER SURGICAL LIGHT HANDLE (MISCELLANEOUS) ×2 IMPLANT
DRAPE LAPAROSCOPIC ABDOMINAL (DRAPES) ×2 IMPLANT
DRAPE UTILITY XL STRL (DRAPES) ×3 IMPLANT
ELECT CAUTERY BLADE 6.4 (BLADE) ×2 IMPLANT
ELECT REM PT RETURN 9FT ADLT (ELECTROSURGICAL) ×2
ELECTRODE REM PT RTRN 9FT ADLT (ELECTROSURGICAL) ×1 IMPLANT
GAUZE PACKING FOLDED 1IN STRL (GAUZE/BANDAGES/DRESSINGS) ×1 IMPLANT
GAUZE SPONGE 4X4 12PLY STRL (GAUZE/BANDAGES/DRESSINGS) ×2 IMPLANT
GLOVE BIOGEL PI IND STRL 6.5 (GLOVE) IMPLANT
GLOVE BIOGEL PI INDICATOR 6.5 (GLOVE) ×1
GLOVE ECLIPSE 6.5 STRL STRAW (GLOVE) ×1 IMPLANT
GLOVE EUDERMIC 7 POWDERFREE (GLOVE) ×2 IMPLANT
GOWN STRL REUS W/ TWL LRG LVL3 (GOWN DISPOSABLE) ×1 IMPLANT
GOWN STRL REUS W/ TWL XL LVL3 (GOWN DISPOSABLE) ×1 IMPLANT
GOWN STRL REUS W/TWL LRG LVL3 (GOWN DISPOSABLE) ×2
GOWN STRL REUS W/TWL XL LVL3 (GOWN DISPOSABLE) ×2
KIT BASIN OR (CUSTOM PROCEDURE TRAY) ×2 IMPLANT
KIT ROOM TURNOVER OR (KITS) ×2 IMPLANT
NS IRRIG 1000ML POUR BTL (IV SOLUTION) ×2 IMPLANT
PACK GENERAL/GYN (CUSTOM PROCEDURE TRAY) ×2 IMPLANT
PAD ABD 8X10 STRL (GAUZE/BANDAGES/DRESSINGS) ×1 IMPLANT
PAD ARMBOARD 7.5X6 YLW CONV (MISCELLANEOUS) ×4 IMPLANT
SWAB COLLECTION DEVICE MRSA (MISCELLANEOUS) ×2 IMPLANT
TAPE CLOTH SURG 4X10 WHT LF (GAUZE/BANDAGES/DRESSINGS) ×1 IMPLANT
TOWEL OR 17X24 6PK STRL BLUE (TOWEL DISPOSABLE) ×2 IMPLANT
TOWEL OR 17X26 10 PK STRL BLUE (TOWEL DISPOSABLE) ×2 IMPLANT
TUBE ANAEROBIC SPECIMEN COL (MISCELLANEOUS) ×2 IMPLANT
WATER STERILE IRR 1000ML POUR (IV SOLUTION) ×2 IMPLANT

## 2015-12-13 NOTE — Discharge Instructions (Signed)
Ice pack on the bandage for 10 minutes at a time, 3 times an hour  Change the outer bandages if they get wet or soiled  Leave the packing in place

## 2015-12-13 NOTE — Interval H&P Note (Signed)
History and Physical Interval Note:  12/13/2015 11:40 AM  Susan [name]  has presented today for surgery, with the diagnosis of LEFT MASTECTOMY WOUND   The various methods of treatment have been discussed with the patient and family. After consideration of risks, benefits and other options for treatment, the patient has consented to  Procedure(s): DRAINAGE LEFT MASTECTOMY WOUND INFECTION (Left) as a surgical intervention .  The patient's history has been reviewed, patient examined, no change in status, stable for surgery.  I have reviewed the patient's chart and labs.  Questions were answered to the patient's satisfaction.     Adin Hector

## 2015-12-13 NOTE — Anesthesia Postprocedure Evaluation (Signed)
Anesthesia Post Note  Patient: Susan [name]  Procedure(s) Performed: Procedure(s) (LRB): DRAINAGE LEFT MASTECTOMY WOUND INFECTION (Left)  Patient location during evaluation: PACU Anesthesia Type: General Level of consciousness: awake and alert Pain management: pain level controlled Vital Signs Assessment: post-procedure vital signs reviewed and stable Respiratory status: spontaneous breathing, nonlabored ventilation, respiratory function stable and patient connected to nasal cannula oxygen Cardiovascular status: blood pressure returned to baseline and stable Postop Assessment: no signs of nausea or vomiting Anesthetic complications: no    Last Vitals:  Vitals:   12/13/15 1500 12/13/15 1501  BP: (!) 142/74 (!) 146/77  Pulse: 82 83  Resp: 20 (!) 22  Temp:      Last Pain:  Vitals:   12/13/15 1500  TempSrc:   PainSc: 0-No pain                 Vasilia Dise L

## 2015-12-13 NOTE — Transfer of Care (Signed)
Immediate Anesthesia Transfer of Care Note  Patient: Susan [name]  Procedure(s) Performed: Procedure(s): DRAINAGE LEFT MASTECTOMY WOUND INFECTION (Left)  Patient Location: PACU  Anesthesia Type:General  Level of Consciousness: awake, alert , oriented and patient cooperative  Airway & Oxygen Therapy: Patient Spontanous Breathing and Patient connected to nasal cannula oxygen  Post-op Assessment: Report given to RN, Post -op Vital signs reviewed and stable, Patient moving all extremities and Patient moving all extremities X 4  Post vital signs: Reviewed and stable  Last Vitals:  Vitals:   12/13/15 1103 12/13/15 1438  BP: (!) 153/68   Pulse: 70   Resp: 18 (P) 19  Temp: 36.9 C (P) 36.9 C    Last Pain:  Vitals:   12/13/15 1118  TempSrc:   PainSc: 7       Patients Stated Pain Goal: 3 ([id number])  Complications: No apparent anesthesia complications

## 2015-12-13 NOTE — Anesthesia Preprocedure Evaluation (Signed)
Anesthesia Evaluation  Patient identified by MRN, date of birth, ID band Patient awake    Reviewed: Allergy & Precautions, H&P , NPO status , Patient's Chart, lab work & pertinent test results, reviewed documented beta blocker date and time   Airway Mallampati: II  TM Distance: >3 FB Neck ROM: full    Dental no notable dental hx. (+) Dental Advisory Given, Teeth Intact   Pulmonary shortness of breath and with exertion, sleep apnea , COPD, former smoker,    Pulmonary exam normal breath sounds clear to auscultation       Cardiovascular hypertension, Pt. on home beta blockers + Peripheral Vascular Disease   Rhythm:regular Rate:Normal  palpitations   Neuro/Psych negative neurological ROS  negative psych ROS   GI/Hepatic negative GI ROS, Neg liver ROS, GERD  Medicated,  Endo/Other  diabetes, Type 2, Oral Hypoglycemic AgentsHypothyroidism Morbid obesity  Renal/GU negative Renal ROS  negative genitourinary   Musculoskeletal   Abdominal (+) + obese,   Peds  Hematology negative hematology ROS (+)   Anesthesia Other Findings   Reproductive/Obstetrics negative OB ROS                             Anesthesia Physical Anesthesia Plan  ASA: III  Anesthesia Plan: General   Post-op Pain Management:    Induction: Intravenous  Airway Management Planned: LMA  Additional Equipment:   Intra-op Plan:   Post-operative Plan:   Informed Consent: I have reviewed the patients History and Physical, chart, labs and discussed the procedure including the risks, benefits and alternatives for the proposed anesthesia with the patient or authorized representative who has indicated his/her understanding and acceptance.   Dental Advisory Given  Plan Discussed with: CRNA  Anesthesia Plan Comments:         Anesthesia Quick Evaluation

## 2015-12-13 NOTE — Anesthesia Procedure Notes (Signed)
Procedure Name: LMA Insertion Date/Time: 12/13/2015 1:56 PM Performed by: Izora Gala Pre-anesthesia Checklist: Patient identified, Emergency Drugs available, Suction available and Patient being monitored Patient Re-evaluated:Patient Re-evaluated prior to inductionOxygen Delivery Method: Circle system utilized Preoxygenation: Pre-oxygenation with 100% oxygen Intubation Type: IV induction Ventilation: Mask ventilation without difficulty LMA Size: 4.0 Number of attempts: 1 Placement Confirmation: positive ETCO2 and breath sounds checked- equal and bilateral Tube secured with: Tape Dental Injury: Teeth and Oropharynx as per pre-operative assessment

## 2015-12-13 NOTE — Op Note (Signed)
  Patient Name:           Susan [name]   Date of Surgery:        12/13/2015  Pre op Diagnosis:      Recurrent cellulitis left mastectomy wound  Post op Diagnosis:    Seroma left mastectomy wound, possibly infected  Procedure:                 Incision and drainage left breast fluid collection  Surgeon:                     Edsel Petrin. Dalbert Batman, M.D., FACS  Assistant:                      OR staff  Operative Indications:   This is a 65 year old Caucasian female with diabetes and elevated BMI, COPD, hypertension, former smoker, recently quit.On October 26, 2015 she underwent left prophylactic mastectomy and right total mastectomy and sentinel node biopsy. She had DCIS on the right with negative margins and negative nodes. ADH on the left.  She has been treated for cellulitis at least twice.     This past weekend she was admitted for 2 days for IV antibiotics.  Cellulitis had returned.  He improved rapidly but ultrasound showed a multiloculated fluid collection.  She was told by my partners that she needed to have this drained in the OR.  She was discharged from the hospital on Sunday and instructed to contact me.  We discussed this with her on Monday and she is scheduled for surgery on Tuesday, 12/13/2015.   Marland Kitchen  Operative Findings:       There was a unilocular fluid collection under the left lateral mastectomy incision.  This was moderately tense and but only slightly tender with minimal erythema.  When I entered the cavity it looked like clear seroma fluid without odor.  I cultured it in case it had been secondarily infected.    Procedure in Detail:          Following the induction of general anesthesia the patient's left chest wall was prepped and draped in a sterile fashion.  Surgical timeout was performed.  Intravenous vancomycin was given.  A 2 inch transverse incision was made at the lateral aspect of the left mastectomy incision.  I entered the seroma cavity.  I obtained cultures from the  depths of the wound and sent that to the lab.  The wound was explored and found to be a unilocular cavity.  After cauterizing the base of the wound it was packed with 2 inch vaginal packing moistened in saline.  Hemostasis seemed quite good.  Color 4 x 4's and ABDs.  The patient tolerated the procedure well was taken to PACU in stable condition.  EBL 20 mL.  Counts correct.  Complications none.     Edsel Petrin. Dalbert Batman, M.D., FACS General and Minimally Invasive Surgery Breast and Colorectal Surgery  12/13/2015 2:33 PM

## 2015-12-14 ENCOUNTER — Encounter (HOSPITAL_COMMUNITY): Payer: Self-pay | Admitting: General Surgery

## 2015-12-16 ENCOUNTER — Ambulatory Visit: Payer: 59 | Admitting: Hematology and Oncology

## 2015-12-18 LAB — AEROBIC/ANAEROBIC CULTURE W GRAM STAIN (SURGICAL/DEEP WOUND): Culture: NO GROWTH

## 2015-12-18 LAB — AEROBIC/ANAEROBIC CULTURE (SURGICAL/DEEP WOUND)

## 2015-12-27 ENCOUNTER — Ambulatory Visit (HOSPITAL_BASED_OUTPATIENT_CLINIC_OR_DEPARTMENT_OTHER): Payer: 59 | Admitting: Hematology and Oncology

## 2015-12-27 DIAGNOSIS — Z853 Personal history of malignant neoplasm of breast: Secondary | ICD-10-CM | POA: Diagnosis not present

## 2015-12-27 DIAGNOSIS — C50411 Malignant neoplasm of upper-outer quadrant of right female breast: Secondary | ICD-10-CM

## 2015-12-27 NOTE — Progress Notes (Signed)
Patient Care Team: [name], MD as PCP - General (Family Medicine) Fanny Skates, MD as Consulting Physician (General Surgery) Nicholas Lose, MD as Consulting Physician (Hematology and Oncology)  DIAGNOSIS: Breast cancer of upper-outer quadrant of right female breast [facility])   Staging form: Breast, AJCC 7th Edition   - Clinical stage from 08/26/2015: Stage 0 (Tis (DCIS), N0, M0) - Unsigned   - Pathologic stage from 10/26/2015: Stage 0 (Tis (DCIS), N0, cM0) - Unsigned  SUMMARY OF ONCOLOGIC HISTORY:   Breast cancer of upper-outer quadrant of right female breast ([place])   09/15/2002 Initial Biopsy    Left breast DCIS ER/PR positive, tamoxifen briefly develop blood clots and was discontinued      08/18/2015 Mammogram    Right breast: New indeterminate calcifications in the right breast. No suspicious findings on the left.      08/26/2015 Initial Diagnosis    Right breast biopsy: DCIS with calcifications, complex sclerosing lesion, ER 0%, PR 0%, intermediate to high-grade      08/26/2015 Clinical Stage    Stage 0: Tis N0      09/15/2015 Procedure    [facility] next panel: VUS at ATM otherwise negative at ATM, BAP1, BARD1, BRCA1, BRCA2, BRIP1, CDH1, CDK4, CDKN2A, CHEK2, EPCAM, FANCC, MITF, MLH1, MSH2, MSH6, NBN, PALB2, PMS2, PTEN, RAD51C, RAD51D, TP53, and XRCC2.       10/26/2015 Definitive Surgery    Bilateral mastectomy: LEFT: ADH with fibrocystic changes, radial scars, 0/1 LN; RIGHT: DCIS with calcs, high grade, 3.0 cm, neg margins; 0/3 LN, margins clear      10/26/2015 Pathologic Stage    Stage 0: Tis N0      11/02/2015 Survivorship    SCP mailed to patient in lieu of in person visit at her request       CHIEF COMPLIANT: Follow-up of recent bilateral mastectomies  INTERVAL HISTORY: Susan [name] a 65 year old with above-mentioned history of DCIS who underwent bilateral mastectomies and [name] here today to discuss pathology report. She [name] complaining of soreness in the breast and  thinks it may be an infection. She was recently treated with antibiotics. She [name] seeing surgery later today.  REVIEW OF SYSTEMS:   Constitutional: Denies fevers, chills or abnormal weight loss Eyes: Denies blurriness of vision Ears, nose, mouth, throat, and face: Denies mucositis or sore throat Respiratory: Denies cough, dyspnea or wheezes Cardiovascular: Denies palpitation, chest discomfort Gastrointestinal:  Denies nausea, heartburn or change in bowel habits Skin: Denies abnormal skin rashes Lymphatics: Denies new lymphadenopathy or easy bruising Neurological:Denies numbness, tingling or new weaknesses Behavioral/Psych: Mood [name] stable, no new changes  Extremities: No lower extremity edema Breast: Recent bilateral mastectomies with redness and discomfort in the left chest wall All other systems were reviewed with the patient and are negative.  I have reviewed the past medical history, past surgical history, social history and family history with the patient and they are unchanged from previous note.  ALLERGIES:  [name] allergic to chantix [varenicline]; ciprofloxacin; penicillins; tamoxifen; levaquin [levofloxacin]; lyrica [pregabalin]; neurontin [gabapentin]; morphine and related; ceclor [cefaclor]; other; and sulfa antibiotics.  MEDICATIONS:  Current Outpatient Prescriptions  Medication Sig Dispense Refill  . albuterol (PROVENTIL HFA;VENTOLIN HFA) 108 (90 BASE) MCG/ACT inhaler Inhale 2 puffs into the lungs every 4 (four) hours as needed for wheezing or shortness of breath (cough, shortness of breath or wheezing.). 1 Inhaler 1  . Ascorbic Acid (VITAMIN C PO) Take 1 tablet by mouth 3 (three) times daily.    . cefdinir (  OMNICEF) 300 MG capsule Take 1 capsule (300 mg total) by mouth 2 (two) times daily. 10 capsule 0  . cholecalciferol (VITAMIN D) 1000 UNITS tablet Take 1,000 Units by mouth daily.    . cyclobenzaprine (FLEXERIL) 10 MG tablet Take 1 tablet (10 mg total) by mouth 3 (three) times  daily as needed for muscle spasms. 60 tablet 5  . DULoxetine (CYMBALTA) 30 MG capsule Take 3 capsules (90 mg total) by mouth daily. 270 capsule 1  . esomeprazole (NEXIUM) 20 MG capsule Take 20 mg by mouth daily at 12 noon.    . fenofibrate 160 MG tablet Take 1 tablet (160 mg total) by mouth daily. 90 tablet 3  . hydrochlorothiazide (HYDRODIURIL) 25 MG tablet TAKE 1 TABLET (25 MG TOTAL) BY MOUTH DAILY. 30 tablet 5  . HYDROcodone-acetaminophen (NORCO) 5-325 MG tablet Take 1-2 tablets by mouth every 6 (six) hours as needed for moderate pain or severe pain. 30 tablet 0  . HYDROcodone-acetaminophen (NORCO/VICODIN) 5-325 MG tablet Take 1 tablet by mouth every 6 (six) hours as needed for severe pain. 8 tablet 0  . ibuprofen (ADVIL,MOTRIN) 200 MG tablet Take 800 mg by mouth at bedtime.    Marland Kitchen levothyroxine (SYNTHROID, LEVOTHROID) 25 MCG tablet Take 1 tablet (25 mcg total) by mouth daily. 90 tablet 1  . metFORMIN (GLUCOPHAGE) 500 MG tablet Take 1 tablet (500 mg total) by mouth 2 (two) times daily with a meal. 180 tablet 1  . metoprolol (LOPRESSOR) 50 MG tablet TAKE 1 TABLET (50 MG TOTAL) BY MOUTH 2 (TWO) TIMES DAILY. 180 tablet 3  . Multiple Vitamins-Minerals (MULTIVITAMIN WITH MINERALS) tablet Take 1 tablet by mouth daily.    . temazepam (RESTORIL) 7.5 MG capsule Take 1 capsule (7.5 mg total) by mouth at bedtime as needed for sleep. 30 capsule 5   No current facility-administered medications for this visit.     PHYSICAL EXAMINATION: ECOG PERFORMANCE STATUS: 1 - Symptomatic but completely ambulatory  Vitals:   12/27/15 1117  BP: 134/71  Pulse: 86  Resp: (!) 21  Temp: 98.5 F (36.9 C)   Filed Weights   12/27/15 1117  Weight: 230 lb 14.4 oz (104.7 kg)    GENERAL:alert, no distress and comfortable SKIN: skin color, texture, turgor are normal, no rashes or significant lesions EYES: normal, Conjunctiva are pink and non-injected, sclera clear OROPHARYNX:no exudate, no erythema and lips, buccal  mucosa, and tongue normal  NECK: supple, thyroid normal size, non-tender, without nodularity LYMPH:  no palpable lymphadenopathy in the cervical, axillary or inguinal LUNGS: clear to auscultation and percussion with normal breathing effort HEART: regular rate & rhythm and no murmurs and no lower extremity edema ABDOMEN:abdomen soft, non-tender and normal bowel sounds MUSCULOSKELETAL:no cyanosis of digits and no clubbing  NEURO: alert & oriented x 3 with fluent speech, no focal motor/sensory deficits EXTREMITIES: No lower extremity edema  LABORATORY DATA:  I have reviewed the data as listed   Chemistry      Component Value Date/Time   NA 137 12/11/2015 0903   NA 141 09/15/2015 1035   K 4.0 12/11/2015 0903   K 4.0 09/15/2015 1035   CL 100 (L) 12/11/2015 0903   CO2 29 12/11/2015 0903   CO2 30 (H) 09/15/2015 1035   BUN 13 12/11/2015 0903   BUN 15.5 09/15/2015 1035   CREATININE 0.72 12/11/2015 0903   CREATININE 0.9 09/15/2015 1035      Component Value Date/Time   CALCIUM 9.4 12/11/2015 0903   CALCIUM 9.8 09/15/2015 1035  ALKPHOS 52 12/09/2015 1839   ALKPHOS 60 09/15/2015 1035   AST 25 12/09/2015 1839   AST 35 (H) 09/15/2015 1035   ALT 29 12/09/2015 1839   ALT 53 09/15/2015 1035   BILITOT 0.7 12/09/2015 1839   BILITOT 0.31 09/15/2015 1035       Lab Results  Component Value Date   WBC 5.7 12/11/2015   HGB 12.1 12/11/2015   HCT 36.3 12/11/2015   MCV 84.4 12/11/2015   PLT 325 12/11/2015   NEUTROABS 1.5 (L) 10/18/2015     ASSESSMENT & PLAN:  Breast cancer of upper-outer quadrant of right female breast ([place]) Bilateral mastectomy 10/26/2015:  LEFT: ADH with fibrocystic changes, radial scars, 0/1 LN;  RIGHT: DCIS with calcs, high grade, 3.0 cm, neg margins; 0/3 LN, ER 0%, PR 0%  Pathology review: I discussed final pathology reported the patient provided with a copy of the report. Since she had bilateral mastectomies and no residual disease [name] left. [name] no indication for  further treatments for DCIS.  Recommendation: Surgery and Survivorship follow-up for surveillance. We are happy to see her on an as-needed basis.   No orders of the defined types were placed in this encounter.  The patient has a good understanding of the overall plan. she agrees with it. she will call with any problems that may develop before the next visit here.   Rulon Eisenmenger, MD 12/27/15

## 2015-12-27 NOTE — Assessment & Plan Note (Signed)
Bilateral mastectomy 10/26/2015:  LEFT: ADH with fibrocystic changes, radial scars, 0/1 LN;  RIGHT: DCIS with calcs, high grade, 3.0 cm, neg margins; 0/3 LN, ER 0%, PR 0%  Pathology review: I discussed final pathology reported the patient provided with a copy of the report. Since she had bilateral mastectomies and no residual disease is left. Is no indication for further treatments for DCIS.  Recommendation: Survivorship follow-up annually

## 2016-01-03 ENCOUNTER — Ambulatory Visit: Payer: 59 | Admitting: Physical Therapy

## 2016-01-03 ENCOUNTER — Telehealth: Payer: Self-pay

## 2016-01-03 NOTE — Telephone Encounter (Signed)
Pt says the pharmacy sent a request for her metoprolol last week, but she or they have not heard from [place].  She is going to have them send it again.  [phone]

## 2016-01-04 ENCOUNTER — Other Ambulatory Visit: Payer: Self-pay | Admitting: Family Medicine

## 2016-01-04 ENCOUNTER — Ambulatory Visit: Payer: 59 | Attending: General Surgery | Admitting: Physical Therapy

## 2016-01-04 DIAGNOSIS — R222 Localized swelling, mass and lump, trunk: Secondary | ICD-10-CM

## 2016-01-04 DIAGNOSIS — M6281 Muscle weakness (generalized): Secondary | ICD-10-CM

## 2016-01-04 DIAGNOSIS — I10 Essential (primary) hypertension: Secondary | ICD-10-CM

## 2016-01-04 DIAGNOSIS — M25612 Stiffness of left shoulder, not elsewhere classified: Secondary | ICD-10-CM | POA: Diagnosis not present

## 2016-01-04 NOTE — Therapy (Signed)
[place], [place], [address] Phone: [phone]   Fax:  [phone]  Physical Therapy Evaluation  Patient Details  Name: Susan [name] MRN: [id number] Date of Birth: 12/11/1950 Referring Provider: Dr. Renelda Loma   Encounter Date: 01/04/2016      PT End of Session - 01/04/16 1456    Visit Number 1   Number of Visits 9  pt may be limited if she has to pay a copay   Date for PT Re-Evaluation 02/04/16   PT Start Time 0930   PT Stop Time 1015   PT Time Calculation (min) 45 min   Activity Tolerance Patient tolerated treatment well   Behavior During Therapy [facility] for tasks assessed/performed      Past Medical History:  Diagnosis Date  . Anemia 01/28/2012  . Anxiety   . Cancer [facility]) 2004   ductal carcinoma; lumpectomy  . Cellulitis of chest wall 12/09/2015  . Complication of anesthesia    difficulty voiding after anesthesia  . COPD (chronic obstructive pulmonary disease) ([facility])    mild  . DDD (degenerative disc disease)   . Depression   . Diabetes mellitus without complication ([place])   . DJD (degenerative joint disease)   . Dysrhythmia    hx palpitations?panic attack  . Fatty liver disease, nonalcoholic   . Fibromyalgia   . GERD (gastroesophageal reflux disease)   . Hip pain, right 10/12/2013  . History of blood clots 2004   during cancer treatment  . HTN (hypertension) 01/28/2012  . Hyperglycemia 01/28/2012  . Hyperlipidemia 01/28/2012  . Hypertension   . Hypothyroidism 02/14/2014  . Muscle spasm 12/30/2011  . Nasal polyp 08/30/2012  . Obesity   . Osteoarthritis   . Osteopenia   . Pain of left heel 01/28/2012  . Peripheral neuropathy ([place])   . Peripheral vascular disease ([place]) 04   axillary,upper chest after 3 weeks on tamoxifen  . Pneumonia    hx  . Preventative health care 01/28/2012  . Shortness of breath dyspnea    occ on exersion  . Sleep apnea    on CPAP  . Tobacco abuse-unspec  10/12/2013  . Tuberculosis    latent  finishing 9 months tx on last month    Past Surgical History:  Procedure Laterality Date  . ABDOMINAL HYSTERECTOMY  1991  . APPENDECTOMY  91  . BILATERAL OOPHORECTOMY  01/2003  . BREAST SURGERY Left 2004   lumpectomy  . CARPAL TUNNEL RELEASE     right  . INCISION AND DRAINAGE ABSCESS Left 12/13/2015   Procedure: DRAINAGE LEFT MASTECTOMY WOUND INFECTION;  Surgeon: Fanny Skates, MD;  Location: [place];  Service: General;  Laterality: Left;  . JOINT REPLACEMENT     right total knee  . MASTECTOMY W/ SENTINEL NODE BIOPSY Right 10/26/2015  . MASTECTOMY W/ SENTINEL NODE BIOPSY Bilateral 10/26/2015   Procedure: RIGHT TOTAL MASTECTOMY WITH RIGHT SENTINEL LYMPH NODE BIOPSY, INJECT BLUE DYE RIGHT BREAST, LEFT BREAST PROPHYLACTIC MASTECTOMY;  Surgeon: Fanny Skates, MD;  Location: [place];  Service: General;  Laterality: Bilateral;  . NASAL SEPTUM SURGERY  08  . PILONIDAL CYST EXCISION    . THYROIDECTOMY, PARTIAL  mid 80's   right side    There were no vitals filed for this visit.       Subjective Assessment - 01/04/16 0937    Subjective Dr.Ingram wants me to get physical therapy to help with shoulder    Pertinent History DCIS in right breast with bilateral  mastectomy October 26, 2195 with 2 nodes removed.  Hx DCIS in 2004 with lumpectomy with no lymph nodes removed and 32 radiation treatments Developed redness swelling and pain in right chest with 3 tubes for 5 weeks and had antibiotic for infection in right chest  and developed problems rasising left arm and found to have a seroma on the left and had surgery for seroma on 12/13/2015 and still has an open wound Past history includes pinched nerves in neck and low back. she thinks she has something in her hip because she has pain when she walks a long distnace.     Patient Stated Goals wants to get back as close as possible    Currently in Pain? No/denies   Pain Score --  pt says she sometimes has pain in left arm  she thinks is coming from her neck.  She says she cannot walk a long distance because of hip pain, but she has not pain at this time             [facility] - 01/04/16 0001      Assessment   Medical Diagnosis breast cancer    Referring Provider Dr. Mikey BussingHaywood    Onset Date/Surgical Date 10/26/15   Hand Dominance --  writes with left, but does everything else with right      Precautions   Precautions Other (comment)   Precaution Comments recent surgery      Restrictions   Weight Bearing Restrictions No     Balance Screen   Has the patient fallen in the past 6 months Yes   How many times? 1  tripped over grandson toy   Has the patient had a decrease in activity level because of a fear of falling?  No   Is the patient reluctant to leave their home because of a fear of falling?  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 about 3x1.5cm with puffy area visible above incision line. Well healed incision line on right chest    Quick DASH  22.73     Coordination   Gross Motor Movements are Fluid and Coordinated No     Posture/Postural Control   Posture/Postural Control No significant limitations     AROM   Overall AROM Comments Pt with generalized deconditoning.  UE strength about 3+/5   Right Shoulder Flexion 168 Degrees   Right Shoulder ABduction 170 Degrees   Right  Shoulder External Rotation 90 Degrees   Left Shoulder Flexion 158 Degrees  pulling in lateral incision area   Left Shoulder ABduction 140 Degrees  pain    Left Shoulder External Rotation 85 Degrees   Cervical - Right Rotation 45  pain   Cervical - Left Rotation 45  with pain           LYMPHEDEMA/ONCOLOGY QUESTIONNAIRE - 01/04/16  1006      Right Upper Extremity Lymphedema   15 cm Proximal to Olecranon Process 42 cm   10 cm Proximal to Olecranon Process 39.5 cm   Olecranon Process 29.5 cm   15 cm Proximal to Ulnar Styloid Process 29.5 cm   10 cm Proximal to Ulnar Styloid Process 27 cm   Just Proximal to Ulnar Styloid Process 18 cm   Across Hand at [employer] 21.3 cm   At [place] of 2nd Digit 6.3 cm     Left Upper Extremity Lymphedema   15 cm Proximal to Olecranon Process 42.5 cm   10 cm Proximal to Olecranon Process 41 cm   Olecranon Process 30.7 cm   15 cm Proximal to Ulnar Styloid Process 29.5 cm   10 cm Proximal to Ulnar Styloid Process 27 cm   Just Proximal to Ulnar Styloid Process 17.4 cm   Across Hand at [employer] 21.3 cm   At [place] of 2nd Digit 6 cm           Quick Dash - 01/04/16 0001    Open a tight or new jar Mild difficulty   Do heavy household chores (wash walls, wash floors) Moderate difficulty   Carry a shopping bag or briefcase No difficulty   Wash your back Mild difficulty   Use a knife to cut food No difficulty   Recreational activities in which you take some force or impact through your arm, shoulder, or hand (golf, hammering, tennis) Moderate difficulty   During the past week, to what extent has your arm, shoulder or hand problem interfered with your normal social activities with family, friends, neighbors, or groups? Slightly   During the past week, to what extent has your arm, shoulder or hand problem limited your work or other regular daily activities Slightly   Arm, shoulder, or hand pain. Mild   Tingling (pins and needles) in your arm,  shoulder, or hand Mild   Difficulty Sleeping No difficulty   DASH Score 22.73 %                             Long Term Clinic Goals - 01/04/16 1509      CC Long Term Goal  #1   Title pt will be independent in self manual lymph drainage for left upper chest    Time 4   Period Weeks   Status New     CC Long Term Goal  #2   Title Pt will be indendent in home program for neck and shoulder range of motion  so that she can reach off a high shelf without pain    Time 4   Period Weeks   Status New     CC Long Term Goal  #3   Title pt will report she can do a progressive generalized exercise program at home so that she will be able to care for her grandson without difficulty    Time 4   Period Weeks   Status New            Plan - 01/04/16 1457    Clinical Impression Statement 65 yo with obesity and, per her report, problems with her neck and back that are causing pain and limiting mobility, comes to PT after bilateral mastectomy and recent surgery for seroma removal.  She may be limited in visits if she has to pay a copay.    Rehab Potential  Good   Clinical Impairments Affecting Rehab Potential delayed healing of seroma surgery on left chest    PT Frequency 2x / week   PT Duration 4 weeks   PT Treatment/Interventions ADLs/Self Care Home Management;Therapeutic activities;Therapeutic exercise;Taping;Manual lymph drainage;Patient/family education;DME Instruction;Neuromuscular re-education   PT Next Visit Plan Manual lymph draiange to left anterior chest and active and passive range of motion exercise to left shoulder. Instruc in HEP for neck and  shoulder range of motion, ( cane exercise, post op ex handout)  test and document  sit to stand in 30 sec.   Consulted and Agree with Plan of Care Patient      Patient will benefit from skilled therapeutic intervention in order to improve the following deficits and impairments:  Decreased range of motion, Obesity, Impaired  UE functional use, Decreased knowledge of precautions, Decreased skin integrity, Impaired perceived functional ability, Pain, Decreased knowledge of use of DME, Decreased strength, Increased edema  Visit Diagnosis: Stiffness of left shoulder, not elsewhere classified - Plan: PT plan of care cert/re-cert  Localized swelling, mass and lump, trunk - Plan: PT plan of care cert/re-cert  Muscle weakness (generalized) - Plan: PT plan of care cert/re-cert     Problem List Patient Active Problem List   Diagnosis Date Noted  . Induration, breast   . Left shoulder pain   . Abscess 12/09/2015  . Cancer of central portion of right breast ([place]) 10/26/2015  . Genetic testing 10/17/2015  . History of breast cancer in female 09/15/2015  . Breast cancer of upper-outer quadrant of right female breast ([place]) 09/15/2015  . Family history of breast cancer in female 09/15/2015  . Family history of colon cancer 09/15/2015  . TB lung, latent 01/11/2015  . COPD (chronic obstructive pulmonary disease) ([place]) 10/07/2014  . Mediastinal lymphadenopathy 10/07/2014  . Diabetes mellitus type 2, controlled ([place]) 09/29/2014  . Pulmonary nodule 09/29/2014  . History of chest pain 09/29/2014  . Essential hypertension 09/29/2014  . Chest pain 09/23/2014  . Hypothyroidism 02/14/2014  . Tobacco abuse-unspec 10/12/2013  . Hip pain, right 10/12/2013  . Cervical cancer screening 03/09/2013  . Seasonal allergies 09/12/2012  . Nasal polyp 08/30/2012  . SOM (serous otitis media) 08/30/2012  . Dysuria 07/20/2012  . Peripheral neuropathy ([place]) 07/19/2012  . Cervical myofascial pain syndrome 06/08/2012  . Anemia 01/28/2012  . HTN (hypertension) 01/28/2012  . Hyperlipidemia 01/28/2012  . Hyperglycemia 01/28/2012  . Preventative health care 01/28/2012  . Pain of left heel 01/28/2012  . Neck pain 12/30/2011  . Muscle spasm 12/30/2011  . Anxiety and depression   . Osteoarthritis   . GERD (gastroesophageal reflux  disease)   . Fibromyalgia   . Sleep apnea   . Obesity   . History of blood clots   . Fatty liver disease, nonalcoholic   . DDD (degenerative disc disease)    Donato Heinz. Owens Shark PT  Norwood Levo 01/04/2016, 3:15 PM  [place], [place], [address] Phone: [phone]   Fax:  [phone]  Name: JAYN CHRISTOPHE MRN: [id number] Date of Birth: 1951/02/07

## 2016-01-05 ENCOUNTER — Ambulatory Visit: Payer: 59

## 2016-01-05 DIAGNOSIS — M6281 Muscle weakness (generalized): Secondary | ICD-10-CM

## 2016-01-05 DIAGNOSIS — M25612 Stiffness of left shoulder, not elsewhere classified: Secondary | ICD-10-CM

## 2016-01-05 DIAGNOSIS — R222 Localized swelling, mass and lump, trunk: Secondary | ICD-10-CM

## 2016-01-05 NOTE — Therapy (Signed)
[place], [place], [address] Phone: [phone]   Fax:  [phone]  Physical Therapy Treatment  Patient Details  Name: Susan [name] MRN: [id number] Date of Birth: 12/20/1950 Referring Provider: Dr. Renelda Loma   Encounter Date: 01/05/2016      PT End of Session - 01/05/16 1056    Visit Number 2   Number of Visits 9   Date for PT Re-Evaluation 02/04/16   PT Start Time 0840   PT Stop Time 0930   PT Time Calculation (min) 50 min   Activity Tolerance Patient tolerated treatment well   Behavior During Therapy [facility] for tasks assessed/performed      Past Medical History:  Diagnosis Date  . Anemia 01/28/2012  . Anxiety   . Cancer [facility]) 2004   ductal carcinoma; lumpectomy  . Cellulitis of chest wall 12/09/2015  . Complication of anesthesia    difficulty voiding after anesthesia  . COPD (chronic obstructive pulmonary disease) (HCC)    mild  . DDD (degenerative disc disease)   . Depression   . Diabetes mellitus without complication ([place])   . DJD (degenerative joint disease)   . Dysrhythmia    hx palpitations?panic attack  . Fatty liver disease, nonalcoholic   . Fibromyalgia   . GERD (gastroesophageal reflux disease)   . Hip pain, right 10/12/2013  . History of blood clots 2004   during cancer treatment  . HTN (hypertension) 01/28/2012  . Hyperglycemia 01/28/2012  . Hyperlipidemia 01/28/2012  . Hypertension   . Hypothyroidism 02/14/2014  . Muscle spasm 12/30/2011  . Nasal polyp 08/30/2012  . Obesity   . Osteoarthritis   . Osteopenia   . Pain of left heel 01/28/2012  . Peripheral neuropathy ([place])   . Peripheral vascular disease ([place]) 04   axillary,upper chest after 3 weeks on tamoxifen  . Pneumonia    hx  . Preventative health care 01/28/2012  . Shortness of breath dyspnea    occ on exersion  . Sleep apnea    on CPAP  . Tobacco abuse-unspec 10/12/2013  . Tuberculosis    latent  finishing 9  months tx on last month    Past Surgical History:  Procedure Laterality Date  . ABDOMINAL HYSTERECTOMY  1991  . APPENDECTOMY  91  . BILATERAL OOPHORECTOMY  01/2003  . BREAST SURGERY Left 2004   lumpectomy  . CARPAL TUNNEL RELEASE     right  . INCISION AND DRAINAGE ABSCESS Left 12/13/2015   Procedure: DRAINAGE LEFT MASTECTOMY WOUND INFECTION;  Surgeon: Fanny Skates, MD;  Location: [place];  Service: General;  Laterality: Left;  . JOINT REPLACEMENT     right total knee  . MASTECTOMY W/ SENTINEL NODE BIOPSY Right 10/26/2015  . MASTECTOMY W/ SENTINEL NODE BIOPSY Bilateral 10/26/2015   Procedure: RIGHT TOTAL MASTECTOMY WITH RIGHT SENTINEL LYMPH NODE BIOPSY, INJECT BLUE DYE RIGHT BREAST, LEFT BREAST PROPHYLACTIC MASTECTOMY;  Surgeon: Fanny Skates, MD;  Location: [place];  Service: General;  Laterality: Bilateral;  . NASAL SEPTUM SURGERY  08  . PILONIDAL CYST EXCISION    . THYROIDECTOMY, PARTIAL  mid 80's   right side    There were no vitals filed for this visit.      Subjective Assessment - 01/05/16 0844    Subjective My Lt shoulder feels okay today, it really only hurts when I move it.    Pertinent History DCIS in right breast with bilateral mastectomy October 26, 2195 with 2 nodes removed.  Hx  DCIS in 2004 with lumpectomy with no lymph nodes removed and 32 radiation treatments Developed redness swelling and pain in right chest with 3 tubes for 5 weeks and had antibiotic for infection in right chest  and developed problems rasising left arm and found to have a seroma on the left and had surgery for seroma on 12/13/2015 and still has an open wound Past history includes pinched nerves in neck and low back. she thinks she has something in her hip because she has pain when she walks a long distnace.     Patient Stated Goals wants to get back as close as possible    Currently in Pain? No/denies               LYMPHEDEMA/ONCOLOGY QUESTIONNAIRE - 01/04/16 1006      Right Upper Extremity  Lymphedema   15 cm Proximal to Olecranon Process 42 cm   10 cm Proximal to Olecranon Process 39.5 cm   Olecranon Process 29.5 cm   15 cm Proximal to Ulnar Styloid Process 29.5 cm   10 cm Proximal to Ulnar Styloid Process 27 cm   Just Proximal to Ulnar Styloid Process 18 cm   Across Hand at [employer] 21.3 cm   At [place] of 2nd Digit 6.3 cm     Left Upper Extremity Lymphedema   15 cm Proximal to Olecranon Process 42.5 cm   10 cm Proximal to Olecranon Process 41 cm   Olecranon Process 30.7 cm   15 cm Proximal to Ulnar Styloid Process 29.5 cm   10 cm Proximal to Ulnar Styloid Process 27 cm   Just Proximal to Ulnar Styloid Process 17.4 cm   Across Hand at [employer] 21.3 cm   At [place] of 2nd Digit 6 cm                  [facility] Adult PT Treatment/Exercise - 01/05/16 0001      Shoulder Exercises: Standing   Flexion AROM;5 reps;Both   Flexion Limitations In front of mirror to work on no scapular compensations   ABduction AROM;Left;5 reps   ABduction Limitations In front of mirror with tactile cuing from therapist to decrease scapular compensations, pt able to return very good control of this after a few repititions.     Shoulder Exercises: Pulleys   Flexion 2 minutes   ABduction 2 minutes   ABduction Limitations VC to decrease scapular compensations.     Manual Therapy   Manual Lymphatic Drainage (MLD) In Supine: Short neck, superficial and deep abdominals, Lt inguinal nodes, Lt modified anterior upper trunk and Lt axillo-inguinal anastomosis and Lt upper arm.    Passive ROM In Supine: Lt shoulder into flexion, abduction and er to pts tolerance, pt intermittently had "nerve pain" from neck to shoulder but with scapular depression applied by therapist pain went away.                        [facility] Goals - 01/04/16 1509      CC Long Term Goal  #1   Title pt will be independent in self manual lymph drainage for left upper chest    Time 4    Period Weeks   Status New     CC Long Term Goal  #2   Title Pt will be indendent in home program for neck and shoulder range of motion  so that she can reach off a high shelf without pain  Time 4   Period Weeks   Status New     CC Long Term Goal  #3   Title pt will report she can do a progressive generalized exercise program at home so that she will be able to care for her grandson without difficulty    Time 4   Period Weeks   Status New            Plan - 01/05/16 1057    Clinical Impression Statement P t tolerated first session of exercises today very well. She was able to relax well during P/ROM but did not have increased pain or pulling at wound. She reported feeling really good after stretching. She also did very well with A/ROM practice in front of mirror showing very good awareness of Lt scapular compensations with A/ROM and did well with self correcting. She will not have a co pay so plans to come to all future appointments.  Pt wanted therapist to look at new areas she noticed last night in her wound. Small white areas noted, but nothing that pointed to signs of infection an dpt plans to update her doctor about this newer developement today as well.    Rehab Potential Good   Clinical Impairments Affecting Rehab Potential delayed healing of seroma surgery on left chest    PT Frequency 2x / week   PT Duration 4 weeks   PT Treatment/Interventions ADLs/Self Care Home Management;Therapeutic activities;Therapeutic exercise;Taping;Manual lymph drainage;Patient/family education;DME Instruction;Neuromuscular re-education   PT Next Visit Plan Cont manual lymph drainage to left anterior chest and A/P/ROM to left shoulder. Instruct in HEP for neck and shoulder range of motion, ( cane exercise, post op ex handout)  test and document sit to stand in 30 sec.   PT Home Exercise Plan Practice Lt shoulder A/ROM in front of mirror to decrease scapular compensations   Consulted and Agree with  Plan of Care Patient      Patient will benefit from skilled therapeutic intervention in order to improve the following deficits and impairments:  Decreased range of motion, Obesity, Impaired UE functional use, Decreased knowledge of precautions, Decreased skin integrity, Impaired perceived functional ability, Pain, Decreased knowledge of use of DME, Decreased strength, Increased edema  Visit Diagnosis: Stiffness of left shoulder, not elsewhere classified  Localized swelling, mass and lump, trunk  Muscle weakness (generalized)     Problem List Patient Active Problem List   Diagnosis Date Noted  . Induration, breast   . Left shoulder pain   . Abscess 12/09/2015  . Cancer of central portion of right breast ([place]) 10/26/2015  . Genetic testing 10/17/2015  . History of breast cancer in female 09/15/2015  . Breast cancer of upper-outer quadrant of right female breast ([place]) 09/15/2015  . Family history of breast cancer in female 09/15/2015  . Family history of colon cancer 09/15/2015  . TB lung, latent 01/11/2015  . COPD (chronic obstructive pulmonary disease) ([place]) 10/07/2014  . Mediastinal lymphadenopathy 10/07/2014  . Diabetes mellitus type 2, controlled ([place]) 09/29/2014  . Pulmonary nodule 09/29/2014  . History of chest pain 09/29/2014  . Essential hypertension 09/29/2014  . Chest pain 09/23/2014  . Hypothyroidism 02/14/2014  . Tobacco abuse-unspec 10/12/2013  . Hip pain, right 10/12/2013  . Cervical cancer screening 03/09/2013  . Seasonal allergies 09/12/2012  . Nasal polyp 08/30/2012  . SOM (serous otitis media) 08/30/2012  . Dysuria 07/20/2012  . Peripheral neuropathy ([place]) 07/19/2012  . Cervical myofascial pain syndrome 06/08/2012  . Anemia 01/28/2012  .  HTN (hypertension) 01/28/2012  . Hyperlipidemia 01/28/2012  . Hyperglycemia 01/28/2012  . Preventative health care 01/28/2012  . Pain of left heel 01/28/2012  . Neck pain 12/30/2011  . Muscle spasm 12/30/2011  .  Anxiety and depression   . Osteoarthritis   . GERD (gastroesophageal reflux disease)   . Fibromyalgia   . Sleep apnea   . Obesity   . History of blood clots   . Fatty liver disease, nonalcoholic   . DDD (degenerative disc disease)     Susan [name], PTA 01/05/2016, 11:05 AM  [place], [place], [address] Phone: [phone]   Fax:  [phone]  Name: Susan [name] MRN: [id number] Date of Birth: 04-Jul-1950

## 2016-01-11 ENCOUNTER — Ambulatory Visit: Payer: 59 | Attending: General Surgery | Admitting: Physical Therapy

## 2016-01-11 DIAGNOSIS — M25612 Stiffness of left shoulder, not elsewhere classified: Secondary | ICD-10-CM | POA: Diagnosis not present

## 2016-01-11 DIAGNOSIS — M6281 Muscle weakness (generalized): Secondary | ICD-10-CM | POA: Diagnosis present

## 2016-01-11 DIAGNOSIS — R222 Localized swelling, mass and lump, trunk: Secondary | ICD-10-CM | POA: Diagnosis present

## 2016-01-11 NOTE — Patient Instructions (Addendum)
[email or web address] Lymphatic flow series with Shoosh Lettick Crotzer     Deep breathing   Cane Overhead - Supine  Hold cane at thighs with both hands, extend arms straight over head. Hold ___ seconds. Repeat _5-19__ times.  BREATHE at the top    Hand glued to skin at front of left shoulder.  Semicircle stretch over the top to back of shoulder 5-7 times   Copyright  VHI. All rights reserved.

## 2016-01-11 NOTE — Therapy (Signed)
[place], [place], [address] Phone: [phone]   Fax:  [phone]  Physical Therapy Treatment  Patient Details  Name: Susan [name] MRN: [id number] Date of Birth: May 13, 1950 Referring Provider: Dr. Renelda Loma   Encounter Date: 01/11/2016      PT End of Session - 01/11/16 1304    Visit Number 3   Number of Visits 9   Date for PT Re-Evaluation 02/04/16   PT Start Time 1030  pt arrived late    PT Stop Time 1100   PT Time Calculation (min) 30 min   Activity Tolerance Patient tolerated treatment well   Behavior During Therapy [facility] for tasks assessed/performed      Past Medical History:  Diagnosis Date  . Anemia 01/28/2012  . Anxiety   . Cancer [facility]) 2004   ductal carcinoma; lumpectomy  . Cellulitis of chest wall 12/09/2015  . Complication of anesthesia    difficulty voiding after anesthesia  . COPD (chronic obstructive pulmonary disease) (HCC)    mild  . DDD (degenerative disc disease)   . Depression   . Diabetes mellitus without complication ([place])   . DJD (degenerative joint disease)   . Dysrhythmia    hx palpitations?panic attack  . Fatty liver disease, nonalcoholic   . Fibromyalgia   . GERD (gastroesophageal reflux disease)   . Hip pain, right 10/12/2013  . History of blood clots 2004   during cancer treatment  . HTN (hypertension) 01/28/2012  . Hyperglycemia 01/28/2012  . Hyperlipidemia 01/28/2012  . Hypertension   . Hypothyroidism 02/14/2014  . Muscle spasm 12/30/2011  . Nasal polyp 08/30/2012  . Obesity   . Osteoarthritis   . Osteopenia   . Pain of left heel 01/28/2012  . Peripheral neuropathy ([place])   . Peripheral vascular disease ([place]) 04   axillary,upper chest after 3 weeks on tamoxifen  . Pneumonia    hx  . Preventative health care 01/28/2012  . Shortness of breath dyspnea    occ on exersion  . Sleep apnea    on CPAP  . Tobacco abuse-unspec 10/12/2013  . Tuberculosis     latent  finishing 9 months tx on last month    Past Surgical History:  Procedure Laterality Date  . ABDOMINAL HYSTERECTOMY  1991  . APPENDECTOMY  91  . BILATERAL OOPHORECTOMY  01/2003  . BREAST SURGERY Left 2004   lumpectomy  . CARPAL TUNNEL RELEASE     right  . INCISION AND DRAINAGE ABSCESS Left 12/13/2015   Procedure: DRAINAGE LEFT MASTECTOMY WOUND INFECTION;  Surgeon: Fanny Skates, MD;  Location: [place];  Service: General;  Laterality: Left;  . JOINT REPLACEMENT     right total knee  . MASTECTOMY W/ SENTINEL NODE BIOPSY Right 10/26/2015  . MASTECTOMY W/ SENTINEL NODE BIOPSY Bilateral 10/26/2015   Procedure: RIGHT TOTAL MASTECTOMY WITH RIGHT SENTINEL LYMPH NODE BIOPSY, INJECT BLUE DYE RIGHT BREAST, LEFT BREAST PROPHYLACTIC MASTECTOMY;  Surgeon: Fanny Skates, MD;  Location: [place];  Service: General;  Laterality: Bilateral;  . NASAL SEPTUM SURGERY  08  . PILONIDAL CYST EXCISION    . THYROIDECTOMY, PARTIAL  mid 80's   right side    There were no vitals filed for this visit.      Subjective Assessment - 01/11/16 1028    Subjective Pt reports she had done alot of house work and cleaning over the long weekend and she had some soreness, but she feels better today    Pertinent History  DCIS in right breast with bilateral mastectomy October 26, 2195 with 2 nodes removed.  Hx DCIS in 2004 with lumpectomy with no lymph nodes removed and 32 radiation treatments Developed redness swelling and pain in right chest with 3 tubes for 5 weeks and had antibiotic for infection in right chest  and developed problems rasising left arm and found to have a seroma on the left and had surgery for seroma on 12/13/2015 and still has an open wound Past history includes pinched nerves in neck and low back. she thinks she has something in her hip because she has pain when she walks a long distnace.     Patient Stated Goals wants to get back as close as possible    Currently in Pain? No/denies                          [facility] Adult PT Treatment/Exercise - 01/11/16 0001      Neck Exercises: Seated   Other Seated Exercise lymphatic flow series      Lumbar Exercises: Aerobic   Stationary Bike 5 minutes at level 1     Shoulder Exercises: Supine   Other Supine Exercises dowel rod flexion with deep breath with arms overhead    Other Supine Exercises diphragmatic breathing      Manual Therapy   Manual Lymphatic Drainage (MLD) small stationary circles at anterior shoulder to posterior cervical nodes.                 PT Education - 01/11/16 1303    Education provided Yes   Education Details neck range of motion and dowel rod flexion, diaphragmatic breathing and stationary circles to left anterior shoulder    Person(s) Educated Patient   Methods Explanation;Demonstration   Comprehension Verbalized understanding;Returned demonstration                [facility] Goals - 01/04/16 1509      CC Long Term Goal  #1   Title pt will be independent in self manual lymph drainage for left upper chest    Time 4   Period Weeks   Status New     CC Long Term Goal  #2   Title Pt will be indendent in home program for neck and shoulder range of motion  so that she can reach off a high shelf without pain    Time 4   Period Weeks   Status New     CC Long Term Goal  #3   Title pt will report she can do a progressive generalized exercise program at home so that she will be able to care for her grandson without difficulty    Time 4   Period Weeks   Status New            Plan - 01/11/16 1305    Clinical Impression Statement Pt pleased with improvements with exercise. Discussed use of a tight cotten tank top to help keep dressing in place as she is getting skin irritation from tape    Rehab Potential Good   Clinical Impairments Affecting Rehab Potential delayed healing of seroma surgery on left chest    PT Frequency 2x / week   PT Duration 4 weeks    PT Treatment/Interventions ADLs/Self Care Home Management;Therapeutic activities;Therapeutic exercise;Taping;Manual lymph drainage;Patient/family education;DME Instruction;Neuromuscular re-education   PT Next Visit Plan Cont manual lymph drainage to left anterior chest and A/P/ROM to left shoulder. Instruct in HEP  for neck and shoulder range of motion, ( cane exercise, post op ex handout)  test and document sit to stand in 30 sec.   Consulted and Agree with Plan of Care Patient      Patient will benefit from skilled therapeutic intervention in order to improve the following deficits and impairments:  Decreased range of motion, Obesity, Impaired UE functional use, Decreased knowledge of precautions, Decreased skin integrity, Impaired perceived functional ability, Pain, Decreased knowledge of use of DME, Decreased strength, Increased edema  Visit Diagnosis: Stiffness of left shoulder, not elsewhere classified  Localized swelling, mass and lump, trunk  Muscle weakness (generalized)     Problem List Patient Active Problem List   Diagnosis Date Noted  . Induration, breast   . Left shoulder pain   . Abscess 12/09/2015  . Cancer of central portion of right breast ([place]) 10/26/2015  . Genetic testing 10/17/2015  . History of breast cancer in female 09/15/2015  . Breast cancer of upper-outer quadrant of right female breast ([place]) 09/15/2015  . Family history of breast cancer in female 09/15/2015  . Family history of colon cancer 09/15/2015  . TB lung, latent 01/11/2015  . COPD (chronic obstructive pulmonary disease) ([place]) 10/07/2014  . Mediastinal lymphadenopathy 10/07/2014  . Diabetes mellitus type 2, controlled ([place]) 09/29/2014  . Pulmonary nodule 09/29/2014  . History of chest pain 09/29/2014  . Essential hypertension 09/29/2014  . Chest pain 09/23/2014  . Hypothyroidism 02/14/2014  . Tobacco abuse-unspec 10/12/2013  . Hip pain, right 10/12/2013  . Cervical cancer screening  03/09/2013  . Seasonal allergies 09/12/2012  . Nasal polyp 08/30/2012  . SOM (serous otitis media) 08/30/2012  . Dysuria 07/20/2012  . Peripheral neuropathy ([place]) 07/19/2012  . Cervical myofascial pain syndrome 06/08/2012  . Anemia 01/28/2012  . HTN (hypertension) 01/28/2012  . Hyperlipidemia 01/28/2012  . Hyperglycemia 01/28/2012  . Preventative health care 01/28/2012  . Pain of left heel 01/28/2012  . Neck pain 12/30/2011  . Muscle spasm 12/30/2011  . Anxiety and depression   . Osteoarthritis   . GERD (gastroesophageal reflux disease)   . Fibromyalgia   . Sleep apnea   . Obesity   . History of blood clots   . Fatty liver disease, nonalcoholic   . DDD (degenerative disc disease)   Donato Heinz. Owens Shark PT   Norwood Levo 01/11/2016, 1:07 PM  [place], [place], [address] Phone: [phone]   Fax:  [phone]  Name: Susan [name] MRN: [id number] Date of Birth: [birth year]

## 2016-01-12 ENCOUNTER — Ambulatory Visit: Payer: 59 | Admitting: Physical Therapy

## 2016-01-12 DIAGNOSIS — M6281 Muscle weakness (generalized): Secondary | ICD-10-CM

## 2016-01-12 DIAGNOSIS — M25612 Stiffness of left shoulder, not elsewhere classified: Secondary | ICD-10-CM | POA: Diagnosis not present

## 2016-01-12 DIAGNOSIS — R222 Localized swelling, mass and lump, trunk: Secondary | ICD-10-CM

## 2016-01-12 NOTE — Therapy (Addendum)
[place], [place], [address] Phone: [phone]   Fax:  [phone]  Physical Therapy Treatment  Patient Details  Name: Susan [name] MRN: [id number] Date of Birth: 04-30-1951 Referring Provider: Dr. Renelda Loma   Encounter Date: 01/12/2016      PT End of Session - 01/12/16 1740    Visit Number 4   Number of Visits 9   Date for PT Re-Evaluation 02/04/16   PT Start Time 1430   PT Stop Time 1515   PT Time Calculation (min) 45 min   Activity Tolerance Patient tolerated treatment well   Behavior During Therapy [facility] for tasks assessed/performed      Past Medical History:  Diagnosis Date  . Anemia 01/28/2012  . Anxiety   . Cancer [facility]) 2004   ductal carcinoma; lumpectomy  . Cellulitis of chest wall 12/09/2015  . Complication of anesthesia    difficulty voiding after anesthesia  . COPD (chronic obstructive pulmonary disease) (HCC)    mild  . DDD (degenerative disc disease)   . Depression   . Diabetes mellitus without complication ([place])   . DJD (degenerative joint disease)   . Dysrhythmia    hx palpitations?panic attack  . Fatty liver disease, nonalcoholic   . Fibromyalgia   . GERD (gastroesophageal reflux disease)   . Hip pain, right 10/12/2013  . History of blood clots 2004   during cancer treatment  . HTN (hypertension) 01/28/2012  . Hyperglycemia 01/28/2012  . Hyperlipidemia 01/28/2012  . Hypertension   . Hypothyroidism 02/14/2014  . Muscle spasm 12/30/2011  . Nasal polyp 08/30/2012  . Obesity   . Osteoarthritis   . Osteopenia   . Pain of left heel 01/28/2012  . Peripheral neuropathy ([place])   . Peripheral vascular disease ([place]) 04   axillary,upper chest after 3 weeks on tamoxifen  . Pneumonia    hx  . Preventative health care 01/28/2012  . Shortness of breath dyspnea    occ on exersion  . Sleep apnea    on CPAP  . Tobacco abuse-unspec 10/12/2013  . Tuberculosis    latent  finishing 9  months tx on last month    Past Surgical History:  Procedure Laterality Date  . ABDOMINAL HYSTERECTOMY  1991  . APPENDECTOMY  91  . BILATERAL OOPHORECTOMY  01/2003  . BREAST SURGERY Left 2004   lumpectomy  . CARPAL TUNNEL RELEASE     right  . INCISION AND DRAINAGE ABSCESS Left 12/13/2015   Procedure: DRAINAGE LEFT MASTECTOMY WOUND INFECTION;  Surgeon: Fanny Skates, MD;  Location: [place];  Service: General;  Laterality: Left;  . JOINT REPLACEMENT     right total knee  . MASTECTOMY W/ SENTINEL NODE BIOPSY Right 10/26/2015  . MASTECTOMY W/ SENTINEL NODE BIOPSY Bilateral 10/26/2015   Procedure: RIGHT TOTAL MASTECTOMY WITH RIGHT SENTINEL LYMPH NODE BIOPSY, INJECT BLUE DYE RIGHT BREAST, LEFT BREAST PROPHYLACTIC MASTECTOMY;  Surgeon: Fanny Skates, MD;  Location: [place];  Service: General;  Laterality: Bilateral;  . NASAL SEPTUM SURGERY  08  . PILONIDAL CYST EXCISION    . THYROIDECTOMY, PARTIAL  mid 80's   right side    There were no vitals filed for this visit.      Subjective Assessment - 01/12/16 1443    Subjective pt experienced sharp pain in her right groin today after doing yoga series with legs crossed and then sweeping the floor with a broom    Pertinent History DCIS in right breast with bilateral  mastectomy October 26, 2195 with 2 nodes removed.  Hx DCIS in 2004 with lumpectomy with no lymph nodes removed and 32 radiation treatments Developed redness swelling and pain in right chest with 3 tubes for 5 weeks and had antibiotic for infection in right chest  and developed problems rasising left arm and found to have a seroma on the left and had surgery for seroma on 12/13/2015 and still has an open wound Past history includes pinched nerves in neck and low back. she thinks she has something in her hip because she has pain when she walks a long distnace.     Patient Stated Goals wants to get back as close as possible    Currently in Pain? Yes   Pain Score 5    Pain Location Groin   Pain  Orientation Right   Pain Descriptors / Indicators Sharp   Pain Type Acute pain   Pain Onset Today            [facility] PT Assessment - 01/12/16 0001      AROM   Left Shoulder Flexion 163 Degrees  less pulling    Left Shoulder ABduction 144 Degrees  hurts around the wound   Left Shoulder External Rotation 90 Degrees                     [facility] Adult PT Treatment/Exercise - 01/12/16 0001      Knee/Hip Exercises: Stretches   Hip Flexor Stretch Right;2 reps;20 seconds     Manual Therapy   Manual Lymphatic Drainage (MLD) In Supine: Short neck, superficial and deep abdominals, Lt inguinal nodes, Lt modified anterior upper trunk and Lt axillo-inguinal anastomosis and Lt upper arm.    Passive ROM to both shoulders in supine in flexion, abduction and external rotation                 PT Education - 01/11/16 1303    Education provided Yes   Education Details neck range of motion and dowel rod flexion, diaphragmatic breathing and stationary circles to left anterior shoulder    Person(s) Educated Patient   Methods Explanation;Demonstration   Comprehension Verbalized understanding;Returned demonstration                [facility] Goals - 01/12/16 1450      CC Long Term Goal  #1   Title pt will be independent in self manual lymph drainage for left upper chest    Status On-going     CC Long Term Goal  #2   Title Pt will be indendent in home program for neck and shoulder range of motion  so that she can reach off a high shelf without pain    Status On-going     CC Long Term Goal  #3   Title pt will report she can do a progressive generalized exercise program at home so that she will be able to care for her grandson without difficulty    Status On-going            Plan - 01/12/16 1740    Clinical Impression Statement Pt reports she is doing lymphatic flow yoga series withouth difficulty.  she experience right groin pain after sitting with legs  crossed and sweepin, but felt some relief with hip flexor stretch.  Improved in shoudler range of motion and ready to upgrade to strengthening next visit    Rehab Potential Good   Clinical Impairments Affecting Rehab Potential delayed healing of  seroma surgery on left chest    PT Next Visit Plan teach supine scapular series ,continue with ROM and MLD as needed    Consulted and Agree with Plan of Care Patient      Patient will benefit from skilled therapeutic intervention in order to improve the following deficits and impairments:  Decreased range of motion, Obesity, Impaired UE functional use, Decreased knowledge of precautions, Decreased skin integrity, Impaired perceived functional ability, Pain, Decreased knowledge of use of DME, Decreased strength, Increased edema  Visit Diagnosis: Stiffness of left shoulder, not elsewhere classified  Localized swelling, mass and lump, trunk  Muscle weakness (generalized)     Problem List Patient Active Problem List   Diagnosis Date Noted  . Induration, breast   . Left shoulder pain   . Abscess 12/09/2015  . Cancer of central portion of right breast ([place]) 10/26/2015  . Genetic testing 10/17/2015  . History of breast cancer in female 09/15/2015  . Breast cancer of upper-outer quadrant of right female breast ([place]) 09/15/2015  . Family history of breast cancer in female 09/15/2015  . Family history of colon cancer 09/15/2015  . TB lung, latent 01/11/2015  . COPD (chronic obstructive pulmonary disease) ([place]) 10/07/2014  . Mediastinal lymphadenopathy 10/07/2014  . Diabetes mellitus type 2, controlled ([place]) 09/29/2014  . Pulmonary nodule 09/29/2014  . History of chest pain 09/29/2014  . Essential hypertension 09/29/2014  . Chest pain 09/23/2014  . Hypothyroidism 02/14/2014  . Tobacco abuse-unspec 10/12/2013  . Hip pain, right 10/12/2013  . Cervical cancer screening 03/09/2013  . Seasonal allergies 09/12/2012  . Nasal polyp 08/30/2012  .  SOM (serous otitis media) 08/30/2012  . Dysuria 07/20/2012  . Peripheral neuropathy ([place]) 07/19/2012  . Cervical myofascial pain syndrome 06/08/2012  . Anemia 01/28/2012  . HTN (hypertension) 01/28/2012  . Hyperlipidemia 01/28/2012  . Hyperglycemia 01/28/2012  . Preventative health care 01/28/2012  . Pain of left heel 01/28/2012  . Neck pain 12/30/2011  . Muscle spasm 12/30/2011  . Anxiety and depression   . Osteoarthritis   . GERD (gastroesophageal reflux disease)   . Fibromyalgia   . Sleep apnea   . Obesity   . History of blood clots   . Fatty liver disease, nonalcoholic   . DDD (degenerative disc disease)    Donato Heinz. Owens Shark PT  [place] Levo 01/12/2016, 5:44 PM  [place], [place], [address] Phone: [phone]   Fax:  [phone]  Name: Susan [name] MRN: [id number] Date of Birth: May 29, 1950  PHYSICAL THERAPY DISCHARGE SUMMARY    Visits from Start of Care: 4 On 01/19/2016, pt called to say that she needed to cancel all further PT appoints as she needs to care for her sick brother.  Current functional level related to goals / functional outcomes: Pt has improved shoulder range of motion and is doing exercise at home.    Remaining deficits: Fullness at bilateral anterior axilla L>R decreased general strength   Education / Equipment: Home exercise program for shoulder range of motion, lymphedema risk reduction education  Plan: Patient agrees to discharge.  Patient goals were not met. Patient is being discharged due to the patient's request.  ?????    Donato Heinz. Owens Shark, PT

## 2016-01-14 ENCOUNTER — Other Ambulatory Visit: Payer: Self-pay | Admitting: *Deleted

## 2016-01-14 DIAGNOSIS — I10 Essential (primary) hypertension: Secondary | ICD-10-CM

## 2016-01-14 NOTE — Telephone Encounter (Signed)
This was sent on 01/04/16 I have resent today

## 2016-01-19 ENCOUNTER — Ambulatory Visit: Payer: 59 | Admitting: Physical Therapy

## 2016-01-24 ENCOUNTER — Ambulatory Visit: Payer: 59 | Admitting: Physical Therapy

## 2016-01-25 ENCOUNTER — Encounter: Payer: Self-pay | Admitting: Family Medicine

## 2016-01-25 ENCOUNTER — Ambulatory Visit (INDEPENDENT_AMBULATORY_CARE_PROVIDER_SITE_OTHER): Payer: 59 | Admitting: Family Medicine

## 2016-01-25 VITALS — BP 122/80 | HR 73 | Temp 98.3°F | Resp 16 | Ht 64.0 in | Wt 230.8 lb

## 2016-01-25 DIAGNOSIS — J449 Chronic obstructive pulmonary disease, unspecified: Secondary | ICD-10-CM | POA: Diagnosis not present

## 2016-01-25 DIAGNOSIS — K760 Fatty (change of) liver, not elsewhere classified: Secondary | ICD-10-CM

## 2016-01-25 DIAGNOSIS — E039 Hypothyroidism, unspecified: Secondary | ICD-10-CM

## 2016-01-25 DIAGNOSIS — E119 Type 2 diabetes mellitus without complications: Secondary | ICD-10-CM

## 2016-01-25 DIAGNOSIS — I10 Essential (primary) hypertension: Secondary | ICD-10-CM

## 2016-01-25 DIAGNOSIS — Z113 Encounter for screening for infections with a predominantly sexual mode of transmission: Secondary | ICD-10-CM

## 2016-01-25 DIAGNOSIS — Z23 Encounter for immunization: Secondary | ICD-10-CM

## 2016-01-25 DIAGNOSIS — C50411 Malignant neoplasm of upper-outer quadrant of right female breast: Secondary | ICD-10-CM | POA: Diagnosis not present

## 2016-01-25 DIAGNOSIS — M797 Fibromyalgia: Secondary | ICD-10-CM

## 2016-01-25 DIAGNOSIS — E785 Hyperlipidemia, unspecified: Secondary | ICD-10-CM

## 2016-01-25 DIAGNOSIS — G4730 Sleep apnea, unspecified: Secondary | ICD-10-CM

## 2016-01-25 DIAGNOSIS — G63 Polyneuropathy in diseases classified elsewhere: Secondary | ICD-10-CM

## 2016-01-25 LAB — COMPREHENSIVE METABOLIC PANEL
ALK PHOS: 53 U/L (ref 33–130)
ALT: 44 U/L — AB (ref 6–29)
AST: 27 U/L (ref 10–35)
Albumin: 4.6 g/dL (ref 3.6–5.1)
BUN: 20 mg/dL (ref 7–25)
CALCIUM: 10.2 mg/dL (ref 8.6–10.4)
CO2: 26 mmol/L (ref 20–31)
Chloride: 101 mmol/L (ref 98–110)
Creat: 0.91 mg/dL (ref 0.50–0.99)
GLUCOSE: 110 mg/dL — AB (ref 65–99)
POTASSIUM: 4 mmol/L (ref 3.5–5.3)
Sodium: 138 mmol/L (ref 135–146)
Total Bilirubin: 0.5 mg/dL (ref 0.2–1.2)
Total Protein: 7.2 g/dL (ref 6.1–8.1)

## 2016-01-25 LAB — MICROALBUMIN / CREATININE URINE RATIO
CREATININE, URINE: 34 mg/dL (ref 20–320)
MICROALB UR: 0.2 mg/dL
Microalb Creat Ratio: 6 mcg/mg creat (ref <30)

## 2016-01-25 LAB — TSH: TSH: 1.32 mIU/L

## 2016-01-25 LAB — HIV ANTIBODY (ROUTINE TESTING W REFLEX): HIV 1&2 Ab, 4th Generation: NONREACTIVE

## 2016-01-25 LAB — POCT GLYCOSYLATED HEMOGLOBIN (HGB A1C): HEMOGLOBIN A1C: 6.1

## 2016-01-25 MED ORDER — TEMAZEPAM 15 MG PO CAPS: 15.0000 mg | ORAL_CAPSULE | Freq: Every evening | ORAL | 3 refills | Status: DC | PRN

## 2016-01-25 MED ORDER — CYCLOBENZAPRINE HCL 10 MG PO TABS: 10.0000 mg | ORAL_TABLET | Freq: Three times a day (TID) | ORAL | 3 refills | Status: DC | PRN

## 2016-01-25 MED ORDER — HYDROCHLOROTHIAZIDE 25 MG PO TABS: ORAL_TABLET | ORAL | 3 refills | Status: DC

## 2016-01-25 MED ORDER — HYDROCODONE-ACETAMINOPHEN 5-325 MG PO TABS: 1.0000 | ORAL_TABLET | Freq: Four times a day (QID) | ORAL | 0 refills | Status: DC | PRN

## 2016-01-25 NOTE — Progress Notes (Signed)
Subjective:    Patient ID: Susan [name], female    DOB: [birth year], 65 y.o.   MRN: [id number] Chief Complaint  Patient presents with  . Follow-up    6 month follow up    HPI  Susan [name] is a 65 yo woman here for a 6 mo follow-up on her chronic medical conditions.  11/29/15 Breast CA: Underwent Right total mastectomy 10/26/15 - 3 mos prior by Dr. Dalbert Batman. Had a aveyr complicated post-op course including numerous episodes of cellulitis and prolonged drainage of surg site.  She had to be hospitalized for IV abx and I&D surgical site 8/8 -> 6 wks prior.  Still has healing open wound from seroma surgery which is healing beautifully.  DM with peripheral neuropathy: Not checkig cbgs.  On metformin 500 bid - tolerating well. A1c 4 mos prior 6-> 6.1 today. Trying to decease carbs in diet.  Optho exam 05/2015.  Not taking asa 81mg  - was not aware she should but will start.  Feet doing great.  Got flu sht today   HTN: Has started checking outside office- still doesn't have home cuff. On hctz 25mg  and metoprolol 50 bid  HPL and fatty liver: Mildly elev LFTs sev mos prior and resolved with diet changes. Lipids 09/24/14 - 4 mos piror - were at goal with LDL 75 and non-HDL of 103.  On  Fenofibrate 160 mg qhs.  She is fasting today  Hypothyroid: TSH at goal 0.84 6 mos prior on levothyroxine 25 mcg.  OSA: Using cpap reg.  Uses temazepam 7.5 qhs prn for sleep but it has stopped working  Chronic pain with Fibromyalgia and DDD:  Chest CTA 09/2014 did show and increased in Rt hilar and mediastinal nodes since 2008 which the radiologist thought might reflect an underlying chronic systemic or inflammatory process.  COPD with H/o tob use - quit 10/25/15. H/o latent TB. Had pulmonary nodule      Her brother has MDS and he is not getting a stem cell transplant [name] she is grieving and she is sleeping better but is concerned that things might flaire.  She is trying to stay busy. He lives in [place] but often  can't see people because he doesn't have an immune system.   Past Medical History:  Diagnosis Date  . Anemia 01/28/2012  . Anxiety   . Cancer [facility]) 2004   ductal carcinoma; lumpectomy  . Cellulitis of chest wall 12/09/2015  . Complication of anesthesia    difficulty voiding after anesthesia  . COPD (chronic obstructive pulmonary disease) (HCC)    mild  . DDD (degenerative disc disease)   . Depression   . Diabetes mellitus without complication ([place])   . DJD (degenerative joint disease)   . Dysrhythmia    hx palpitations?panic attack  . Fatty liver disease, nonalcoholic   . Fibromyalgia   . GERD (gastroesophageal reflux disease)   . Hip pain, right 10/12/2013  . History of blood clots 2004   during cancer treatment  . HTN (hypertension) 01/28/2012  . Hyperglycemia 01/28/2012  . Hyperlipidemia 01/28/2012  . Hypertension   . Hypothyroidism 02/14/2014  . Muscle spasm 12/30/2011  . Nasal polyp 08/30/2012  . Obesity   . Osteoarthritis   . Osteopenia   . Pain of left heel 01/28/2012  . Peripheral neuropathy ([place])   . Peripheral vascular disease ([place]) 04   axillary,upper chest after 3 weeks on tamoxifen  . Pneumonia    hx  . Preventative health care 01/28/2012  .  Shortness of breath dyspnea    occ on exersion  . Sleep apnea    on CPAP  . Tobacco abuse-unspec 10/12/2013  . Tuberculosis    latent  finishing 9 months tx on last month   Past Surgical History:  Procedure Laterality Date  . ABDOMINAL HYSTERECTOMY  1991  . APPENDECTOMY  91  . BILATERAL OOPHORECTOMY  01/2003  . BREAST SURGERY Left 2004   lumpectomy  . CARPAL TUNNEL RELEASE     right  . INCISION AND DRAINAGE ABSCESS Left 12/13/2015   Procedure: DRAINAGE LEFT MASTECTOMY WOUND INFECTION;  Surgeon: Fanny Skates, MD;  Location: [place];  Service: General;  Laterality: Left;  . JOINT REPLACEMENT     right total knee  . MASTECTOMY W/ SENTINEL NODE BIOPSY Right 10/26/2015  . MASTECTOMY W/ SENTINEL NODE BIOPSY Bilateral  10/26/2015   Procedure: RIGHT TOTAL MASTECTOMY WITH RIGHT SENTINEL LYMPH NODE BIOPSY, INJECT BLUE DYE RIGHT BREAST, LEFT BREAST PROPHYLACTIC MASTECTOMY;  Surgeon: Fanny Skates, MD;  Location: [place];  Service: General;  Laterality: Bilateral;  . NASAL SEPTUM SURGERY  08  . PILONIDAL CYST EXCISION    . THYROIDECTOMY, PARTIAL  mid 80's   right side   Current Outpatient Prescriptions on File Prior to Visit  Medication Sig Dispense Refill  . albuterol (PROVENTIL HFA;VENTOLIN HFA) 108 (90 BASE) MCG/ACT inhaler Inhale 2 puffs into the lungs every 4 (four) hours as needed for wheezing or shortness of breath (cough, shortness of breath or wheezing.). 1 Inhaler 1  . Ascorbic Acid (VITAMIN C PO) Take 1 tablet by mouth 3 (three) times daily.    . cholecalciferol (VITAMIN D) 1000 UNITS tablet Take 1,000 Units by mouth daily.    . DULoxetine (CYMBALTA) 30 MG capsule Take 3 capsules (90 mg total) by mouth daily. 270 capsule 1  . esomeprazole (NEXIUM) 20 MG capsule Take 20 mg by mouth daily at 12 noon.    . fenofibrate 160 MG tablet Take 1 tablet (160 mg total) by mouth daily. 90 tablet 3  . ibuprofen (ADVIL,MOTRIN) 200 MG tablet Take 800 mg by mouth at bedtime.    Marland Kitchen levothyroxine (SYNTHROID, LEVOTHROID) 25 MCG tablet Take 1 tablet (25 mcg total) by mouth daily. 90 tablet 1  . metFORMIN (GLUCOPHAGE) 500 MG tablet Take 1 tablet (500 mg total) by mouth 2 (two) times daily with a meal. 180 tablet 1  . metoprolol (LOPRESSOR) 50 MG tablet TAKE 1 TABLET (50 MG TOTAL) BY MOUTH 2 (TWO) TIMES DAILY. 180 tablet 1  . Multiple Vitamins-Minerals (MULTIVITAMIN WITH MINERALS) tablet Take 1 tablet by mouth daily.     No current facility-administered medications on file prior to visit.    Allergies  Allergen Reactions  . Chantix [Varenicline] Other (See Comments)    "Felt like having mental breakdown"  . Ciprofloxacin Other (See Comments)    Muscle tightness, tendon aching and pain  . Penicillins Anaphylaxis and  Swelling  . Tamoxifen Other (See Comments)    Blood clots  . Levaquin [Levofloxacin] Other (See Comments)    Severe tendon pain  . Lyrica [Pregabalin] Swelling  . Neurontin [Gabapentin] Swelling  . Morphine And Related Other (See Comments)    MORPHINE DRIP - causes pt to feel irritable and itch  . Ceclor [Cefaclor] Rash  . Other Other (See Comments)    Redness from tape and bandaides  . Sulfa Antibiotics Rash   Family History  Problem Relation Age of Onset  . Cancer Mother 54    liver  cancer, hep c  . Heart disease Mother     chf  . Hypertension Mother   . Hyperlipidemia Mother   . Osteoporosis Mother   . Cancer Father 69    lung; smoker  . COPD Father   . Heart disease Sister     mvp  . Breast cancer Sister     dx. 43s  . Non-Hodgkin's lymphoma Sister 85    large B cell  . Hypertension Brother   . Arthritis Brother   . Kidney disease Brother   . Prostate cancer Brother     dx. early 58s  . Other Daughter     Beal's Syndrome  . Arthritis Daughter     Beal's  . Arthritis Son     Beal's connective tissue disease  . Vision loss Maternal Grandmother   . Dementia Maternal Grandmother   . Coronary artery disease Maternal Grandmother   . Heart Problems Maternal Grandmother   . Vision loss Paternal Grandfather   . Hypertension Brother   . Benign prostatic hyperplasia Brother   . Leukemia Brother     chronic lymphatic leukemia - small/B cell  . Hypertension Brother   . COPD Brother   . Prostate cancer Brother     dx. mid-60s  . Myelodysplastic syndrome Brother     dx. early 47s  . Hypertension Brother   . Hyperlipidemia Brother   . Prostate cancer Brother     dx. mid-70s  . Melanoma Brother     (x2) melanomas dx. late 60s-early 62s  . Mental illness Brother     schizophrenia  . Breast cancer Brother     dx. 60s  . Cirrhosis Brother     hep c  . Hypertension Sister   . Other Sister     thalessemia, anemia; hx of hysterectomy in her 1s  . Hyperlipidemia  Sister   . Gout Sister   . Diverticulitis Sister   . Other Daughter     overweight  . Breast cancer Maternal Aunt 47  . Lung cancer Cousin     maternal 1st cousin dx. 41 or younger; former smoker  . Colon cancer Cousin     maternal 1st cousin dx. late 36s-early 60s  . Cancer Cousin     maternal 1st cousin d. NOS cancer  . Emphysema Paternal Aunt   . Lung cancer Paternal Aunt     d. early 75s; smoker  . Leukemia Cousin     paternal 1st cousin d. early 35s  . Colon cancer Other 18    niece  . Melanoma Other     niece   Social History   Social History  . Marital status: Married    Spouse name: N/A  . Number of children: N/A  . Years of education: N/A   Social History Main Topics  . Smoking status: Former Smoker    Packs/day: 0.50    Years: 42.00    Types: Cigarettes    Quit date: 10/30/2015  . Smokeless tobacco: Never Used  . Alcohol use No  . Drug use: No  . Sexual activity: No   Other Topics Concern  . None   Social History Narrative  . None    Review of Systems See hpi    Objective:   Physical Exam  Constitutional: She is oriented to person, place, and time. She appears well-developed and well-nourished. No distress.  HENT:  Head: Normocephalic and atraumatic.  Right Ear: Tympanic membrane, external ear and ear canal  normal.  Left Ear: Tympanic membrane, external ear and ear canal normal.  Nose: Nose normal. No mucosal edema or rhinorrhea.  Mouth/Throat: Uvula is midline, oropharynx is clear and moist and mucous membranes are normal. No oropharyngeal exudate.  Eyes: Conjunctivae are normal. Right eye exhibits no discharge. Left eye exhibits no discharge. No scleral icterus.  Neck: Normal range of motion. Neck supple. No thyromegaly present.  Cardiovascular: Normal rate, regular rhythm, normal heart sounds and intact distal pulses.   Pulmonary/Chest: Effort normal and breath sounds normal. No respiratory distress.  Musculoskeletal: She exhibits no edema.    Lymphadenopathy:    She has no cervical adenopathy.  Neurological: She is alert and oriented to person, place, and time.  Skin: Skin is warm and dry. She is not diaphoretic. No erythema.  Psychiatric: She has a normal mood and affect. Her behavior is normal.   Normal foot exam, less sensatoin on right and on bilateral heal  BP 122/80 (BP Location: Left Arm, Patient Position: Sitting, Cuff Size: Large)   Pulse 73   Temp 98.3 F (36.8 C) (Oral)   Resp 16   Ht 5\' 4"  (1.626 m)   Wt 230 lb 12.8 oz (104.7 kg)   SpO2 97%   BMI 39.62 kg/m \     Assessment & Plan:  H/o tob use quit 3 mos piror - would qualify for screening lung CT - Had CTZ 09/2014 - has appt with Dr. Lamonte Sakai next wk to f/u lymphadenoatphy (from latent tb?) and expecting to get repeat CT and will likely need reg A1c, cmp, urine microalb, hiv, tsh - ordered Needs Foot exam - ordered Needs flu shot - today  Refill temazpeam - ok to increase, uses prn.   Will need refill on her meds in Nov and Jan - ok to refill all for 6 months whenever we receive pharmacy request. Refill metformin, cymbalta, and levothyroxine all at same dose x 1 year whenever pharm request is received.  1. Breast cancer of upper-outer quadrant of right female breast ([place])   2. Controlled type 2 diabetes mellitus without complication, without long-term current use of insulin ([place])   3. Essential hypertension   4. Hyperlipidemia   5. Fatty liver disease, nonalcoholic   6. Hypothyroidism, unspecified hypothyroidism type   7. Sleep apnea   8. Fibromyalgia   9. Polyneuropathy associated with underlying disease ([place])   10. Chronic obstructive pulmonary disease, unspecified COPD type ([place])   11. Screening for STD (sexually transmitted disease)   12. Need for prophylactic vaccination and inoculation against influenza     Orders Placed This Encounter  Procedures  . Flu Vaccine QUAD 36+ mos IM  . Comprehensive metabolic panel  . TSH  . HIV antibody  .  Microalbumin/Creatinine Ratio, Urine  . POCT glycosylated hemoglobin (Hb A1C)  . HM DIABETES FOOT EXAM    Meds ordered this encounter  Medications  . temazepam (RESTORIL) 15 MG capsule    Sig: Take 1 capsule (15 mg total) by mouth at bedtime as needed for sleep.    Dispense:  30 capsule    Refill:  3  . hydrochlorothiazide (HYDRODIURIL) 25 MG tablet    Sig: TAKE 1 TABLET (25 MG TOTAL) BY MOUTH DAILY.    Dispense:  90 tablet    Refill:  3  . cyclobenzaprine (FLEXERIL) 10 MG tablet    Sig: Take 1 tablet (10 mg total) by mouth 3 (three) times daily as needed for muscle spasms.    Dispense:  90 tablet    Refill:  3  . HYDROcodone-acetaminophen (NORCO/VICODIN) 5-325 MG tablet    Sig: Take 1 tablet by mouth every 6 (six) hours as needed for severe pain.    Dispense:  20 tablet    Refill:  0    [name], M.D.  [facility] [address] [place], [place] [address] [phone] phone [phone] fax  02/06/16 9:09 AM  Results for orders placed or performed in visit on 01/25/16  Comprehensive metabolic panel  Result Value Ref Range   Sodium 138 135 - 146 mmol/L   Potassium 4.0 3.5 - 5.3 mmol/L   Chloride 101 98 - 110 mmol/L   CO2 26 20 - 31 mmol/L   Glucose, Bld 110 (H) 65 - 99 mg/dL   BUN 20 7 - 25 mg/dL   Creat 0.91 0.50 - 0.99 mg/dL   Total Bilirubin 0.5 0.2 - 1.2 mg/dL   Alkaline Phosphatase 53 33 - 130 U/L   AST 27 10 - 35 U/L   ALT 44 (H) 6 - 29 U/L   Total Protein 7.2 6.1 - 8.1 g/dL   Albumin 4.6 3.6 - 5.1 g/dL   Calcium 10.2 8.6 - 10.4 mg/dL  TSH  Result Value Ref Range   TSH 1.32 mIU/L  HIV antibody  Result Value Ref Range   HIV 1&2 Ab, 4th Generation NONREACTIVE NONREACTIVE  Microalbumin/Creatinine Ratio, Urine  Result Value Ref Range   Creatinine, Urine 34 20 - 320 mg/dL   Microalb, Ur 0.2 Not estab mg/dL   Microalb Creat Ratio 6 <30 mcg/mg creat  POCT glycosylated hemoglobin (Hb A1C)  Result Value Ref Range    Hemoglobin A1C 6.1

## 2016-01-25 NOTE — Patient Instructions (Addendum)
IF you received an x-ray today, you will receive an invoice from [facility]. Please contact [facility] at [phone] with questions or concerns regarding your invoice.   IF you received labwork today, you will receive an invoice from [employer]. Please contact [facility] at [phone] with questions or concerns regarding your invoice.   Our billing staff will not be able to assist you with questions regarding bills from these companies.  You will be contacted with the lab results as soon as they are available. The fastest way to get your results is to activate your My Chart account. Instructions are located on the last page of this paperwork. If you have not heard from [place] regarding the results in 2 weeks, please contact this office.         IF you received an x-ray today, you will receive an invoice from [facility]. Please contact [facility] at [phone] with questions or concerns regarding your invoice.   IF you received labwork today, you will receive an invoice from [employer]. Please contact [facility] at [phone] with questions or concerns regarding your invoice.   Our billing staff will not be able to assist you with questions regarding bills from these companies.  You will be contacted with the lab results as soon as they are available. The fastest way to get your results is to activate your My Chart account. Instructions are located on the last page of this paperwork. If you have not heard from [place] regarding the results in 2 weeks, please contact this office.    [place] is now offering annual lung cancer screening by low-dose CT scan.  This is covered for qualifying patients and your insurance will be checked before the procedure.  Call the lung cancer screening nurse navigators at [phone] to learn more about this and get scheduled.   Diabetes and Foot Care Diabetes  may cause you to have problems because of poor blood supply (circulation) to your feet and legs. This may cause the skin on your feet to become thinner, break easier, and heal more slowly. Your skin may become dry, and the skin may peel and crack. You may also have nerve damage in your legs and feet causing decreased feeling in them. You may not notice minor injuries to your feet that could lead to infections or more serious problems. Taking care of your feet is one of the most important things you can do for yourself.  HOME CARE INSTRUCTIONS  Wear shoes at all times, even in the house. Do not go barefoot. Bare feet are easily injured.  Check your feet daily for blisters, cuts, and redness. If you cannot see the bottom of your feet, use a mirror or ask someone for help.  Wash your feet with warm water (do not use hot water) and mild soap. Then pat your feet and the areas between your toes until they are completely dry. Do not soak your feet as this can dry your skin.  Apply a moisturizing lotion or petroleum jelly (that does not contain alcohol and is unscented) to the skin on your feet and to dry, brittle toenails. Do not apply lotion between your toes.  Trim your toenails straight across. Do not dig under them or around the cuticle. File the edges of your nails with an emery board or nail file.  Do not cut corns or calluses or try to remove them with medicine.  Wear clean socks or  stockings every day. Make sure they are not too tight. Do not wear knee-high stockings since they may decrease blood flow to your legs.  Wear shoes that fit properly and have enough cushioning. To break in new shoes, wear them for just a few hours a day. This prevents you from injuring your feet. Always look in your shoes before you put them on to be sure there are no objects inside.  Do not cross your legs. This may decrease the blood flow to your feet.  If you find a minor scrape, cut, or break in the skin on your  feet, keep it and the skin around it clean and dry. These areas may be cleansed with mild soap and water. Do not cleanse the area with peroxide, alcohol, or iodine.  When you remove an adhesive bandage, be sure not to damage the skin around it.  If you have a wound, look at it several times a day to make sure it is healing.  Do not use heating pads or hot water bottles. They may burn your skin. If you have lost feeling in your feet or legs, you may not know it is happening until it is too late.  Make sure your health care provider performs a complete foot exam at least annually or more often if you have foot problems. Report any cuts, sores, or bruises to your health care provider immediately. SEEK MEDICAL CARE IF:   You have an injury that is not healing.  You have cuts or breaks in the skin.  You have an ingrown nail.  You notice redness on your legs or feet.  You feel burning or tingling in your legs or feet.  You have pain or cramps in your legs and feet.  Your legs or feet are numb.  Your feet always feel cold. SEEK IMMEDIATE MEDICAL CARE IF:   There is increasing redness, swelling, or pain in or around a wound.  There is a red line that goes up your leg.  Pus is coming from a wound.  You develop a fever or as directed by your health care provider.  You notice a bad smell coming from an ulcer or wound.   This information is not intended to replace advice given to you by your health care provider. Make sure you discuss any questions you have with your health care provider.   Document Released: 04/20/2000 Document Revised: 12/24/2012 Document Reviewed: 09/30/2012 Elsevier Interactive Patient Education [employer].

## 2016-01-26 ENCOUNTER — Ambulatory Visit: Payer: 59 | Admitting: Physical Therapy

## 2016-01-26 ENCOUNTER — Ambulatory Visit: Payer: 59 | Admitting: Family Medicine

## 2016-01-31 ENCOUNTER — Ambulatory Visit (INDEPENDENT_AMBULATORY_CARE_PROVIDER_SITE_OTHER): Payer: 59 | Admitting: Emergency Medicine

## 2016-01-31 ENCOUNTER — Encounter: Payer: Self-pay | Admitting: Emergency Medicine

## 2016-01-31 ENCOUNTER — Encounter: Payer: 59 | Admitting: Physical Therapy

## 2016-01-31 VITALS — BP 120/68 | HR 75 | Ht 64.0 in | Wt 232.0 lb

## 2016-01-31 DIAGNOSIS — R591 Generalized enlarged lymph nodes: Secondary | ICD-10-CM | POA: Diagnosis not present

## 2016-01-31 NOTE — Assessment & Plan Note (Signed)
Mild disease, stable symptoms. Continue albuterol prn. Flu shot UTD

## 2016-01-31 NOTE — Progress Notes (Signed)
Subjective:    Patient ID: Susan [name], female    DOB: 1951-03-31, 65 y.o.   MRN: [id number]  HPI 65 year old smoker (40 pack years) with a history of diabetes, left ductal carcinoma in situ treated with lumpectomy and tamoxifen, c/b VTE. Obstructive sleep apnea on CPAP, hypertension. I have reviewed the office notes from Dr. Lorelei Pont. She carries a history of asthma made in the last couple years when she had apparent flares. Has been treated with pred and abx on those occasions. Also started on Advair - stopped it about 10 days ago. She has a daily cough w gray phlegm - better w decreasing. She can exert indefinitely without SOB, works in her yard.   She underwent a CT scan of the chest on 09/23/14 in order to evaluate an episode of chest tightness and dyspnea, tachycardia that seemed to be best relieved by ativan. This study was reviewed by me today. It does not show any evidence of pulmonary embolism or infiltrate. I do not see any peripheral nodular disease. She does have some bilateral hilar and subcarinal lymphadenopathy. On comparison to her prior CT chest from 2008 the nodes are slightly larger. She does have a family hx of B cell lymphoma.  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 She has lived in [place], including [place].   [place] 11/26/14 -- follow-up visit for suspected asthma and a history of lymphadenopathy that was first done fine in 2008 and then re-characterized on CT scan of the chest from 09/23/14. She underwent full pulmonary function testing today which I have reviewed personally. This shows normal airflows without a bronchodilator response, possible restriction as identified by a mildly reduced residual volume and a normal diffusion capacity. Last time we empirically stopped Advair. She has decreased her cigarettes significantly, now at 3-4 cig a day. We discussed tools and techniques today to stop, to help with her quit date. She uses albuterol sometimes to relieve cough  and clear mucous.    ROV 65/6/16 -- follow up visit for hx LAD on CT chest, tobacco use without obstructive lung disease on PFT. She has not missed her Advair. She had a URI about 5 weeks ago, left her with residual cough. She has backtracked on cigarettes - back to 8-10 a day.   ROV 65/6/16 -- follow up visit for mild COPD, lymphadenopathy on CT chest  (last was 09/2014). She also has been treated for latent TB with isoniazid. She has albuterol available to use prn, has never needed it. Her exercise tolerance is slightly limited. Smokes 8-10 cig a day. Feels otherwise well. She is using nicotine patches sometimes.  We discussed strategies today. She is not quite ready to set quit date.   ROV 65/19/17 -- patient has a history of continued tobacco abuse, mild COPD, and latent TB that has been treated with isoniazid now approaching 9 months of therapy. She was dx with a second primary ductal CA recently, is scheduled for a double mastectomy this week. Her COPD has been stable - she rarely uses albuterol, about 2-3 puffs a week. Her smoking is much less - 4 cig a day.   ROV 01/31/16 -- This is a follow up visit for mild COPD, hx latent TB treated 9 months. She underwent mastectomy for breast CA since last time, had to deal w some infection, a seroma. Has been doing well since w regard to her breathing. Having some allergy and rhinitis lately. Has not needed albuterol since  before her surgery.  She had the flu shot last week.    Review of Systems  Constitutional: Negative for fever and unexpected weight change.  HENT: Negative for congestion, dental problem, ear pain, nosebleeds, postnasal drip, rhinorrhea, sinus pressure, sneezing, sore throat and trouble swallowing.   Eyes: Negative for redness and itching.  Respiratory: Positive for cough. Negative for chest tightness, shortness of breath and wheezing.   Cardiovascular: Negative for palpitations and leg swelling.  Gastrointestinal: Negative for nausea  and vomiting.  Genitourinary: Negative for dysuria.  Musculoskeletal: Negative for joint swelling.  Skin: Negative for rash.  Neurological: Negative for headaches.  Hematological: Does not bruise/bleed easily.  Psychiatric/Behavioral: Negative for dysphoric mood. The patient is not nervous/anxious.   She has worked as a Marine scientist.       Objective:   Physical Exam Vitals:   01/31/16 1005  BP: 120/68  Pulse: 75  SpO2: 95%  Weight: 232 lb (105.2 kg)  Height: 5\' 4"  (1.626 m)   Gen: Pleasant, overwt, in no distress,  normal affect  ENT: No lesions,  mouth clear,  oropharynx clear, no postnasal drip  Neck: No JVD, no TMG, no carotid bruits  Lungs: No use of accessory muscles, clear without rales or rhonchi  Cardiovascular: RRR, heart sounds normal, no murmur or gallops, no peripheral edema  Musculoskeletal: No deformities, no cyanosis or clubbing  Neuro: alert, non focal  Skin: Warm, no lesions or rashes     Assessment & Plan:  COPD (chronic obstructive pulmonary disease) (HCC) Mild disease, stable symptoms. Continue albuterol prn. Flu shot UTD  Mediastinal lymphadenopathy Repeat Ct scan chest May 2018, after that (assuming stable) she would qualify for LDCT screening.   Baltazar Apo, MD, PhD 01/31/2016, 10:29 AM [facility] [phone] or if no answer [phone]

## 2016-01-31 NOTE — Assessment & Plan Note (Signed)
Repeat Ct scan chest May 2018, after that (assuming stable) she would qualify for LDCT screening.

## 2016-01-31 NOTE — Patient Instructions (Addendum)
Please keep your albuterol available to use as needed Flu shot up to date We will repeat your CT scan chest in May 2018 to look for interval change in lymph nodes. After that scan we will discuss initiating low-dose CT scan lung cancer screening in the future.  Follow with Dr Lamonte Sakai in may 2018 after the Ct scan to review

## 2016-02-02 ENCOUNTER — Encounter: Payer: 59 | Admitting: Physical Therapy

## 2016-02-06 ENCOUNTER — Encounter: Payer: Self-pay | Admitting: Physical Therapy

## 2016-02-06 ENCOUNTER — Ambulatory Visit: Payer: 59 | Attending: General Surgery | Admitting: Physical Therapy

## 2016-02-06 DIAGNOSIS — M6281 Muscle weakness (generalized): Secondary | ICD-10-CM | POA: Diagnosis present

## 2016-02-06 DIAGNOSIS — I972 Postmastectomy lymphedema syndrome: Secondary | ICD-10-CM | POA: Diagnosis not present

## 2016-02-06 DIAGNOSIS — R222 Localized swelling, mass and lump, trunk: Secondary | ICD-10-CM | POA: Insufficient documentation

## 2016-02-06 DIAGNOSIS — M25612 Stiffness of left shoulder, not elsewhere classified: Secondary | ICD-10-CM | POA: Insufficient documentation

## 2016-02-06 NOTE — Therapy (Signed)
[place], [place], [address] Phone: [phone]   Fax:  [phone]  Physical Therapy Evaluation  Patient Details  Name: Susan [name] MRN: [id number] Date of Birth: [birth year] Referring Provider: Dr. Dalbert Batman  Encounter Date: 02/06/2016      PT End of Session - 02/06/16 1700    Visit Number 1   Number of Visits 16   Date for PT Re-Evaluation 03/12/16   PT Start Time 1601   PT Stop Time 1648   PT Time Calculation (min) 47 min   Activity Tolerance Patient tolerated treatment well   Behavior During Therapy [facility] for tasks assessed/performed      Past Medical History:  Diagnosis Date  . Anemia 01/28/2012  . Anxiety   . Cancer [facility]) 2004   ductal carcinoma; lumpectomy  . Cellulitis of chest wall 12/09/2015  . Complication of anesthesia    difficulty voiding after anesthesia  . COPD (chronic obstructive pulmonary disease) (HCC)    mild  . DDD (degenerative disc disease)   . Depression   . Diabetes mellitus without complication ([place])   . DJD (degenerative joint disease)   . Dysrhythmia    hx palpitations?panic attack  . Fatty liver disease, nonalcoholic   . Fibromyalgia   . GERD (gastroesophageal reflux disease)   . Hip pain, right 10/12/2013  . History of blood clots 2004   during cancer treatment  . HTN (hypertension) 01/28/2012  . Hyperglycemia 01/28/2012  . Hyperlipidemia 01/28/2012  . Hypertension   . Hypothyroidism 02/14/2014  . Muscle spasm 12/30/2011  . Nasal polyp 08/30/2012  . Obesity   . Osteoarthritis   . Osteopenia   . Pain of left heel 01/28/2012  . Peripheral neuropathy ([place])   . Peripheral vascular disease ([place]) 04   axillary,upper chest after 3 weeks on tamoxifen  . Pneumonia    hx  . Preventative health care 01/28/2012  . Shortness of breath dyspnea    occ on exersion  . Sleep apnea    on CPAP  . Tobacco abuse-unspec 10/12/2013  . Tuberculosis    latent  finishing 9  months tx on last month    Past Surgical History:  Procedure Laterality Date  . ABDOMINAL HYSTERECTOMY  1991  . APPENDECTOMY  91  . BILATERAL OOPHORECTOMY  01/2003  . BREAST SURGERY Left 2004   lumpectomy  . CARPAL TUNNEL RELEASE     right  . INCISION AND DRAINAGE ABSCESS Left 12/13/2015   Procedure: DRAINAGE LEFT MASTECTOMY WOUND INFECTION;  Surgeon: Fanny Skates, MD;  Location: [place];  Service: General;  Laterality: Left;  . JOINT REPLACEMENT     right total knee  . MASTECTOMY W/ SENTINEL NODE BIOPSY Right 10/26/2015  . MASTECTOMY W/ SENTINEL NODE BIOPSY Bilateral 10/26/2015   Procedure: RIGHT TOTAL MASTECTOMY WITH RIGHT SENTINEL LYMPH NODE BIOPSY, INJECT BLUE DYE RIGHT BREAST, LEFT BREAST PROPHYLACTIC MASTECTOMY;  Surgeon: Fanny Skates, MD;  Location: [place];  Service: General;  Laterality: Bilateral;  . NASAL SEPTUM SURGERY  08  . PILONIDAL CYST EXCISION    . THYROIDECTOMY, PARTIAL  mid 80's   right side    There were no vitals filed for this visit.       Subjective Assessment - 02/06/16 1614    Subjective Since I was here last I have noticed an increase in my right trunk swelling. I have been wearing my binder religiously. I think it helps some. I want to get this under control. Things  have settled down and I am ready to deal with this now.    Pertinent History DCIS in right breast with bilateral mastectomy October 26, 2195 with 2 nodes removed.  Hx DCIS in 2004 with lumpectomy with no lymph nodes removed and 32 radiation treatments Developed redness swelling and pain in right chest with 3 tubes for 5 weeks and had antibiotic for infection in right chest  and developed problems rasising left arm and found to have a seroma on the left and had surgery for seroma on 12/13/2015 and still has an open wound Past history includes pinched nerves in neck and low back. she thinks she has something in her hip because she has pain when she walks a long distnace.     Patient Stated Goals my hope  is to get this swelling down and to learn how to manage it   Currently in Pain? No/denies   Pain Score 0-No pain            [facility] PT Assessment - 02/06/16 0001      Assessment   Medical Diagnosis breast cancer    Referring Provider Dr. 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  every other week     Leisure loves to be with her family, has a very ill brother, loves to day trip, loves to quilt with machine      Cognition   Overall Cognitive Status Within Functional Limits for tasks assessed     Observation/Other Assessments   Observations dressing over left chest covering gauze filled area on incision line, well healed incision line on right chest    Quick DASH  --     Coordination   Gross Motor Movements are Fluid and Coordinated --     Posture/Postural Control   Posture/Postural Control No significant limitations     AROM   Overall AROM Comments UE strength about 4/5   Right Shoulder Flexion 165 Degrees   Right Shoulder ABduction 170 Degrees   Right Shoulder External Rotation 90 Degrees   Left Shoulder Flexion 169 Degrees  pulling in lateral incision area   Left Shoulder ABduction 176 Degrees  pain    Left Shoulder External Rotation 90 Degrees   Cervical - Right Rotation 45  pain   Cervical - Left Rotation 45  with pain           LYMPHEDEMA/ONCOLOGY QUESTIONNAIRE -  02/06/16 1626      Type   Cancer Type right breast cancer  left breast cancer 2004     Surgeries   Mastectomy Date 10/26/15   Sentinel Lymph Node Biopsy Date 10/26/15  on right   Number Lymph Nodes Removed 2     Date Lymphedema/Swelling Started   Date 12/20/15  worsened on right side during the last week     Treatment   Active Chemotherapy Treatment  No   Past Chemotherapy Treatment No   Active Radiation Treatment No   Past Radiation Treatment No   Current Hormone Treatment No   Past Hormone Therapy No     What other symptoms do you have   Are you Having Heaviness or Tightness Yes   Are you having Pain Yes   Are you having pitting edema No   Is it Hard or Difficult finding clothes that fit No   Do you have infections Yes   Is there Decreased scar mobility Yes     Right Upper Extremity Lymphedema   15 cm Proximal to Olecranon Process 39.4 cm   Olecranon Process 29.5 cm   10 cm Proximal to Ulnar Styloid Process 24.6 cm   Just Proximal to Ulnar Styloid Process 17.1 cm   Across Hand at [employer] 20 cm   At [place] of 2nd Digit 6.1 cm     Left Upper Extremity Lymphedema   15 cm Proximal to Olecranon Process 40.3 cm   Olecranon Process 29.6 cm   10 cm Proximal to Ulnar Styloid Process 26 cm   Just Proximal to Ulnar Styloid Process 17.5 cm   Across Hand at [employer] 19.8 cm   At [place] of 2nd Digit 6 cm                [facility] Adult PT Treatment/Exercise - 02/06/16 0001      Manual Therapy   Manual Therapy Edema management   Manual therapy comments created foam chip bag for pt to wear in her binder, educated pt on obtaining a compression sleeve, educated pt on obtaining commercially available compression shirt                PT Education - 02/06/16 1659    Education provided Yes   Education Details made foam chip bag for pt to wear in bra and educated her about this, also educated her about importance of getting a compression sleeve and t shirt  before she flies on Saturday   Person(s) Educated Patient   Methods Explanation;Handout   Comprehension Verbalized understanding           Short Term Clinic Goals - 02/06/16 1709      CC Short Term Goal  #1   Title Patient will be able to independently verbalize lymphedema risk reduction practices   Time 3   Period Weeks   Status New             Long Term Clinic Goals - 02/06/16 1707      CC Long Term Goal  #1   Title pt will be independent in self manual lymph drainage for left upper chest    Time 5   Period Weeks   Status New     CC Long Term Goal  #2   Title Pt will be indendent in home exercise program for shoulder strengthening   Time 5   Period Weeks   Status New     CC Long Term Goal  #3   Title Pt will obtain appropriate compression garments including a compression t shirt and compression sleeve for long term management of edema   Time 5   Period Weeks   Status New            Plan - 02/06/16 1701    Clinical Impression Statement Pt was reciveving lymphedema services at this clinic a month ago but she was discharged because of personal reasons. She is  now back and eager to get her swelling under control. Her left shoulder ROM has improved since last session. She has edema of her left anterior chest, right anterior chest and right trunk. She has recently noticed worsening of edema on right side and feels it may have been triggered when she lifted something heavy in her garage. She was given information today to obtain a compression sleeve and educated about purchasing a commercially available compression t shirt before flying on Saturday. Pt would benefit from skilled PT services to educate her on self manual lymphatic drainage and assist her with obtaining compression garments for long term management of edema. She will also benefit from pec stretches.    Rehab Potential Good   Clinical Impairments Affecting Rehab Potential delayed healing of seroma  surgery on left chest    PT Frequency 3x / week   PT Duration Other (comment)  5 weeks   PT Treatment/Interventions ADLs/Self Care Home Management;Therapeutic activities;Therapeutic exercise;Taping;Manual lymph drainage;Patient/family education;DME Instruction;Neuromuscular re-education   PT Next Visit Plan educate on lymphedema risk reduction practices - give pink sheet, teach supine scapular series, instruct in self MLD, assess if pt was able to obtain compression shirt and sleeve, assess if chip bag helped swelling   Consulted and Agree with Plan of Care Patient      Patient will benefit from skilled therapeutic intervention in order to improve the following deficits and impairments:  Increased edema, Decreased knowledge of precautions, Pain, Decreased strength  Visit Diagnosis: Postmastectomy lymphedema - Plan: PT plan of care cert/re-cert  Localized swelling, mass and lump, trunk - Plan: PT plan of care cert/re-cert  Muscle weakness (generalized) - Plan: PT plan of care cert/re-cert     Problem List Patient Active Problem List   Diagnosis Date Noted  . Induration, breast   . Left shoulder pain   . Abscess 12/09/2015  . Cancer of central portion of right breast ([place]) 10/26/2015  . Genetic testing 10/17/2015  . History of breast cancer in female 09/15/2015  . Breast cancer of upper-outer quadrant of right female breast ([place]) 09/15/2015  . Family history of breast cancer in female 09/15/2015  . Family history of colon cancer 09/15/2015  . TB lung, latent 01/11/2015  . COPD (chronic obstructive pulmonary disease) ([place]) 10/07/2014  . Mediastinal lymphadenopathy 10/07/2014  . Diabetes mellitus type 2, controlled ([place]) 09/29/2014  . Pulmonary nodule 09/29/2014  . History of chest pain 09/29/2014  . Essential hypertension 09/29/2014  . Chest pain 09/23/2014  . Hypothyroidism 02/14/2014  . Hip pain, right 10/12/2013  . Cervical cancer screening 03/09/2013  . Seasonal  allergies 09/12/2012  . Nasal polyp 08/30/2012  . SOM (serous otitis media) 08/30/2012  . Dysuria 07/20/2012  . Peripheral neuropathy ([place]) 07/19/2012  . Cervical myofascial pain syndrome 06/08/2012  . Anemia 01/28/2012  . Hyperlipidemia 01/28/2012  . Hyperglycemia 01/28/2012  . Preventative health care 01/28/2012  . Pain of left heel 01/28/2012  . Neck pain 12/30/2011  . Muscle spasm 12/30/2011  . Anxiety and depression   . Osteoarthritis   . GERD (gastroesophageal reflux disease)   . Fibromyalgia   . Sleep apnea   . Obesity   . History of blood clots   . Fatty liver disease, nonalcoholic   . DDD (degenerative disc disease)     Alexia Freestone 02/06/2016, 5:15 PM  [place], [place], [address] Phone: [phone]   Fax:  [phone]  Name: Susan [name] MRN:  [id number] Date of Birth: 1951-03-26  Allyson Sabal, PT 02/06/16 5:15 PM

## 2016-02-07 ENCOUNTER — Ambulatory Visit: Payer: 59

## 2016-02-07 DIAGNOSIS — M6281 Muscle weakness (generalized): Secondary | ICD-10-CM

## 2016-02-07 DIAGNOSIS — I972 Postmastectomy lymphedema syndrome: Secondary | ICD-10-CM | POA: Diagnosis not present

## 2016-02-07 DIAGNOSIS — R222 Localized swelling, mass and lump, trunk: Secondary | ICD-10-CM

## 2016-02-07 DIAGNOSIS — M25612 Stiffness of left shoulder, not elsewhere classified: Secondary | ICD-10-CM

## 2016-02-07 NOTE — Therapy (Signed)
[place], [place], [address] Phone: [phone]   Fax:  [phone]  Physical Therapy Treatment  Patient Details  Name: Susan [name] MRN: [id number] Date of Birth: 27-May-1950 Referring Provider: Dr. Dalbert Batman  Encounter Date: 02/07/2016      PT End of Session - 02/07/16 1020    Visit Number 2   Number of Visits 15   Date for PT Re-Evaluation 03/12/16   PT Start Time 0926   PT Stop Time 1020   PT Time Calculation (min) 54 min   Activity Tolerance Patient tolerated treatment well   Behavior During Therapy [facility] for tasks assessed/performed      Past Medical History:  Diagnosis Date  . Anemia 01/28/2012  . Anxiety   . Cancer [facility]) 2004   ductal carcinoma; lumpectomy  . Cellulitis of chest wall 12/09/2015  . Complication of anesthesia    difficulty voiding after anesthesia  . COPD (chronic obstructive pulmonary disease) (HCC)    mild  . DDD (degenerative disc disease)   . Depression   . Diabetes mellitus without complication ([place])   . DJD (degenerative joint disease)   . Dysrhythmia    hx palpitations?panic attack  . Fatty liver disease, nonalcoholic   . Fibromyalgia   . GERD (gastroesophageal reflux disease)   . Hip pain, right 10/12/2013  . History of blood clots 2004   during cancer treatment  . HTN (hypertension) 01/28/2012  . Hyperglycemia 01/28/2012  . Hyperlipidemia 01/28/2012  . Hypertension   . Hypothyroidism 02/14/2014  . Muscle spasm 12/30/2011  . Nasal polyp 08/30/2012  . Obesity   . Osteoarthritis   . Osteopenia   . Pain of left heel 01/28/2012  . Peripheral neuropathy ([place])   . Peripheral vascular disease ([place]) 04   axillary,upper chest after 3 weeks on tamoxifen  . Pneumonia    hx  . Preventative health care 01/28/2012  . Shortness of breath dyspnea    occ on exersion  . Sleep apnea    on CPAP  . Tobacco abuse-unspec 10/12/2013  . Tuberculosis    latent  finishing 9  months tx on last month    Past Surgical History:  Procedure Laterality Date  . ABDOMINAL HYSTERECTOMY  1991  . APPENDECTOMY  91  . BILATERAL OOPHORECTOMY  01/2003  . BREAST SURGERY Left 2004   lumpectomy  . CARPAL TUNNEL RELEASE     right  . INCISION AND DRAINAGE ABSCESS Left 12/13/2015   Procedure: DRAINAGE LEFT MASTECTOMY WOUND INFECTION;  Surgeon: Fanny Skates, MD;  Location: [place];  Service: General;  Laterality: Left;  . JOINT REPLACEMENT     right total knee  . MASTECTOMY W/ SENTINEL NODE BIOPSY Right 10/26/2015  . MASTECTOMY W/ SENTINEL NODE BIOPSY Bilateral 10/26/2015   Procedure: RIGHT TOTAL MASTECTOMY WITH RIGHT SENTINEL LYMPH NODE BIOPSY, INJECT BLUE DYE RIGHT BREAST, LEFT BREAST PROPHYLACTIC MASTECTOMY;  Surgeon: Fanny Skates, MD;  Location: [place];  Service: General;  Laterality: Bilateral;  . NASAL SEPTUM SURGERY  08  . PILONIDAL CYST EXCISION    . THYROIDECTOMY, PARTIAL  mid 80's   right side    There were no vitals filed for this visit.      Subjective Assessment - 02/07/16 0941    Subjective I really liked the chip pack she gave me yesterday, I can already tell a small improvement. I'm going to [facility] today to see about getting compression garments before my flight Saturday to [place].  Pertinent History DCIS in right breast with bilateral mastectomy October 26, 2195 with 2 nodes removed.  Hx DCIS in 2004 with lumpectomy with no lymph nodes removed and 32 radiation treatments Developed redness swelling and pain in right chest with 3 tubes for 5 weeks and had antibiotic for infection in right chest  and developed problems rasising left arm and found to have a seroma on the left and had surgery for seroma on 12/13/2015 and still has an open wound Past history includes pinched nerves in neck and low back. she thinks she has something in her hip because she has pain when she walks a long distnace.     Patient Stated Goals my hope is to get this swelling down and to  learn how to manage it   Currently in Pain? No/denies               LYMPHEDEMA/ONCOLOGY QUESTIONNAIRE - 02/06/16 1626      Type   Cancer Type right breast cancer  left breast cancer 2004     Surgeries   Mastectomy Date 10/26/15   Sentinel Lymph Node Biopsy Date 10/26/15  on right   Number Lymph Nodes Removed 2     Date Lymphedema/Swelling Started   Date 12/20/15  worsened on right side during the last week     Treatment   Active Chemotherapy Treatment No   Past Chemotherapy Treatment No   Active Radiation Treatment No   Past Radiation Treatment No   Current Hormone Treatment No   Past Hormone Therapy No     What other symptoms do you have   Are you Having Heaviness or Tightness Yes   Are you having Pain Yes   Are you having pitting edema No   Is it Hard or Difficult finding clothes that fit No   Do you have infections Yes   Is there Decreased scar mobility Yes     Right Upper Extremity Lymphedema   15 cm Proximal to Olecranon Process 39.4 cm   Olecranon Process 29.5 cm   10 cm Proximal to Ulnar Styloid Process 24.6 cm   Just Proximal to Ulnar Styloid Process 17.1 cm   Across Hand at [employer] 20 cm   At [place] of 2nd Digit 6.1 cm     Left Upper Extremity Lymphedema   15 cm Proximal to Olecranon Process 40.3 cm   Olecranon Process 29.6 cm   10 cm Proximal to Ulnar Styloid Process 26 cm   Just Proximal to Ulnar Styloid Process 17.5 cm   Across Hand at [employer] 19.8 cm   At [place] of 2nd Digit 6 cm                  [facility] Adult PT Treatment/Exercise - 02/07/16 0001      Self-Care   Other Self-Care Comments  Instructed pt in lymphedema risk reduction and infection prevention practices answering her questions throughout, especially regarding upcoming lfight Saturday.     Shoulder Exercises: Supine   Horizontal ABduction Strengthening;Both;10 reps;Theraband   Theraband Level (Shoulder Horizontal ABduction) Level 2 (Red)   External  Rotation Strengthening;Both;10 reps;Theraband   Theraband Level (Shoulder External Rotation) Level 2 (Red)   Flexion Strengthening;Both;10 reps;Theraband  NArrow and Wide Grip 10 times each   Theraband Level (Shoulder Flexion) Level 2 (Red)   Other Supine Exercises Bil D2 with red theraband 5 times each     Manual Therapy   Manual Lymphatic Drainage (MLD) In Supine and  instructing pt throughout : Short neck, 5 diaphragmatic breaths, Rt inguinal nodes and Rt axillo-inguinal anastomosis, Rt upper chest and Rt UE upper arm to dorsal hand working proximal to distal then retracing all steps, then briefly into Lt S/L for further access to Rt anastomosis                PT Education - 02/07/16 0938    Education provided Yes   Education Details Instructed pt in lymphedema risk reduction and infection prevention practices; supine scapular series   Person(s) Educated Patient   Methods Explanation;Demonstration;Handout   Comprehension Verbalized understanding;Returned demonstration           Short Term Clinic Goals - 02/06/16 1709      CC Short Term Goal  #1   Title Patient will be able to independently verbalize lymphedema risk reduction practices   Time 3   Period Weeks   Status [facility] Goals - 02/06/16 1707      CC Long Term Goal  #1   Title pt will be independent in self manual lymph drainage for left upper chest    Time 5   Period Weeks   Status New     CC Long Term Goal  #2   Title Pt will be indendent in home exercise program for shoulder strengthening   Time 5   Period Weeks   Status New     CC Long Term Goal  #3   Title Pt will obtain appropriate compression garments including a compression t shirt and compression sleeve for long term management of edema   Time 5   Period Weeks   Status New            Plan - 02/07/16 1021    Clinical Impression Statement Pt did excellent today with review of manual lymph drainage and had  her practice this throughout session with therapist. She also did well with instruction of supine scapular series. Pt will do well to have review of these again Friday before she goes out of town next week.    Rehab Potential Good   Clinical Impairments Affecting Rehab Potential delayed healing of seroma surgery on left chest    PT Frequency 3x / week   PT Duration Other (comment)  5 weeks   PT Treatment/Interventions ADLs/Self Care Home Management;Therapeutic activities;Therapeutic exercise;Taping;Manual lymph drainage;Patient/family education;DME Instruction;Neuromuscular re-education   PT Next Visit Plan Review supine scapular series prn,  self MLD to include arm sequence as we did not have time for this today, assess if pt was able to obtain compression shirt and sleeve   Consulted and Agree with Plan of Care Patient      Patient will benefit from skilled therapeutic intervention in order to improve the following deficits and impairments:  Increased edema, Decreased knowledge of precautions, Pain, Decreased strength  Visit Diagnosis: Postmastectomy lymphedema  Localized swelling, mass and lump, trunk  Muscle weakness (generalized)  Stiffness of left shoulder, not elsewhere classified     Problem List Patient Active Problem List   Diagnosis Date Noted  . Induration, breast   . Left shoulder pain   . Abscess 12/09/2015  . Cancer of central portion of right breast ([place]) 10/26/2015  . Genetic testing 10/17/2015  . History of breast cancer in female 09/15/2015  . Breast cancer of upper-outer quadrant of right female breast ([place]) 09/15/2015  . Family history of breast cancer in  female 09/15/2015  . Family history of colon cancer 09/15/2015  . TB lung, latent 01/11/2015  . COPD (chronic obstructive pulmonary disease) ([place]) 10/07/2014  . Mediastinal lymphadenopathy 10/07/2014  . Diabetes mellitus type 2, controlled ([place]) 09/29/2014  . Pulmonary nodule 09/29/2014  . History of  chest pain 09/29/2014  . Essential hypertension 09/29/2014  . Chest pain 09/23/2014  . Hypothyroidism 02/14/2014  . Hip pain, right 10/12/2013  . Cervical cancer screening 03/09/2013  . Seasonal allergies 09/12/2012  . Nasal polyp 08/30/2012  . SOM (serous otitis media) 08/30/2012  . Dysuria 07/20/2012  . Peripheral neuropathy ([place]) 07/19/2012  . Cervical myofascial pain syndrome 06/08/2012  . Anemia 01/28/2012  . Hyperlipidemia 01/28/2012  . Hyperglycemia 01/28/2012  . Preventative health care 01/28/2012  . Pain of left heel 01/28/2012  . Neck pain 12/30/2011  . Muscle spasm 12/30/2011  . Anxiety and depression   . Osteoarthritis   . GERD (gastroesophageal reflux disease)   . Fibromyalgia   . Sleep apnea   . Obesity   . History of blood clots   . Fatty liver disease, nonalcoholic   . DDD (degenerative disc disease)     Otelia Limes, PTA 02/07/2016, 10:24 AM  [place], [place], [address] Phone: [phone]   Fax:  [phone]  Name: Susan [name] MRN: [id number] Date of Birth: 1950-10-26

## 2016-02-07 NOTE — Patient Instructions (Addendum)
Over Head Pull: Narrow and Wide Grip     Cancer Rehab [phone]   On back, knees bent, feet flat, band across thighs, elbows straight but relaxed. Pull hands apart (start). Keeping elbows straight, bring arms up and over head, hands toward floor. Keep pull steady on band. Hold momentarily. Return slowly, keeping pull steady, back to start. And then with a wider grip. Repeat _5-10__ times. Band color __red____   Side Pull: Double Arm   On back, knees bent, feet flat. Arms perpendicular to body, shoulder level, elbows straight but relaxed. Pull arms out to sides, elbows straight. Resistance band comes across collarbones, hands toward floor. Hold momentarily. Slowly return to starting position. Repeat 5-10___ times. Band color _red____   Sword   On back, knees bent, feet flat, left hand on left hip, right hand above left. Pull right arm DIAGONALLY (hip to shoulder) across chest. Bring right arm along head toward floor. Hold momentarily. Slowly return to starting position. Repeat _5-10__ times. Do with left arm. Band color __red____   Shoulder Rotation: Double Arm   On back, knees bent, feet flat, elbows tucked at sides, bent 90, hands palms up. Pull hands apart and down toward floor, keeping elbows near sides. Hold momentarily. Slowly return to starting position. Repeat _5-10__ times. Band color __red____   Manual Lymph Drainage for Rt side:  Circles near neck above collarbones 5 times.  Deep Effective Breath   Standing, sitting, or laying down, place both hands on the belly. Take a deep breath IN, expanding the belly; then breath OUT, contracting the belly. Repeat __5__ times. Do __2-3__ sessions per day and before your self massage.  http://gt2.exer.us/866    Copyright  VHI. All rights reserved.  Axilla to Inguinal Nodes - Sweep   On Right side, make 5 circles at groin at panty line, then pump _5__ times from armpit along side of trunk to outer hip, making your other  pathway. Do __1_ time per day.  Copyright  VHI. All rights reserved.  Arm Posterior: Elbow to Shoulder - Sweep   Pump _5__ times from back of elbow to top of shoulder. Then inner to outer upper arm _5_ times, then outer arm again _5_ times. Then back to the pathway _2-3_ times. Do _1__ time per day.  Copyright  VHI. All rights reserved.  ARM: [place] Wrist to Elbow - Sweep   Pump or stationary circles _5__ times from wrist to elbow making sure to do both sides of the forearm. Then retrace your steps to the outer arm, and the pathway _2-3_ times each. Do _1__ time per day.  Copyright  VHI. All rights reserved.  ARM: Dorsum of Hand to Shoulder - Sweep   Pump or stationary circles _5__ times on back of hand including knuckle spaces and individual fingers if needed working up towards the wrist, then retrace all your steps working back up the forearm, doing both sides; upper outer arm and back to pathway _2-3_ times each. Then do 5 circles again at Right groin where you started! Good job!! Do __1_ time per day.  Copyright  VHI. All rights reserved.

## 2016-02-08 ENCOUNTER — Ambulatory Visit: Payer: 59 | Admitting: Physical Therapy

## 2016-02-10 ENCOUNTER — Ambulatory Visit: Payer: 59 | Admitting: Physical Therapy

## 2016-02-10 ENCOUNTER — Encounter: Payer: Self-pay | Admitting: Physical Therapy

## 2016-02-10 DIAGNOSIS — I972 Postmastectomy lymphedema syndrome: Secondary | ICD-10-CM

## 2016-02-10 DIAGNOSIS — R222 Localized swelling, mass and lump, trunk: Secondary | ICD-10-CM

## 2016-02-10 NOTE — Patient Instructions (Signed)
1. Deep breathing 2. Collarbone 3. Prepare lymph nodes (right groin) 4. Create pathway (clear traffic jam near the groin first) 5. Move fluid from chest towards pathway 6. Turn on left side and continue to clear pathway from armpit to groin 7. Turn back on your back and clear pathway again 8. End with lymph nodes (right groin)

## 2016-02-10 NOTE — Therapy (Addendum)
[facility] [address] [place], [place], [address] Phone: [phone]   Fax:  [phone]  Physical Therapy Treatment  Patient Details  Name: Susan [name] MRN: [id number] Date of Birth: 07/31/1950 Referring Provider: Dr. Ingram  Encounter Date: 02/10/2016      PT End of Session - 02/10/16 1109    Visit Number 3   Number of Visits 15   Date for PT Re-Evaluation 03/12/16   PT Start Time 0930   PT Stop Time 1017   PT Time Calculation (min) 47 min   Activity Tolerance Patient tolerated treatment well   Behavior During Therapy WFL for tasks assessed/performed      Past Medical History:  Diagnosis Date  . Anemia 01/28/2012  . Anxiety   . Cancer (HCC) 2004   ductal carcinoma; lumpectomy  . Cellulitis of chest wall 12/09/2015  . Complication of anesthesia    difficulty voiding after anesthesia  . COPD (chronic obstructive pulmonary disease) (HCC)    mild  . DDD (degenerative disc disease)   . Depression   . Diabetes mellitus without complication (HCC)   . DJD (degenerative joint disease)   . Dysrhythmia    hx palpitations?panic attack  . Fatty liver disease, nonalcoholic   . Fibromyalgia   . GERD (gastroesophageal reflux disease)   . Hip pain, right 10/12/2013  . History of blood clots 2004   during cancer treatment  . HTN (hypertension) 01/28/2012  . Hyperglycemia 01/28/2012  . Hyperlipidemia 01/28/2012  . Hypertension   . Hypothyroidism 02/14/2014  . Muscle spasm 12/30/2011  . Nasal polyp 08/30/2012  . Obesity   . Osteoarthritis   . Osteopenia   . Pain of left heel 01/28/2012  . Peripheral neuropathy (HCC)   . Peripheral vascular disease (HCC) 04   axillary,upper chest after 3 weeks on tamoxifen  . Pneumonia    hx  . Preventative health care 01/28/2012  . Shortness of breath dyspnea    occ on exersion  . Sleep apnea    on CPAP  . Tobacco abuse-unspec 10/12/2013  . Tuberculosis    latent  finishing 9  months tx on last month    Past Surgical History:  Procedure Laterality Date  . ABDOMINAL HYSTERECTOMY  1991  . APPENDECTOMY  91  . BILATERAL OOPHORECTOMY  01/2003  . BREAST SURGERY Left 2004   lumpectomy  . CARPAL TUNNEL RELEASE     right  . INCISION AND DRAINAGE ABSCESS Left 12/13/2015   Procedure: DRAINAGE LEFT MASTECTOMY WOUND INFECTION;  Surgeon: Haywood Ingram, MD;  [facility];  Service: General;  Laterality: Left;  . JOINT REPLACEMENT     right total knee  . MASTECTOMY W/ SENTINEL NODE BIOPSY Right 10/26/2015  . MASTECTOMY W/ SENTINEL NODE BIOPSY Bilateral 10/26/2015   Procedure: RIGHT TOTAL MASTECTOMY WITH RIGHT SENTINEL LYMPH NODE BIOPSY, INJECT BLUE DYE RIGHT BREAST, LEFT BREAST PROPHYLACTIC MASTECTOMY;  Surgeon: Haywood Ingram, MD;  Location[facility];  Service: General;  Laterality: Bilateral;  . NASAL SEPTUM SURGERY  08  . PILONIDAL CYST EXCISION    . THYROIDECTOMY, PARTIAL  mid 80's   right side    There were no vitals filed for this visit.      Subjective Assessment - 02/10/16 0941    Subjective I did the massage on Wednesday. I didn't have time to do it yesterday. I was wondering if there was a video to watch because I didn't feel like I knew what to do.      Pertinent History DCIS in right breast with bilateral mastectomy October 26, 2195 with 2 nodes removed.  Hx DCIS in 2004 with lumpectomy with no lymph nodes removed and 32 radiation treatments Developed redness swelling and pain in right chest with 3 tubes for 5 weeks and had antibiotic for infection in right chest  and developed problems rasising left arm and found to have a seroma on the left and had surgery for seroma on 12/13/2015 and still has an open wound Past history includes pinched nerves in neck and low back. she thinks she has something in her hip because she has pain when she walks a long distnace.     Patient Stated Goals my hope is to get this swelling down and to learn how to manage it   Currently in Pain?  No/denies   Pain Score 0-No pain                         [facility] Adult PT Treatment/Exercise - 02/10/16 0001      Manual Therapy   Manual Therapy Edema management   Edema Management assessed pt's compression tank and compression sleeve for fit   Manual Lymphatic Drainage (MLD) In Supine and instructing pt throughout while having patient demonstrate (therapist completed about 10 min of MLD - rest was patient demonstrating): Short neck, 5 diaphragmatic breaths, Rt inguinal nodes and Rt axillo-inguinal anastomosis, Rt upper chest then into Lt S/L for further access to Rt anastomosis, back to supine and retracing pathway                   Short Term Clinic Goals - 02/06/16 1709      CC Short Term Goal  #1   Title Patient will be able to independently verbalize lymphedema risk reduction practices   Time 3   Period Weeks   Status New             Long Term Clinic Goals - 02/06/16 1707      CC Long Term Goal  #1   Title pt will be independent in self manual lymph drainage for left upper chest    Time 5   Period Weeks   Status New     CC Long Term Goal  #2   Title Pt will be indendent in home exercise program for shoulder strengthening   Time 5   Period Weeks   Status New     CC Long Term Goal  #3   Title Pt will obtain appropriate compression garments including a compression t shirt and compression sleeve for long term management of edema   Time 5   Period Weeks   Status New            Plan - 02/10/16 1109    Clinical Impression Statement Pt had questions about manual lymphatic drainage today. Gave patient another handout with bullet points to help her remember the sequence. Pt also sent home with DVD on self MLD to review. She was able to correctly demonstrate correct technique today. Therapist had patient complete entire sequence. Pt purchased a compression tank top and compression sleeve and both fit her well. She will be wearing them when  she flies this weekend.    Rehab Potential Good   Clinical Impairments Affecting Rehab Potential delayed healing of seroma surgery on left chest    PT Frequency 3x / week   PT Duration Other (comment)   PT Treatment/Interventions ADLs/Self Care Home Management;Therapeutic   activities;Therapeutic exercise;Taping;Manual lymph drainage;Patient/family education;DME Instruction;Neuromuscular re-education   PT Next Visit Plan Review supine scapular series because did not have time this session,  self MLD to include arm sequence as we did not have time for this today,    PT Home Exercise Plan Practice Lt shoulder A/ROM in front of mirror to decrease scapular compensations   Consulted and Agree with Plan of Care Patient      Patient will benefit from skilled therapeutic intervention in order to improve the following deficits and impairments:  Increased edema, Decreased knowledge of precautions, Pain, Decreased strength  Visit Diagnosis: Postmastectomy lymphedema  Localized swelling, mass and lump, trunk     Problem List Patient Active Problem List   Diagnosis Date Noted  . Induration, breast   . Left shoulder pain   . Abscess 12/09/2015  . Cancer of central portion of right breast ([place]) 10/26/2015  . Genetic testing 10/17/2015  . History of breast cancer in female 09/15/2015  . Breast cancer of upper-outer quadrant of right female breast ([place]) 09/15/2015  . Family history of breast cancer in female 09/15/2015  . Family history of colon cancer 09/15/2015  . TB lung, latent 01/11/2015  . COPD (chronic obstructive pulmonary disease) ([place]) 10/07/2014  . Mediastinal lymphadenopathy 10/07/2014  . Diabetes mellitus type 2, controlled ([place]) 09/29/2014  . Pulmonary nodule 09/29/2014  . History of chest pain 09/29/2014  . Essential hypertension 09/29/2014  . Chest pain 09/23/2014  . Hypothyroidism 02/14/2014  . Hip pain, right 10/12/2013  . Cervical cancer screening 03/09/2013  . Seasonal  allergies 09/12/2012  . Nasal polyp 08/30/2012  . SOM (serous otitis media) 08/30/2012  . Dysuria 07/20/2012  . Peripheral neuropathy ([place]) 07/19/2012  . Cervical myofascial pain syndrome 06/08/2012  . Anemia 01/28/2012  . Hyperlipidemia 01/28/2012  . Hyperglycemia 01/28/2012  . Preventative health care 01/28/2012  . Pain of left heel 01/28/2012  . Neck pain 12/30/2011  . Muscle spasm 12/30/2011  . Anxiety and depression   . Osteoarthritis   . GERD (gastroesophageal reflux disease)   . Fibromyalgia   . Sleep apnea   . Obesity   . History of blood clots   . Fatty liver disease, nonalcoholic   . DDD (degenerative disc disease)     Susan [name] 02/10/2016, 11:12 AM  [place], [place], [address] Phone: [phone]   Fax:  [phone]  Name: Susan [name] MRN: [id number] Date of Birth: 08/10/1950  Allyson Sabal, PT 02/10/16 11:13 AM  PHYSICAL THERAPY DISCHARGE SUMMARY  Visits from Start of Care: 3  Current functional level related to goals / functional outcomes: Pt reports the compression tank top is helping and she has watched the lymphedema DVD and feels able to control lymphedema independently  Remaining deficits: Per pt phone call patient does not have any   Education / Equipment: Self MLD, compression garments Plan: Patient agrees to discharge.  Patient goals were met. Patient is being discharged due to the patient's request.  ?????

## 2016-02-21 ENCOUNTER — Ambulatory Visit: Payer: 59

## 2016-02-22 ENCOUNTER — Ambulatory Visit: Payer: 59

## 2016-02-24 ENCOUNTER — Ambulatory Visit: Payer: 59 | Admitting: Physical Therapy

## 2016-03-02 ENCOUNTER — Encounter: Payer: 59 | Admitting: Physical Therapy

## 2016-03-05 ENCOUNTER — Encounter: Payer: 59 | Admitting: Physical Therapy

## 2016-03-07 ENCOUNTER — Encounter: Payer: 59 | Admitting: Physical Therapy

## 2016-03-08 ENCOUNTER — Other Ambulatory Visit: Payer: Self-pay | Admitting: Family Medicine

## 2016-03-08 DIAGNOSIS — E038 Other specified hypothyroidism: Secondary | ICD-10-CM

## 2016-03-09 ENCOUNTER — Encounter: Payer: 59 | Admitting: Physical Therapy

## 2016-03-14 NOTE — Telephone Encounter (Signed)
01/2016 last ov and nl tsh

## 2016-03-16 ENCOUNTER — Encounter: Payer: 59 | Admitting: Physical Therapy

## 2016-03-17 ENCOUNTER — Other Ambulatory Visit: Payer: Self-pay | Admitting: Family Medicine

## 2016-03-17 DIAGNOSIS — M797 Fibromyalgia: Secondary | ICD-10-CM

## 2016-04-03 ENCOUNTER — Ambulatory Visit (INDEPENDENT_AMBULATORY_CARE_PROVIDER_SITE_OTHER): Payer: 59 | Admitting: Family Medicine

## 2016-04-03 VITALS — BP 140/66 | HR 68 | Resp 17 | Ht 64.0 in | Wt 233.0 lb

## 2016-04-03 DIAGNOSIS — R062 Wheezing: Secondary | ICD-10-CM

## 2016-04-03 DIAGNOSIS — J9801 Acute bronchospasm: Secondary | ICD-10-CM | POA: Diagnosis not present

## 2016-04-03 MED ORDER — IPRATROPIUM BROMIDE 0.02 % IN SOLN
0.5000 mg | Freq: Once | RESPIRATORY_TRACT | Status: AC
  Administered 2016-04-03: 0.5 mg via RESPIRATORY_TRACT

## 2016-04-03 MED ORDER — HYDROCOD POLST-CPM POLST ER 10-8 MG/5ML PO SUER: 5.0000 mL | Freq: Two times a day (BID) | ORAL | 0 refills | Status: DC | PRN

## 2016-04-03 MED ORDER — FLUTICASONE-SALMETEROL 100-50 MCG/DOSE IN AEPB: 1.0000 | INHALATION_SPRAY | Freq: Two times a day (BID) | RESPIRATORY_TRACT | 3 refills | Status: DC

## 2016-04-03 MED ORDER — ALBUTEROL SULFATE (2.5 MG/3ML) 0.083% IN NEBU
2.5000 mg | INHALATION_SOLUTION | Freq: Once | RESPIRATORY_TRACT | Status: AC
  Administered 2016-04-03: 2.5 mg via RESPIRATORY_TRACT

## 2016-04-03 MED ORDER — PREDNISONE 20 MG PO TABS: 40.0000 mg | ORAL_TABLET | Freq: Every day | ORAL | 0 refills | Status: DC

## 2016-04-03 NOTE — Patient Instructions (Addendum)
Take Prednisone 20 mg, in mornings with breakfast as follows:  Take 2 tablets times 5 days.  Take Tussionex 5 ml every 12 hours for cough.  Resume Advair 1 puff twice daily throughout the remainder of the winter months.  Return in 5-7 days if your symptoms have not improved.  Congratulations on being a Cancer Survivor and Smoke Free! [employer] !  Carroll Sage. Kenton Kingfisher, MSN, FNP-C [facility] IF you received an x-ray today, you will receive an invoice from [facility]. Please contact [facility] at [phone] with questions or concerns regarding your invoice.   IF you received labwork today, you will receive an invoice from [employer]. Please contact [facility] at [phone] with questions or concerns regarding your invoice.   Our billing staff will not be able to assist you with questions regarding bills from these companies.  You will be contacted with the lab results as soon as they are available. The fastest way to get your results is to activate your My Chart account. Instructions are located on the last page of this paperwork. If you have not heard from [place] regarding the results in 2 weeks, please contact this office.     Bronchospasm, Adult A bronchospasm is a spasm or tightening of the airways going into the lungs. During a bronchospasm breathing becomes more difficult because the airways get smaller. When this happens there can be coughing, a whistling sound when breathing (wheezing), and difficulty breathing. Bronchospasm is often associated with asthma, but not all patients who experience a bronchospasm have asthma. What are the causes? A bronchospasm is caused by inflammation or irritation of the airways. The inflammation or irritation may be triggered by:  Allergies (such as to animals, pollen, food, or mold). Allergens that cause bronchospasm may cause wheezing immediately after  exposure or many hours later.  Infection. Viral infections are believed to be the most common cause of bronchospasm.  Exercise.  Irritants (such as pollution, cigarette smoke, strong odors, aerosol sprays, and paint fumes).  Weather changes. Winds increase molds and pollens in the air. Rain refreshes the air by washing irritants out. Cold air may cause inflammation.  Stress and emotional upset. What are the signs or symptoms?  Wheezing.  Excessive nighttime coughing.  Frequent or severe coughing with a simple cold.  Chest tightness.  Shortness of breath. How is this diagnosed? Bronchospasm is usually diagnosed through a history and physical exam. Tests, such as chest X-rays, are sometimes done to look for other conditions. How is this treated?  Inhaled medicines can be given to open up your airways and help you breathe. The medicines can be given using either an inhaler or a nebulizer machine.  Corticosteroid medicines may be given for severe bronchospasm, usually when it is associated with asthma. Follow these instructions at home:  Always have a plan prepared for seeking medical care. Know when to call your health care provider and local emergency services (911 in the [place] Know where you can access local emergency care.  Only take medicines as directed by your health care provider.  If you were prescribed an inhaler or nebulizer machine, ask your health care provider to explain how to use it correctly. Always use a spacer with your inhaler if you were given one.  It is necessary to remain calm during an attack. Try to relax and breathe more slowly.  Control your home environment in the following ways:  Change your heating  and air conditioning filter at least once a month.  Limit your use of fireplaces and wood stoves.  Do not smoke and do not allow smoking in your home.  Avoid exposure to perfumes and fragrances.  Get rid of pests (such as roaches and mice) and  their droppings.  Throw away plants if you see mold on them.  Keep your house clean and dust free.  Replace carpet with wood, tile, or vinyl flooring. Carpet can trap dander and dust.  Use allergy-proof pillows, mattress covers, and box spring covers.  Wash bed sheets and blankets every week in hot water and dry them in a dryer.  Use blankets that are made of polyester or cotton.  Wash hands frequently. Contact a health care provider if:  You have muscle aches.  You have chest pain.  The sputum changes from clear or white to yellow, green, gray, or bloody.  The sputum you cough up gets thicker.  There are problems that may be related to the medicine you are given, such as a rash, itching, swelling, or trouble breathing. Get help right away if:  You have worsening wheezing and coughing even after taking your prescribed medicines.  You have increased difficulty breathing.  You develop severe chest pain. This information is not intended to replace advice given to you by your health care provider. Make sure you discuss any questions you have with your health care provider. Document Released: 04/26/2003 Document Revised: 09/29/2015 Document Reviewed: 10/13/2012 Elsevier Interactive Patient Education  2017 [employer].

## 2016-04-03 NOTE — Progress Notes (Signed)
Patient ID: Susan [name], female    DOB: 1950-06-23, 65 y.o.   MRN: [id number]  PCP: [name], MD  Chief Complaint  Patient presents with  . Cough    Onset 10 days  . Wheezing    Subjective:   HPI 65 year old female, former smoker, present for coughing times 10 days and wheezing times 3 days. Patient is established here at [facility] and regularly is seen by Dr. Brigitte Pulse. Reports that she was dx with COPD 1.5 years ago and she recently stopped smoking in the early summer.  She recently was dx with breast cancer and had a mastectomy of both breast. Patient feels that her worsening cough is possibly related to a neighbor that frequently burns leaves. Reports a persistent cough anytime she walks outside due to the residual smoke. Notes some sinus drainage and has taken Mucinex DM every 12 hours over the last 7 days. Cough is productive at times. Over the last 3 days, she has had to use Albuterol 2-3 per day x 3 days for episodes of wheezing. Reports more fatigue over last few days.  Denies shortness of breath with activity.  Social History   Social History  . Marital status: Married    Spouse name: N/A  . Number of children: N/A  . Years of education: N/A   Occupational History  . Not on file.   Social History Main Topics  . Smoking status: Former Smoker    Packs/day: 0.50    Years: 42.00    Types: Cigarettes    Quit date: 10/30/2015  . Smokeless tobacco: Never Used  . Alcohol use No  . Drug use: No  . Sexual activity: No   Other Topics Concern  . Not on file   Social History Narrative  . No narrative on file    Family History  Problem Relation Age of Onset  . Cancer Mother 26    liver cancer, hep c  . Heart disease Mother     chf  . Hypertension Mother   . Hyperlipidemia Mother   . Osteoporosis Mother   . Cancer Father 61    lung; smoker  . COPD Father   . Heart disease Sister     mvp  . Breast cancer Sister     dx. 21s  . Non-Hodgkin's lymphoma Sister  14    large B cell  . Hypertension Brother   . Arthritis Brother   . Kidney disease Brother   . Prostate cancer Brother     dx. early 67s  . Other Daughter     Beal's Syndrome  . Arthritis Daughter     Beal's  . Arthritis Son     Beal's connective tissue disease  . Vision loss Maternal Grandmother   . Dementia Maternal Grandmother   . Coronary artery disease Maternal Grandmother   . Heart Problems Maternal Grandmother   . Vision loss Paternal Grandfather   . Hypertension Brother   . Benign prostatic hyperplasia Brother   . Leukemia Brother     chronic lymphatic leukemia - small/B cell  . Hypertension Brother   . COPD Brother   . Prostate cancer Brother     dx. mid-60s  . Myelodysplastic syndrome Brother     dx. early 46s  . Hypertension Brother   . Hyperlipidemia Brother   . Prostate cancer Brother     dx. mid-70s  . Melanoma Brother     (x2) melanomas dx. late 60s-early 47s  . Mental  illness Brother     schizophrenia  . Breast cancer Brother     dx. 58s  . Cirrhosis Brother     hep c  . Hypertension Sister   . Other Sister     thalessemia, anemia; hx of hysterectomy in her 1s  . Hyperlipidemia Sister   . Gout Sister   . Diverticulitis Sister   . Other Daughter     overweight  . Breast cancer Maternal Aunt 63  . Lung cancer Cousin     maternal 1st cousin dx. 22 or younger; former smoker  . Colon cancer Cousin     maternal 1st cousin dx. late 57s-early 60s  . Cancer Cousin     maternal 1st cousin d. NOS cancer  . Emphysema Paternal Aunt   . Lung cancer Paternal Aunt     d. early 27s; smoker  . Leukemia Cousin     paternal 1st cousin d. early 61s  . Colon cancer Other 34    niece  . Melanoma Other     niece   Review of Systems  See HPI  Patient Active Problem List   Diagnosis Date Noted  . Induration, breast   . Left shoulder pain   . Abscess 12/09/2015  . Cancer of central portion of right breast ([place]) 10/26/2015  . Genetic testing 10/17/2015    . History of breast cancer in female 09/15/2015  . Breast cancer of upper-outer quadrant of right female breast ([place]) 09/15/2015  . Family history of breast cancer in female 09/15/2015  . Family history of colon cancer 09/15/2015  . TB lung, latent 01/11/2015  . COPD (chronic obstructive pulmonary disease) ([place]) 10/07/2014  . Mediastinal lymphadenopathy 10/07/2014  . Diabetes mellitus type 2, controlled ([place]) 09/29/2014  . Pulmonary nodule 09/29/2014  . History of chest pain 09/29/2014  . Essential hypertension 09/29/2014  . Chest pain 09/23/2014  . Hypothyroidism 02/14/2014  . Hip pain, right 10/12/2013  . Cervical cancer screening 03/09/2013  . Seasonal allergies 09/12/2012  . Nasal polyp 08/30/2012  . SOM (serous otitis media) 08/30/2012  . Dysuria 07/20/2012  . Peripheral neuropathy ([place]) 07/19/2012  . Cervical myofascial pain syndrome 06/08/2012  . Anemia 01/28/2012  . Hyperlipidemia 01/28/2012  . Hyperglycemia 01/28/2012  . Preventative health care 01/28/2012  . Pain of left heel 01/28/2012  . Neck pain 12/30/2011  . Muscle spasm 12/30/2011  . Anxiety and depression   . Osteoarthritis   . GERD (gastroesophageal reflux disease)   . Fibromyalgia   . Sleep apnea   . Obesity   . History of blood clots   . Fatty liver disease, nonalcoholic   . DDD (degenerative disc disease)      Prior to Admission medications   Medication Sig Start Date End Date Taking? Authorizing Provider  albuterol (PROVENTIL HFA;VENTOLIN HFA) 108 (90 BASE) MCG/ACT inhaler Inhale 2 puffs into the lungs every 4 (four) hours as needed for wheezing or shortness of breath (cough, shortness of breath or wheezing.). 08/09/13  Yes [name], MD  Ascorbic Acid (VITAMIN C PO) Take 1 tablet by mouth 3 (three) times daily.   Yes Historical Provider, MD  aspirin EC 81 MG tablet Take 81 mg by mouth daily.   Yes Historical Provider, MD  cholecalciferol (VITAMIN D) 1000 UNITS tablet Take 1,000 Units by mouth  daily.   Yes Historical Provider, MD  cyclobenzaprine (FLEXERIL) 10 MG tablet Take 1 tablet (10 mg total) by mouth 3 (three) times daily as needed for muscle  spasms. 01/25/16  Yes [name], MD  DULoxetine (CYMBALTA) 30 MG capsule *INSSURANCE ONLY ALLOWS 30* TAKE 3 CAPSULES (90 MG TOTAL) BY MOUTH DAILY. 03/20/16  Yes [name], MD  esomeprazole (NEXIUM) 20 MG capsule Take 20 mg by mouth daily at 12 noon.   Yes Historical Provider, MD  fenofibrate 160 MG tablet Take 1 tablet (160 mg total) by mouth daily. 06/28/15  Yes Jessica C Copland, MD  hydrochlorothiazide (HYDRODIURIL) 25 MG tablet TAKE 1 TABLET (25 MG TOTAL) BY MOUTH DAILY. 01/25/16  Yes [name], MD  HYDROcodone-acetaminophen (NORCO/VICODIN) 5-325 MG tablet Take 1 tablet by mouth every 6 (six) hours as needed for severe pain. 01/25/16  Yes [name], MD  ibuprofen (ADVIL,MOTRIN) 200 MG tablet Take 800 mg by mouth at bedtime.   Yes Historical Provider, MD  levothyroxine (SYNTHROID, LEVOTHROID) 25 MCG tablet TAKE 1 TABLET (25 MCG TOTAL) BY MOUTH DAILY.**INS ALLOWS 30 DAYS ONLY** 03/14/16  Yes [name], MD  metFORMIN (GLUCOPHAGE) 500 MG tablet TAKE 1 TABLET (500 MG TOTAL) BY MOUTH 2 (TWO) TIMES DAILY WITH A MEAL. 03/20/16  Yes [name], MD  metoprolol (LOPRESSOR) 50 MG tablet TAKE 1 TABLET (50 MG TOTAL) BY MOUTH 2 (TWO) TIMES DAILY. 01/04/16  Yes [name], MD  Multiple Vitamins-Minerals (MULTIVITAMIN WITH MINERALS) tablet Take 1 tablet by mouth daily.   Yes Historical Provider, MD  temazepam (RESTORIL) 15 MG capsule Take 1 capsule (15 mg total) by mouth at bedtime as needed for sleep. 01/25/16  Yes [name], MD     Allergies  Allergen Reactions  . Chantix [Varenicline] Other (See Comments)    "Felt like having mental breakdown"  . Ciprofloxacin Other (See Comments)    Muscle tightness, tendon aching and pain  . Penicillins Anaphylaxis and Swelling  . Tamoxifen Other (See Comments)    Blood clots  . Levaquin [Levofloxacin] Other  (See Comments)    Severe tendon pain  . Lyrica [Pregabalin] Swelling  . Neurontin [Gabapentin] Swelling  . Morphine And Related Other (See Comments)    MORPHINE DRIP - causes pt to feel irritable and itch  . Ceclor [Cefaclor] Rash  . Other Other (See Comments)    Redness from tape and bandaides  . Sulfa Antibiotics Rash      Objective:  Physical Exam  Constitutional: She is oriented to person, place, and time. She appears well-developed and well-nourished.  HENT:  Head: Normocephalic and atraumatic.  Right Ear: External ear normal.  Left Ear: External ear normal.  Nose: Mucosal edema present.  Mouth/Throat: Oropharynx is clear and moist.  Neck: Normal range of motion. Neck supple.  Cardiovascular: Normal rate, regular rhythm, normal heart sounds and intact distal pulses.   Pulmonary/Chest: Effort normal. No respiratory distress. She has wheezes.  Musculoskeletal: Normal range of motion.  Lymphadenopathy:    She has no cervical adenopathy.  Neurological: She is alert and oriented to person, place, and time.  Skin: Skin is warm and dry.  Psychiatric: She has a normal mood and affect. Her behavior is normal. Judgment and thought content normal.     Vitals:   04/03/16 0905  BP: 140/66  Pulse: 68  Resp: 17  Temp: Assessment & Plan:  1. Bronchospasm Plan: Prednisone 40 mg once with breakfast, times 5 days.  2. Wheezing - albuterol (PROVENTIL) (2.5 MG/3ML) 0.083% nebulizer solution 2.5 mg; Take 3 mLs (2.5 mg total) by nebulization once. - ipratropium (ATROVENT) nebulizer solution 0.5 mg; Take  2.5 mLs (0.5 mg total) by nebulization once. Plan: Albuterol (Proventil) inhaler, 2 puffs every 4 hours as needed for shortness of breath or wheezing.  Follow-up as needed. If no improvement, will consider a coarse of antibiotics. Due to allergies, likely doxycycline.  Carroll Sage. Kenton Kingfisher, MSN, FNP-C [facility]

## 2016-04-23 ENCOUNTER — Encounter: Payer: Self-pay | Admitting: Family Medicine

## 2016-05-19 ENCOUNTER — Ambulatory Visit (INDEPENDENT_AMBULATORY_CARE_PROVIDER_SITE_OTHER): Payer: 59 | Admitting: Family Medicine

## 2016-05-19 VITALS — BP 128/70 | HR 76 | Temp 98.5°F | Resp 17 | Ht 65.0 in | Wt 232.5 lb

## 2016-05-19 DIAGNOSIS — J441 Chronic obstructive pulmonary disease with (acute) exacerbation: Secondary | ICD-10-CM | POA: Diagnosis not present

## 2016-05-19 MED ORDER — ALBUTEROL SULFATE (2.5 MG/3ML) 0.083% IN NEBU
2.5000 mg | INHALATION_SOLUTION | Freq: Once | RESPIRATORY_TRACT | Status: AC
  Administered 2016-05-19: 2.5 mg via RESPIRATORY_TRACT

## 2016-05-19 MED ORDER — SPACER/AERO-HOLDING CHAMBERS DEVI: 0 refills | Status: DC

## 2016-05-19 MED ORDER — HYDROCOD POLST-CPM POLST ER 10-8 MG/5ML PO SUER: 5.0000 mL | Freq: Two times a day (BID) | ORAL | 0 refills | Status: DC | PRN

## 2016-05-19 MED ORDER — GUAIFENESIN ER 1200 MG PO TB12: 1.0000 | ORAL_TABLET | Freq: Two times a day (BID) | ORAL | 1 refills | Status: DC | PRN

## 2016-05-19 MED ORDER — PREDNISONE 50 MG PO TABS: 50.0000 mg | ORAL_TABLET | Freq: Every day | ORAL | 0 refills | Status: DC

## 2016-05-19 MED ORDER — IPRATROPIUM BROMIDE 0.02 % IN SOLN
0.5000 mg | Freq: Once | RESPIRATORY_TRACT | Status: AC
  Administered 2016-05-19: 0.5 mg via RESPIRATORY_TRACT

## 2016-05-19 MED ORDER — AZITHROMYCIN 250 MG PO TABS: ORAL_TABLET | ORAL | 0 refills | Status: DC

## 2016-05-19 NOTE — Progress Notes (Signed)
Subjective:  By signing my name below, I, Susan [name], attest that this documentation has been prepared under the direction and in the presence of Susan Cheadle, MD. Electronically Signed: Moises [name], [place]. 05/19/2016 , 8:49 AM .  Patient was seen in Room 14 .   Patient ID: Susan [name], female    DOB: 02-03-1951, 66 y.o.   MRN: [id number] Chief Complaint  Patient presents with  . Shortness of Breath    x 1 week, worse yesterday  . Cough    x 1 week, worse yesterday   HPI Susan [name] is a 66 y.o. female who presents to [facility] complaining of cough with shortness of breath that started about a week ago. Patient recently had breast cancer requiring bilateral mastectomy in June, followed by extended course of numerous episodes of cellulitis and prolonged drainage at surgery site. She had to be hospitalized for IV antibiotics and I&D surgical site 6 weeks prior. She was seen 6 weeks prior for 2 weeks of COPD exacerbation. She stopped smoking before her mastectomy. She had been on Dulera, Symbicort and Advair for several years, resuming Advair 1 puff twice a day.   Patient complains cough, congestion and shortness of breath started about a week ago. She states it feels like symptoms from 6 weeks ago didn't really resolve. 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 When she started talking, she would start having a coughing fit and chest felt tight. She's been having productive coughs, yellow-white mucus. She's been using her Advair 1 puff bid and albuterol 2-3 timers a day. She slept with elevation last night, as she notes hard to breath when laying supine. She's been having some congestion and drainage with sore lymph nodes. She bought OTC Nyquil for her cough. She also had cough syrup from last visit leftover and took last dose of it to help with sleep.   Past Medical History:  Diagnosis Date  . Anemia 01/28/2012  . Anxiety   . Cancer [facility]) 2004   ductal  carcinoma; lumpectomy  . Cellulitis of chest wall 12/09/2015  . Complication of anesthesia    difficulty voiding after anesthesia  . COPD (chronic obstructive pulmonary disease) (HCC)    mild  . DDD (degenerative disc disease)   . Depression   . Diabetes mellitus without complication ([place])   . DJD (degenerative joint disease)   . Dysrhythmia    hx palpitations?panic attack  . Fatty liver disease, nonalcoholic   . Fibromyalgia   . GERD (gastroesophageal reflux disease)   . Hip pain, right 10/12/2013  . History of [name] clots 2004   during cancer treatment  . HTN (hypertension) 01/28/2012  . Hyperglycemia 01/28/2012  . Hyperlipidemia 01/28/2012  . Hypertension   . Hypothyroidism 02/14/2014  . Muscle spasm 12/30/2011  . Nasal polyp 08/30/2012  . Obesity   . Osteoarthritis   . Osteopenia   . Pain of left heel 01/28/2012  . Peripheral neuropathy ([place])   . Peripheral vascular disease ([place]) 04   axillary,upper chest after 3 weeks on tamoxifen  . Pneumonia    hx  . Preventative health care 01/28/2012  . Shortness of breath dyspnea    occ on exersion  . Sleep apnea    on CPAP  . Tobacco abuse-unspec 10/12/2013  . Tuberculosis    latent  finishing 9 months tx on last month   Prior to Admission medications   Medication Sig Start Date End Date Taking? Authorizing Provider  albuterol (PROVENTIL HFA;VENTOLIN HFA) 108 (90 BASE) MCG/ACT inhaler Inhale 2 puffs into the lungs every 4 (four) hours as needed for wheezing or shortness of breath (cough, shortness of breath or wheezing.). 08/09/13  Yes [name], MD  Ascorbic Acid (VITAMIN C PO) Take 1 tablet by mouth 3 (three) times daily.   Yes Historical Provider, MD  aspirin EC 81 MG tablet Take 81 mg by mouth daily.   Yes Historical Provider, MD  cholecalciferol (VITAMIN D) 1000 UNITS tablet Take 1,000 Units by mouth daily.   Yes Historical Provider, MD  cyclobenzaprine (FLEXERIL) 10 MG tablet Take 1 tablet (10 mg total) by mouth 3 (three)  times daily as needed for muscle spasms. 01/25/16  Yes [name], MD  DULoxetine (CYMBALTA) 30 MG capsule *INSSURANCE ONLY ALLOWS 30* TAKE 3 CAPSULES (90 MG TOTAL) BY MOUTH DAILY. 03/20/16  Yes [name], MD  esomeprazole (NEXIUM) 20 MG capsule Take 20 mg by mouth daily at 12 noon.   Yes Historical Provider, MD  fenofibrate 160 MG tablet Take 1 tablet (160 mg total) by mouth daily. 06/28/15  Yes Gay Filler Copland, MD  Fluticasone-Salmeterol (ADVAIR) 100-50 MCG/DOSE AEPB Inhale 1 puff into the lungs 2 (two) times daily. 04/03/16  Yes Sedalia Muta, FNP  hydrochlorothiazide (HYDRODIURIL) 25 MG tablet TAKE 1 TABLET (25 MG TOTAL) BY MOUTH DAILY. 01/25/16  Yes [name], MD  HYDROcodone-acetaminophen (NORCO/VICODIN) 5-325 MG tablet Take 1 tablet by mouth every 6 (six) hours as needed for severe pain. 01/25/16  Yes [name], MD  ibuprofen (ADVIL,MOTRIN) 200 MG tablet Take 800 mg by mouth at bedtime.   Yes Historical Provider, MD  levothyroxine (SYNTHROID, LEVOTHROID) 25 MCG tablet TAKE 1 TABLET (25 MCG TOTAL) BY MOUTH DAILY.**INS ALLOWS 30 DAYS ONLY** 03/14/16  Yes [name], MD  metFORMIN (GLUCOPHAGE) 500 MG tablet TAKE 1 TABLET (500 MG TOTAL) BY MOUTH 2 (TWO) TIMES DAILY WITH A MEAL. 03/20/16  Yes [name], MD  metoprolol (LOPRESSOR) 50 MG tablet TAKE 1 TABLET (50 MG TOTAL) BY MOUTH 2 (TWO) TIMES DAILY. 01/04/16  Yes [name], MD  Multiple Vitamins-Minerals (MULTIVITAMIN WITH MINERALS) tablet Take 1 tablet by mouth daily.   Yes Historical Provider, MD  temazepam (RESTORIL) 15 MG capsule Take 1 capsule (15 mg total) by mouth at bedtime as needed for sleep. 01/25/16  Yes [name], MD   Allergies  Allergen Reactions  . Chantix [Varenicline] Other (See Comments)    "Felt like having mental breakdown"  . Ciprofloxacin Other (See Comments)    Muscle tightness, tendon aching and pain  . Penicillins Anaphylaxis and Swelling  . Tamoxifen Other (See Comments)    [name] clots  . Levaquin  [Levofloxacin] Other (See Comments)    Severe tendon pain  . Lyrica [Pregabalin] Swelling  . Neurontin [Gabapentin] Swelling  . Morphine And Related Other (See Comments)    MORPHINE DRIP - causes pt to feel irritable and itch  . Ceclor [Cefaclor] Rash  . Other Other (See Comments)    Redness from tape and bandaides  . Sulfa Antibiotics Rash   Review of Systems  Constitutional: Positive for fatigue. Negative for chills and fever.  HENT: Positive for congestion.   Respiratory: Positive for cough, chest tightness and shortness of breath. Negative for wheezing.   Cardiovascular: Negative for chest pain.  Gastrointestinal: Negative for abdominal pain, diarrhea, nausea and vomiting.  Hematological: Positive for adenopathy.       Objective:  Physical Exam  Constitutional: She is oriented to person, place, and time. She appears well-developed and well-nourished. No distress.  Patient able to talk in complete sentences without gasping, comfortable at rest  HENT:  Head: Normocephalic and atraumatic.  Right Ear: Tympanic membrane normal.  Left Ear: Tympanic membrane normal.  Nose: Nose normal.  Mouth/Throat: Oropharynx is clear and moist. No posterior oropharyngeal edema or posterior oropharyngeal erythema.  Eyes: EOM are normal. Pupils are equal, round, and reactive to light.  Neck: Neck supple.  Cardiovascular: Normal rate, regular rhythm and normal heart sounds.   No murmur heard. Pulmonary/Chest: Effort normal. No respiratory distress. She has wheezes.  Good air movement with expiratory wheeze, right > left On recheck, she still has excellent air movement, but more prolonged expiratory wheeze; however, she "feels better"  Musculoskeletal: Normal range of motion.  Lymphadenopathy:    She has no cervical adenopathy.  Neurological: She is alert and oriented to person, place, and time.  Skin: Skin is warm and dry.  Psychiatric: She has a normal mood and affect. Her behavior is  normal.  Nursing note and vitals reviewed.   BP 128/70   Pulse 76   Temp 98.5 F (36.9 C) (Oral)   Resp 17   Ht 5\' 5"  (1.651 m)   Wt 232 lb 8 oz (105.5 kg)   SpO2 94%   BMI 38.69 kg/m     Assessment & Plan:   1. COPD exacerbation ([place])     Orders Placed This Encounter  Procedures  . Care order/instruction:    AVS printed - let patient go! Please make sure pt stops by the front desk to schedule an appt with me on Tues morning for recheck.    Meds ordered this encounter  Medications  . albuterol (PROVENTIL) (2.5 MG/3ML) 0.083% nebulizer solution 2.5 mg  . ipratropium (ATROVENT) nebulizer solution 0.5 mg  . predniSONE (DELTASONE) 50 MG tablet    Sig: Take 1 tablet (50 mg total) by mouth daily with breakfast.    Dispense:  5 tablet    Refill:  0  . azithromycin (ZITHROMAX) 250 MG tablet    Sig: Take 2 tabs PO x 1 dose, then 1 tab PO QD x 4 days    Dispense:  6 tablet    Refill:  0  . Guaifenesin (MUCINEX MAXIMUM STRENGTH) 1200 MG TB12    Sig: Take 1 tablet (1,200 mg total) by mouth every 12 (twelve) hours as needed.    Dispense:  14 tablet    Refill:  1  . chlorpheniramine-HYDROcodone (TUSSIONEX PENNKINETIC ER) 10-8 MG/5ML SUER    Sig: Take 5 mLs by mouth every 12 (twelve) hours as needed for cough.    Dispense:  120 mL    Refill:  0  . Spacer/Aero-Holding Chambers DEVI    Sig: Use as directed with albuterol inhaler.  Please ensure pt receives whatever spacer works with her albuterol - looks like she prob has pro-ventil or ventolin    Dispense:  1 each    Refill:  0    I personally performed the services described in this documentation, which was scribed in my presence. The recorded information has been reviewed and considered, and addended by me as needed.   Susan [name], M.D.  [facility] [address] [place], [place] [address] [phone] phone [phone] fax  05/22/16 12:05 PM

## 2016-05-19 NOTE — Progress Notes (Signed)
     IF you received an x-ray today, you will receive an invoice from [facility]. Please contact [facility] at [phone] with questions or concerns regarding your invoice.   IF you received labwork today, you will receive an invoice from [facility]. Please contact [facility] at [phone] with questions or concerns regarding your invoice.   Our billing staff will not be able to assist you with questions regarding bills from these companies.  You will be contacted with the lab results as soon as they are available. The fastest way to get your results is to activate your My Chart account. Instructions are located on the last page of this paperwork. If you have not heard from us regarding the results in 2 weeks, please contact this office.     

## 2016-05-19 NOTE — Patient Instructions (Signed)
Chronic Obstructive Pulmonary Disease Exacerbation Chronic obstructive pulmonary disease (COPD) is a common lung condition in which airflow from the lungs is limited. COPD is a general term that can be used to describe many different lung problems that limit airflow, including chronic bronchitis and emphysema. COPD exacerbations are episodes when breathing symptoms become much worse and require extra treatment. Without treatment, COPD exacerbations can be life threatening, and frequent COPD exacerbations can cause further damage to your lungs. What are the causes?  Respiratory infections.  Exposure to smoke.  Exposure to air pollution, chemical fumes, or dust. Sometimes there is no apparent cause or trigger. What increases the risk?  Smoking cigarettes.  Older age.  Frequent prior COPD exacerbations. What are the signs or symptoms?  Increased coughing.  Increased thick spit (sputum) production.  Increased wheezing.  Increased shortness of breath.  Rapid breathing.  Chest tightness. How is this diagnosed? Your medical history, a physical exam, and tests will help your health care provider make a diagnosis. Tests may include:  A chest X-ray.  Basic lab tests.  Sputum testing.  An arterial blood gas test.  How is this treated? Depending on the severity of your COPD exacerbation, you may need to be admitted to a hospital for treatment. Some of the treatments commonly used to treat COPD exacerbations are:  Antibiotic medicines.  Bronchodilators. These are drugs that expand the air passages. They may be given with an inhaler or nebulizer. Spacer devices may be needed to help improve drug delivery.  Corticosteroid medicines.  Supplemental oxygen therapy.  Airway clearing techniques, such as noninvasive ventilation (NIV) and positive expiratory pressure (PEP). These provide respiratory support through a mask or other noninvasive device.  Follow these instructions at  home:  Do not smoke. Quitting smoking is very important to prevent COPD from getting worse and exacerbations from happening as often.  Avoid exposure to all substances that irritate the airway, especially to tobacco smoke.  If you were prescribed an antibiotic medicine, finish it all even if you start to feel better.  Take all medicines as directed by your health care provider.It is important to use correct technique with inhaled medicines.  Drink enough fluids to keep your urine clear or pale yellow (unless you have a medical condition that requires fluid restriction).  Use a cool mist vaporizer. This makes it easier to clear your chest when you cough.  If you have a home nebulizer and oxygen, continue to use them as directed.  Maintain all necessary vaccinations to prevent infections.  Exercise regularly.  Eat a healthy diet.  Keep all follow-up appointments as directed by your health care provider. Get help right away if:  You have worsening shortness of breath.  You have trouble talking.  You have severe chest pain.  You have blood in your sputum.  You have a fever.  You have weakness, vomit repeatedly, or faint.  You feel confused.  You continue to get worse. This information is not intended to replace advice given to you by your health care provider. Make sure you discuss any questions you have with your health care provider. Document Released: 02/18/2007 Document Revised: 09/29/2015 Document Reviewed: 12/26/2012 Elsevier Interactive Patient Education  2017 Elsevier Inc.  

## 2016-05-22 ENCOUNTER — Ambulatory Visit (INDEPENDENT_AMBULATORY_CARE_PROVIDER_SITE_OTHER): Payer: 59 | Admitting: Family Medicine

## 2016-05-22 ENCOUNTER — Ambulatory Visit (INDEPENDENT_AMBULATORY_CARE_PROVIDER_SITE_OTHER): Payer: 59

## 2016-05-22 ENCOUNTER — Other Ambulatory Visit: Payer: Self-pay | Admitting: Family Medicine

## 2016-05-22 VITALS — BP 130/72 | HR 84 | Temp 97.8°F | Resp 18 | Ht 65.0 in | Wt 235.0 lb

## 2016-05-22 DIAGNOSIS — J441 Chronic obstructive pulmonary disease with (acute) exacerbation: Secondary | ICD-10-CM | POA: Diagnosis not present

## 2016-05-22 MED ORDER — ALBUTEROL SULFATE (2.5 MG/3ML) 0.083% IN NEBU
2.5000 mg | INHALATION_SOLUTION | Freq: Once | RESPIRATORY_TRACT | Status: AC
  Administered 2016-05-22: 2.5 mg via RESPIRATORY_TRACT

## 2016-05-22 MED ORDER — IPRATROPIUM BROMIDE 0.02 % IN SOLN
0.5000 mg | Freq: Once | RESPIRATORY_TRACT | Status: AC
  Administered 2016-05-22: 0.5 mg via RESPIRATORY_TRACT

## 2016-05-22 MED ORDER — DOXYCYCLINE HYCLATE 100 MG PO CAPS: 100.0000 mg | ORAL_CAPSULE | Freq: Two times a day (BID) | ORAL | 0 refills | Status: DC

## 2016-05-22 MED ORDER — IPRATROPIUM-ALBUTEROL 0.5-2.5 (3) MG/3ML IN SOLN: 3.0000 mL | Freq: Four times a day (QID) | RESPIRATORY_TRACT | 1 refills | Status: DC

## 2016-05-22 NOTE — Progress Notes (Signed)
Subjective:  By signing my name below, I, Essence Howell, attest that this documentation has been prepared under the direction and in the presence of [name], MD Electronically Signed: Ladene Artist, ED Scribe 05/22/2016 at 12:07 PM.   Patient ID: Susan [name], female    DOB: 1951/03/12, 66 y.o.   MRN: [id number]  Chief Complaint  Patient presents with  . Follow-up    URI   HPI Susan [name] is a 66 y.o. female who presents to Primary Care at [facility] for a follow-up on an URI. Pt states that she is still experiencing a dry cough that is intermittently productive and intermittent diaphoresis. She has 1 more dose of the z-pak. She is also taking Advair twice daily with her last use being 2 hours ago, Mucinex, tussionex, albuterol inhaler with a spacer, Prednisone and has been consuming a lot of fluid.    Past Medical History:  Diagnosis Date  . Anemia 01/28/2012  . Anxiety   . Cancer [facility]) 2004   ductal carcinoma; lumpectomy  . Cellulitis of chest wall 12/09/2015  . Complication of anesthesia    difficulty voiding after anesthesia  . COPD (chronic obstructive pulmonary disease) (HCC)    mild  . DDD (degenerative disc disease)   . Depression   . Diabetes mellitus without complication ([place])   . DJD (degenerative joint disease)   . Dysrhythmia    hx palpitations?panic attack  . Fatty liver disease, nonalcoholic   . Fibromyalgia   . GERD (gastroesophageal reflux disease)   . Hip pain, right 10/12/2013  . History of blood clots 2004   during cancer treatment  . HTN (hypertension) 01/28/2012  . Hyperglycemia 01/28/2012  . Hyperlipidemia 01/28/2012  . Hypertension   . Hypothyroidism 02/14/2014  . Muscle spasm 12/30/2011  . Nasal polyp 08/30/2012  . Obesity   . Osteoarthritis   . Osteopenia   . Pain of left heel 01/28/2012  . Peripheral neuropathy ([place])   . Peripheral vascular disease ([place]) 04   axillary,upper chest after 3 weeks on tamoxifen  . Pneumonia    hx  .  Preventative health care 01/28/2012  . Shortness of breath dyspnea    occ on exersion  . Sleep apnea    on CPAP  . Tobacco abuse-unspec 10/12/2013  . Tuberculosis    latent  finishing 9 months tx on last month   Current Outpatient Prescriptions on File Prior to Visit  Medication Sig Dispense Refill  . albuterol (PROVENTIL HFA;VENTOLIN HFA) 108 (90 BASE) MCG/ACT inhaler Inhale 2 puffs into the lungs every 4 (four) hours as needed for wheezing or shortness of breath (cough, shortness of breath or wheezing.). 1 Inhaler 1  . Ascorbic Acid (VITAMIN C PO) Take 1 tablet by mouth 3 (three) times daily.    Marland Kitchen aspirin EC 81 MG tablet Take 81 mg by mouth daily.    Marland Kitchen azithromycin (ZITHROMAX) 250 MG tablet Take 2 tabs PO x 1 dose, then 1 tab PO QD x 4 days 6 tablet 0  . chlorpheniramine-HYDROcodone (TUSSIONEX PENNKINETIC ER) 10-8 MG/5ML SUER Take 5 mLs by mouth every 12 (twelve) hours as needed for cough. 120 mL 0  . cholecalciferol (VITAMIN D) 1000 UNITS tablet Take 1,000 Units by mouth daily.    . cyclobenzaprine (FLEXERIL) 10 MG tablet Take 1 tablet (10 mg total) by mouth 3 (three) times daily as needed for muscle spasms. 90 tablet 3  . DULoxetine (CYMBALTA) 30 MG capsule *INSSURANCE ONLY ALLOWS 30* TAKE 3  CAPSULES (90 MG TOTAL) BY MOUTH DAILY. 270 capsule 0  . esomeprazole (NEXIUM) 20 MG capsule Take 20 mg by mouth daily at 12 noon.    . fenofibrate 160 MG tablet Take 1 tablet (160 mg total) by mouth daily. 90 tablet 3  . Fluticasone-Salmeterol (ADVAIR) 100-50 MCG/DOSE AEPB Inhale 1 puff into the lungs 2 (two) times daily. 1 each 3  . Guaifenesin (MUCINEX MAXIMUM STRENGTH) 1200 MG TB12 Take 1 tablet (1,200 mg total) by mouth every 12 (twelve) hours as needed. 14 tablet 1  . hydrochlorothiazide (HYDRODIURIL) 25 MG tablet TAKE 1 TABLET (25 MG TOTAL) BY MOUTH DAILY. 90 tablet 3  . HYDROcodone-acetaminophen (NORCO/VICODIN) 5-325 MG tablet Take 1 tablet by mouth every 6 (six) hours as needed for severe pain.  20 tablet 0  . ibuprofen (ADVIL,MOTRIN) 200 MG tablet Take 800 mg by mouth at bedtime.    Marland Kitchen levothyroxine (SYNTHROID, LEVOTHROID) 25 MCG tablet TAKE 1 TABLET (25 MCG TOTAL) BY MOUTH DAILY.**INS ALLOWS 30 DAYS ONLY** 30 tablet 3  . metFORMIN (GLUCOPHAGE) 500 MG tablet TAKE 1 TABLET (500 MG TOTAL) BY MOUTH 2 (TWO) TIMES DAILY WITH A MEAL. 180 tablet 0  . metoprolol (LOPRESSOR) 50 MG tablet TAKE 1 TABLET (50 MG TOTAL) BY MOUTH 2 (TWO) TIMES DAILY. 180 tablet 1  . Multiple Vitamins-Minerals (MULTIVITAMIN WITH MINERALS) tablet Take 1 tablet by mouth daily.    . predniSONE (DELTASONE) 50 MG tablet Take 1 tablet (50 mg total) by mouth daily with breakfast. 5 tablet 0  . Spacer/Aero-Holding Dorise Bullion Use as directed with albuterol inhaler.  Please ensure pt receives whatever spacer works with her albuterol - looks like she prob has pro-ventil or ventolin 1 each 0  . temazepam (RESTORIL) 15 MG capsule Take 1 capsule (15 mg total) by mouth at bedtime as needed for sleep. 30 capsule 3   No current facility-administered medications on file prior to visit.    Allergies  Allergen Reactions  . Chantix [Varenicline] Other (See Comments)    "Felt like having mental breakdown"  . Ciprofloxacin Other (See Comments)    Muscle tightness, tendon aching and pain  . Penicillins Anaphylaxis and Swelling  . Tamoxifen Other (See Comments)    Blood clots  . Levaquin [Levofloxacin] Other (See Comments)    Severe tendon pain  . Lyrica [Pregabalin] Swelling  . Neurontin [Gabapentin] Swelling  . Morphine And Related Other (See Comments)    MORPHINE DRIP - causes pt to feel irritable and itch  . Ceclor [Cefaclor] Rash  . Other Other (See Comments)    Redness from tape and bandaides  . Sulfa Antibiotics Rash   Review of Systems  Constitutional: Positive for diaphoresis (intermittent).  Respiratory: Positive for cough.       Objective:   Physical Exam  Constitutional: She is oriented to person, place, and  time. She appears well-developed and well-nourished. No distress.  HENT:  Head: Normocephalic and atraumatic.  Eyes: Conjunctivae and EOM are normal.  Neck: Neck supple. No tracheal deviation present.  Cardiovascular: Normal rate.   Pulmonary/Chest: Effort normal. No respiratory distress. She has rhonchi.  Good air movement. Expiratory rhonchi, worse on the R.  Repeat exam: Improved air movement. Still with expiratory wheeze throughout, worse on R.   Musculoskeletal: Normal range of motion.  Neurological: She is alert and oriented to person, place, and time.  Skin: Skin is warm and dry.  Psychiatric: She has a normal mood and affect. Her behavior is normal.  Nursing note and  vitals reviewed.  BP 130/72 (BP Location: Right Arm, Patient Position: Sitting, Cuff Size: Large)   Pulse 84   Temp 97.8 F (36.6 C) (Oral)   Resp 18   Ht 5\' 5"  (1.651 m)   Wt 235 lb (106.6 kg)   SpO2 99%   BMI 39.11 kg/m     CXR reading by [name], MD: R lower lobe infiltrate  Dg Chest 2 View  Result Date: 05/22/2016 CLINICAL DATA:  COPD exacerbation with cough. EXAM: CHEST  2 VIEW COMPARISON:  09/23/2014 FINDINGS: The heart size and mediastinal contours are within normal limits. Both lungs are clear. The visualized skeletal structures are unremarkable. IMPRESSION: No active cardiopulmonary disease. Electronically Signed   By: Misty Stanley M.D.   On: 05/22/2016 12:56   Assessment & Plan:   1. COPD exacerbation ([place])     Orders Placed This Encounter  Procedures  . DME Nebulizer machine    Order Specific Question:   Patient needs a nebulizer to treat with the following condition    Answer:   COPD exacerbation [facility]) [id number]    Order Specific Question:   Patient needs a nebulizer to treat with the following condition    Answer:   Pneumonia [227785]  . DG Chest 2 View    Standing Status:   Future    Number of Occurrences:   1    Standing Expiration Date:   05/22/2017    Order Specific Question:    Reason for Exam (SYMPTOM  OR DIAGNOSIS REQUIRED)    Answer:   copd exacerbation still wiht cough, wheeze, shortness of breath s/p zpack and prednisone burst    Order Specific Question:   Preferred imaging location?    Answer:   External    Meds ordered this encounter  Medications  . albuterol (PROVENTIL) (2.5 MG/3ML) 0.083% nebulizer solution 2.5 mg  . ipratropium (ATROVENT) nebulizer solution 0.5 mg  . ipratropium-albuterol (DUONEB) 0.5-2.5 (3) MG/3ML SOLN    Sig: Take 3 mLs by nebulization 4 (four) times daily.    Dispense:  150 mL    Refill:  1  . doxycycline (VIBRAMYCIN) 100 MG capsule    Sig: Take 1 capsule (100 mg total) by mouth 2 (two) times daily.    Dispense:  20 capsule    Refill:  0    I personally performed the services described in this documentation, which was scribed in my presence. The recorded information has been reviewed and considered, and addended by me as needed.   [name], M.D.  [facility] [address] [place],  [address] [phone] phone [phone] fax  05/26/16 10:51 AM

## 2016-05-22 NOTE — Telephone Encounter (Signed)
Pt calling stating that she was waiting on a RX for a nebulizer please respond I spoke with Santiago Glad she said [facility] will get in touch with pt

## 2016-05-22 NOTE — Patient Instructions (Addendum)
   IF you received an x-ray today, you will receive an invoice from [facility]. Please contact [facility] at [phone] with questions or concerns regarding your invoice.   IF you received labwork today, you will receive an invoice from [facility]. Please contact [facility] at [phone] with questions or concerns regarding your invoice.   Our billing staff will not be able to assist you with questions regarding bills from these companies.  You will be contacted with the lab results as soon as they are available. The fastest way to get your results is to activate your My Chart account. Instructions are located on the last page of this paperwork. If you have not heard from us regarding the results in 2 weeks, please contact this office.      Community-Acquired Pneumonia, Adult Pneumonia is an infection of the lungs. There are different types of pneumonia. One type can develop while a person is in a hospital. A different type, called community-acquired pneumonia, develops in people who are not, or have not recently been, in the hospital or other health care facility. What are the causes? Pneumonia may be caused by bacteria, viruses, or funguses. Community-acquired pneumonia is often caused by Streptococcus pneumonia bacteria. These bacteria are often passed from one person to another by breathing in droplets from the cough or sneeze of an infected person. What increases the risk? The condition is more likely to develop in:  People who havechronic diseases, such as chronic obstructive pulmonary disease (COPD), asthma, congestive heart failure, cystic fibrosis, diabetes, or kidney disease.  People who haveearly-stage or late-stage HIV.  People who havesickle cell disease.  People who havehad their spleen removed (splenectomy).  People who havepoor dental hygiene.  People who havemedical conditions that increase the risk of breathing in (aspirating) secretions  their own mouth and nose.  People who havea weakened immune system (immunocompromised).  People who smoke.  People whotravel to areas where pneumonia-causing germs commonly exist.  People whoare around animal habitats or animals that have pneumonia-causing germs, including birds, bats, rabbits, cats, and farm animals. What are the signs or symptoms? Symptoms of this condition include:  Adry cough.  A wet (productive) cough.  Fever.  Sweating.  Chest pain, especially when breathing deeply or coughing.  Rapid breathing or difficulty breathing.  Shortness of breath.  Shaking chills.  Fatigue.  Muscle aches. How is this diagnosed? Your health care provider will take a medical history and perform a physical exam. You may also have other tests, including:  Imaging studies of your chest, including X-rays.  Tests to check your blood oxygen level and other blood gases.  Other tests on blood, mucus (sputum), fluid around your lungs (pleural fluid), and urine. If your pneumonia is severe, other tests may be done to identify the specific cause of your illness. How is this treated? The type of treatment that you receive depends on many factors, such as the cause of your pneumonia, the medicines you take, and other medical conditions that you have. For most adults, treatment and recovery from pneumonia may occur at home. In some cases, treatment must happen in a hospital. Treatment may include:  Antibiotic medicines, if the pneumonia was caused by bacteria.  Antiviral medicines, if the pneumonia was caused by a virus.  Medicines that are given by mouth or through an IV tube.  Oxygen.  Respiratory therapy. Although rare, treating severe pneumonia may include:  Mechanical ventilation. This is done if you are not breathing well on your   own and you cannot maintain a safe blood oxygen level.  Thoracentesis. This procedureremoves fluid around one lung or both lungs to help  you breathe better. Follow these instructions at home:  Take over-the-counter and prescription medicines only as told by your health care provider.  Only takecough medicine if you are losing sleep. Understand that cough medicine can prevent your body's natural ability to remove mucus from your lungs.  If you were prescribed an antibiotic medicine, take it as told by your health care provider. Do not stop taking the antibiotic even if you start to feel better.  Sleep in a semi-upright position at night. Try sleeping in a reclining chair, or place a few pillows under your head.  Do not use tobacco products, including cigarettes, chewing tobacco, and e-cigarettes. If you need help quitting, ask your health care provider.  Drink enough water to keep your urine clear or pale yellow. This will help to thin out mucus secretions in your lungs. How is this prevented? There are ways that you can decrease your risk of developing community-acquired pneumonia. Consider getting a pneumococcal vaccine if:  You are older than 65 years of age.  You are older than 66 years of age and are undergoing cancer treatment, have chronic lung disease, or have other medical conditions that affect your immune system. Ask your health care provider if this applies to you. There are different types and schedules of pneumococcal vaccines. Ask your health care provider which vaccination option is best for you. You may also prevent community-acquired pneumonia if you take these actions:  Get an influenza vaccine every year. Ask your health care provider which type of influenza vaccine is best for you.  Go to the dentist on a regular basis.  Wash your hands often. Use hand sanitizer if soap and water are not available. Contact a health care provider if:  You have a fever.  You are losing sleep because you cannot control your cough with cough medicine. Get help right away if:  You have worsening shortness of  breath.  You have increased chest pain.  Your sickness becomes worse, especially if you are an older adult or have a weakened immune system.  You cough up blood. This information is not intended to replace advice given to you by your health care provider. Make sure you discuss any questions you have with your health care provider. Document Released: 04/23/2005 Document Revised: 09/01/2015 Document Reviewed: 08/18/2014 Elsevier Interactive Patient Education  2017 Elsevier Inc.  

## 2016-05-26 ENCOUNTER — Ambulatory Visit (INDEPENDENT_AMBULATORY_CARE_PROVIDER_SITE_OTHER): Payer: 59 | Admitting: Family Medicine

## 2016-05-26 VITALS — BP 130/80 | HR 78 | Temp 98.3°F | Resp 17 | Ht 65.5 in | Wt 233.0 lb

## 2016-05-26 DIAGNOSIS — J181 Lobar pneumonia, unspecified organism: Secondary | ICD-10-CM | POA: Diagnosis not present

## 2016-05-26 DIAGNOSIS — E119 Type 2 diabetes mellitus without complications: Secondary | ICD-10-CM | POA: Diagnosis not present

## 2016-05-26 DIAGNOSIS — J441 Chronic obstructive pulmonary disease with (acute) exacerbation: Secondary | ICD-10-CM | POA: Diagnosis not present

## 2016-05-26 DIAGNOSIS — J189 Pneumonia, unspecified organism: Secondary | ICD-10-CM

## 2016-05-26 MED ORDER — ALBUTEROL SULFATE (2.5 MG/3ML) 0.083% IN NEBU
2.5000 mg | INHALATION_SOLUTION | Freq: Once | RESPIRATORY_TRACT | Status: AC
  Administered 2016-05-26: 2.5 mg via RESPIRATORY_TRACT

## 2016-05-26 MED ORDER — METHYLPREDNISOLONE SODIUM SUCC 125 MG IJ SOLR
125.0000 mg | Freq: Once | INTRAMUSCULAR | Status: AC
  Administered 2016-05-26: 125 mg via INTRAMUSCULAR

## 2016-05-26 MED ORDER — METFORMIN HCL 500 MG PO TABS: 500.0000 mg | ORAL_TABLET | Freq: Two times a day (BID) | ORAL | 1 refills | Status: DC

## 2016-05-26 MED ORDER — IPRATROPIUM BROMIDE 0.02 % IN SOLN
0.5000 mg | Freq: Once | RESPIRATORY_TRACT | Status: AC
  Administered 2016-05-26: 0.5 mg via RESPIRATORY_TRACT

## 2016-05-26 MED ORDER — PREDNISONE 50 MG PO TABS: 50.0000 mg | ORAL_TABLET | Freq: Every day | ORAL | 0 refills | Status: DC

## 2016-05-26 NOTE — Progress Notes (Signed)
Subjective:  By signing my name below, I, [name], attest that this documentation has been prepared under the direction and in the presence of [name], MD. Electronically Signed: [name], [place]. 05/26/2016 , 10:17 AM .  Patient was seen in Room 12 .   Patient ID: Susan [name], female    DOB: [birth year], 66 y.o.   MRN: [id number] Chief Complaint  Patient presents with  . Follow-up    pneumonia    HPI Susan [name] is a 66 y.o. female who presents to [name] for follow up on pneumonia. She was seen for COPD exacerbation that started about 2 weeks ago. Although, she had felt her symptoms from bronchitis 2 months ago never really resolved. She's been on Advair 1 puff twice a day throughout the day. She was treated with a course of prednisone and zpak without significant improvement at last visit. She had right lower lobe pneumonia and placed on doxycycline and home nebulizer.   Patient states that she was feeling "better", but still wheezing and breathing issues. Since the snow came down about 3 days ago, she didn't receive a call from [place] about her home nebulizer. She's been using her albuterol inhaler every 2~3 hours. She hasn't been coughing things up.   Her appetite has been less, even while on prednisone. She doesn't have a glucometer but uses her husband's if she has concern for high blood sugar. Her brother is at [facility] for leukemia and was given new medication to help with his immune system.   Past Medical History:  Diagnosis Date  . Anemia 01/28/2012  . Anxiety   . Cancer [facility]) 2004   ductal carcinoma; lumpectomy  . Cellulitis of chest wall 12/09/2015  . Complication of anesthesia    difficulty voiding after anesthesia  . COPD (chronic obstructive pulmonary disease) (HCC)    mild  . DDD (degenerative disc disease)   . Depression   . Diabetes mellitus without complication ([place])   . DJD (degenerative joint disease)   . Dysrhythmia    hx palpitations?panic  attack  . Fatty liver disease, nonalcoholic   . Fibromyalgia   . GERD (gastroesophageal reflux disease)   . Hip pain, right 10/12/2013  . History of blood clots 2004   during cancer treatment  . HTN (hypertension) 01/28/2012  . Hyperglycemia 01/28/2012  . Hyperlipidemia 01/28/2012  . Hypertension   . Hypothyroidism 02/14/2014  . Muscle spasm 12/30/2011  . Nasal polyp 08/30/2012  . Obesity   . Osteoarthritis   . Osteopenia   . Pain of left heel 01/28/2012  . Peripheral neuropathy ([place])   . Peripheral vascular disease ([place]) 04   axillary,upper chest after 3 weeks on tamoxifen  . Pneumonia    hx  . Preventative health care 01/28/2012  . Shortness of breath dyspnea    occ on exersion  . Sleep apnea    on CPAP  . Tobacco abuse-unspec 10/12/2013  . Tuberculosis    latent  finishing 9 months tx on last month   Prior to Admission medications   Medication Sig Start Date End Date Taking? Authorizing Provider  albuterol (PROVENTIL HFA;VENTOLIN HFA) 108 (90 BASE) MCG/ACT inhaler Inhale 2 puffs into the lungs every 4 (four) hours as needed for wheezing or shortness of breath (cough, shortness of breath or wheezing.). 08/09/13  Yes [name], MD  Ascorbic Acid (VITAMIN C PO) Take 1 tablet by mouth 3 (three) times daily.   Yes Historical Provider, MD  aspirin EC  81 MG tablet Take 81 mg by mouth daily.   Yes Historical Provider, MD  azithromycin (ZITHROMAX) 250 MG tablet Take 2 tabs PO x 1 dose, then 1 tab PO QD x 4 days 05/19/16  Yes [name], MD  chlorpheniramine-HYDROcodone [facility] PENNKINETIC ER) 10-8 MG/5ML SUER Take 5 mLs by mouth every 12 (twelve) hours as needed for cough. 05/19/16  Yes [name], MD  cholecalciferol (VITAMIN D) 1000 UNITS tablet Take 1,000 Units by mouth daily.   Yes Historical Provider, MD  cyclobenzaprine (FLEXERIL) 10 MG tablet Take 1 tablet (10 mg total) by mouth 3 (three) times daily as needed for muscle spasms. 01/25/16  Yes [name], MD  doxycycline (VIBRAMYCIN)  100 MG capsule Take 1 capsule (100 mg total) by mouth 2 (two) times daily. 05/22/16  Yes [name], MD  DULoxetine (CYMBALTA) 30 MG capsule *INSSURANCE ONLY ALLOWS 30* TAKE 3 CAPSULES (90 MG TOTAL) BY MOUTH DAILY. 03/20/16  Yes [name], MD  esomeprazole (NEXIUM) 20 MG capsule Take 20 mg by mouth daily at 12 noon.   Yes Historical Provider, MD  fenofibrate 160 MG tablet Take 1 tablet (160 mg total) by mouth daily. 06/28/15  Yes Gay Filler Copland, MD  Fluticasone-Salmeterol (ADVAIR) 100-50 MCG/DOSE AEPB Inhale 1 puff into the lungs 2 (two) times daily. 04/03/16  Yes Sedalia Muta, FNP  Guaifenesin [facility] MAXIMUM STRENGTH) 1200 MG TB12 Take 1 tablet (1,200 mg total) by mouth every 12 (twelve) hours as needed. 05/19/16  Yes [name], MD  hydrochlorothiazide (HYDRODIURIL) 25 MG tablet TAKE 1 TABLET (25 MG TOTAL) BY MOUTH DAILY. 01/25/16  Yes [name], MD  HYDROcodone-acetaminophen (NORCO/VICODIN) 5-325 MG tablet Take 1 tablet by mouth every 6 (six) hours as needed for severe pain. 01/25/16  Yes [name], MD  ibuprofen (ADVIL,MOTRIN) 200 MG tablet Take 800 mg by mouth at bedtime.   Yes Historical Provider, MD  ipratropium-albuterol (DUONEB) 0.5-2.5 (3) MG/3ML SOLN Take 3 mLs by nebulization 4 (four) times daily. 05/22/16  Yes [name], MD  levothyroxine (SYNTHROID, LEVOTHROID) 25 MCG tablet TAKE 1 TABLET (25 MCG TOTAL) BY MOUTH DAILY.**INS ALLOWS 30 DAYS ONLY** 03/14/16  Yes [name], MD  metFORMIN (GLUCOPHAGE) 500 MG tablet TAKE 1 TABLET (500 MG TOTAL) BY MOUTH 2 (TWO) TIMES DAILY WITH A MEAL. 03/20/16  Yes [name], MD  metoprolol (LOPRESSOR) 50 MG tablet TAKE 1 TABLET (50 MG TOTAL) BY MOUTH 2 (TWO) TIMES DAILY. 01/04/16  Yes [name], MD  Multiple Vitamins-Minerals (MULTIVITAMIN WITH MINERALS) tablet Take 1 tablet by mouth daily.   Yes Historical Provider, MD  predniSONE (DELTASONE) 50 MG tablet Take 1 tablet (50 mg total) by mouth daily with breakfast. 05/19/16  Yes [name], MD    Spacer/[facility] Use as directed with albuterol inhaler.  Please ensure pt receives whatever spacer works with her albuterol - looks like she prob has pro-ventil or ventolin 05/19/16  Yes [name], MD  temazepam (RESTORIL) 15 MG capsule Take 1 capsule (15 mg total) by mouth at bedtime as needed for sleep. 01/25/16  Yes [name], MD   Allergies  Allergen Reactions  . Chantix [Varenicline] Other (See Comments)    "Felt like having mental breakdown"  . Ciprofloxacin Other (See Comments)    Muscle tightness, tendon aching and pain  . Penicillins Anaphylaxis and Swelling  . Tamoxifen Other (See Comments)    Blood clots  .  Levaquin [Levofloxacin] Other (See Comments)    Severe tendon pain  . Lyrica [Pregabalin] Swelling  . Neurontin [Gabapentin] Swelling  . Morphine And Related Other (See Comments)    MORPHINE DRIP - causes pt to feel irritable and itch  . Ceclor [Cefaclor] Rash  . Other Other (See Comments)    Redness from tape and bandaides  . Sulfa Antibiotics Rash   Review of Systems  Constitutional: Negative for chills, fatigue, fever and unexpected weight change.  HENT: Positive for congestion.   Respiratory: Positive for cough and wheezing.   Gastrointestinal: Negative for constipation, diarrhea, nausea and vomiting.  Skin: Negative for rash and wound.  Neurological: Negative for dizziness, weakness and headaches.       Objective:   Physical Exam  Constitutional: She is oriented to person, place, and time. She appears well-developed and well-nourished. No distress.  HENT:  Head: Normocephalic and atraumatic.  Eyes: EOM are normal. Pupils are equal, round, and reactive to light.  Neck: Neck supple.  Cardiovascular: Normal rate.   Pulmonary/Chest: Effort normal. No respiratory distress. She has wheezes (Diffuse throughout with poor air movement).  Musculoskeletal: Normal range of motion.  Neurological: She is alert and oriented to person, place, and  time.  Skin: Skin is warm and dry.  Psychiatric: She has a normal mood and affect. Her behavior is normal.  Nursing note and vitals reviewed.   BP 130/80 (BP Location: Right Arm, Patient Position: Sitting, Cuff Size: Normal)   Pulse 78   Temp 98.3 F (36.8 C) (Oral)   Resp 17   Ht 5' 5.5" (1.664 m)   Wt 233 lb (105.7 kg)   SpO2 96%   BMI 38.18 kg/m     Assessment & Plan:   1. COPD exacerbation ([place])   2. Pneumonia of right lower lobe due to infectious organism ([place])   3. Controlled type 2 diabetes mellitus without complication, without long-term current use of insulin ([place])     Orders Placed This Encounter  Procedures  . DME Nebulizer machine    Order Specific Question:   Patient needs a nebulizer to treat with the following condition    Answer:   COPD with acute exacerbation [facility]) [id number]    Order Specific Question:   Patient needs a nebulizer to treat with the following condition    Answer:   Pneumonia [227785]  . Care order/instruction:    Scheduling Instructions:     Complete orders, AVS and go.    Meds ordered this encounter  Medications  . albuterol (PROVENTIL) (2.5 MG/3ML) 0.083% nebulizer solution 2.5 mg  . ipratropium (ATROVENT) nebulizer solution 0.5 mg  . methylPREDNISolone sodium succinate (SOLU-MEDROL) 125 mg/2 mL injection 125 mg  . predniSONE (DELTASONE) 50 MG tablet    Sig: Take 1 tablet (50 mg total) by mouth daily with breakfast.    Dispense:  4 tablet    Refill:  0  . metFORMIN (GLUCOPHAGE) 500 MG tablet    Sig: Take 1 tablet (500 mg total) by mouth 2 (two) times daily with a meal.    Dispense:  180 tablet    Refill:  1    I personally performed the services described in this documentation, which was scribed in my presence. The recorded information has been reviewed and considered, and addended by me as needed.   [name], M.D.  [facility] [place] [address] [place], [place] [address] [phone] phone [phone] fax  06/03/16 11:46 AM

## 2016-05-26 NOTE — Patient Instructions (Addendum)
Make sure you have the duoneb solution that was sent to [facility] to go into your nebulizer.  Use this 4 times a day.  You can use your albuterol in between if you need to.   Stay on the advair. Start the prednisone tomorrow. Finish the doxycycline. You should continue to get better every day.  You should feel better in 1 week and be done with the cough in 3 weeks. If you are getting worse or things persisting, please come back in for recheck. Your blood sugar is going to be really high with all of this steroid.    IF you received an x-ray today, you will receive an invoice from [facility]. Please contact [facility] at [phone] with questions or concerns regarding your invoice.   IF you received labwork today, you will receive an invoice from [place]. Please contact [facility] at [phone] with questions or concerns regarding your invoice.   Our billing staff will not be able to assist you with questions regarding bills from these companies.  You will be contacted with the lab results as soon as they are available. The fastest way to get your results is to activate your My Chart account. Instructions are located on the last page of this paperwork. If you have not heard from [place] regarding the results in 2 weeks, please contact this office.      Chronic Obstructive Pulmonary Disease Exacerbation Chronic obstructive pulmonary disease (COPD) is a common lung condition in which airflow from the lungs is limited. COPD is a general term that can be used to describe many different lung problems that limit airflow, including chronic bronchitis and emphysema. COPD exacerbations are episodes when breathing symptoms become much worse and require extra treatment. Without treatment, COPD exacerbations can be life threatening, and frequent COPD exacerbations can cause further damage to your lungs. What are the causes?  Respiratory infections.  Exposure to smoke.  Exposure to air  pollution, chemical fumes, or dust. Sometimes there is no apparent cause or trigger. What increases the risk?  Smoking cigarettes.  Older age.  Frequent prior COPD exacerbations. What are the signs or symptoms?  Increased coughing.  Increased thick spit (sputum) production.  Increased wheezing.  Increased shortness of breath.  Rapid breathing.  Chest tightness. How is this diagnosed? Your medical history, a physical exam, and tests will help your health care provider make a diagnosis. Tests may include:  A chest X-ray.  Basic lab tests.  Sputum testing.  An arterial blood gas test. How is this treated? Depending on the severity of your COPD exacerbation, you may need to be admitted to a hospital for treatment. Some of the treatments commonly used to treat COPD exacerbations are:  Antibiotic medicines.  Bronchodilators. These are drugs that expand the air passages. They may be given with an inhaler or nebulizer. Spacer devices may be needed to help improve drug delivery.  Corticosteroid medicines.  Supplemental oxygen therapy.  Airway clearing techniques, such as noninvasive ventilation (NIV) and positive expiratory pressure (PEP). These provide respiratory support through a mask or other noninvasive device. Follow these instructions at home:  Do not smoke. Quitting smoking is very important to prevent COPD from getting worse and exacerbations from happening as often.  Avoid exposure to all substances that irritate the airway, especially to tobacco smoke.  If you were prescribed an antibiotic medicine, finish it all even if you start to feel better.  Take all medicines as directed by your health care provider.It is important  to use correct technique with inhaled medicines.  Drink enough fluids to keep your urine clear or pale yellow (unless you have a medical condition that requires fluid restriction).  Use a cool mist vaporizer. This makes it easier to clear  your chest when you cough.  If you have a home nebulizer and oxygen, continue to use them as directed.  Maintain all necessary vaccinations to prevent infections.  Exercise regularly.  Eat a healthy diet.  Keep all follow-up appointments as directed by your health care provider. Get help right away if:  You have worsening shortness of breath.  You have trouble talking.  You have severe chest pain.  You have blood in your sputum.  You have a fever.  You have weakness, vomit repeatedly, or faint.  You feel confused.  You continue to get worse. This information is not intended to replace advice given to you by your health care provider. Make sure you discuss any questions you have with your health care provider. Document Released: 02/18/2007 Document Revised: 09/29/2015 Document Reviewed: 12/26/2012 Elsevier Interactive Patient Education  2017 [employer].

## 2016-05-31 ENCOUNTER — Telehealth: Payer: Self-pay

## 2016-05-31 ENCOUNTER — Encounter: Payer: Self-pay | Admitting: Family Medicine

## 2016-05-31 ENCOUNTER — Ambulatory Visit (INDEPENDENT_AMBULATORY_CARE_PROVIDER_SITE_OTHER): Payer: 59 | Admitting: Family Medicine

## 2016-05-31 VITALS — BP 126/71 | HR 90 | Temp 98.3°F | Wt 232.6 lb

## 2016-05-31 DIAGNOSIS — Z5181 Encounter for therapeutic drug level monitoring: Secondary | ICD-10-CM

## 2016-05-31 DIAGNOSIS — J441 Chronic obstructive pulmonary disease with (acute) exacerbation: Secondary | ICD-10-CM | POA: Diagnosis not present

## 2016-05-31 DIAGNOSIS — Z87891 Personal history of nicotine dependence: Secondary | ICD-10-CM | POA: Diagnosis not present

## 2016-05-31 DIAGNOSIS — J029 Acute pharyngitis, unspecified: Secondary | ICD-10-CM

## 2016-05-31 DIAGNOSIS — R590 Localized enlarged lymph nodes: Secondary | ICD-10-CM | POA: Diagnosis not present

## 2016-05-31 DIAGNOSIS — Z801 Family history of malignant neoplasm of trachea, bronchus and lung: Secondary | ICD-10-CM

## 2016-05-31 MED ORDER — NYSTATIN 100000 UNIT/ML MT SUSP: 5.0000 mL | Freq: Four times a day (QID) | OROMUCOSAL | 0 refills | Status: DC

## 2016-05-31 NOTE — Telephone Encounter (Signed)
Pt calling stating dr. Brigitte Pulse was going to send her prescription in from her visit today but just called the pharmacy and they have not received any prescriptions   Please advise

## 2016-05-31 NOTE — Telephone Encounter (Signed)
Spoke with dr. Brigitte Pulse and she has sent in mouth wash for pt and pt has been advised

## 2016-05-31 NOTE — Patient Instructions (Addendum)
   IF you received an x-ray today, you will receive an invoice from [facility]. Please contact [facility] at [phone] with questions or concerns regarding your invoice.   IF you received labwork today, you will receive an invoice from [facility]. Please contact [facility] at [phone] with questions or concerns regarding your invoice.   Our billing staff will not be able to assist you with questions regarding bills from these companies.  You will be contacted with the lab results as soon as they are available. The fastest way to get your results is to activate your My Chart account. Instructions are located on the last page of this paperwork. If you have not heard from us regarding the results in 2 weeks, please contact this office.     Chronic Obstructive Pulmonary Disease Exacerbation Chronic obstructive pulmonary disease (COPD) is a common lung condition in which airflow from the lungs is limited. COPD is a general term that can be used to describe many different lung problems that limit airflow, including chronic bronchitis and emphysema. COPD exacerbations are episodes when breathing symptoms become much worse and require extra treatment. Without treatment, COPD exacerbations can be life threatening, and frequent COPD exacerbations can cause further damage to your lungs. What are the causes?  Respiratory infections.  Exposure to smoke.  Exposure to air pollution, chemical fumes, or dust. Sometimes there is no apparent cause or trigger. What increases the risk?  Smoking cigarettes.  Older age.  Frequent prior COPD exacerbations. What are the signs or symptoms?  Increased coughing.  Increased thick spit (sputum) production.  Increased wheezing.  Increased shortness of breath.  Rapid breathing.  Chest tightness. How is this diagnosed? Your medical history, a physical exam, and tests will help your health care provider make a diagnosis. Tests may  include:  A chest X-ray.  Basic lab tests.  Sputum testing.  An arterial blood gas test.  How is this treated? Depending on the severity of your COPD exacerbation, you may need to be admitted to a hospital for treatment. Some of the treatments commonly used to treat COPD exacerbations are:  Antibiotic medicines.  Bronchodilators. These are drugs that expand the air passages. They may be given with an inhaler or nebulizer. Spacer devices may be needed to help improve drug delivery.  Corticosteroid medicines.  Supplemental oxygen therapy.  Airway clearing techniques, such as noninvasive ventilation (NIV) and positive expiratory pressure (PEP). These provide respiratory support through a mask or other noninvasive device.  Follow these instructions at home:  Do not smoke. Quitting smoking is very important to prevent COPD from getting worse and exacerbations from happening as often.  Avoid exposure to all substances that irritate the airway, especially to tobacco smoke.  If you were prescribed an antibiotic medicine, finish it all even if you start to feel better.  Take all medicines as directed by your health care provider.It is important to use correct technique with inhaled medicines.  Drink enough fluids to keep your urine clear or pale yellow (unless you have a medical condition that requires fluid restriction).  Use a cool mist vaporizer. This makes it easier to clear your chest when you cough.  If you have a home nebulizer and oxygen, continue to use them as directed.  Maintain all necessary vaccinations to prevent infections.  Exercise regularly.  Eat a healthy diet.  Keep all follow-up appointments as directed by your health care provider. Get help right away if:  You have worsening shortness of breath.    You have trouble talking.  You have severe chest pain.  You have blood in your sputum.  You have a fever.  You have weakness, vomit repeatedly, or  faint.  You feel confused.  You continue to get worse. This information is not intended to replace advice given to you by your health care provider. Make sure you discuss any questions you have with your health care provider. Document Released: 02/18/2007 Document Revised: 09/29/2015 Document Reviewed: 12/26/2012 Elsevier Interactive Patient Education  2017 Elsevier Inc.  

## 2016-05-31 NOTE — Progress Notes (Signed)
Subjective:    Patient ID: Susan [name], female    DOB: 05-14-1950, 66 y.o.   MRN: [id number] Chief Complaint  Patient presents with  . Follow-up    HPI  still a little hoarse Still has a place that hurts in there throat like something is stuck. Still doing neb Still coughing and starting to be productive of clear yellow but not coughingh al ot Feels tightness over chest. She has now finished the cough syrup and antibiotic Finished prednisone yesterday - on doxy trhough Fri First copd exac 3 years ago Not sleeping  Past Medical History:  Diagnosis Date  . Anemia 01/28/2012  . Anxiety   . Cancer [facility]) 2004   ductal carcinoma; lumpectomy  . Cellulitis of chest wall 12/09/2015  . Complication of anesthesia    difficulty voiding after anesthesia  . COPD (chronic obstructive pulmonary disease) (HCC)    mild  . DDD (degenerative disc disease)   . Depression   . Diabetes mellitus without complication ([place])   . DJD (degenerative joint disease)   . Dysrhythmia    hx palpitations?panic attack  . Fatty liver disease, nonalcoholic   . Fibromyalgia   . GERD (gastroesophageal reflux disease)   . Hip pain, right 10/12/2013  . History of blood clots 2004   during cancer treatment  . HTN (hypertension) 01/28/2012  . Hyperglycemia 01/28/2012  . Hyperlipidemia 01/28/2012  . Hypertension   . Hypothyroidism 02/14/2014  . Muscle spasm 12/30/2011  . Nasal polyp 08/30/2012  . Obesity   . Osteoarthritis   . Osteopenia   . Pain of left heel 01/28/2012  . Peripheral neuropathy ([place])   . Peripheral vascular disease ([place]) 04   axillary,upper chest after 3 weeks on tamoxifen  . Pneumonia    hx  . Preventative health care 01/28/2012  . Shortness of breath dyspnea    occ on exersion  . Sleep apnea    on CPAP  . Tobacco abuse-unspec 10/12/2013  . Tuberculosis    latent  finishing 9 months tx on last month   Past Surgical History:  Procedure Laterality Date  . ABDOMINAL HYSTERECTOMY   1991  . APPENDECTOMY  91  . BILATERAL OOPHORECTOMY  01/2003  . BREAST SURGERY Left 2004   lumpectomy  . CARPAL TUNNEL RELEASE     right  . INCISION AND DRAINAGE ABSCESS Left 12/13/2015   Procedure: DRAINAGE LEFT MASTECTOMY WOUND INFECTION;  Surgeon: Fanny Skates, MD;  Location: [facility];  Service: General;  Laterality: Left;  . JOINT REPLACEMENT     right total knee  . MASTECTOMY W/ SENTINEL NODE BIOPSY Right 10/26/2015  . MASTECTOMY W/ SENTINEL NODE BIOPSY Bilateral 10/26/2015   Procedure: RIGHT TOTAL MASTECTOMY WITH RIGHT SENTINEL LYMPH NODE BIOPSY, INJECT BLUE DYE RIGHT BREAST, LEFT BREAST PROPHYLACTIC MASTECTOMY;  Surgeon: Fanny Skates, MD;  Location: [facility];  Service: General;  Laterality: Bilateral;  . NASAL SEPTUM SURGERY  08  . PILONIDAL CYST EXCISION    . THYROIDECTOMY, PARTIAL  mid 80's   right side   Current Outpatient Prescriptions on File Prior to Visit  Medication Sig Dispense Refill  . albuterol (PROVENTIL HFA;VENTOLIN HFA) 108 (90 BASE) MCG/ACT inhaler Inhale 2 puffs into the lungs every 4 (four) hours as needed for wheezing or shortness of breath (cough, shortness of breath or wheezing.). 1 Inhaler 1  . Ascorbic Acid (VITAMIN C PO) Take 1 tablet by mouth 3 (three) times daily.    Marland Kitchen aspirin EC 81 MG tablet Take 81  mg by mouth daily.    . chlorpheniramine-HYDROcodone (TUSSIONEX PENNKINETIC ER) 10-8 MG/5ML SUER Take 5 mLs by mouth every 12 (twelve) hours as needed for cough. 120 mL 0  . cholecalciferol (VITAMIN D) 1000 UNITS tablet Take 1,000 Units by mouth daily.    . cyclobenzaprine (FLEXERIL) 10 MG tablet Take 1 tablet (10 mg total) by mouth 3 (three) times daily as needed for muscle spasms. 90 tablet 3  . doxycycline (VIBRAMYCIN) 100 MG capsule Take 1 capsule (100 mg total) by mouth 2 (two) times daily. 20 capsule 0  . DULoxetine (CYMBALTA) 30 MG capsule *INSSURANCE ONLY ALLOWS 30* TAKE 3 CAPSULES (90 MG TOTAL) BY MOUTH DAILY. 270 capsule 0  . esomeprazole (NEXIUM) 20 MG  capsule Take 20 mg by mouth daily at 12 noon.    . fenofibrate 160 MG tablet Take 1 tablet (160 mg total) by mouth daily. 90 tablet 3  . Fluticasone-Salmeterol (ADVAIR) 100-50 MCG/DOSE AEPB Inhale 1 puff into the lungs 2 (two) times daily. 1 each 3  . Guaifenesin (MUCINEX MAXIMUM STRENGTH) 1200 MG TB12 Take 1 tablet (1,200 mg total) by mouth every 12 (twelve) hours as needed. 14 tablet 1  . hydrochlorothiazide (HYDRODIURIL) 25 MG tablet TAKE 1 TABLET (25 MG TOTAL) BY MOUTH DAILY. 90 tablet 3  . ibuprofen (ADVIL,MOTRIN) 200 MG tablet Take 800 mg by mouth at bedtime.    Marland Kitchen ipratropium-albuterol (DUONEB) 0.5-2.5 (3) MG/3ML SOLN Take 3 mLs by nebulization 4 (four) times daily. 150 mL 1  . levothyroxine (SYNTHROID, LEVOTHROID) 25 MCG tablet TAKE 1 TABLET (25 MCG TOTAL) BY MOUTH DAILY.**INS ALLOWS 30 DAYS ONLY** 30 tablet 3  . metFORMIN (GLUCOPHAGE) 500 MG tablet Take 1 tablet (500 mg total) by mouth 2 (two) times daily with a meal. 180 tablet 1  . metoprolol (LOPRESSOR) 50 MG tablet TAKE 1 TABLET (50 MG TOTAL) BY MOUTH 2 (TWO) TIMES DAILY. 180 tablet 1  . Multiple Vitamins-Minerals (MULTIVITAMIN WITH MINERALS) tablet Take 1 tablet by mouth daily.    Marland Kitchen Spacer/Aero-Holding Dorise Bullion Use as directed with albuterol inhaler.  Please ensure pt receives whatever spacer works with her albuterol - looks like she prob has pro-ventil or ventolin 1 each 0  . temazepam (RESTORIL) 15 MG capsule Take 1 capsule (15 mg total) by mouth at bedtime as needed for sleep. 30 capsule 3  . HYDROcodone-acetaminophen (NORCO/VICODIN) 5-325 MG tablet Take 1 tablet by mouth every 6 (six) hours as needed for severe pain. 20 tablet 0   No current facility-administered medications on file prior to visit.    Allergies  Allergen Reactions  . Chantix [Varenicline] Other (See Comments)    "Felt like having mental breakdown"  . Ciprofloxacin Other (See Comments)    Muscle tightness, tendon aching and pain  . Penicillins Anaphylaxis  and Swelling  . Tamoxifen Other (See Comments)    Blood clots  . Levaquin [Levofloxacin] Other (See Comments)    Severe tendon pain  . Lyrica [Pregabalin] Swelling  . Neurontin [Gabapentin] Swelling  . Morphine And Related Other (See Comments)    MORPHINE DRIP - causes pt to feel irritable and itch  . Ceclor [Cefaclor] Rash  . Other Other (See Comments)    Redness from tape and bandaides  . Sulfa Antibiotics Rash   Family History  Problem Relation Age of Onset  . Cancer Mother 53    liver cancer, hep c  . Heart disease Mother     chf  . Hypertension Mother   . Hyperlipidemia  Mother   . Osteoporosis Mother   . Cancer Father 65    lung; smoker  . COPD Father   . Heart disease Sister     mvp  . Breast cancer Sister     dx. 70s  . Non-Hodgkin's lymphoma Sister 58    large B cell  . Hypertension Brother   . Arthritis Brother   . Kidney disease Brother   . Prostate cancer Brother     dx. early 77s  . Other Daughter     Beal's Syndrome  . Arthritis Daughter     Beal's  . Arthritis Son     Beal's connective tissue disease  . Vision loss Maternal Grandmother   . Dementia Maternal Grandmother   . Coronary artery disease Maternal Grandmother   . Heart Problems Maternal Grandmother   . Vision loss Paternal Grandfather   . Hypertension Brother   . Benign prostatic hyperplasia Brother   . Leukemia Brother     chronic lymphatic leukemia - small/B cell  . Hypertension Brother   . COPD Brother   . Prostate cancer Brother     dx. mid-60s  . Myelodysplastic syndrome Brother     dx. early 64s  . Hypertension Brother   . Hyperlipidemia Brother   . Prostate cancer Brother     dx. mid-70s  . Melanoma Brother     (x2) melanomas dx. late 60s-early 21s  . Mental illness Brother     schizophrenia  . Breast cancer Brother     dx. 63s  . Cirrhosis Brother     hep c  . Hypertension Sister   . Other Sister     thalessemia, anemia; hx of hysterectomy in her 96s  .  Hyperlipidemia Sister   . Gout Sister   . Diverticulitis Sister   . Other Daughter     overweight  . Breast cancer Maternal Aunt 61  . Lung cancer Cousin     maternal 1st cousin dx. 47 or younger; former smoker  . Colon cancer Cousin     maternal 1st cousin dx. late 30s-early 60s  . Cancer Cousin     maternal 1st cousin d. NOS cancer  . Emphysema Paternal Aunt   . Lung cancer Paternal Aunt     d. early 25s; smoker  . Leukemia Cousin     paternal 1st cousin d. early 31s  . Colon cancer Other 104    niece  . Melanoma Other     niece   Social History   Social History  . Marital status: Married    Spouse name: N/A  . Number of children: N/A  . Years of education: N/A   Social History Main Topics  . Smoking status: Former Smoker    Packs/day: 0.50    Years: 42.00    Types: Cigarettes    Quit date: 10/30/2015  . Smokeless tobacco: Never Used  . Alcohol use No  . Drug use: No  . Sexual activity: No   Other Topics Concern  . None   Social History Narrative  . None   Depression screen [facility] 2/9 05/31/2016 05/26/2016 05/22/2016 04/03/2016 01/25/2016  Decreased Interest 0 0 0 0 0  Down, Depressed, Hopeless 0 0 0 0 -  PHQ - 2 Score 0 0 0 0 0     Review of Systems  Constitutional: Positive for activity change, appetite change, chills, diaphoresis and fatigue. Negative for fever and unexpected weight change.  HENT: Positive for congestion, sore throat,  trouble swallowing and voice change. Negative for rhinorrhea, sinus pain and sinus pressure.   Respiratory: Positive for cough and chest tightness. Negative for shortness of breath and wheezing.   Cardiovascular: Positive for leg swelling. Negative for chest pain and palpitations.  Musculoskeletal: Positive for back pain.  Hematological: Negative for adenopathy.  Psychiatric/Behavioral: Positive for sleep disturbance.   See hpi    Objective:   Physical Exam  Constitutional: She is oriented to person, place, and time. She  appears well-developed and well-nourished. She appears lethargic. She appears ill. No distress.  HENT:  Head: Normocephalic and atraumatic.  Right Ear: Tympanic membrane, external ear and ear canal normal.  Left Ear: Tympanic membrane, external ear and ear canal normal.  Nose: Nose normal. No mucosal edema or rhinorrhea.  Mouth/Throat: Uvula is midline, oropharynx is clear and moist and mucous membranes are normal. No oropharyngeal exudate.  Eyes: Conjunctivae are normal. Right eye exhibits no discharge. Left eye exhibits no discharge. No scleral icterus.  Neck: Normal range of motion. Neck supple.  Cardiovascular: Normal rate, regular rhythm, normal heart sounds and intact distal pulses.   Pulmonary/Chest: Effort normal and breath sounds normal.  Abdominal: Soft. Bowel sounds are normal. She exhibits no distension and no mass. There is no tenderness. There is no rebound and no guarding.  Lymphadenopathy:    She has no cervical adenopathy.  Neurological: She is oriented to person, place, and time. She appears lethargic.  Skin: Skin is warm and dry. She is not diaphoretic. No erythema.  Psychiatric: She has a normal mood and affect. Her behavior is normal.   BP 126/71 (BP Location: Right Arm, Patient Position: Sitting, Cuff Size: Small)   Pulse 90   Temp 98.3 F (36.8 C) (Oral)   Wt 232 lb 9.6 oz (105.5 kg)   SpO2 97%   BMI 38.12 kg/m      Assessment & Plan:   1. COPD exacerbation ([place])   2. History of tobacco use disorder   3. Family history of lung cancer   4. Mediastinal adenopathy   5. Medication monitoring encounter   6. Acute pharyngitis, unspecified etiology     Orders Placed This Encounter  Procedures  . CT Chest Wo Contrast    EPIC ORDER WT. 235/ NO NEEDS/  INS- [employer] PDA/PATIENT  CAROLINE @[facility]  WILL GET AUTH     Standing Status:   Future    Number of Occurrences:   1    Standing Expiration Date:   07/29/2017    Order Specific Question:   Reason for Exam (SYMPTOM   OR DIAGNOSIS REQUIRED)    Answer:   persistent copd exacerbation and wheeze for sev mos, failed sev courses of antibiotic and steroids    Order Specific Question:   Preferred imaging location?    Answer:   [facility]  . CBC with Differential/Platelet  . Sedimentation Rate  . C-reactive protein  . Comprehensive metabolic panel    Meds ordered this encounter  Medications  . nystatin (MYCOSTATIN) 100000 UNIT/ML suspension    Sig: Take 5 mLs (500,000 Units total) by mouth 4 (four) times daily. Gargle in throat for as long as possible and then swallow.    Dispense:  200 mL    Refill:  0    [name], M.D.  Primary Care at [facility] [address] [place], [place] [address] [phone] phone [phone] fax  06/17/16 4:17 AM  Results for orders placed or performed in visit on 05/31/16  CBC with Differential/Platelet  Result Value Ref Range   WBC 10.6 3.4 - 10.8 x10E3/uL   RBC 4.56 3.77 - 5.28 x10E6/uL   Hemoglobin 12.9 11.1 - 15.9 g/dL   Hematocrit 38.0 34.0 - 46.6 %   MCV 83 79 - 97 fL   MCH 28.3 26.6 - 33.0 pg   MCHC 33.9 31.5 - 35.7 g/dL   RDW 14.2 12.3 - 15.4 %   Platelets 308 150 - 379 x10E3/uL   Neutrophils 37 Not Estab. %   Lymphs 51 Not Estab. %   Monocytes 9 Not Estab. %   Eos 1 Not Estab. %   Basos 0 Not Estab. %   Neutrophils Absolute 4.0 1.4 - 7.0 x10E3/uL   Lymphocytes Absolute 5.4 (H) 0.7 - 3.1 x10E3/uL   Monocytes Absolute 0.9 0.1 - 0.9 x10E3/uL   EOS (ABSOLUTE) 0.1 0.0 - 0.4 x10E3/uL   Basophils Absolute 0.0 0.0 - 0.2 x10E3/uL   Immature Granulocytes 2 Not Estab. %   Immature Grans (Abs) 0.2 (H) 0.0 - 0.1 x10E3/uL  Sedimentation Rate  Result Value Ref Range   Sed Rate 2 0 - 40 mm/hr  C-reactive protein  Result Value Ref Range   CRP 0.6 0.0 - 4.9 mg/L  Comprehensive metabolic panel  Result Value Ref Range   Glucose 112 (H) 65 - 99 mg/dL   BUN 19 8 - 27 mg/dL   Creatinine, Ser 0.78 0.57 - 1.00 mg/dL   GFR calc non Af Amer 80  >59 mL/min/1.73   GFR calc Af Amer 92 >59 mL/min/1.73   BUN/Creatinine Ratio 24 12 - 28   Sodium 141 134 - 144 mmol/L   Potassium 3.9 3.5 - 5.2 mmol/L   Chloride 97 96 - 106 mmol/L   CO2 26 18 - 29 mmol/L   Calcium 10.0 8.7 - 10.3 mg/dL   Total Protein 6.3 6.0 - 8.5 g/dL   Albumin 4.2 3.6 - 4.8 g/dL   Globulin, Total 2.1 1.5 - 4.5 g/dL   Albumin/Globulin Ratio 2.0 1.2 - 2.2   Bilirubin Total 0.2 0.0 - 1.2 mg/dL   Alkaline Phosphatase 59 39 - 117 IU/L   AST 19 0 - 40 IU/L   ALT 42 (H) 0 - 32 IU/L

## 2016-06-01 ENCOUNTER — Ambulatory Visit
Admission: RE | Admit: 2016-06-01 | Discharge: 2016-06-01 | Disposition: A | Discharge status: Home / Self Care | Payer: 59 | Source: Ambulatory Visit | Attending: Family Medicine | Admitting: Family Medicine

## 2016-06-01 DIAGNOSIS — Z87891 Personal history of nicotine dependence: Secondary | ICD-10-CM

## 2016-06-01 DIAGNOSIS — Z801 Family history of malignant neoplasm of trachea, bronchus and lung: Secondary | ICD-10-CM

## 2016-06-01 DIAGNOSIS — R590 Localized enlarged lymph nodes: Secondary | ICD-10-CM

## 2016-06-01 DIAGNOSIS — J441 Chronic obstructive pulmonary disease with (acute) exacerbation: Secondary | ICD-10-CM

## 2016-06-01 LAB — CBC WITH DIFFERENTIAL/PLATELET
BASOS: 0 %
Basophils Absolute: 0 10*3/uL (ref 0.0–0.2)
EOS (ABSOLUTE): 0.1 10*3/uL (ref 0.0–0.4)
EOS: 1 %
Hematocrit: 38 % (ref 34.0–46.6)
Hemoglobin: 12.9 g/dL (ref 11.1–15.9)
IMMATURE GRANS (ABS): 0.2 10*3/uL — AB (ref 0.0–0.1)
IMMATURE GRANULOCYTES: 2 %
LYMPHS: 51 %
Lymphocytes Absolute: 5.4 10*3/uL — ABNORMAL HIGH (ref 0.7–3.1)
MCH: 28.3 pg (ref 26.6–33.0)
MCHC: 33.9 g/dL (ref 31.5–35.7)
MCV: 83 fL (ref 79–97)
Monocytes Absolute: 0.9 10*3/uL (ref 0.1–0.9)
Monocytes: 9 %
NEUTROS PCT: 37 %
Neutrophils Absolute: 4 10*3/uL (ref 1.4–7.0)
PLATELETS: 308 10*3/uL (ref 150–379)
RBC: 4.56 x10E6/uL (ref 3.77–5.28)
RDW: 14.2 % (ref 12.3–15.4)
WBC: 10.6 10*3/uL (ref 3.4–10.8)

## 2016-06-01 LAB — COMPREHENSIVE METABOLIC PANEL
ALBUMIN: 4.2 g/dL (ref 3.6–4.8)
ALK PHOS: 59 IU/L (ref 39–117)
ALT: 42 IU/L — AB (ref 0–32)
AST: 19 IU/L (ref 0–40)
Albumin/Globulin Ratio: 2 (ref 1.2–2.2)
BUN/Creatinine Ratio: 24 (ref 12–28)
BUN: 19 mg/dL (ref 8–27)
Bilirubin Total: 0.2 mg/dL (ref 0.0–1.2)
CHLORIDE: 97 mmol/L (ref 96–106)
CO2: 26 mmol/L (ref 18–29)
CREATININE: 0.78 mg/dL (ref 0.57–1.00)
Calcium: 10 mg/dL (ref 8.7–10.3)
GFR calc Af Amer: 92 mL/min/{1.73_m2} (ref >59)
GFR calc non Af Amer: 80 mL/min/{1.73_m2} (ref >59)
GLUCOSE: 112 mg/dL — AB (ref 65–99)
Globulin, Total: 2.1 g/dL (ref 1.5–4.5)
Potassium: 3.9 mmol/L (ref 3.5–5.2)
Sodium: 141 mmol/L (ref 134–144)
Total Protein: 6.3 g/dL (ref 6.0–8.5)

## 2016-06-01 LAB — SEDIMENTATION RATE: SED RATE: 2 mm/h (ref 0–40)

## 2016-06-01 LAB — C-REACTIVE PROTEIN: CRP: 0.6 mg/L (ref 0.0–4.9)

## 2016-06-08 NOTE — Telephone Encounter (Signed)
Last refill 9/20`17 05/31/16 last ov I will call [facility] again

## 2016-06-08 NOTE — Telephone Encounter (Signed)
lmtcb at [employer]

## 2016-06-08 NOTE — Telephone Encounter (Signed)
PT req. Refill on HYDROcodone-acetaminophen (NORCO/VICODIN) 5-325 MG tablet  Please call when ready for pick up [phone]

## 2016-06-09 NOTE — Telephone Encounter (Signed)
Ok for hydrocodone refill. Please let her know that she can pick up this up at [facility]

## 2016-06-11 NOTE — Telephone Encounter (Signed)
Patient informed. 

## 2016-06-13 MED ORDER — HYDROCODONE-ACETAMINOPHEN 5-325 MG PO TABS: 1.0000 | ORAL_TABLET | Freq: Four times a day (QID) | ORAL | 0 refills | Status: DC | PRN

## 2016-06-25 ENCOUNTER — Ambulatory Visit (INDEPENDENT_AMBULATORY_CARE_PROVIDER_SITE_OTHER): Payer: 59 | Admitting: Family Medicine

## 2016-06-25 ENCOUNTER — Encounter: Payer: Self-pay | Admitting: Family Medicine

## 2016-06-25 VITALS — BP 139/74 | HR 70 | Temp 98.6°F | Resp 18 | Ht 65.5 in | Wt 235.8 lb

## 2016-06-25 DIAGNOSIS — N764 Abscess of vulva: Secondary | ICD-10-CM

## 2016-06-25 MED ORDER — MUPIROCIN 2 % EX OINT: 1.0000 "application " | TOPICAL_OINTMENT | Freq: Four times a day (QID) | CUTANEOUS | 0 refills | Status: DC

## 2016-06-25 MED ORDER — DOXYCYCLINE HYCLATE 100 MG PO CAPS: 100.0000 mg | ORAL_CAPSULE | Freq: Two times a day (BID) | ORAL | 0 refills | Status: DC

## 2016-06-25 NOTE — Progress Notes (Signed)
Subjective:    Patient ID: Susan [name], female    DOB: 09/27/1950, 66 y.o.   MRN: [id number] Chief Complaint  Patient presents with  . Wound Infection    Cyst on L labia.     HPI  Noted a sore area on her left labia 3d ago that got much worse. Very painful to sit, wipe, urinate.  Is draining some pus.    Allergies  Allergen Reactions  . Chantix [Varenicline] Other (See Comments)    "Felt like having mental breakdown"  . Ciprofloxacin Other (See Comments)    Muscle tightness, tendon aching and pain  . Penicillins Anaphylaxis and Swelling  . Tamoxifen Other (See Comments)    Blood clots  . Levaquin [Levofloxacin] Other (See Comments)    Severe tendon pain  . Lyrica [Pregabalin] Swelling  . Neurontin [Gabapentin] Swelling  . Morphine And Related Other (See Comments)    MORPHINE DRIP - causes pt to feel irritable and itch  . Ceclor [Cefaclor] Rash  . Other Other (See Comments)    Redness from tape and bandaides  . Sulfa Antibiotics Rash    Review of Systems See hpi    Objective:   Physical Exam  Constitutional: She is oriented to person, place, and time. She appears well-developed and well-nourished. No distress.  HENT:  Head: Normocephalic and atraumatic.  Right Ear: External ear normal.  Eyes: Conjunctivae are normal. No scleral icterus.  Pulmonary/Chest: Effort normal.  Genitourinary: There is no rash, tenderness or lesion on the right labia. There is rash, tenderness and lesion on the left labia.  Neurological: She is alert and oriented to person, place, and time.  Skin: Skin is warm and dry. She is not diaphoretic. No erythema.  Psychiatric: She has a normal mood and affect. Her behavior is normal.   Verbal consent contained after review of risks/benefits of procedures.  Area cleaned with betadine x 2.  Anesthesia with approx 4cc of subcutaneous 2% lidocaine with epi with good result.  1/4 cm incision made into center of abscess w/ 11 blade and expressed a  small amount of purulent drainage followed by copious amount of sanguineous drainage. Abscess explored with hemostats and no loculations found. Pt tolerated procedure well. No comp. EBL <3cc.     BP 139/74   Pulse 70   Temp 98.6 F (37 C) (Oral)   Resp 18   Ht 5' 5.5" (1.664 m)   Wt 235 lb 12.8 oz (107 kg)   SpO2 96%   BMI 38.64 kg/m   Assessment & Plan:   1. Left genital labial abscess   s/p I&D, not large enough to pack [name] freq sitz baths and warm compresses. rtc if worsens or recurs.  Orders Placed This Encounter  Procedures  . WOUND CULTURE    Order Specific Question:   Source    Answer:   left labia  . Gram stain  . Genital culture    Meds ordered this encounter  Medications  . mupirocin ointment (BACTROBAN) 2 %    Sig: Apply 1 application topically 4 (four) times daily.    Dispense:  30 g    Refill:  0  . doxycycline (VIBRAMYCIN) 100 MG capsule    Sig: Take 1 capsule (100 mg total) by mouth 2 (two) times daily.    Dispense:  20 capsule    Refill:  0    [name], M.D.  Primary Care at [facility] [address] [place], [place] [address] (  [phone] phone [phone] fax  07/22/16 12:01 AM

## 2016-06-25 NOTE — Patient Instructions (Signed)
-You do not have a Bartholin Cyst or Abscess but the instructions for your abscess is the same.  Introduction A Bartholin cyst is a fluid-filled sac that forms on a Bartholin gland. Bartholin glands are small glands that are located within the folds of skin (labia) along the sides of the lower opening of the vagina. These glands produce a fluid to moisten the outside of the vagina during sexual intercourse. A Bartholin cyst causes a bulge on the side of the vagina. A cyst that is not large or infected may not cause symptoms or problems. However, if the fluid within the cyst becomes infected, the cyst can turn into an abscess. An abscess may cause discomfort or pain. What are the causes? A Bartholin cyst may develop when the duct of the gland becomes blocked. In many cases, the cause of this is not known. Various kinds of bacteria can cause the cyst to become infected and develop into an abscess. What increases the risk? You may be at an increased risk of developing a Bartholin cyst or abscess if:  You are a woman of reproductive age.  You have a history of previous Bartholin cysts or abscesses.  You have diabetes.  You have a sexually transmitted disease (STD). What are the signs or symptoms? The severity of symptoms varies depending on the size of the cyst and whether it is infected. Symptoms may include:  A bulge or swelling near the lower opening of your vagina.  Discomfort or pain.  Redness.  Pain during sexual intercourse.  Pain when walking.  Fluid draining from the area. How is this diagnosed? Your health care provider may make a diagnosis based on your symptoms and a physical exam. He or she will look for swelling in your vaginal area. Blood tests may be done to check for infections. A sample of fluid from the cyst or abscess may also be taken to be tested in a lab. How is this treated? Small cysts that are not infected may not require any treatment. These often go away on  their own. Yourhealth care provider will recommend hot baths and the use of warm compresses. These may also be part of the treatment for an abscess. Treatment options for a large cyst or abscess may include:  Antibiotic medicine.  A surgical procedure to drain the abscess. One of the following procedures may be done:  Incision and drainage. An incision is made in the cyst or abscess so that the fluid drains out. A catheter may be placed inside the cyst so that it does not close and fill up with fluid again. The catheter will be removed after you have a follow-up visit with a specialist (gynecologist).  Marsupialization. The cyst or abscess is opened and kept open by stitching the edges of the skin to the walls of the cyst or abscess. This allows it to continue to drain and not fill up with fluid again. If you have cysts or abscesses that keep returning and have required incision and drainage multiple times, your health care provider may talk to you about surgery to remove the Bartholin gland. Follow these instructions at home:  Take medicines only as directed by your health care provider.  If you were prescribed an antibiotic medicine, finish it all even if you start to feel better.  Apply warm, wet compresses to the area or take warm, shallow baths that cover your pelvic region (sitz baths) several times a day or as directed by your health care provider.  Do not squeeze the cyst or apply heavy pressure to it.  Do not have sexual intercourse until the cyst has gone away.  If your cyst or abscess was opened, a small piece of gauze or a drain may have been placed in the area to allow drainage. Do not remove the gauze or the drain until directed by your health care provider.  Wear feminine pads-not tampons-as needed for any drainage or bleeding.  Keep all follow-up visits as directed by your health care provider. This is important. How is this prevented? Take these steps to help prevent a  Bartholin cyst from returning:  Practice good hygiene.  Clean your vaginal area with mild soap and a soft cloth when you bathe.  Practice safe sex to prevent STDs. Contact a health care provider if:  You have increased pain, swelling, or redness in the area of the cyst.  Puslike drainage is coming from the cyst.  You have a fever. This information is not intended to replace advice given to you by your health care provider. Make sure you discuss any questions you have with your health care provider. Document Released: 04/23/2005 Document Revised: 09/29/2015 Document Reviewed: 12/07/2013  2017 Elsevier

## 2016-06-28 LAB — GRAM STAIN

## 2016-06-28 LAB — GENITAL CULTURE

## 2016-06-30 ENCOUNTER — Other Ambulatory Visit: Payer: Self-pay | Admitting: Family Medicine

## 2016-06-30 DIAGNOSIS — I10 Essential (primary) hypertension: Secondary | ICD-10-CM

## 2016-07-04 ENCOUNTER — Other Ambulatory Visit: Payer: Self-pay | Admitting: Family Medicine

## 2016-07-04 DIAGNOSIS — M797 Fibromyalgia: Secondary | ICD-10-CM

## 2016-07-04 NOTE — Telephone Encounter (Signed)
Pt states that she runs out of duloxetine on Friday and would like to know if Dr. Brigitte Pulse can call the order in soon.  [phone]

## 2016-07-04 NOTE — Telephone Encounter (Signed)
Says office visit prior to refill on prescription.  However, 01/2016 list Cymbalta for one year so will go ahead and refill.

## 2016-07-06 ENCOUNTER — Telehealth: Payer: Self-pay | Admitting: Family Medicine

## 2016-07-06 ENCOUNTER — Other Ambulatory Visit: Payer: Self-pay | Admitting: Family Medicine

## 2016-07-06 DIAGNOSIS — E038 Other specified hypothyroidism: Secondary | ICD-10-CM

## 2016-07-06 DIAGNOSIS — E785 Hyperlipidemia, unspecified: Secondary | ICD-10-CM

## 2016-07-06 NOTE — Telephone Encounter (Signed)
Pt requesting refill on Cymbalta. Last OV 2/19 for a different issue.

## 2016-07-06 NOTE — Telephone Encounter (Signed)
A 3 month supply was sent in 2d prior by [place] as this falls under the guidelines of the refill protocol.  Perhaps pt was not aware that this was sent in or was this an old message?

## 2016-07-06 NOTE — Telephone Encounter (Signed)
Pt calling for a refill/auth on Cymbalta. States that she was just in the clinic and is currently out of this medication. I let her know that I can only send it for review. She requested a call back, but is aware that it is usually the pharmacy that will give her a call.

## 2016-07-07 ENCOUNTER — Other Ambulatory Visit: Payer: Self-pay | Admitting: Family Medicine

## 2016-07-07 DIAGNOSIS — E785 Hyperlipidemia, unspecified: Secondary | ICD-10-CM

## 2016-07-07 NOTE — Telephone Encounter (Signed)
Lm for pt

## 2016-07-14 ENCOUNTER — Other Ambulatory Visit: Payer: Self-pay | Admitting: Family Medicine

## 2016-07-14 DIAGNOSIS — E785 Hyperlipidemia, unspecified: Secondary | ICD-10-CM

## 2016-07-24 ENCOUNTER — Other Ambulatory Visit: Payer: Self-pay | Admitting: Family Medicine

## 2016-07-24 NOTE — Telephone Encounter (Signed)
Pt calling for a refill on her Vicodin she states that she will be going out of town on Saturday 24th please call patient on her cell

## 2016-07-25 NOTE — Telephone Encounter (Signed)
06/13/16 last refill 06/2015 last ov

## 2016-07-26 NOTE — Telephone Encounter (Signed)
PT CALLED ABOUT THE SAME MESSAGE SHE NEEDS A REFILL ON HER VICODIN AND THAT SHE IS GOING OUT OF TOWN Saturday  PLEASE ADVISE: [phone]

## 2016-07-27 MED ORDER — HYDROCODONE-ACETAMINOPHEN 5-325 MG PO TABS: 1.0000 | ORAL_TABLET | Freq: Four times a day (QID) | ORAL | 0 refills | Status: DC | PRN

## 2016-07-27 NOTE — Telephone Encounter (Signed)
Refilled, please let pt know ready for pick up

## 2016-07-27 NOTE — Telephone Encounter (Signed)
l/m rx up front for pick up

## 2016-08-24 ENCOUNTER — Other Ambulatory Visit: Payer: Self-pay | Admitting: Family Medicine

## 2016-08-25 NOTE — Telephone Encounter (Signed)
Called to [facility] 

## 2016-09-28 ENCOUNTER — Other Ambulatory Visit: Payer: Self-pay | Admitting: Physician Assistant

## 2016-09-28 DIAGNOSIS — M797 Fibromyalgia: Secondary | ICD-10-CM

## 2016-11-09 ENCOUNTER — Other Ambulatory Visit: Payer: Self-pay | Admitting: Family Medicine

## 2016-11-09 DIAGNOSIS — E038 Other specified hypothyroidism: Secondary | ICD-10-CM

## 2016-11-10 NOTE — Telephone Encounter (Signed)
Pt is long-overdue for a f/u OV with FASTING labs. Please have her sched an appt within the next 2-3 months so we can continue to make sure the medicines are keeping her healthy and safe before we refill them further. Thanks.

## 2016-11-10 NOTE — Telephone Encounter (Signed)
mychart message sent to pt about making an appt °

## 2016-11-25 NOTE — Progress Notes (Signed)
Subjective:    Patient ID: Susan Susan [name], female    DOB: [birth year], 66 y.o.   MRN: [id number] Chief Complaint  Patient presents with  . Medication Refill    cymbalta, fenofibrate, HCTZ, levothyroxine    HPI  Susan Susan [name] is a delightful 66 yo woman who is here for Chronic disease management and medication refills.  She is now over 1 yr out from her right total mastectomy done 10/26/15!!! Which is also when she stopped smoking.   COPD, mild: Advair 100-50 bid, prn albuterol.  Currently breathing is doing well though worse during the heat.  H/o 40 pack yr tobbaco use quit 10/2015 Susan Susan [name] suspected that she qualified for the screening lung CT but didn't purse as pt needed more detailed imaging last yr. Did she??  Saw pulmonology Dr. Lamonte Sakai at [facility] 10 mos prior who recommended repeat chest CT scan in May 2018 and as long as it was stable then transitioning pt to the LDCT screening in the future.  However, patient had a prolonged COPD exacerbation this winter and Susan [name] we ultimately obtained a chest CT in late January and her workup which showed no acute findings and the lungs clear despite recent pneumonia.  hx latent TB treated 9 months.   DM w/ peripheral neuropathy: metformin 500 bid, asa 81. Does not check CBGs. Last optho eval neg for retinopathy 05/13/2015 - sees Dr[facility]ocum at [facility] Lab Results  Component Value Date   HGBA1C 6.1 01/25/2016   HGBA1C 6.0 (H) 10/18/2015   HGBA1C 6.1 07/28/2015   HGBA1C 6.1 (H) 04/20/2015   HGBA1C 6.3 (H) 12/20/2014    HTN: htz 25 and metoprolol 50 bid.  Checking outside office? Why on BB (work against inhaled beta-agonist) and not on acei/arb as indicated for DM.  HLD: fenofibrate 160 qd.  Not on statin??   Lab Results  Component Value Date   LDLCALC 75 09/24/2014   LDLCALC 84 02/02/2014   LDLCALC 59 03/02/2013  Does have fatty liver but was improving with last labs 6 mos prior due to patient's dietary changes. Chest CT done 05/2016 commented on prominent  hepatic steatosis. Lab Results  Component Value Date   ALT 42 (H) 05/31/2016   ALT 44 (H) 01/25/2016   ALT 29 12/09/2015   ALT 72 (H) 10/18/2015   ALT 53 09/15/2015     Vit C supp  Vit D supp mvi  Fibromyalgia: cymbalta 90 qd, flexeril 10 tid prn, ibuprofen prn?  lyrica and gabapentin caused swelling Susan [name] on allergy list Chronic pain with DDD:  Depression/anxiety: cymbalta 90 qd  GERD: Nexium/esomeprazole 20 qd  Hypothyroidism: levothyroxine 25 mcg qd. Had a cold nodule growth on the right thyroid Susan [name] that half was surgically removed in the mid-1980s.  Lab Results  Component Value Date   TSH 1.32 01/25/2016   TSH 0.84 07/28/2015   TSH 1.146 04/20/2015   OSA: Using cpap reg Insomnia: temazepam 15 qhs prn (dose was increased from 7.5 qhs prn last yr)  Wearing compression socks at night as feet cold and aching but they help a ton. Is taking the ibuprofen 800mg , cyclobenzaprine 10 qhs.  Gabapentin and lyrica caused swelling in hands. Pinched nerve throughout spine which presents at low back or neck, ucrrently cuasing low thoracic pain. Noe developing more slowly forming lumps whichg are intermittently painful.  Left hip pain paralyzing with pain when taking stairs or after 10 min of walking.  Past Medical History:  Diagnosis Date  . Anemia 01/28/2012  .  Anxiety   . Cancer [facility]) 2004   ductal carcinoma; lumpectomy  . Cellulitis of chest wall 12/09/2015  . Complication of anesthesia    difficulty voiding after anesthesia  . COPD (chronic obstructive pulmonary disease) (HCC)    mild  . DDD (degenerative disc disease)   . Depression   . Diabetes mellitus without complication ([place])   . DJD (degenerative joint disease)   . Dysrhythmia    hx palpitations?panic attack  . Fatty liver disease, nonalcoholic   . Fibromyalgia   . GERD (gastroesophageal reflux disease)   . Hip pain, right 10/12/2013  . History of blood clots 2004   during cancer treatment  . HTN (hypertension)  01/28/2012  . Hyperglycemia 01/28/2012  . Hyperlipidemia 01/28/2012  . Hypertension   . Hypothyroidism 02/14/2014  . Muscle spasm 12/30/2011  . Nasal polyp 08/30/2012  . Obesity   . Osteoarthritis   . Osteopenia   . Pain of left heel 01/28/2012  . Peripheral neuropathy   . Peripheral vascular disease ([place]) 04   axillary,upper chest after 3 weeks on tamoxifen  . Pneumonia    hx  . Preventative health care 01/28/2012  . Shortness of breath dyspnea    occ on exersion  . Sleep apnea    on CPAP  . Tobacco abuse-unspec 10/12/2013  . Tuberculosis    latent  finishing 9 months tx on last month   Past Surgical History:  Procedure Laterality Date  . ABDOMINAL HYSTERECTOMY  1991  . APPENDECTOMY  91  . BILATERAL OOPHORECTOMY  01/2003  . BREAST SURGERY Left 2004   lumpectomy  . CARPAL TUNNEL RELEASE     right  . INCISION AND DRAINAGE ABSCESS Left 12/13/2015   Procedure: DRAINAGE LEFT MASTECTOMY WOUND INFECTION;  Surgeon: Fanny Skates, MD;  Location: [place];  Service: General;  Laterality: Left;  . JOINT REPLACEMENT     right total knee  . MASTECTOMY W/ SENTINEL NODE BIOPSY Right 10/26/2015  . MASTECTOMY W/ SENTINEL NODE BIOPSY Bilateral 10/26/2015   Procedure: RIGHT TOTAL MASTECTOMY WITH RIGHT SENTINEL LYMPH NODE BIOPSY, INJECT BLUE DYE RIGHT BREAST, LEFT BREAST PROPHYLACTIC MASTECTOMY;  Surgeon: Fanny Skates, MD;  Location: [place];  Service: General;  Laterality: Bilateral;  . NASAL SEPTUM SURGERY  08  . PILONIDAL CYST EXCISION    . THYROIDECTOMY, PARTIAL  mid 80's   right side   Current Outpatient Prescriptions on File Prior to Visit  Medication Sig Dispense Refill  . albuterol (PROVENTIL HFA;VENTOLIN HFA) 108 (90 BASE) MCG/ACT inhaler Inhale 2 puffs into the lungs every 4 (four) hours as needed for wheezing or shortness of breath (cough, shortness of breath or wheezing.). 1 Inhaler 1  . Ascorbic Acid (VITAMIN C PO) Take 1 tablet by mouth 3 (three) times daily.    Marland Kitchen aspirin EC 81 MG  tablet Take 81 mg by mouth daily.    . cholecalciferol (VITAMIN D) 1000 UNITS tablet Take 1,000 Units by mouth daily.    Marland Kitchen esomeprazole (NEXIUM) 20 MG capsule Take 20 mg by mouth daily at 12 noon.    . Fluticasone-Salmeterol (ADVAIR) 100-50 MCG/DOSE AEPB Inhale 1 puff into the lungs 2 (two) times daily. 1 each 3  . HYDROcodone-acetaminophen (NORCO/VICODIN) 5-325 MG tablet Take 1 tablet by mouth every 6 (six) hours as needed for severe pain. 20 tablet 0  . ibuprofen (ADVIL,MOTRIN) 200 MG tablet Take 800 mg by mouth at bedtime.    . Multiple Vitamins-Minerals (MULTIVITAMIN WITH MINERALS) tablet Take 1 tablet by mouth  daily.    . mupirocin ointment (BACTROBAN) 2 % Apply 1 application topically 4 (four) times daily. 30 g 0  . temazepam (RESTORIL) 15 MG capsule TAKE ONE CAPSULE BY MOUTH AT BEDTIME AS NEEDED FOR SLEEP 30 capsule 3   No current facility-administered medications on file prior to visit.    Allergies  Allergen Reactions  . Chantix [Varenicline] Other (See Comments)    "Felt like having mental breakdown"  . Ciprofloxacin Other (See Comments)    Muscle tightness, tendon aching and pain  . Penicillins Anaphylaxis and Swelling  . Tamoxifen Other (See Comments)    Blood clots  . Levaquin [Levofloxacin] Other (See Comments)    Severe tendon pain  . Lyrica [Pregabalin] Swelling  . Neurontin [Gabapentin] Swelling  . Morphine And Related Other (See Comments)    MORPHINE DRIP - causes pt to feel irritable and itch  . Ceclor [Cefaclor] Rash  . Other Other (See Comments)    Redness from tape and bandaides  . Sulfa Antibiotics Rash   Family History  Problem Relation Age of Onset  . Cancer Mother 62       liver cancer, hep c  . Heart disease Mother        chf  . Hypertension Mother   . Hyperlipidemia Mother   . Osteoporosis Mother   . Cancer Father 59       lung; smoker  . COPD Father   . Heart disease Sister        mvp  . Breast cancer Sister        dx. 1s  . Non-Hodgkin's  lymphoma Sister 49       large B cell  . Hypertension Brother   . Arthritis Brother   . Kidney disease Brother   . Prostate cancer Brother        dx. early 74s  . Other Daughter        Beal's Syndrome  . Arthritis Daughter        Beal's  . Arthritis Son        Beal's connective tissue disease  . Vision loss Maternal Grandmother   . Dementia Maternal Grandmother   . Coronary artery disease Maternal Grandmother   . Heart Problems Maternal Grandmother   . Vision loss Paternal Grandfather   . Hypertension Brother   . Benign prostatic hyperplasia Brother   . Leukemia Brother        chronic lymphatic leukemia - small/B cell  . Hypertension Brother   . COPD Brother   . Prostate cancer Brother        dx. mid-60s  . Myelodysplastic syndrome Brother        dx. early 36s  . Hypertension Brother   . Hyperlipidemia Brother   . Prostate cancer Brother        dx. mid-70s  . Melanoma Brother        (x2) melanomas dx. late 60s-early 22s  . Mental illness Brother        schizophrenia  . Breast cancer Brother        dx. 25s  . Cirrhosis Brother        hep c  . Hypertension Sister   . Other Sister        thalessemia, anemia; hx of hysterectomy in her 63s  . Hyperlipidemia Sister   . Gout Sister   . Diverticulitis Sister   . Other Daughter        overweight  . Breast cancer Maternal  Aunt 46  . Lung cancer Cousin        maternal 1st cousin dx. 63 or younger; former smoker  . Colon cancer Cousin        maternal 1st cousin dx. late 51s-early 60s  . Cancer Cousin        maternal 1st cousin d. NOS cancer  . Emphysema Paternal Aunt   . Lung cancer Paternal Aunt        d. early 70s; smoker  . Leukemia Cousin        paternal 1st cousin d. early 9s  . Colon cancer Other 12       niece  . Melanoma Other        niece   Social History   Social History  . Marital status: Married    Spouse name: N/A  . Number of children: N/A  . Years of education: N/A   Social History Main  Topics  . Smoking status: Former Smoker    Packs/day: 0.50    Years: 42.00    Types: Cigarettes    Quit date: 10/30/2015  . Smokeless tobacco: Never Used  . Alcohol use No  . Drug use: No  . Sexual activity: No   Other Topics Concern  . None   Social History Narrative  . None   Depression screen [facility] 2/9 11/26/2016 06/25/2016 05/31/2016 05/26/2016 05/22/2016  Decreased Interest 0 0 0 0 0  Down, Depressed, Hopeless 0 0 0 0 0  PHQ - 2 Score 0 0 0 0 0    Review of Systems See hpi    Objective:   Physical Exam  Constitutional: She is oriented to person, place, and time. She appears well-developed and well-nourished. No distress.  HENT:  Head: Normocephalic and atraumatic.  Right Ear: External ear normal.  Left Ear: External ear normal.  Eyes: Conjunctivae are normal. No scleral icterus.  Neck: Normal range of motion. Neck supple. No thyromegaly present.  Cardiovascular: Normal rate, regular rhythm, normal heart sounds and intact distal pulses.   Pulmonary/Chest: Effort normal and breath sounds normal. No respiratory distress.  Musculoskeletal: She exhibits no edema.  Lymphadenopathy:    She has no cervical adenopathy.  Neurological: She is alert and oriented to person, place, and time.  Skin: Skin is warm and dry. She is not diaphoretic. No erythema.  Numerous small mobile tender 1-2 cm round to oval subcutaneous masses over left>right anterior upper thigh, upper medial back, left>right upper lateral arm. No overlying skin changes  Psychiatric: She has a normal mood and affect. Her behavior is normal.      BP 129/61   Pulse 71   Temp 98 F (36.7 C) (Oral)   Resp 18   Ht 5' 5.5" (1.664 m)   Wt 225 lb (102.1 kg)   SpO2 96%   BMI 36.87 kg/m   Results for orders placed or performed in visit on 11/26/16  POCT glycosylated hemoglobin (Hb A1C)  Result Value Ref Range   Hemoglobin A1C 6.0         Assessment & Plan:  A1c, tsh, cmp, lipid, cbc Microalb, foot exam Needs  OPtho exam - last was 05/2015    [place] to confirm lipoma in left anterior thigh and upper back.  Sched CPE??? - long overdue Just turn 66 yo this year Susan [name] will need to repeat both pneumonia vaccines - start with the -13 valent but will wait to give until 2019 Susan [name] it is 5 years since prior 13-valent.  Overdue for colonoscopy but patient currently feeling overwhelmed with several referrals. Last colonoscopy in 2007 was completely normal Susan [name] okay to defer for several more months.  1. Essential hypertension   2. Chronic obstructive pulmonary disease, unspecified COPD type ([place]) - last PFTs 2 yrs ago, cont advair 100-50 bid and RTC for recheck PFTs in 6-8 wks  3. Hypothyroidism, unspecified type - Has been on very low dose levothyroxine. Could probably go off as would guess that remaining left lobe of thyroid is sufficiently functioning but due to history of cold thyroid nodule may be indicated to keep levothyroxine on board for growth suppression. Patient with continued complaints of fatigue Susan [name] will try increasing levothyroxine from 25-50 g but needs recheck TSH in 6-8 weeks before any further refills   4. Controlled type 2 diabetes mellitus with diabetic polyneuropathy, without long-term current use of insulin ([place]) - - refer to foot center for DM shoes. Patient agrees to make appointment with ophthalmology. Peripheral neuropathy significantly worsening Susan [name] will try amitriptyline qhs. Cont qhs compression hose. Dose limited due to potential interactions with Cymbalta and Flexeril.   5. Fatty liver disease, nonalcoholic - seen on CT  6. Gastroesophageal reflux disease, esophagitis presence not specified   7. Class 2 severe obesity due to excess calories with serious comorbidity and body mass index (BMI) of 38.0 to 38.9 in adult ([place])   8. Pure hypercholesterolemia - pt not sure why she is on fenofibrate rather than a statin but happy to try swtiching - reviewed need to watch LFTs since already minimally elev  with fatting liver and caution with muscle ache side effect due to FM but pt very eager to try statin due to known benefits. Stop fenofibrate, start pravastain. Recheck LFTs in 6-8 wks.  9. Fibromyalgia - cont cymbalta 90 and cyclobenzaprin 10 qhs, start amitriptyline 25 qhs.  10. Anxiety and depression   11. Greater trochanteric bursitis of left hip - try home exercises and return to clinic for cortisone injection   12. Fibromyalgia muscle pain   13. Other specified hypothyroidism   14.    Mass of subcuteneous tissue - [name] suspect these are benign lipomas but I do think we need to further evaluate them as has developed Susan [name] many of them recently and that they are tender, also odd that they are confined to a symmetric proximal location. Hopefully it is the lipomas causing tender points underneath them due to the increased soft tissue pressure in the setting of fibromyalgia but will further eval with soft tissue US but may need biopsy to confirm.  Orders Placed This Encounter  Procedures  . Comprehensive metabolic panel    Order Specific Question:   Has the patient fasted?    Answer:   Yes  . CBC with Differential/Platelet  . Microalbumin/Creatinine Ratio, Urine  . TSH  . Ambulatory referral to Podiatry    Referral Priority:   Routine    Referral Type:   Consultation    Referral Reason:   Specialty Services Required    Requested Specialty:   Podiatry    Number of Visits Requested:   1  . POCT glycosylated hemoglobin (Hb A1C)  . HM DIABETES FOOT EXAM    Meds ordered this encounter  Medications  . amitriptyline (ELAVIL) 25 MG tablet    Sig: Take 1 tablet (25 mg total) by mouth at bedtime.    Dispense:  30 tablet    Refill:  1  . pravastatin (PRAVACHOL) 40 MG tablet  Sig: Take 1 tablet (40 mg total) by mouth daily.    Dispense:  90 tablet    Refill:  0  . DULoxetine (CYMBALTA) 30 MG capsule    Sig: TAKE 3 CAPSULES (90 MG TOTAL) BY MOUTH DAILY.    Dispense:  270 capsule    Refill:  1    . cyclobenzaprine (FLEXERIL) 10 MG tablet    Sig: Take 1 tablet (10 mg total) by mouth 3 (three) times daily as needed for muscle spasms.    Dispense:  90 tablet    Refill:  3  . levothyroxine (SYNTHROID, LEVOTHROID) 50 MCG tablet    Sig: Take 1 tablet (50 mcg total) by mouth daily before breakfast.    Dispense:  30 tablet    Refill:  1  . hydrochlorothiazide (HYDRODIURIL) 25 MG tablet    Sig: TAKE 1 TABLET (25 MG TOTAL) BY MOUTH DAILY.    Dispense:  90 tablet    Refill:  3  . metFORMIN (GLUCOPHAGE) 500 MG tablet    Sig: Take 1 tablet (500 mg total) by mouth 2 (two) times daily with a meal.    Dispense:  180 tablet    Refill:  1  . metoprolol tartrate (LOPRESSOR) 50 MG tablet    Sig: Take 1 tablet (50 mg total) by mouth 2 (two) times daily.    Dispense:  180 tablet    Refill:  3    [name], M.D.  Primary Care at [facility] [address] [place], [place] [address] [phone] phone [phone] fax  11/27/16 4:20 PM

## 2016-11-26 ENCOUNTER — Encounter: Payer: Self-pay | Admitting: Family Medicine

## 2016-11-26 ENCOUNTER — Ambulatory Visit (INDEPENDENT_AMBULATORY_CARE_PROVIDER_SITE_OTHER): Payer: 59 | Admitting: Family Medicine

## 2016-11-26 VITALS — BP 129/61 | HR 71 | Temp 98.0°F | Resp 18 | Ht 65.5 in | Wt 225.0 lb

## 2016-11-26 DIAGNOSIS — K760 Fatty (change of) liver, not elsewhere classified: Secondary | ICD-10-CM | POA: Diagnosis not present

## 2016-11-26 DIAGNOSIS — M7062 Trochanteric bursitis, left hip: Secondary | ICD-10-CM

## 2016-11-26 DIAGNOSIS — E038 Other specified hypothyroidism: Secondary | ICD-10-CM | POA: Diagnosis not present

## 2016-11-26 DIAGNOSIS — K219 Gastro-esophageal reflux disease without esophagitis: Secondary | ICD-10-CM

## 2016-11-26 DIAGNOSIS — E1142 Type 2 diabetes mellitus with diabetic polyneuropathy: Secondary | ICD-10-CM | POA: Diagnosis not present

## 2016-11-26 DIAGNOSIS — E039 Hypothyroidism, unspecified: Secondary | ICD-10-CM | POA: Diagnosis not present

## 2016-11-26 DIAGNOSIS — F419 Anxiety disorder, unspecified: Secondary | ICD-10-CM

## 2016-11-26 DIAGNOSIS — Z6838 Body mass index (BMI) 38.0-38.9, adult: Secondary | ICD-10-CM

## 2016-11-26 DIAGNOSIS — F32A Depression, unspecified: Secondary | ICD-10-CM

## 2016-11-26 DIAGNOSIS — J449 Chronic obstructive pulmonary disease, unspecified: Secondary | ICD-10-CM

## 2016-11-26 DIAGNOSIS — I10 Essential (primary) hypertension: Secondary | ICD-10-CM

## 2016-11-26 DIAGNOSIS — E7800 Pure hypercholesterolemia, unspecified: Secondary | ICD-10-CM | POA: Diagnosis not present

## 2016-11-26 DIAGNOSIS — R229 Localized swelling, mass and lump, unspecified: Secondary | ICD-10-CM

## 2016-11-26 DIAGNOSIS — F329 Major depressive disorder, single episode, unspecified: Secondary | ICD-10-CM

## 2016-11-26 DIAGNOSIS — M797 Fibromyalgia: Secondary | ICD-10-CM

## 2016-11-26 LAB — POCT GLYCOSYLATED HEMOGLOBIN (HGB A1C): Hemoglobin A1C: 6

## 2016-11-26 MED ORDER — AMITRIPTYLINE HCL 25 MG PO TABS: 25.0000 mg | ORAL_TABLET | Freq: Every day | ORAL | 1 refills | Status: DC

## 2016-11-26 MED ORDER — PRAVASTATIN SODIUM 40 MG PO TABS: 40.0000 mg | ORAL_TABLET | Freq: Every day | ORAL | 0 refills | Status: DC

## 2016-11-26 NOTE — Patient Instructions (Addendum)
   IF you received an x-ray today, you will receive an invoice from [facility]. Please contact [facility] at [phone] with questions or concerns regarding your invoice.   IF you received labwork today, you will receive an invoice from [facility]. Please contact [facility] at [phone] with questions or concerns regarding your invoice.   Our billing staff will not be able to assist you with questions regarding bills from these companies.  You will be contacted with the lab results as soon as they are available. The fastest way to get your results is to activate your My Chart account. Instructions are located on the last page of this paperwork. If you have not heard from us regarding the results in 2 weeks, please contact this office.     Trochanteric Bursitis Trochanteric bursitis is a condition that causes hip pain. Trochanteric bursitis happens when fluid-filled sacs (bursae) in the hip get irritated. Normally these sacs absorb shock and help strong bands of tissue (tendons) in your hip glide smoothly over each other and over your hip bones. What are the causes? This condition results from increased friction between the hip bones and the tendons that go over them. This condition can happen if you:  Have weak hips.  Use your hip muscles too much (overuse).  Get hit in the hip.  What increases the risk? This condition is more likely to develop in:  Women.  Adults who are middle-aged or older.  People with arthritis or a spinal condition.  People with weak buttocks muscles (gluteal muscles).  People who have one leg that is shorter than the other.  People who participate in certain kinds of athletic activities, such as: ? Running sports, especially long-distance running. ? Contact sports, like football or martial arts. ? Sports in which falls may occur, like skiing.  What are the signs or symptoms? The main symptom of this condition is pain and  tenderness over the point of your hip. The pain may be:  Sharp and intense.  Dull and achy.  Felt on the outside of your thigh.  It may increase when you:  Lie on your side.  Walk or run.  Go up on stairs.  Sit.  Stand up after sitting.  Stand for long periods of time.  How is this diagnosed? This condition may be diagnosed based on:  Your symptoms.  Your medical history.  A physical exam.  Imaging tests, such as: ? X-rays to check your bones. ? An MRI or ultrasound to check your tendons and muscles.  During your physical exam, your health care provider will check the movement and strength of your hip. He or she may press on the point of your hip to check for pain. How is this treated? This condition may be treated by:  Resting.  Reducing your activity.  Avoiding activities that cause pain.  Using crutches, a cane, or a walker to decrease the strain on your hip.  Taking medicine to help with swelling.  Having medicine injected into the bursae to help with swelling.  Using ice, heat, and massage therapy for pain relief.  Physical therapy exercises for strength and flexibility.  Surgery (rare).  Follow these instructions at home: Activity  Rest.  Avoid activities that cause pain.  Return to your normal activities as told by your health care provider. Ask your health care provider what activities are safe for you. Managing pain, stiffness, and swelling  Take over-the-counter and prescription medicines only as told by your   health care provider.  If directed, apply heat to the injured area as told by your health care provider. ? Place a towel between your skin and the heat source. ? Leave the heat on for 20-30 minutes. ? Remove the heat if your skin turns bright red. This is especially important if you are unable to feel pain, heat, or cold. You may have a greater risk of getting burned.  If directed, apply ice to the injured area: ? Put ice in a  plastic bag. ? Place a towel between your skin and the bag. ? Leave the ice on for 20 minutes, 2-3 times a day. General instructions  If the affected leg is one that you use for driving, ask your health care provider when it is safe to drive.  Use crutches, a cane, or a walker as told by your health care provider.  If one of your legs is shorter than the other, get fitted for a shoe insert.  Lose weight if you are overweight. How is this prevented?  Wear supportive footwear that is appropriate for your sport.  If you have hip pain, start any new exercise or sport slowly.  Maintain physical fitness, including: ? Strength. ? Flexibility. Contact a health care provider if:  Your pain does not improve with 2-4 weeks. Get help right away if:  You develop severe pain.  You have a fever.  You develop increased redness over your hip.  You have a change in your bowel function or bladder function.  You cannot control the muscles in your feet. This information is not intended to replace advice given to you by your health care provider. Make sure you discuss any questions you have with your health care provider. Document Released: 05/31/2004 Document Revised: 12/28/2015 Document Reviewed: 04/08/2015 Elsevier Interactive Patient Education  2018 [employer]  Trochanteric Bursitis Rehab Ask your health care provider which exercises are safe for you. Do exercises exactly as told by your health care provider and adjust them as directed. It is normal to feel mild stretching, pulling, tightness, or discomfort as you do these exercises, but you should stop right away if you feel sudden pain or your pain gets worse.Do not begin these exercises until told by your health care provider. Stretching exercises These exercises warm up your muscles and joints and improve the movement and flexibility of your hip. These exercises also help to relieve pain and stiffness. Exercise A: Iliotibial band  stretch  1. Lie on your side with your left / right leg in the top position. 2. Bend your left / right knee and grab your ankle. 3. Slowly bring your knee back so your thigh is behind your body. 4. Slowly lower your knee toward the floor until you feel a gentle stretch on the outside of your left / right thigh. If you do not feel a stretch and your knee will not fall farther, place the heel of your other foot on top of your outer knee and pull your thigh down farther. 5. Hold this position for __________ seconds. 6. Slowly return to the starting position. Repeat __________ times. Complete this exercise __________ times a day. Strengthening exercises These exercises build strength and endurance in your hip and pelvis. Endurance is the ability to use your muscles for a long time, even after they get tired. Exercise B: Bridge ( hip extensors) 1. Lie on your back on a firm surface with your knees bent and your feet flat on the floor. 2. Tighten   your buttocks muscles and lift your buttocks off the floor until your trunk is level with your thighs. You should feel the muscles working in your buttocks and the back of your thighs. If this exercise is too easy, try doing it with your arms crossed over your chest. 3. Hold this position for __________ seconds. 4. Slowly return to the starting position. 5. Let your muscles relax completely between repetitions. Repeat __________ times. Complete this exercise __________ times a day. Exercise C: Squats ( knee extensors and  quadriceps) 1. Stand in front of a table, with your feet and knees pointing straight ahead. You may rest your hands on the table for balance but not for support. 2. Slowly bend your knees and lower your hips like you are going to sit in a chair. ? Keep your weight over your heels, not over your toes. ? Keep your lower legs upright so they are parallel with the table legs. ? Do not let your hips go lower than your knees. ? Do not bend  lower than told by your health care provider. ? If your hip pain increases, do not bend as low. 3. Hold this position for __________ seconds. 4. Slowly push with your legs to return to standing. Do not use your hands to pull yourself to standing. Repeat __________ times. Complete this exercise __________ times a day. Exercise D: Hip hike 1. Stand sideways on a bottom step. Stand on your left / right leg with your other foot unsupported next to the step. You can hold onto the railing or wall if needed for balance. 2. Keeping your knees straight and your torso square, lift your left / right hip up toward the ceiling. 3. Hold this position for __________ seconds. 4. Slowly let your left / right hip lower toward the floor, past the starting position. Your foot should get closer to the floor. Do not lean or bend your knees. Repeat __________ times. Complete this exercise __________ times a day. Exercise E: Single leg stand 1. Stand near a counter or door frame that you can hold onto for balance as needed. It is helpful to stand in front of a mirror for this exercise so you can watch your hip. 2. Squeeze your left / right buttock muscles then lift up your other foot. Do not let your left / right hip push out to the side. 3. Hold this position for __________ seconds. Repeat __________ times. Complete this exercise __________ times a day. This information is not intended to replace advice given to you by your health care provider. Make sure you discuss any questions you have with your health care provider. Document Released: 05/31/2004 Document Revised: 12/29/2015 Document Reviewed: 04/08/2015 Elsevier Interactive Patient Education  2018 Elsevier Inc.  

## 2016-11-27 LAB — CBC WITH DIFFERENTIAL/PLATELET
BASOS: 0 %
Basophils Absolute: 0 10*3/uL (ref 0.0–0.2)
EOS (ABSOLUTE): 0.4 10*3/uL (ref 0.0–0.4)
Eos: 5 %
HEMATOCRIT: 40.1 % (ref 34.0–46.6)
HEMOGLOBIN: 13.7 g/dL (ref 11.1–15.9)
IMMATURE GRANS (ABS): 0 10*3/uL (ref 0.0–0.1)
Immature Granulocytes: 0 %
LYMPHS: 55 %
Lymphocytes Absolute: 4 10*3/uL — ABNORMAL HIGH (ref 0.7–3.1)
MCH: 28.8 pg (ref 26.6–33.0)
MCHC: 34.2 g/dL (ref 31.5–35.7)
MCV: 84 fL (ref 79–97)
MONOCYTES: 6 %
Monocytes Absolute: 0.4 10*3/uL (ref 0.1–0.9)
NEUTROS ABS: 2.5 10*3/uL (ref 1.4–7.0)
Neutrophils: 34 %
Platelets: 268 10*3/uL (ref 150–379)
RBC: 4.75 x10E6/uL (ref 3.77–5.28)
RDW: 13.5 % (ref 12.3–15.4)
WBC: 7.2 10*3/uL (ref 3.4–10.8)

## 2016-11-27 LAB — COMPREHENSIVE METABOLIC PANEL
ALT: 55 IU/L — ABNORMAL HIGH (ref 0–32)
AST: 27 IU/L (ref 0–40)
Albumin/Globulin Ratio: 2 (ref 1.2–2.2)
Albumin: 4.5 g/dL (ref 3.6–4.8)
Alkaline Phosphatase: 82 IU/L (ref 39–117)
BILIRUBIN TOTAL: 0.2 mg/dL (ref 0.0–1.2)
BUN / CREAT RATIO: 15 (ref 12–28)
BUN: 12 mg/dL (ref 8–27)
CHLORIDE: 99 mmol/L (ref 96–106)
CO2: 26 mmol/L (ref 20–29)
CREATININE: 0.78 mg/dL (ref 0.57–1.00)
Calcium: 9.7 mg/dL (ref 8.7–10.3)
GFR calc Af Amer: 92 mL/min/{1.73_m2} (ref >59)
GFR calc non Af Amer: 80 mL/min/{1.73_m2} (ref >59)
GLOBULIN, TOTAL: 2.2 g/dL (ref 1.5–4.5)
GLUCOSE: 91 mg/dL (ref 65–99)
Potassium: 3.8 mmol/L (ref 3.5–5.2)
SODIUM: 141 mmol/L (ref 134–144)
Total Protein: 6.7 g/dL (ref 6.0–8.5)

## 2016-11-27 LAB — MICROALBUMIN / CREATININE URINE RATIO: CREATININE, UR: 23.7 mg/dL

## 2016-11-27 LAB — TSH: TSH: 1.11 u[IU]/mL (ref 0.450–4.500)

## 2016-11-27 MED ORDER — METFORMIN HCL 500 MG PO TABS: 500.0000 mg | ORAL_TABLET | Freq: Two times a day (BID) | ORAL | 1 refills | Status: DC

## 2016-11-27 MED ORDER — LEVOTHYROXINE SODIUM 50 MCG PO TABS: 50.0000 ug | ORAL_TABLET | Freq: Every day | ORAL | 1 refills | Status: DC

## 2016-11-27 MED ORDER — CYCLOBENZAPRINE HCL 10 MG PO TABS: 10.0000 mg | ORAL_TABLET | Freq: Three times a day (TID) | ORAL | 3 refills | Status: DC | PRN

## 2016-11-27 MED ORDER — HYDROCHLOROTHIAZIDE 25 MG PO TABS: ORAL_TABLET | ORAL | 3 refills | Status: DC

## 2016-11-27 MED ORDER — METOPROLOL TARTRATE 50 MG PO TABS: 50.0000 mg | ORAL_TABLET | Freq: Two times a day (BID) | ORAL | 3 refills | Status: DC

## 2016-11-27 MED ORDER — DULOXETINE HCL 30 MG PO CPEP: ORAL_CAPSULE | ORAL | 1 refills | Status: DC

## 2016-11-30 ENCOUNTER — Encounter: Payer: Self-pay | Admitting: Family Medicine

## 2016-11-30 ENCOUNTER — Ambulatory Visit (INDEPENDENT_AMBULATORY_CARE_PROVIDER_SITE_OTHER): Payer: 59 | Admitting: Family Medicine

## 2016-11-30 VITALS — BP 117/77 | HR 77 | Temp 98.1°F | Resp 18 | Ht 63.7 in | Wt 227.0 lb

## 2016-11-30 DIAGNOSIS — M7062 Trochanteric bursitis, left hip: Secondary | ICD-10-CM | POA: Diagnosis not present

## 2016-11-30 DIAGNOSIS — E039 Hypothyroidism, unspecified: Secondary | ICD-10-CM

## 2016-11-30 MED ORDER — TRIAMCINOLONE ACETONIDE 40 MG/ML IJ SUSP
40.0000 mg | Freq: Once | INTRAMUSCULAR | Status: AC
  Administered 2016-11-30: 40 mg via INTRA_ARTICULAR

## 2016-11-30 NOTE — Patient Instructions (Addendum)
Bursa Injection, Care After Refer to this sheet in the next few weeks. These instructions provide you with information about caring for yourself after your procedure. Your health care provider may also give you more specific instructions. Your treatment has been planned according to current medical practices, but problems sometimes occur. Call your health care provider if you have any problems or questions after your procedure. What can I expect after the procedure? After the procedure, it is common to have:  Soreness.  Warmth.  Swelling.  You may have more pain, swelling, and warmth than you did before the injection. This reaction may last for about one day. Follow these instructions at home: Bathing  If you were given a bandage (dressing), keep it dry until your health care provider says it can be removed. Ask your health care provider when you can start showering or taking a bath. Managing pain, stiffness, and swelling  If directed, apply ice to the injection area: ? Put ice in a plastic bag. ? Place a towel between your skin and the bag. ? Leave the ice on for 20 minutes, 2-3 times per day.  Do not apply heat.  Raise the injection area above the level of your heart while you are sitting or lying down. Activity  Avoid strenuous activities for as long as directed by your health care provider. Ask your health care provider when you can return to your normal activities. General instructions  Take medicines only as directed by your health care provider.  Do not take aspirin or other over-the-counter medicines unless your health care provider says you can.  Check your injection site every day for signs of infection. Watch for: ? Redness, swelling, or pain. ? Fluid, blood, or pus.  Follow your health care provider's instructions about dressing changes and removal. Contact a health care provider if:  You have symptoms at your injection site that last longer than two days after  your procedure.  You have redness, swelling, or pain in your injection area.  You have fluid, blood, or pus coming from your injection site.  You have warmth in your injection area.  You have a fever.  Your pain is not controlled with medicine. Get help right away if:  Your hip turns very red.  Your hip becomes very swollen.  Your hip pain is severe. This information is not intended to replace advice given to you by your health care provider. Make sure you discuss any questions you have with your health care provider. Document Released: 05/14/2014 Document Revised: 12/28/2015 Document Reviewed: 03/03/2014 Elsevier Interactive Patient Education  2018 [employer].     IF you received an x-ray today, you will receive an invoice from [facility]. Please contact [facility] at [phone] with questions or concerns regarding your invoice.   IF you received labwork today, you will receive an invoice from [place]. Please contact [facility] at [phone] with questions or concerns regarding your invoice.   Our billing staff will not be able to assist you with questions regarding bills from these companies.  You will be contacted with the lab results as soon as they are available. The fastest way to get your results is to activate your My Chart account. Instructions are located on the last page of this paperwork. If you have not heard from [place] regarding the results in 2 weeks, please contact this office.

## 2016-11-30 NOTE — Progress Notes (Signed)
Subjective:  By signing my name below, I, Susan [name], attest that this documentation has been prepared under the direction and in the presence of Susan Cheadle, MD. Electronically Signed: Moises [name], [place]. 11/30/2016 , 2:07 PM .  Patient was seen in Room 8 .   Patient ID: Susan [name], female    DOB: 09-28-1950, 66 y.o.   MRN: [id number] Chief Complaint  Patient presents with  . Hip Pain    left side- hip injection   HPI Susan [name] is a 66 y.o. female who presents to Primary Care at [facility] for left hip injection. Patient was seen earlier this week for new diagnosis of left trochanter bursitis. She was given home exercises and return today for cortisone injection. She has had injections done in her knees in the past.   Her TSH was still within range from 1.32 to 1.1. However, patient was on such a low dose and symptomatic, we elected to do a short term trial of dose increase with recheck in 6-8 weeks for which she is already scheduled.   Patient also mentions the amitriptyline has been working well, and not feeling foggy in the morning.   Past Medical History:  Diagnosis Date  . Anemia 01/28/2012  . Anxiety   . Cancer [facility]) 2004   ductal carcinoma; lumpectomy  . Cellulitis of chest wall 12/09/2015  . Complication of anesthesia    difficulty voiding after anesthesia  . COPD (chronic obstructive pulmonary disease) (HCC)    mild  . DDD (degenerative disc disease)   . Depression   . Diabetes mellitus without complication ([place])   . DJD (degenerative joint disease)   . Dysrhythmia    hx palpitations?panic attack  . Fatty liver disease, nonalcoholic   . Fibromyalgia   . GERD (gastroesophageal reflux disease)   . Hip pain, right 10/12/2013  . History of [name] clots 2004   during cancer treatment  . HTN (hypertension) 01/28/2012  . Hyperglycemia 01/28/2012  . Hyperlipidemia 01/28/2012  . Hypertension   . Hypothyroidism 02/14/2014  . Muscle spasm 12/30/2011  . Nasal  polyp 08/30/2012  . Obesity   . Osteoarthritis   . Osteopenia   . Pain of left heel 01/28/2012  . Peripheral neuropathy   . Peripheral vascular disease ([place]) 04   axillary,upper chest after 3 weeks on tamoxifen  . Pneumonia    hx  . Preventative health care 01/28/2012  . Shortness of breath dyspnea    occ on exersion  . Sleep apnea    on CPAP  . Tobacco abuse-unspec 10/12/2013  . Tuberculosis    latent  finishing 9 months tx on last month   Prior to Admission medications   Medication Sig Start Date End Date Taking? Authorizing Provider  albuterol (PROVENTIL HFA;VENTOLIN HFA) 108 (90 BASE) MCG/ACT inhaler Inhale 2 puffs into the lungs every 4 (four) hours as needed for wheezing or shortness of breath (cough, shortness of breath or wheezing.). 08/09/13   [name], MD  amitriptyline (ELAVIL) 25 MG tablet Take 1 tablet (25 mg total) by mouth at bedtime. 11/26/16   [name], MD  Ascorbic Acid (VITAMIN C PO) Take 1 tablet by mouth 3 (three) times daily.    [provider]  aspirin EC 81 MG tablet Take 81 mg by mouth daily.    [provider]  cholecalciferol (VITAMIN D) 1000 UNITS tablet Take 1,000 Units by mouth daily.    [provider]  cyclobenzaprine (FLEXERIL) 10 MG  tablet Take 1 tablet (10 mg total) by mouth 3 (three) times daily as needed for muscle spasms. 11/27/16   [name], MD  DULoxetine (CYMBALTA) 30 MG capsule TAKE 3 CAPSULES (90 MG TOTAL) BY MOUTH DAILY. 11/27/16   [name], MD  esomeprazole (NEXIUM) 20 MG capsule Take 20 mg by mouth daily at 12 noon.    [provider]  Fluticasone-Salmeterol (ADVAIR) 100-50 MCG/DOSE AEPB Inhale 1 puff into the lungs 2 (two) times daily. 04/03/16   [name], FNP  hydrochlorothiazide (HYDRODIURIL) 25 MG tablet TAKE 1 TABLET (25 MG TOTAL) BY MOUTH DAILY. 11/27/16   [name], MD  HYDROcodone-acetaminophen (NORCO/VICODIN) 5-325 MG tablet Take 1 tablet by mouth every 6 (six) hours as needed for  severe pain. 07/27/16   [name], MD  ibuprofen (ADVIL,MOTRIN) 200 MG tablet Take 800 mg by mouth at bedtime.    [provider]  levothyroxine (SYNTHROID, LEVOTHROID) 50 MCG tablet Take 1 tablet (50 mcg total) by mouth daily before breakfast. 11/27/16   [name], MD  metFORMIN (GLUCOPHAGE) 500 MG tablet Take 1 tablet (500 mg total) by mouth 2 (two) times daily with a meal. 11/27/16   [name], MD  metoprolol tartrate (LOPRESSOR) 50 MG tablet Take 1 tablet (50 mg total) by mouth 2 (two) times daily. 11/27/16   [name], MD  Multiple Vitamins-Minerals (MULTIVITAMIN WITH MINERALS) tablet Take 1 tablet by mouth daily.    [provider]  mupirocin ointment (BACTROBAN) 2 % Apply 1 application topically 4 (four) times daily. 06/25/16   [name], MD  pravastatin (PRAVACHOL) 40 MG tablet Take 1 tablet (40 mg total) by mouth daily. 11/26/16   [name], MD  temazepam (RESTORIL) 15 MG capsule TAKE ONE CAPSULE BY MOUTH AT BEDTIME AS NEEDED FOR SLEEP 08/24/16   [name], MD   Allergies  Allergen Reactions  . Chantix [Varenicline] Other (See Comments)    "Felt like having mental breakdown"  . Ciprofloxacin Other (See Comments)    Muscle tightness, tendon aching and pain  . Penicillins Anaphylaxis and Swelling  . Tamoxifen Other (See Comments)    [name] clots  . Levaquin [Levofloxacin] Other (See Comments)    Severe tendon pain  . Lyrica [Pregabalin] Swelling  . Neurontin [Gabapentin] Swelling  . Morphine And Related Other (See Comments)    MORPHINE DRIP - causes pt to feel irritable and itch  . Ceclor [Cefaclor] Rash  . Other Other (See Comments)    Redness from tape and bandaides  . Sulfa Antibiotics Rash   Review of Systems  Constitutional: Negative for chills, fatigue, fever and unexpected weight change.  Respiratory: Negative for cough.   Gastrointestinal: Negative for constipation, diarrhea, nausea and vomiting.  Musculoskeletal: Positive for arthralgias.    Skin: Negative for rash and wound.  Neurological: Negative for dizziness, weakness and headaches.       Objective:   Physical Exam  Constitutional: She is oriented to person, place, and time. She appears well-developed and well-nourished. No distress.  HENT:  Head: Normocephalic and atraumatic.  Eyes: Pupils are equal, round, and reactive to light. EOM are normal.  Neck: Neck supple.  Cardiovascular: Normal rate.   Pulmonary/Chest: Effort normal. No respiratory distress.  Musculoskeletal: Normal range of motion.  Neurological: She is alert and oriented to person, place, and time.  Skin: Skin is warm and dry.  Psychiatric: She has a normal mood and affect. Her behavior is  normal.  Nursing note and vitals reviewed.   BP 117/77 (BP Location: Right Arm, Patient Position: Sitting, Cuff Size: Normal)   Pulse 77   Temp 98.1 F (36.7 C) (Oral)   Resp 18   Ht 5' 3.7" (1.618 m)   Wt 227 lb (103 kg)   SpO2 96%   BMI 39.33 kg/m   Left trochanteric bursa injection done.  Pt positioned in right lateral recumbant position with knees and hips at a 90 deg angle. Point of maximal tenderness identified by palpation.  Skin cleansed with EtOH x 2 followed by betadine x 2. Anesthesia with ethyl chloride cold spray.  Injected with 40mg  of Kenalog and 5cc of 1% lidocaine using 20g 1 1/2 in needle at right angle to bone.  Pt tolerated procedure well. No comp. No EBL.     Assessment & Plan:   1. Greater trochanteric bursitis of left hip - tried initial injection today. Has home exercises.  2. Hypothyroidism, unspecified type - has increase to 50 mcg for several days - feeling fine. Has f/u appt sched on 9/20 to recheck.    Meds ordered this encounter  Medications  . triamcinolone acetonide (KENALOG-40) injection 40 mg    I personally performed the services described in this documentation, which was scribed in my presence. The recorded information has been reviewed and considered, and addended  by me as needed.   Susan [name], M.D.  Primary Care at [facility] [address] [place], [place] [address] [phone] phone [phone] fax  11/30/16 6:41 PM

## 2016-12-25 ENCOUNTER — Other Ambulatory Visit: Payer: Self-pay | Admitting: Family Medicine

## 2016-12-25 DIAGNOSIS — M797 Fibromyalgia: Secondary | ICD-10-CM

## 2017-01-01 ENCOUNTER — Ambulatory Visit
Admission: RE | Admit: 2017-01-01 | Discharge: 2017-01-01 | Disposition: A | Discharge status: Home / Self Care | Payer: Medicare Other | Source: Ambulatory Visit | Attending: Family Medicine | Admitting: Family Medicine

## 2017-01-01 DIAGNOSIS — R229 Localized swelling, mass and lump, unspecified: Secondary | ICD-10-CM

## 2017-01-09 ENCOUNTER — Encounter: Payer: Self-pay | Admitting: Podiatry

## 2017-01-09 ENCOUNTER — Ambulatory Visit (INDEPENDENT_AMBULATORY_CARE_PROVIDER_SITE_OTHER): Payer: Medicare Other | Admitting: Podiatry

## 2017-01-09 ENCOUNTER — Ambulatory Visit: Payer: 59

## 2017-01-09 VITALS — BP 140/74 | HR 73 | Resp 16

## 2017-01-09 DIAGNOSIS — E1140 Type 2 diabetes mellitus with diabetic neuropathy, unspecified: Secondary | ICD-10-CM | POA: Diagnosis not present

## 2017-01-09 DIAGNOSIS — M2041 Other hammer toe(s) (acquired), right foot: Secondary | ICD-10-CM

## 2017-01-09 DIAGNOSIS — G629 Polyneuropathy, unspecified: Secondary | ICD-10-CM

## 2017-01-09 DIAGNOSIS — E1149 Type 2 diabetes mellitus with other diabetic neurological complication: Secondary | ICD-10-CM

## 2017-01-09 NOTE — Progress Notes (Signed)
   Subjective:    Patient ID: Susan [name], female    DOB: [birth year], 66 y.o.   MRN: [id number]  HPI  Chief Complaint  Patient presents with  . diabetic foot care    pt in for diabetic shoes, pt stated 'no pain just having trouble with balance" A1C-6     Review of Systems  HENT: Positive for hearing loss.   Musculoskeletal: Positive for arthralgias and myalgias.  All other systems reviewed and are negative.      Objective:   Physical Exam        Assessment & Plan:

## 2017-01-09 NOTE — Progress Notes (Signed)
Subjective:    Patient ID: Susan [name], female   DOB: 66 y.o.   MRN: [id number]   HPI patient presents on referral from family physician with long-term history of neuropathy diabetes and some balance issues. She is concerned about digital deformity right redness and is concerned about long-term ulceration    Review of Systems  All other systems reviewed and are negative.       Objective:  Physical Exam  Constitutional: She appears well-developed and well-nourished.  Cardiovascular: Intact distal pulses.   Pulmonary/Chest: Effort normal.  Musculoskeletal: Normal range of motion.  Neurological: She is alert.  Skin: Skin is warm.  Nursing note and vitals reviewed.  vascular status intact with diminished sharp Dole vibratory and DTR reflexes. Patient is noted to have significant rigid digital contraction digits 2 right with redness around the head of the proximal phalanx and moderate forefoot structural imbalance with patient noted to have no current breakdown of skin. Patient does have good digital perfusion well oriented     Assessment:  At risk diabetic with significant neuropathic symptomatology and history of balance issues with digital deformity right       Plan:    H&P all conditions reviewed. At this point I have recommended extra-depth shoe to try to reduce pressure on the digits and hopefully provide for more plantar stability and help with balance issues. Also discussed the possibilities some day for balance bracing which may be necessary

## 2017-01-20 ENCOUNTER — Other Ambulatory Visit: Payer: Self-pay | Admitting: Family Medicine

## 2017-01-20 DIAGNOSIS — E038 Other specified hypothyroidism: Secondary | ICD-10-CM

## 2017-01-24 ENCOUNTER — Ambulatory Visit (INDEPENDENT_AMBULATORY_CARE_PROVIDER_SITE_OTHER): Payer: 59 | Admitting: Family Medicine

## 2017-01-24 ENCOUNTER — Encounter: Payer: Self-pay | Admitting: Family Medicine

## 2017-01-24 VITALS — BP 112/73 | HR 88 | Temp 98.3°F | Resp 18 | Ht 63.7 in | Wt 233.2 lb

## 2017-01-24 DIAGNOSIS — E039 Hypothyroidism, unspecified: Secondary | ICD-10-CM | POA: Diagnosis not present

## 2017-01-24 DIAGNOSIS — Z23 Encounter for immunization: Secondary | ICD-10-CM

## 2017-01-24 DIAGNOSIS — E7800 Pure hypercholesterolemia, unspecified: Secondary | ICD-10-CM | POA: Diagnosis not present

## 2017-01-24 DIAGNOSIS — E038 Other specified hypothyroidism: Secondary | ICD-10-CM | POA: Diagnosis not present

## 2017-01-24 DIAGNOSIS — Z5181 Encounter for therapeutic drug level monitoring: Secondary | ICD-10-CM

## 2017-01-24 DIAGNOSIS — J439 Emphysema, unspecified: Secondary | ICD-10-CM | POA: Diagnosis not present

## 2017-01-24 DIAGNOSIS — J069 Acute upper respiratory infection, unspecified: Secondary | ICD-10-CM

## 2017-01-24 MED ORDER — ALBUTEROL SULFATE HFA 108 (90 BASE) MCG/ACT IN AERS: 2.0000 | INHALATION_SPRAY | RESPIRATORY_TRACT | 3 refills | Status: DC | PRN

## 2017-01-24 MED ORDER — LEVOTHYROXINE SODIUM 50 MCG PO TABS: 50.0000 ug | ORAL_TABLET | Freq: Every day | ORAL | 0 refills | Status: DC

## 2017-01-24 MED ORDER — AMITRIPTYLINE HCL 25 MG PO TABS: 25.0000 mg | ORAL_TABLET | Freq: Every day | ORAL | 3 refills | Status: DC

## 2017-01-24 MED ORDER — IPRATROPIUM BROMIDE 0.03 % NA SOLN: 2.0000 | Freq: Four times a day (QID) | NASAL | 1 refills | Status: DC

## 2017-01-24 MED ORDER — FLUTICASONE PROPIONATE 50 MCG/ACT NA SUSP: 2.0000 | Freq: Every day | NASAL | 2 refills | Status: DC

## 2017-01-24 NOTE — Patient Instructions (Addendum)
Continue on the Mucinex. Use the albuterol nebulizer about twice a day for the next 3 or 4 days to make sure that the congestion doesn't settle in your lungs. Use the ipratropium nasal spray for the next 3-4 days and the Flonase nasal spray for the next 2 weeks.    IF you received an x-ray today, you will receive an invoice from [facility]. Please contact [facility] at [phone] with questions or concerns regarding your invoice.   IF you received labwork today, you will receive an invoice from [place]. Please contact [facility] at [phone] with questions or concerns regarding your invoice.   Our billing staff will not be able to assist you with questions regarding bills from these companies.  You will be contacted with the lab results as soon as they are available. The fastest way to get your results is to activate your My Chart account. Instructions are located on the last page of this paperwork. If you have not heard from [place] regarding the results in 2 weeks, please contact this office.    Lipoma A lipoma is a noncancerous (benign) tumor that is made up of fat cells. This is a very common type of soft-tissue growth. Lipomas are usually found under the skin (subcutaneous). They may occur in any tissue of the body that contains fat. Common areas for lipomas to appear include the back, shoulders, buttocks, and thighs. Lipomas grow slowly, and they are usually painless. Most lipomas do not cause problems and do not require treatment. What are the causes? The cause of this condition is not known. What increases the risk? This condition is more likely to develop in:  People who are 1-60 years old.  People who have a family history of lipomas.  What are the signs or symptoms? A lipoma usually appears as a small, round bump under the skin. It may feel soft or rubbery, but the firmness can vary. Most lipomas are not painful. However, a lipoma may become painful if it is  located in an area where it pushes on nerves. How is this diagnosed? A lipoma can usually be diagnosed with a physical exam. You may also have tests to confirm the diagnosis and to rule out other conditions. Tests may include:  Imaging tests, such as a CT scan or MRI.  Removal of a tissue sample to be looked at under a microscope (biopsy).  How is this treated? Treatment is not needed for small lipomas that are not causing problems. If a lipoma continues to get bigger or it causes problems, removal is often the best option. Lipomas can also be removed to improve appearance. Removal of a lipoma is usually done with a surgery in which the fatty cells and the surrounding capsule are removed. Most often, a medicine that numbs the area (local anesthetic) is used for this procedure. Follow these instructions at home:  Keep all follow-up visits as directed by your health care provider. This is important. Contact a health care provider if:  Your lipoma becomes larger or hard.  Your lipoma becomes painful, red, or increasingly swollen. These could be signs of infection or a more serious condition. This information is not intended to replace advice given to you by your health care provider. Make sure you discuss any questions you have with your health care provider. Document Released: 04/13/2002 Document Revised: 09/29/2015 Document Reviewed: 04/19/2014 Elsevier Interactive Patient Education  [employer].

## 2017-01-24 NOTE — Progress Notes (Signed)
Subjective:    Patient ID: Susan [name], female    DOB: November 02, 1950, 66 y.o.   MRN: [id number] Chief Complaint  Patient presents with  . Blood work    Follow-up  . Medication follow-up    Pt states new meds have been working great.  . Medication Refill    Amitriptyline 25 MG, Pravastatin 40 MG, Levothyroxine 50 MCG. Albuterol Inhaler    HPI  Susan [name] is a delightful 66 yo woman who is here for Chronic disease management and medication refills.  She is now over 1 yr out from her right total mastectomy done 10/26/15!!! Which is also when she stopped smoking.   COPD, mild: Advair 100-50 bid, prn albuterol.  Currently breathing is doing well though worse during the heat.  H/o 40 pack yr tobbaco use quit 10/2015 [name] I suspected that she qualified for the screening lung CT but didn't purse as pt needed more detailed imaging last yr. Did she??  Saw pulmonology Dr. Lamonte Sakai at [facility] 10 mos prior who recommended repeat chest CT scan in May 2018 and as long as it was stable then transitioning pt to the LDCT screening in the future.  However, patient had a prolonged COPD exacerbation this winter and [name] we ultimately obtained a chest CT in late January and her workup which showed no acute findings and the lungs clear despite recent pneumonia.  hx latent TB treated 9 months.   DM w/ peripheral neuropathy: metformin 500 bid, asa 81. Does not check CBGs. Last optho eval neg for retinopathy 05/13/2015 - sees Dr.Yocum at [facility] Lab Results  Component Value Date   HGBA1C 6.1 01/25/2016   HGBA1C 6.0 (H) 10/18/2015   HGBA1C 6.1 07/28/2015   HGBA1C 6.1 (H) 04/20/2015   HGBA1C 6.3 (H) 12/20/2014    HTN: htz 25 and metoprolol 50 bid.  Checking outside office? Why on BB (work against inhaled beta-agonist) and not on acei/arb as indicated for DM.  HLD: fenofibrate 160 qd.  Not on statin??   Lab Results  Component Value Date   LDLCALC 75 09/24/2014   LDLCALC 84 02/02/2014   LDLCALC 59 03/02/2013  Does have  fatty liver but was improving with last labs 6 mos prior due to patient's dietary changes. Chest CT done 05/2016 commented on prominent hepatic steatosis. Lab Results  Component Value Date   ALT 42 (H) 05/31/2016   ALT 44 (H) 01/25/2016   ALT 29 12/09/2015   ALT 72 (H) 10/18/2015   ALT 53 09/15/2015     Vit C supp  Vit D supp mvi  Fibromyalgia: cymbalta 90 qd, flexeril 10 tid prn, ibuprofen prn?  lyrica and gabapentin caused swelling [name] on allergy list Chronic pain with DDD:  Depression/anxiety: cymbalta 90 qd  GERD: Nexium/esomeprazole 20 qd  Hypothyroidism: levothyroxine 25 mcg qd. Had a cold nodule growth on the right thyroid [name] that half was surgically removed in the mid-1980s.  Lab Results  Component Value Date   TSH 1.32 01/25/2016   TSH 0.84 07/28/2015   TSH 1.146 04/20/2015   OSA: Using cpap reg Insomnia: temazepam 15 qhs prn (dose was increased from 7.5 qhs prn last yr)  Wearing compression socks at night as feet cold and aching but they help a ton. Is taking the ibuprofen 800mg , cyclobenzaprine 10 qhs.  Gabapentin and lyrica caused swelling in hands. Pinched nerve throughout spine which presents at low back or neck, ucrrently cuasing low thoracic pain. Noe developing more slowly forming lumps whichg are intermittently  painful.  Left hip pain paralyzing with pain when taking stairs or after 10 min of walking.  Past Medical History:  Diagnosis Date  . Anemia 01/28/2012  . Anxiety   . Cancer [facility]) 2004   ductal carcinoma; lumpectomy  . Cellulitis of chest wall 12/09/2015  . Complication of anesthesia    difficulty voiding after anesthesia  . COPD (chronic obstructive pulmonary disease) (HCC)    mild  . DDD (degenerative disc disease)   . Depression   . Diabetes mellitus without complication ([place])   . DJD (degenerative joint disease)   . Dysrhythmia    hx palpitations?panic attack  . Fatty liver disease, nonalcoholic   . Fibromyalgia   . GERD  (gastroesophageal reflux disease)   . Hip pain, right 10/12/2013  . History of blood clots 2004   during cancer treatment  . HTN (hypertension) 01/28/2012  . Hyperglycemia 01/28/2012  . Hyperlipidemia 01/28/2012  . Hypertension   . Hypothyroidism 02/14/2014  . Muscle spasm 12/30/2011  . Nasal polyp 08/30/2012  . Obesity   . Osteoarthritis   . Osteopenia   . Pain of left heel 01/28/2012  . Peripheral neuropathy   . Peripheral vascular disease ([place]) 04   axillary,upper chest after 3 weeks on tamoxifen  . Pneumonia    hx  . Preventative health care 01/28/2012  . Shortness of breath dyspnea    occ on exersion  . Sleep apnea    on CPAP  . Tobacco abuse-unspec 10/12/2013  . Tuberculosis    latent  finishing 9 months tx on last month   Past Surgical History:  Procedure Laterality Date  . ABDOMINAL HYSTERECTOMY  1991  . APPENDECTOMY  91  . BILATERAL OOPHORECTOMY  01/2003  . BREAST SURGERY Left 2004   lumpectomy  . CARPAL TUNNEL RELEASE     right  . INCISION AND DRAINAGE ABSCESS Left 12/13/2015   Procedure: DRAINAGE LEFT MASTECTOMY WOUND INFECTION;  Surgeon: Fanny Skates, MD;  Location: [facility];  Service: General;  Laterality: Left;  . JOINT REPLACEMENT     right total knee  . MASTECTOMY W/ SENTINEL NODE BIOPSY Right 10/26/2015  . MASTECTOMY W/ SENTINEL NODE BIOPSY Bilateral 10/26/2015   Procedure: RIGHT TOTAL MASTECTOMY WITH RIGHT SENTINEL LYMPH NODE BIOPSY, INJECT BLUE DYE RIGHT BREAST, LEFT BREAST PROPHYLACTIC MASTECTOMY;  Surgeon: Fanny Skates, MD;  Location: [place];  Service: General;  Laterality: Bilateral;  . NASAL SEPTUM SURGERY  08  . PILONIDAL CYST EXCISION    . THYROIDECTOMY, PARTIAL  mid 80's   right side   Current Outpatient Prescriptions on File Prior to Visit  Medication Sig Dispense Refill  . albuterol (PROVENTIL HFA;VENTOLIN HFA) 108 (90 BASE) MCG/ACT inhaler Inhale 2 puffs into the lungs every 4 (four) hours as needed for wheezing or shortness of breath (cough,  shortness of breath or wheezing.). 1 Inhaler 1  . amitriptyline (ELAVIL) 25 MG tablet TAKE 1 TABLET (25 MG TOTAL) BY MOUTH AT BEDTIME. 30 tablet 0  . Ascorbic Acid (VITAMIN C PO) Take 1 tablet by mouth 3 (three) times daily.    Marland Kitchen aspirin EC 81 MG tablet Take 81 mg by mouth daily.    . cholecalciferol (VITAMIN D) 1000 UNITS tablet Take 1,000 Units by mouth daily.    . cyclobenzaprine (FLEXERIL) 10 MG tablet Take 1 tablet (10 mg total) by mouth 3 (three) times daily as needed for muscle spasms. 90 tablet 3  . DULoxetine (CYMBALTA) 30 MG capsule TAKE 3 CAPSULES (90 MG TOTAL) BY  MOUTH DAILY. 270 capsule 1  . esomeprazole (NEXIUM) 20 MG capsule Take 20 mg by mouth daily at 12 noon.    . Fluticasone-Salmeterol (ADVAIR) 100-50 MCG/DOSE AEPB Inhale 1 puff into the lungs 2 (two) times daily. 1 each 3  . hydrochlorothiazide (HYDRODIURIL) 25 MG tablet TAKE 1 TABLET (25 MG TOTAL) BY MOUTH DAILY. 90 tablet 3  . ibuprofen (ADVIL,MOTRIN) 200 MG tablet Take 800 mg by mouth at bedtime.    Marland Kitchen levothyroxine (SYNTHROID, LEVOTHROID) 50 MCG tablet TAKE 1 TABLET (50 MCG TOTAL) BY MOUTH DAILY BEFORE BREAKFAST. 30 tablet 0  . metFORMIN (GLUCOPHAGE) 500 MG tablet Take 1 tablet (500 mg total) by mouth 2 (two) times daily with a meal. 180 tablet 1  . metoprolol tartrate (LOPRESSOR) 50 MG tablet Take 1 tablet (50 mg total) by mouth 2 (two) times daily. 180 tablet 3  . Multiple Vitamins-Minerals (MULTIVITAMIN WITH MINERALS) tablet Take 1 tablet by mouth daily.    . mupirocin ointment (BACTROBAN) 2 % Apply 1 application topically 4 (four) times daily. 30 g 0  . pravastatin (PRAVACHOL) 40 MG tablet Take 1 tablet (40 mg total) by mouth daily. 90 tablet 0  . temazepam (RESTORIL) 15 MG capsule TAKE ONE CAPSULE BY MOUTH AT BEDTIME AS NEEDED FOR SLEEP 30 capsule 3   No current facility-administered medications on file prior to visit.    Allergies  Allergen Reactions  . Chantix [Varenicline] Other (See Comments)    "Felt like  having mental breakdown"  . Ciprofloxacin Other (See Comments)    Muscle tightness, tendon aching and pain  . Penicillins Anaphylaxis and Swelling  . Tamoxifen Other (See Comments)    Blood clots  . Levaquin [Levofloxacin] Other (See Comments)    Severe tendon pain  . Lyrica [Pregabalin] Swelling  . Neurontin [Gabapentin] Swelling  . Morphine And Related Other (See Comments)    MORPHINE DRIP - causes pt to feel irritable and itch  . Ceclor [Cefaclor] Rash  . Other Other (See Comments)    Redness from tape and bandaides  . Sulfa Antibiotics Rash   Family History  Problem Relation Age of Onset  . Cancer Mother 58       liver cancer, hep c  . Heart disease Mother        chf  . Hypertension Mother   . Hyperlipidemia Mother   . Osteoporosis Mother   . Cancer Father 27       lung; smoker  . COPD Father   . Heart disease Sister        mvp  . Breast cancer Sister        dx. 95s  . Non-Hodgkin's lymphoma Sister 85       large B cell  . Hypertension Brother   . Arthritis Brother   . Kidney disease Brother   . Prostate cancer Brother        dx. early 56s  . Other Daughter        Beal's Syndrome  . Arthritis Daughter        Beal's  . Arthritis Son        Beal's connective tissue disease  . Vision loss Maternal Grandmother   . Dementia Maternal Grandmother   . Coronary artery disease Maternal Grandmother   . Heart Problems Maternal Grandmother   . Vision loss Paternal Grandfather   . Hypertension Brother   . Benign prostatic hyperplasia Brother   . Leukemia Brother        chronic lymphatic  leukemia - small/B cell  . Hypertension Brother   . COPD Brother   . Prostate cancer Brother        dx. mid-60s  . Myelodysplastic syndrome Brother        dx. early 76s  . Hypertension Brother   . Hyperlipidemia Brother   . Prostate cancer Brother        dx. mid-70s  . Melanoma Brother        (x2) melanomas dx. late 60s-early 62s  . Mental illness Brother        schizophrenia    . Breast cancer Brother        dx. 40s  . Cirrhosis Brother        hep c  . Hypertension Sister   . Other Sister        thalessemia, anemia; hx of hysterectomy in her 53s  . Hyperlipidemia Sister   . Gout Sister   . Diverticulitis Sister   . Other Daughter        overweight  . Breast cancer Maternal Aunt 68  . Lung cancer Cousin        maternal 1st cousin dx. 63 or younger; former smoker  . Colon cancer Cousin        maternal 1st cousin dx. late 38s-early 60s  . Cancer Cousin        maternal 1st cousin d. NOS cancer  . Emphysema Paternal Aunt   . Lung cancer Paternal Aunt        d. early 71s; smoker  . Leukemia Cousin        paternal 1st cousin d. early 23s  . Colon cancer Other 71       niece  . Melanoma Other        niece   Social History   Social History  . Marital status: Married    Spouse name: N/A  . Number of children: N/A  . Years of education: N/A   Social History Main Topics  . Smoking status: Former Smoker    Packs/day: 0.50    Years: 42.00    Types: Cigarettes    Quit date: 10/30/2015  . Smokeless tobacco: Never Used  . Alcohol use No  . Drug use: No  . Sexual activity: No   Other Topics Concern  . None   Social History Narrative  . None   Depression screen [facility] 2/9 01/24/2017 11/30/2016 11/26/2016 06/25/2016 05/31/2016  Decreased Interest 0 0 0 0 0  Down, Depressed, Hopeless 0 0 0 0 0  PHQ - 2 Score 0 0 0 0 0    Review of Systems See hpi    Objective:   Physical Exam  Constitutional: She is oriented to person, place, and time. She appears well-developed and well-nourished. No distress.  HENT:  Head: Normocephalic and atraumatic.  Right Ear: External ear normal.  Left Ear: External ear normal.  Eyes: Conjunctivae are normal. No scleral icterus.  Neck: Normal range of motion. Neck supple. No thyromegaly present.  Cardiovascular: Normal rate, regular rhythm, normal heart sounds and intact distal pulses.   Pulmonary/Chest: Effort normal and  breath sounds normal. No respiratory distress.  Musculoskeletal: She exhibits no edema.  Lymphadenopathy:    She has no cervical adenopathy.  Neurological: She is alert and oriented to person, place, and time.  Skin: Skin is warm and dry. She is not diaphoretic. No erythema.  Numerous small mobile tender 1-2 cm round to oval subcutaneous masses over left>right anterior upper thigh,  upper medial back, left>right upper lateral arm. No overlying skin changes  Psychiatric: She has a normal mood and affect. Her behavior is normal.      BP 112/73 (BP Location: Left Arm, Patient Position: Sitting, Cuff Size: Large)   Pulse 88   Temp 98.3 F (36.8 C) (Oral)   Resp 18   Ht 5' 3.7" (1.618 m)   Wt 233 lb 3.2 oz (105.8 kg)   SpO2 98%   BMI 40.41 kg/m   Results for orders placed or performed in visit on 11/26/16  Comprehensive metabolic panel  Result Value Ref Range   Glucose 91 65 - 99 mg/dL   BUN 12 8 - 27 mg/dL   Creatinine, Ser 0.78 0.57 - 1.00 mg/dL   GFR calc non Af Amer 80 >59 mL/min/1.73   GFR calc Af Amer 92 >59 mL/min/1.73   BUN/Creatinine Ratio 15 12 - 28   Sodium 141 134 - 144 mmol/L   Potassium 3.8 3.5 - 5.2 mmol/L   Chloride 99 96 - 106 mmol/L   CO2 26 20 - 29 mmol/L   Calcium 9.7 8.7 - 10.3 mg/dL   Total Protein 6.7 6.0 - 8.5 g/dL   Albumin 4.5 3.6 - 4.8 g/dL   Globulin, Total 2.2 1.5 - 4.5 g/dL   Albumin/Globulin Ratio 2.0 1.2 - 2.2   Bilirubin Total 0.2 0.0 - 1.2 mg/dL   Alkaline Phosphatase 82 39 - 117 IU/L   AST 27 0 - 40 IU/L   ALT 55 (H) 0 - 32 IU/L  CBC with Differential/Platelet  Result Value Ref Range   WBC 7.2 3.4 - 10.8 x10E3/uL   RBC 4.75 3.77 - 5.28 x10E6/uL   Hemoglobin 13.7 11.1 - 15.9 g/dL   Hematocrit 40.1 34.0 - 46.6 %   MCV 84 79 - 97 fL   MCH 28.8 26.6 - 33.0 pg   MCHC 34.2 31.5 - 35.7 g/dL   RDW 13.5 12.3 - 15.4 %   Platelets 268 150 - 379 x10E3/uL   Neutrophils 34 Not Estab. %   Lymphs 55 Not Estab. %   Monocytes 6 Not Estab. %   Eos  5 Not Estab. %   Basos 0 Not Estab. %   Neutrophils Absolute 2.5 1.4 - 7.0 x10E3/uL   Lymphocytes Absolute 4.0 (H) 0.7 - 3.1 x10E3/uL   Monocytes Absolute 0.4 0.1 - 0.9 x10E3/uL   EOS (ABSOLUTE) 0.4 0.0 - 0.4 x10E3/uL   Basophils Absolute 0.0 0.0 - 0.2 x10E3/uL   Immature Granulocytes 0 Not Estab. %   Immature Grans (Abs) 0.0 0.0 - 0.1 x10E3/uL  Microalbumin/Creatinine Ratio, Urine  Result Value Ref Range   Creatinine, Urine 23.7 Not Estab. mg/dL   Albumin, Urine <3.0 Not Estab. ug/mL   Microalb/Creat Ratio <12.7 0.0 - 30.0 mg/g creat  TSH  Result Value Ref Range   TSH 1.110 0.450 - 4.500 uIU/mL  POCT glycosylated hemoglobin (Hb A1C)  Result Value Ref Range   Hemoglobin A1C 6.0         Assessment & Plan:  A1c, tsh, cmp, lipid, cbc Microalb, foot exam Needs OPtho exam - last was 05/2015 GI - CRS SCREEN?   [place] to confirm lipoma in left anterior thigh and upper back.  Sched CPE??? - long overdue Just turn 66 yo this year [name] will need to repeat both pneumonia vaccines - start with the -13 valent but will wait to give until 2019 [name] it is 5 years since prior 13-valent.   Overdue for  colonoscopy but patient currently feeling overwhelmed with several referrals. Last colonoscopy in 2007 was completely normal [name] okay to defer for several more months.  1. Essential hypertension   2. Chronic obstructive pulmonary disease, unspecified COPD type ([place]) - last PFTs 2 yrs ago, cont advair 100-50 bid and RTC for recheck PFTs in 6-8 wks  3. Hypothyroidism, unspecified type - Has been on very low dose levothyroxine. Could probably go off as would guess that remaining left lobe of thyroid is sufficiently functioning but due to history of cold thyroid nodule may be indicated to keep levothyroxine on board for growth suppression. Patient with continued complaints of fatigue [name] will try increasing levothyroxine from 25-50 g but needs recheck TSH in 6-8 weeks before any further refills   4. Controlled  type 2 diabetes mellitus with diabetic polyneuropathy, without long-term current use of insulin ([place]) - - refer to foot center for DM shoes. Patient agrees to make appointment with ophthalmology. Peripheral neuropathy significantly worsening [name] will try amitriptyline qhs. Cont qhs compression hose. Dose limited due to potential interactions with Cymbalta and Flexeril.   5. Fatty liver disease, nonalcoholic - seen on CT  6. Gastroesophageal reflux disease, esophagitis presence not specified   7. Class 2 severe obesity due to excess calories with serious comorbidity and body mass index (BMI) of 38.0 to 38.9 in adult ([place])   8. Pure hypercholesterolemia - pt not sure why she is on fenofibrate rather than a statin but happy to try swtiching - reviewed need to watch LFTs since already minimally elev with fatting liver and caution with muscle ache side effect due to FM but pt very eager to try statin due to known benefits. Stop fenofibrate, start pravastain. Recheck LFTs in 6-8 wks.  9. Fibromyalgia - cont cymbalta 90 and cyclobenzaprin 10 qhs, start amitriptyline 25 qhs.  10. Anxiety and depression   11. Greater trochanteric bursitis of left hip - try home exercises and return to clinic for cortisone injection   12. Fibromyalgia muscle pain   13. Other specified hypothyroidism   14.    Mass of subcuteneous tissue - I suspect these are benign lipomas but I do think we need to further evaluate them as has developed [name] many of them recently and that they are tender, also odd that they are confined to a symmetric proximal location. Hopefully it is the lipomas causing tender points underneath them due to the increased soft tissue pressure in the setting of fibromyalgia but will further eval with soft tissue US but may need biopsy to confirm.  No orders of the defined types were placed in this encounter.   No orders of the defined types were placed in this encounter.  REFILL LEVOTHYROXINE AFTER LABS  RETURN.  [name], M.D.  Primary Care at [facility] [address] [place], [place] [address] [phone] phone [phone] fax  01/24/17 11:17 AM

## 2017-01-25 LAB — COMPREHENSIVE METABOLIC PANEL
ALBUMIN: 4.4 g/dL (ref 3.6–4.8)
ALK PHOS: 87 IU/L (ref 39–117)
ALT: 44 IU/L — ABNORMAL HIGH (ref 0–32)
AST: 26 IU/L (ref 0–40)
Albumin/Globulin Ratio: 2 (ref 1.2–2.2)
BUN / CREAT RATIO: 18 (ref 12–28)
BUN: 14 mg/dL (ref 8–27)
Bilirubin Total: 0.3 mg/dL (ref 0.0–1.2)
CALCIUM: 9.4 mg/dL (ref 8.7–10.3)
CHLORIDE: 102 mmol/L (ref 96–106)
CO2: 24 mmol/L (ref 20–29)
CREATININE: 0.77 mg/dL (ref 0.57–1.00)
GFR calc Af Amer: 94 mL/min/{1.73_m2} (ref >59)
GFR, EST NON AFRICAN AMERICAN: 81 mL/min/{1.73_m2} (ref >59)
GLUCOSE: 105 mg/dL — AB (ref 65–99)
Globulin, Total: 2.2 g/dL (ref 1.5–4.5)
POTASSIUM: 4 mmol/L (ref 3.5–5.2)
Sodium: 141 mmol/L (ref 134–144)
Total Protein: 6.6 g/dL (ref 6.0–8.5)

## 2017-01-25 LAB — CBC WITH DIFFERENTIAL/PLATELET
BASOS: 0 %
Basophils Absolute: 0 10*3/uL (ref 0.0–0.2)
EOS (ABSOLUTE): 0.2 10*3/uL (ref 0.0–0.4)
EOS: 3 %
HEMATOCRIT: 38.6 % (ref 34.0–46.6)
HEMOGLOBIN: 13.1 g/dL (ref 11.1–15.9)
IMMATURE GRANS (ABS): 0 10*3/uL (ref 0.0–0.1)
IMMATURE GRANULOCYTES: 0 %
Lymphocytes Absolute: 3.4 10*3/uL — ABNORMAL HIGH (ref 0.7–3.1)
Lymphs: 52 %
MCH: 28.6 pg (ref 26.6–33.0)
MCHC: 33.9 g/dL (ref 31.5–35.7)
MCV: 84 fL (ref 79–97)
MONOCYTES: 7 %
Monocytes Absolute: 0.5 10*3/uL (ref 0.1–0.9)
NEUTROS PCT: 38 %
Neutrophils Absolute: 2.5 10*3/uL (ref 1.4–7.0)
Platelets: 262 10*3/uL (ref 150–379)
RBC: 4.58 x10E6/uL (ref 3.77–5.28)
RDW: 13.7 % (ref 12.3–15.4)
WBC: 6.6 10*3/uL (ref 3.4–10.8)

## 2017-01-25 LAB — LIPID PANEL
CHOL/HDL RATIO: 3.2 ratio (ref 0.0–4.4)
Cholesterol, Total: 120 mg/dL (ref 100–199)
HDL: 38 mg/dL — AB (ref >39)
LDL CALC: 57 mg/dL (ref 0–99)
TRIGLYCERIDES: 127 mg/dL (ref 0–149)
VLDL CHOLESTEROL CAL: 25 mg/dL (ref 5–40)

## 2017-01-25 LAB — TSH: TSH: 0.781 u[IU]/mL (ref 0.450–4.500)

## 2017-01-27 MED ORDER — LEVOTHYROXINE SODIUM 50 MCG PO TABS: 50.0000 ug | ORAL_TABLET | Freq: Every day | ORAL | 1 refills | Status: DC

## 2017-01-29 ENCOUNTER — Other Ambulatory Visit: Payer: Self-pay | Admitting: Family Medicine

## 2017-01-30 ENCOUNTER — Other Ambulatory Visit: Payer: Self-pay | Admitting: Family Medicine

## 2017-02-20 ENCOUNTER — Other Ambulatory Visit: Payer: Self-pay | Admitting: Family Medicine

## 2017-02-21 ENCOUNTER — Other Ambulatory Visit: Payer: Self-pay | Admitting: Family Medicine

## 2017-02-21 DIAGNOSIS — E038 Other specified hypothyroidism: Secondary | ICD-10-CM

## 2017-02-26 MED ORDER — LEVOTHYROXINE SODIUM 50 MCG PO TABS: 50.0000 ug | ORAL_TABLET | Freq: Every day | ORAL | 1 refills | Status: DC

## 2017-02-26 NOTE — Telephone Encounter (Signed)
See on her med list, I had just sent a 6 month supply to her pharmacy last month. . .   Did they not get it? Did it go through to the wrong pharmacy?  And would have been ok to send in a new 6 month supply rather than a 2 month because of this. (I went ahead and did this, this is just FYI for future.)  Thanks!

## 2017-02-28 ENCOUNTER — Telehealth: Payer: Self-pay | Admitting: Podiatry

## 2017-02-28 NOTE — Telephone Encounter (Signed)
Left voicemail again for pt to call to schedule an appt to pick up diabetic shoes.

## 2017-03-04 ENCOUNTER — Ambulatory Visit (INDEPENDENT_AMBULATORY_CARE_PROVIDER_SITE_OTHER): Payer: 59 | Admitting: Orthotics

## 2017-03-04 DIAGNOSIS — E1140 Type 2 diabetes mellitus with diabetic neuropathy, unspecified: Secondary | ICD-10-CM

## 2017-03-04 DIAGNOSIS — M2041 Other hammer toe(s) (acquired), right foot: Secondary | ICD-10-CM

## 2017-03-04 DIAGNOSIS — E1149 Type 2 diabetes mellitus with other diabetic neurological complication: Secondary | ICD-10-CM

## 2017-03-04 DIAGNOSIS — G629 Polyneuropathy, unspecified: Secondary | ICD-10-CM

## 2017-03-04 NOTE — Progress Notes (Signed)

## 2017-04-20 ENCOUNTER — Encounter: Payer: Self-pay | Admitting: Family Medicine

## 2017-04-20 ENCOUNTER — Other Ambulatory Visit: Payer: Self-pay

## 2017-04-20 ENCOUNTER — Other Ambulatory Visit: Payer: Self-pay | Admitting: Family Medicine

## 2017-04-20 ENCOUNTER — Ambulatory Visit: Payer: 59 | Admitting: Family Medicine

## 2017-04-20 VITALS — BP 130/80 | HR 80 | Temp 98.5°F | Resp 18 | Ht 63.7 in | Wt 229.4 lb

## 2017-04-20 DIAGNOSIS — E7800 Pure hypercholesterolemia, unspecified: Secondary | ICD-10-CM

## 2017-04-20 DIAGNOSIS — F329 Major depressive disorder, single episode, unspecified: Secondary | ICD-10-CM

## 2017-04-20 DIAGNOSIS — R109 Unspecified abdominal pain: Secondary | ICD-10-CM

## 2017-04-20 DIAGNOSIS — E039 Hypothyroidism, unspecified: Secondary | ICD-10-CM | POA: Diagnosis not present

## 2017-04-20 DIAGNOSIS — Z5181 Encounter for therapeutic drug level monitoring: Secondary | ICD-10-CM | POA: Diagnosis not present

## 2017-04-20 DIAGNOSIS — E1142 Type 2 diabetes mellitus with diabetic polyneuropathy: Secondary | ICD-10-CM | POA: Diagnosis not present

## 2017-04-20 DIAGNOSIS — J440 Chronic obstructive pulmonary disease with acute lower respiratory infection: Secondary | ICD-10-CM | POA: Diagnosis not present

## 2017-04-20 DIAGNOSIS — F419 Anxiety disorder, unspecified: Secondary | ICD-10-CM

## 2017-04-20 DIAGNOSIS — M797 Fibromyalgia: Secondary | ICD-10-CM

## 2017-04-20 LAB — POCT URINALYSIS DIP (MANUAL ENTRY)
BILIRUBIN UA: NEGATIVE
BILIRUBIN UA: NEGATIVE mg/dL
Blood, UA: NEGATIVE
GLUCOSE UA: NEGATIVE mg/dL
LEUKOCYTES UA: NEGATIVE
Nitrite, UA: NEGATIVE
Protein Ur, POC: NEGATIVE mg/dL
SPEC GRAV UA: 1.01 (ref 1.010–1.025)
Urobilinogen, UA: 0.2 E.U./dL
pH, UA: 7 (ref 5.0–8.0)

## 2017-04-20 LAB — POCT CBC
GRANULOCYTE PERCENT: 45.3 % (ref 37–80)
HEMATOCRIT: 39.8 % (ref 37.7–47.9)
Hemoglobin: 14 g/dL (ref 12.2–16.2)
LYMPH, POC: 2.9 (ref 0.6–3.4)
MCH, POC: 29.3 pg (ref 27–31.2)
MCHC: 35.2 g/dL (ref 31.8–35.4)
MCV: 83.4 fL (ref 80–97)
MID (cbc): 0.4 (ref 0–0.9)
MPV: 7.1 fL (ref 0–99.8)
POC GRANULOCYTE: 2.8 (ref 2–6.9)
POC LYMPH %: 47.5 % (ref 10–50)
POC MID %: 7.2 % (ref 0–12)
Platelet Count, POC: 254 10*3/uL (ref 142–424)
RBC: 4.78 M/uL (ref 4.04–5.48)
RDW, POC: 12.7 %
WBC: 6.1 10*3/uL (ref 4.6–10.2)

## 2017-04-20 MED ORDER — PREDNISONE 20 MG PO TABS: 40.0000 mg | ORAL_TABLET | Freq: Every day | ORAL | 0 refills | Status: DC

## 2017-04-20 MED ORDER — AZITHROMYCIN 250 MG PO TABS: ORAL_TABLET | ORAL | 0 refills | Status: DC

## 2017-04-20 NOTE — Patient Instructions (Addendum)
Continue using the Advair and the nebulizer. Start the zpack today and the prednisone tomorrow morning. Plan to recheck with me in about 4 months for a complete physical unless we have to make any med changes in which case I will let you know when to recheck.     IF you received an x-ray today, you will receive an invoice from [name]. Please contact [facility] at [phone] with questions or concerns regarding your invoice.   IF you received labwork today, you will receive an invoice from [facility]. Please contact [facility] at [phone] with questions or concerns regarding your invoice.   Our billing staff will not be able to assist you with questions regarding bills from these companies.  You will be contacted with the lab results as soon as they are available. The fastest way to get your results is to activate your My Chart account. Instructions are located on the last page of this paperwork. If you have not heard from [place] regarding the results in 2 weeks, please contact this office.     Chronic Obstructive Pulmonary Disease Exacerbation Chronic obstructive pulmonary disease (COPD) is a common lung problem. In COPD, the flow of air from the lungs is limited. COPD exacerbations are times that breathing gets worse and you need extra treatment. Without treatment they can be life threatening. If they happen often, your lungs can become more damaged. If your COPD gets worse, your doctor may treat you with:  Medicines.  Oxygen.  Different ways to clear your airway, such as using a mask.  Follow these instructions at home:  Do not smoke.  Avoid tobacco smoke and other things that bother your lungs.  If given, take your antibiotic medicine as told. Finish the medicine even if you start to feel better.  Only take medicines as told by your doctor.  Drink enough fluids to keep your pee (urine) clear or pale yellow (unless your doctor has told you not to).  Use a  cool mist machine (vaporizer).  If you use oxygen or a machine that turns liquid medicine into a mist (nebulizer), continue to use them as told.  Keep up with shots (vaccinations) as told by your doctor.  Exercise regularly.  Eat healthy foods.  Keep all doctor visits as told. Get help right away if:  You are very short of breath and it gets worse.  You have trouble talking.  You have bad chest pain.  You have blood in your spit (sputum).  You have a fever.  You keep throwing up (vomiting).  You feel weak, or you pass out (faint).  You feel confused.  You keep getting worse. This information is not intended to replace advice given to you by your health care provider. Make sure you discuss any questions you have with your health care provider. Document Released: 04/12/2011 Document Revised: 09/29/2015 Document Reviewed: 12/26/2012 Elsevier Interactive Patient Education  2017 [employer].

## 2017-04-20 NOTE — Progress Notes (Addendum)
Subjective:  By signing my name below, I, Susan [name], attest that this documentation has been prepared under the direction and in the presence of [name], MD. Electronically Signed: Moises [name], [place]. 04/20/2017 , 12:20 PM .  Patient was seen in Room 2 .   Patient ID: Susan [name], female    DOB: 22-Nov-1950, 66 y.o.   MRN: [id number] Chief Complaint  Patient presents with  . Chest congestion    x1 week, Pt states it feels like the congestion is "deep" and tight. Pt states she has some cough and does cough up some phelgm. Pt has been using breathing treatments and OTC Muncinex.   HPI Susan [name] is a 66 y.o. female who presents to Primary Care at [facility] complaining of chest congestion with productive cough that started a week ago. Patient states her symptoms started as a chest cold, and started having productive discolored (grayish yellow, white) cough. She notes using her nebulizer in the morning and in the evening, mucinex and cough suppressants. She's noticed some nasal congestion over the week. She denies fever, chills or shortness of breath. She denies urinary symptoms and trying to stay hydrated.   Patient's levothyroxine was increased from 25mg  to 50mg  3 months prior. She has an appointment scheduled with me in 5 days for recheck. Patient is on Advair 100-50mg  BID for COPD. At last visit 3 months prior, we stopped fenofibrate and started pravastatin. Last visit also added amitriptyline for fibromyalgia and peripheral neuropathy. She mentions amitriptyline working well for her, reducing the burning in her feet, "now I just feel cold in my feet".   Past Medical History:  Diagnosis Date  . Anemia 01/28/2012  . Anxiety   . Cancer [facility]) 2004   ductal carcinoma; lumpectomy  . Cellulitis of chest wall 12/09/2015  . Complication of anesthesia    difficulty voiding after anesthesia  . COPD (chronic obstructive pulmonary disease) (HCC)    mild  . DDD (degenerative disc  disease)   . Depression   . Diabetes mellitus without complication ([place])   . DJD (degenerative joint disease)   . Dysrhythmia    hx palpitations?panic attack  . Fatty liver disease, nonalcoholic   . Fibromyalgia   . GERD (gastroesophageal reflux disease)   . Hip pain, right 10/12/2013  . History of [name] clots 2004   during cancer treatment  . HTN (hypertension) 01/28/2012  . Hyperglycemia 01/28/2012  . Hyperlipidemia 01/28/2012  . Hypertension   . Hypothyroidism 02/14/2014  . Muscle spasm 12/30/2011  . Nasal polyp 08/30/2012  . Obesity   . Osteoarthritis   . Osteopenia   . Pain of left heel 01/28/2012  . Peripheral neuropathy   . Peripheral vascular disease ([place]) 04   axillary,upper chest after 3 weeks on tamoxifen  . Pneumonia    hx  . Preventative health care 01/28/2012  . Shortness of breath dyspnea    occ on exersion  . Sleep apnea    on CPAP  . Tobacco abuse-unspec 10/12/2013  . Tuberculosis    latent  finishing 9 months tx on last month   Past Surgical History:  Procedure Laterality Date  . ABDOMINAL HYSTERECTOMY  1991  . APPENDECTOMY  91  . BILATERAL OOPHORECTOMY  01/2003  . BREAST SURGERY Left 2004   lumpectomy  . CARPAL TUNNEL RELEASE     right  . INCISION AND DRAINAGE ABSCESS Left 12/13/2015   Procedure: DRAINAGE LEFT MASTECTOMY WOUND INFECTION;  Surgeon: Fanny Skates, MD;  Location:  [facility];  Service: General;  Laterality: Left;  . JOINT REPLACEMENT     right total knee  . MASTECTOMY W/ SENTINEL NODE BIOPSY Right 10/26/2015  . MASTECTOMY W/ SENTINEL NODE BIOPSY Bilateral 10/26/2015   Procedure: RIGHT TOTAL MASTECTOMY WITH RIGHT SENTINEL LYMPH NODE BIOPSY, INJECT BLUE DYE RIGHT BREAST, LEFT BREAST PROPHYLACTIC MASTECTOMY;  Surgeon: Fanny Skates, MD;  Location: [facility];  Service: General;  Laterality: Bilateral;  . NASAL SEPTUM SURGERY  08  . PILONIDAL CYST EXCISION    . THYROIDECTOMY, PARTIAL  mid 80's   right side   Prior to Admission medications   Medication  Sig Start Date End Date Taking? Authorizing Provider  albuterol (PROVENTIL HFA;VENTOLIN HFA) 108 (90 Base) MCG/ACT inhaler Inhale 2 puffs into the lungs every 4 (four) hours as needed for wheezing or shortness of breath (cough, shortness of breath or wheezing.). 01/24/17   [name], MD  amitriptyline (ELAVIL) 25 MG tablet Take 1 tablet (25 mg total) by mouth at bedtime. 01/24/17   [name], MD  Ascorbic Acid (VITAMIN C PO) Take 1 tablet by mouth 3 (three) times daily.    [provider]  aspirin EC 81 MG tablet Take 81 mg by mouth daily.    [provider]  cholecalciferol (VITAMIN D) 1000 UNITS tablet Take 1,000 Units by mouth daily.    [provider]  cyclobenzaprine (FLEXERIL) 10 MG tablet Take 1 tablet (10 mg total) by mouth 3 (three) times daily as needed for muscle spasms. 11/27/16   [name], MD  DULoxetine (CYMBALTA) 30 MG capsule TAKE 3 CAPSULES (90 MG TOTAL) BY MOUTH DAILY. 11/27/16   [name], MD  esomeprazole (NEXIUM) 20 MG capsule Take 20 mg by mouth daily at 12 noon.    [provider]  fluticasone (FLONASE) 50 MCG/ACT nasal spray Place 2 sprays into both nostrils at bedtime. 01/24/17   [name], MD  hydrochlorothiazide (HYDRODIURIL) 25 MG tablet TAKE 1 TABLET (25 MG TOTAL) BY MOUTH DAILY. 11/27/16   [name], MD  ibuprofen (ADVIL,MOTRIN) 200 MG tablet Take 800 mg by mouth at bedtime.    [provider]  ipratropium (ATROVENT) 0.03 % nasal spray Place 2 sprays into the nose 4 (four) times daily. 01/24/17   [name], MD  levothyroxine (SYNTHROID, LEVOTHROID) 50 MCG tablet Take 1 tablet (50 mcg total) by mouth daily before breakfast. 02/26/17   [name], MD  metFORMIN (GLUCOPHAGE) 500 MG tablet Take 1 tablet (500 mg total) by mouth 2 (two) times daily with a meal. 11/27/16   [name], MD  metoprolol tartrate (LOPRESSOR) 50 MG tablet Take 1 tablet (50 mg total) by mouth 2 (two) times daily. 11/27/16   [name], MD  Multiple  Vitamins-Minerals (MULTIVITAMIN WITH MINERALS) tablet Take 1 tablet by mouth daily.    [provider]  pravastatin (PRAVACHOL) 40 MG tablet TAKE 1 TABLET BY MOUTH EVERY DAY 02/20/17   [name], MD  temazepam (RESTORIL) 15 MG capsule TAKE ONE CAPSULE BY MOUTH AT BEDTIME AS NEEDED FOR SLEEP 08/24/16   [name], MD   Allergies  Allergen Reactions  . Chantix [Varenicline] Other (See Comments)    "Felt like having mental breakdown"  . Ciprofloxacin Other (See Comments)    Muscle tightness, tendon aching and pain  . Penicillins Anaphylaxis and Swelling  . Tamoxifen Other (See Comments)    [name] clots  . Levaquin [Levofloxacin] Other (See  Comments)    Severe tendon pain  . Lyrica [Pregabalin] Swelling  . Neurontin [Gabapentin] Swelling  . Morphine And Related Other (See Comments)    MORPHINE DRIP - causes pt to feel irritable and itch  . Ceclor [Cefaclor] Rash  . Other Other (See Comments)    Redness from tape and bandaides  . Sulfa Antibiotics Rash   Family History  Problem Relation Age of Onset  . Cancer Mother 38       liver cancer, hep c  . Heart disease Mother        chf  . Hypertension Mother   . Hyperlipidemia Mother   . Osteoporosis Mother   . Cancer Father 73       lung; smoker  . COPD Father   . Heart disease Sister        mvp  . Breast cancer Sister        dx. 70s  . Non-Hodgkin's lymphoma Sister 25       large B cell  . Hypertension Brother   . Arthritis Brother   . Kidney disease Brother   . Prostate cancer Brother        dx. early 9s  . Other Daughter        Beal's Syndrome  . Arthritis Daughter        Beal's  . Arthritis Son        Beal's connective tissue disease  . Vision loss Maternal Grandmother   . Dementia Maternal Grandmother   . Coronary artery disease Maternal Grandmother   . Heart Problems Maternal Grandmother   . Vision loss Paternal Grandfather   . Hypertension Brother   . Benign prostatic hyperplasia Brother   . Leukemia  Brother        chronic lymphatic leukemia - small/B cell  . Hypertension Brother   . COPD Brother   . Prostate cancer Brother        dx. mid-60s  . Myelodysplastic syndrome Brother        dx. early 70s  . Hypertension Brother   . Hyperlipidemia Brother   . Prostate cancer Brother        dx. mid-70s  . Melanoma Brother        (x2) melanomas dx. late 60s-early 53s  . Mental illness Brother        schizophrenia  . Breast cancer Brother        dx. 37s  . Cirrhosis Brother        hep c  . Hypertension Sister   . Other Sister        thalessemia, anemia; hx of hysterectomy in her 29s  . Hyperlipidemia Sister   . Gout Sister   . Diverticulitis Sister   . Other Daughter        overweight  . Breast cancer Maternal Aunt 60  . Lung cancer Cousin        maternal 1st cousin dx. 47 or younger; former smoker  . Colon cancer Cousin        maternal 1st cousin dx. late 53s-early 60s  . Cancer Cousin        maternal 1st cousin d. NOS cancer  . Emphysema Paternal Aunt   . Lung cancer Paternal Aunt        d. early 78s; smoker  . Leukemia Cousin        paternal 1st cousin d. early 4s  . Colon cancer Other 63       niece  .  Melanoma Other        niece   Social History   Socioeconomic History  . Marital status: Married    Spouse name: None  . Number of children: None  . Years of education: None  . Highest education level: None  Social Needs  . Financial resource strain: None  . Food insecurity - worry: None  . Food insecurity - inability: None  . Transportation needs - medical: None  . Transportation needs - non-medical: None  Occupational History  . None  Tobacco Use  . Smoking status: Former Smoker    Packs/day: 0.50    Years: 42.00    Pack years: 21.00    Types: Cigarettes    Last attempt to quit: 10/30/2015    Years since quitting: 1.4  . Smokeless tobacco: Never Used  Substance and Sexual Activity  . Alcohol use: No    Alcohol/week: 0.0 oz  . Drug use: No  .  Sexual activity: No  Other Topics Concern  . None  Social History Narrative  . None   Depression screen [facility] 2/9 04/20/2017 01/24/2017 11/30/2016 11/26/2016 06/25/2016  Decreased Interest 0 0 0 0 0  Down, Depressed, Hopeless 0 0 0 0 0  PHQ - 2 Score 0 0 0 0 0    Review of Systems  Constitutional: Negative for chills, fatigue, fever and unexpected weight change.  HENT: Positive for congestion and rhinorrhea.   Respiratory: Positive for cough. Negative for shortness of breath and wheezing.   Gastrointestinal: Negative for constipation, diarrhea, nausea and vomiting.  Skin: Negative for rash and wound.  Neurological: Negative for dizziness, weakness and headaches.       Objective:   Physical Exam  Constitutional: She is oriented to person, place, and time. She appears well-developed and well-nourished. No distress.  HENT:  Head: Normocephalic and atraumatic.  Right Ear: Tympanic membrane normal.  Left Ear: Tympanic membrane normal.  Mouth/Throat: Oropharynx is clear and moist.  Nares with purulent rhinitis and dried mucosa  Eyes: EOM are normal. Pupils are equal, round, and reactive to light.  Neck: Neck supple. No thyromegaly present.  Cardiovascular: Normal rate.  Pulmonary/Chest: Effort normal. No respiratory distress.  Musculoskeletal: Normal range of motion.  Lymphadenopathy:    She has no cervical adenopathy.  Neurological: She is alert and oriented to person, place, and time.  Skin: Skin is warm and dry.  Psychiatric: She has a normal mood and affect. Her behavior is normal.  Nursing note and vitals reviewed.   BP 130/80 (BP Location: Left Arm, Patient Position: Sitting, Cuff Size: Large)   Pulse 80   Temp 98.5 F (36.9 C) (Oral)   Resp 18   Ht 5' 3.7" (1.618 m)   Wt 229 lb 6.4 oz (104.1 kg)   SpO2 97%   BMI 39.75 kg/m      Assessment & Plan:   1. Chronic obstructive pulmonary disease with acute lower respiratory infection ([place])   2. Acquired hypothyroidism     3. Controlled type 2 diabetes mellitus with diabetic polyneuropathy, without long-term current use of insulin ([place])   4. Medication monitoring encounter   5. Right flank pain   6. Pure hypercholesterolemia - ok to refill pravastatin x 6 mos when requested - recheck flp in 3-4 mos as has only been on the pravastatin for <3 mos at this time - and if not sig improved then either think about adding back in lower dose fenofibrate (was on 160mg  which is highest dose) but  could try lowest dose of 40mg  along with the statin or changing pravastatin 40 to atorvastatin 40.  7.      Anxiety and depression/fibromyalgia - ok to refill cymbalta 90mg  x 6 mos when requested. Cont amitriptyline 25mg  qhs.  Ok to refill all of current medication regimen until next f/u in 4-6 mos -    Orders Placed This Encounter  Procedures  . TSH  . Comprehensive metabolic panel  . Hemoglobin A1c  . Lipid panel    Order Specific Question:   Has the patient fasted?    Answer:   Yes  . POCT CBC  . POCT urinalysis dipstick    Meds ordered this encounter  Medications  . predniSONE (DELTASONE) 20 MG tablet    Sig: Take 2 tablets (40 mg total) by mouth daily with breakfast.    Dispense:  10 tablet    Refill:  0  . azithromycin (ZITHROMAX) 250 MG tablet    Sig: Take 2 tabs PO x 1 dose, then 1 tab PO QD x 4 days    Dispense:  6 tablet    Refill:  0    I personally performed the services described in this documentation, which was scribed in my presence. The recorded information has been reviewed and considered, and addended by me as needed.   [name], M.D.  Primary Care at [facility] [address] [place], [place] [address] [phone] phone [phone] fax  04/23/17 10:11 AM

## 2017-04-21 LAB — COMPREHENSIVE METABOLIC PANEL
A/G RATIO: 1.8 (ref 1.2–2.2)
ALBUMIN: 4.6 g/dL (ref 3.6–4.8)
ALT: 60 IU/L — ABNORMAL HIGH (ref 0–32)
AST: 36 IU/L (ref 0–40)
Alkaline Phosphatase: 91 IU/L (ref 39–117)
BILIRUBIN TOTAL: 0.5 mg/dL (ref 0.0–1.2)
BUN / CREAT RATIO: 16 (ref 12–28)
BUN: 13 mg/dL (ref 8–27)
CALCIUM: 9.5 mg/dL (ref 8.7–10.3)
CHLORIDE: 99 mmol/L (ref 96–106)
CO2: 24 mmol/L (ref 20–29)
Creatinine, Ser: 0.81 mg/dL (ref 0.57–1.00)
GFR, EST AFRICAN AMERICAN: 88 mL/min/{1.73_m2} (ref >59)
GFR, EST NON AFRICAN AMERICAN: 76 mL/min/{1.73_m2} (ref >59)
Globulin, Total: 2.5 g/dL (ref 1.5–4.5)
Glucose: 93 mg/dL (ref 65–99)
POTASSIUM: 4.2 mmol/L (ref 3.5–5.2)
Sodium: 141 mmol/L (ref 134–144)
TOTAL PROTEIN: 7.1 g/dL (ref 6.0–8.5)

## 2017-04-21 LAB — HEMOGLOBIN A1C
Est. average glucose Bld gHb Est-mCnc: 131 mg/dL
Hgb A1c MFr Bld: 6.2 % — ABNORMAL HIGH (ref 4.8–5.6)

## 2017-04-21 LAB — TSH: TSH: 1.09 u[IU]/mL (ref 0.450–4.500)

## 2017-04-21 LAB — LIPID PANEL
CHOL/HDL RATIO: 4.1 ratio (ref 0.0–4.4)
Cholesterol, Total: 122 mg/dL (ref 100–199)
HDL: 30 mg/dL — ABNORMAL LOW (ref >39)
LDL CALC: 56 mg/dL (ref 0–99)
TRIGLYCERIDES: 180 mg/dL — AB (ref 0–149)
VLDL CHOLESTEROL CAL: 36 mg/dL (ref 5–40)

## 2017-04-22 ENCOUNTER — Other Ambulatory Visit: Payer: Self-pay | Admitting: Family Medicine

## 2017-04-25 ENCOUNTER — Ambulatory Visit: Payer: 59 | Admitting: Family Medicine

## 2017-04-26 ENCOUNTER — Other Ambulatory Visit: Payer: Self-pay | Admitting: Family Medicine

## 2017-04-26 MED ORDER — TIZANIDINE HCL 4 MG PO TABS: 4.0000 mg | ORAL_TABLET | Freq: Three times a day (TID) | ORAL | 1 refills | Status: DC | PRN

## 2017-04-26 NOTE — Progress Notes (Signed)
Received note from [facility] that all cyclobenzaprine hcl is back-ordered/unavailable. Alternative requested.  Will change to tizanidine.

## 2017-05-06 MED ORDER — METFORMIN HCL 500 MG PO TABS: 500.0000 mg | ORAL_TABLET | Freq: Two times a day (BID) | ORAL | 1 refills | Status: DC

## 2017-05-06 NOTE — Addendum Note (Signed)
Addended by: [name] on: 05/06/2017 02:34 AM   Modules accepted: Orders

## 2017-06-22 ENCOUNTER — Other Ambulatory Visit: Payer: Self-pay | Admitting: Family Medicine

## 2017-06-22 DIAGNOSIS — M797 Fibromyalgia: Secondary | ICD-10-CM

## 2017-07-02 ENCOUNTER — Other Ambulatory Visit: Payer: Self-pay | Admitting: Family Medicine

## 2017-07-03 ENCOUNTER — Ambulatory Visit: Payer: Self-pay

## 2017-07-03 MED ORDER — BENZONATATE 200 MG PO CAPS: 200.0000 mg | ORAL_CAPSULE | Freq: Three times a day (TID) | ORAL | 0 refills | Status: DC | PRN

## 2017-07-03 MED ORDER — CLARITHROMYCIN 250 MG PO TABS: 250.0000 mg | ORAL_TABLET | Freq: Two times a day (BID) | ORAL | 0 refills | Status: DC

## 2017-07-03 MED ORDER — PREDNISONE 50 MG PO TABS: 50.0000 mg | ORAL_TABLET | Freq: Every day | ORAL | 0 refills | Status: DC

## 2017-07-03 NOTE — Telephone Encounter (Signed)
Refill for flonase, pended.  Please review. Provider: [name], MD  LOV  04/20/17  Pharm[facility]:  [facility] [place], [place]

## 2017-07-03 NOTE — Telephone Encounter (Signed)
Patient called in with c/o "sore throat, sinus drainage, cough." She says "I am in [place] visiting my son who had surgery. Monday night I noticed a copious amount of sinus drainage and my throat was sore on yesterday. The drainage tastes horrible, like pus. My throat on the left side is red with a white strip and tiny blisters. I am able to swallow and eat without problems, I don't have a fever. The drainage is thick yellow-green. I guess this drainage is causing me to cough up phlegm. I am a little SOB on exertion, but otherwise my breathing is fine. I don't know about exposure, but I was on a plane, in the airport, at the hospital overnight with my son all before the symptoms started. I have called my insurance company to see if there are any providers here in my network, but is not having any luck. So, I decided to call and see if Dr. Brigitte Pulse can call me in something for the symptoms I'm having. I don't want to go to the ER because this is not an emergency, but I do need some medication. If she advises me to be seen by a physician, then I will go." I asked has she checked for any UC facilities in the area, in case this is what Dr. Brigitte Pulse recommends, she says "I haven't, but I will call as soon as we hang up to ask." I asked for a pharmacy to call in a prescription, she says: [place] [address] Phone: [phone] I advised that she will need to see a provider within 3 days per protocol, care advice given, she verbalized understanding. I advised that someone from the office will be in contact with Dr. Raul Del recommendation, she verbalized understanding and again said "I will call my insurance company to find a local UC to go to if she suggests it."   Reason for Disposition . [1] Sore throat with cough/cold symptoms AND [2] present > 5 days  Answer Assessment - Initial Assessment Questions 1. ONSET: "When did the throat start hurting?" (Hours or days ago)      Yesterday  at 4 pm 2. SEVERITY: "How bad is the sore throat?" (Scale 1-10; mild, moderate or severe)   - MILD (1-3):  doesn't interfere with eating or normal activities   - MODERATE (4-7): interferes with eating some solids and normal activities   - SEVERE (8-10):  excruciating pain, interferes with most normal activities   - SEVERE DYSPHAGIA: can't swallow liquids, drooling     Mild 3. STREP EXPOSURE: "Has there been any exposure to strep within the past week?" If so, ask: "What type of contact occurred?"      No idea-been on airplane, airport and hospital 4.  VIRAL SYMPTOMS: "Are there any symptoms of a cold, such as a runny nose, cough, hoarse voice or red eyes?"      Sinus drainage, cough 5. FEVER: "Do you have a fever?" If so, ask: "What is your temperature, how was it measured, and when did it start?"     No 6. PUS ON THE TONSILS: "Is there pus on the tonsils in the back of your throat?"     Drainage tastes like pus, red with white strip with tiny blisters on left side 7. OTHER SYMPTOMS: "Do you have any other symptoms?" (e.g., difficulty breathing, headache, rash)     Sinus drainage, cough, a little SOB with exertion-not bad 8. PREGNANCY: "Is there any chance you  are pregnant?" "When was your last menstrual period?"     No  Protocols used: SORE THROAT-A-AH

## 2017-07-03 NOTE — Telephone Encounter (Signed)
Does have tonsils still.  Thought she saw tiny blisters and white streaks.Left throat much more swollen and red Able to swallow. Breathing is ok - just today beginning to feel a little tight - does have albuterol HFA w/ her which helps a little but not nebulizer.  Has been exposed to so much between the plane and the hospital.   Trx'd with zpack 2 mos ago. Allergies: Sulfa, PCN anaphylaxis, ceclor, flouroquinolones. Was rx'd omnicef by Dr. Lindi Adie in 2017 but no notes on if she took it or how she tolerated it.  Will send in Biaxin.  Hold off on prednisone for now but can start if throat swelling worse or chest tightness/wheezing worse. Prn tessalon. Did find local UC that she can go to if worsening at all or no improvement in 48 hrs.

## 2017-07-04 ENCOUNTER — Other Ambulatory Visit: Payer: Self-pay | Admitting: Family Medicine

## 2017-07-04 ENCOUNTER — Encounter: Payer: Self-pay | Admitting: Family Medicine

## 2017-07-06 NOTE — Telephone Encounter (Signed)
Dr. Brigitte Pulse spoke with pt.

## 2017-07-10 ENCOUNTER — Ambulatory Visit (INDEPENDENT_AMBULATORY_CARE_PROVIDER_SITE_OTHER): Payer: 59

## 2017-07-10 ENCOUNTER — Ambulatory Visit: Payer: 59 | Admitting: Family Medicine

## 2017-07-10 ENCOUNTER — Encounter: Payer: Self-pay | Admitting: Family Medicine

## 2017-07-10 ENCOUNTER — Other Ambulatory Visit: Payer: Self-pay

## 2017-07-10 VITALS — BP 145/79 | HR 88 | Temp 98.2°F | Resp 18 | Ht 63.7 in | Wt 230.8 lb

## 2017-07-10 DIAGNOSIS — R05 Cough: Secondary | ICD-10-CM

## 2017-07-10 DIAGNOSIS — J181 Lobar pneumonia, unspecified organism: Secondary | ICD-10-CM

## 2017-07-10 DIAGNOSIS — J441 Chronic obstructive pulmonary disease with (acute) exacerbation: Secondary | ICD-10-CM

## 2017-07-10 DIAGNOSIS — R059 Cough, unspecified: Secondary | ICD-10-CM

## 2017-07-10 DIAGNOSIS — J189 Pneumonia, unspecified organism: Secondary | ICD-10-CM

## 2017-07-10 MED ORDER — CEFDINIR 300 MG PO CAPS: 600.0000 mg | ORAL_CAPSULE | Freq: Every day | ORAL | 0 refills | Status: DC

## 2017-07-10 NOTE — Progress Notes (Addendum)
Subjective:  By signing my name below, I, [name], attest that this documentation has been prepared under the direction and in the presence of [name], MD. Electronically Signed: [name], [place]. 07/10/2017 , 6:23 PM .  Patient was seen in Room 2 .   Patient ID: PALMYRA ROGACKI, female    DOB: 10-Jul-1950, 67 y.o.   MRN: [id number] Chief Complaint  Patient presents with  . Chest Congestion    x1 week, pt states she coughing up green mucous and fatigued. Pt states she started to feel better this morning.   HPI TRICHELLE LEHAN is a 67 y.o. female who presents to Primary Care at [facility] complaining of chest congestion with associated productive cough (green mucus), and fatigue that started about a week ago.   Patient was started on clarithromycin, and tessalon 2/27 and prednisone 2/28 (about 1 week ago). She was using her albuterol inhaler frequently with a lot of non-productive coughing. Yesterday, patient reported no improvement with greenish-yellow sputum with a low grade fever, weakness and shortness of breath continues. She's been using duo-neb treatment 3-4x a day, as well as albuterol inhaler. She finished prednisone 2 days ago. She's still on Clarithromycin 250mg  BID.   Patient informs she was in [place]. She was exposed to sick contact of flu, spent a night with her son in the hospital, and probably had other sick contact in-transit in the airport and on the airplane. She reports having spasms in her chest where she couldn't catch her breath every time she's speaking or laughing. She states she had her inhaler with her in [place]. When she returned home about 4 days ago, she's been using her duo-neb daily. She notes she's starting to turn the corner. She finished prednisone yesterday. She is still taking mucinex. She reports feeling relatively better today, though still with low grade fever and some chills.   Past Medical History:  Diagnosis Date  . Anemia 01/28/2012  .  Anxiety   . Cancer [facility]) 2004   ductal carcinoma; lumpectomy  . Cellulitis of chest wall 12/09/2015  . Complication of anesthesia    difficulty voiding after anesthesia  . COPD (chronic obstructive pulmonary disease) (HCC)    mild  . DDD (degenerative disc disease)   . Depression   . Diabetes mellitus without complication ([place])   . DJD (degenerative joint disease)   . Dysrhythmia    hx palpitations?panic attack  . Fatty liver disease, nonalcoholic   . Fibromyalgia   . GERD (gastroesophageal reflux disease)   . Hip pain, right 10/12/2013  . History of blood clots 2004   during cancer treatment  . HTN (hypertension) 01/28/2012  . Hyperglycemia 01/28/2012  . Hyperlipidemia 01/28/2012  . Hypertension   . Hypothyroidism 02/14/2014  . Muscle spasm 12/30/2011  . Nasal polyp 08/30/2012  . Obesity   . Osteoarthritis   . Osteopenia   . Pain of left heel 01/28/2012  . Peripheral neuropathy   . Peripheral vascular disease ([place]) 04   axillary,upper chest after 3 weeks on tamoxifen  . Pneumonia    hx  . Preventative health care 01/28/2012  . Shortness of breath dyspnea    occ on exersion  . Sleep apnea    on CPAP  . Tobacco abuse-unspec 10/12/2013  . Tuberculosis    latent  finishing 9 months tx on last month   Past Surgical History:  Procedure Laterality Date  . ABDOMINAL HYSTERECTOMY  1991  . APPENDECTOMY  91  . BILATERAL  OOPHORECTOMY  01/2003  . BREAST SURGERY Left 2004   lumpectomy  . CARPAL TUNNEL RELEASE     right  . INCISION AND DRAINAGE ABSCESS Left 12/13/2015   Procedure: DRAINAGE LEFT MASTECTOMY WOUND INFECTION;  Surgeon: Fanny Skates, MD;  Location: [place];  Service: General;  Laterality: Left;  . JOINT REPLACEMENT     right total knee  . MASTECTOMY W/ SENTINEL NODE BIOPSY Right 10/26/2015  . MASTECTOMY W/ SENTINEL NODE BIOPSY Bilateral 10/26/2015   Procedure: RIGHT TOTAL MASTECTOMY WITH RIGHT SENTINEL LYMPH NODE BIOPSY, INJECT BLUE DYE RIGHT BREAST, LEFT BREAST  PROPHYLACTIC MASTECTOMY;  Surgeon: Fanny Skates, MD;  Location: [place];  Service: General;  Laterality: Bilateral;  . NASAL SEPTUM SURGERY  08  . PILONIDAL CYST EXCISION    . THYROIDECTOMY, PARTIAL  mid 80's   right side   Prior to Admission medications   Medication Sig Start Date End Date Taking? Authorizing Provider  ADVAIR DISKUS 100-50 MCG/DOSE AEPB INHALE 1 PUFF INTO THE LUNGS 2 (TWO) TIMES DAILY. 07/05/17   [name], MD  albuterol (PROVENTIL HFA;VENTOLIN HFA) 108 (90 Base) MCG/ACT inhaler INHALE 2 PUFFS INTO THE LUNGS EVERY 4 HOURS AS NEEDED FOR WHEEZING OR SHORTNESS OF BREATH OR COUGH 07/07/17   [name], MD  amitriptyline (ELAVIL) 25 MG tablet Take 1 tablet (25 mg total) by mouth at bedtime. 01/24/17   [name], MD  Ascorbic Acid (VITAMIN C PO) Take 1 tablet by mouth 3 (three) times daily.    [provider]  aspirin EC 81 MG tablet Take 81 mg by mouth daily.    [provider]  azithromycin (ZITHROMAX) 250 MG tablet Take 2 tabs PO x 1 dose, then 1 tab PO QD x 4 days 04/20/17   [name], MD  benzonatate (TESSALON) 200 MG capsule Take 1 capsule (200 mg total) by mouth 3 (three) times daily as needed for cough. 07/03/17   [name], MD  cholecalciferol (VITAMIN D) 1000 UNITS tablet Take 1,000 Units by mouth daily.    [provider]  clarithromycin (BIAXIN) 250 MG tablet Take 1 tablet (250 mg total) by mouth 2 (two) times daily. 07/03/17   [name], MD  DULoxetine (CYMBALTA) 30 MG capsule TAKE 3 CAPSULES BY MOUTH EVERY DAY 06/22/17   [name], MD  esomeprazole (NEXIUM) 20 MG capsule Take 20 mg by mouth daily at 12 noon.    [provider]  fluticasone (FLONASE) 50 MCG/ACT nasal spray PLACE 2 SPRAYS INTO BOTH NOSTRILS AT BEDTIME. 07/04/17   [name], MD  hydrochlorothiazide (HYDRODIURIL) 25 MG tablet TAKE 1 TABLET BY MOUTH EVERY DAY 06/22/17   [name], MD  ibuprofen (ADVIL,MOTRIN) 200 MG tablet Take 800 mg by mouth at bedtime.     [provider]  ipratropium (ATROVENT) 0.03 % nasal spray Place 2 sprays into the nose 4 (four) times daily. 01/24/17   [name], MD  levothyroxine (SYNTHROID, LEVOTHROID) 50 MCG tablet Take 1 tablet (50 mcg total) by mouth daily before breakfast. 02/26/17   [name], MD  metFORMIN (GLUCOPHAGE) 500 MG tablet Take 1 tablet (500 mg total) by mouth 2 (two) times daily with a meal. 05/06/17   [name], MD  metoprolol tartrate (LOPRESSOR) 50 MG tablet Take 1 tablet (50 mg total) by mouth 2 (two) times daily. 11/27/16   [name], MD  Multiple Vitamins-Minerals (MULTIVITAMIN WITH MINERALS) tablet Take 1 tablet by  mouth daily.    [provider]  pravastatin (PRAVACHOL) 40 MG tablet Take 1 tablet (40 mg total) by mouth daily. 07/07/17   [name], MD  predniSONE (DELTASONE) 20 MG tablet Take 2 tablets (40 mg total) by mouth daily with breakfast. 04/20/17   [name], MD  predniSONE (DELTASONE) 50 MG tablet Take 1 tablet (50 mg total) by mouth daily with breakfast. 07/03/17   [name], MD  temazepam (RESTORIL) 15 MG capsule TAKE ONE CAPSULE BY MOUTH AT BEDTIME AS NEEDED FOR SLEEP 08/24/16   [name], MD  tiZANidine (ZANAFLEX) 4 MG tablet Take 1 tablet (4 mg total) by mouth every 8 (eight) hours as needed for muscle spasms. 04/26/17   [name], MD   Allergies  Allergen Reactions  . Chantix [Varenicline] Other (See Comments)    "Felt like having mental breakdown"  . Ciprofloxacin Other (See Comments)    Muscle tightness, tendon aching and pain  . Penicillins Anaphylaxis and Swelling  . Tamoxifen Other (See Comments)    Blood clots  . Levaquin [Levofloxacin] Other (See Comments)    Severe tendon pain  . Lyrica [Pregabalin] Swelling  . Neurontin [Gabapentin] Swelling  . Morphine And Related Other (See Comments)    MORPHINE DRIP - causes pt to feel irritable and itch  . Ceclor [Cefaclor] Rash  . Other Other (See Comments)    Redness from tape and bandaides  .  Sulfa Antibiotics Rash   Family History  Problem Relation Age of Onset  . Cancer Mother 36       liver cancer, hep c  . Heart disease Mother        chf  . Hypertension Mother   . Hyperlipidemia Mother   . Osteoporosis Mother   . Cancer Father 51       lung; smoker  . COPD Father   . Heart disease Sister        mvp  . Breast cancer Sister        dx. 25s  . Non-Hodgkin's lymphoma Sister 33       large B cell  . Hypertension Brother   . Arthritis Brother   . Kidney disease Brother   . Prostate cancer Brother        dx. early 38s  . Other Daughter        Beal's Syndrome  . Arthritis Daughter        Beal's  . Arthritis Son        Beal's connective tissue disease  . Vision loss Maternal Grandmother   . Dementia Maternal Grandmother   . Coronary artery disease Maternal Grandmother   . Heart Problems Maternal Grandmother   . Vision loss Paternal Grandfather   . Hypertension Brother   . Benign prostatic hyperplasia Brother   . Leukemia Brother        chronic lymphatic leukemia - small/B cell  . Hypertension Brother   . COPD Brother   . Prostate cancer Brother        dx. mid-60s  . Myelodysplastic syndrome Brother        dx. early 39s  . Hypertension Brother   . Hyperlipidemia Brother   . Prostate cancer Brother        dx. mid-70s  . Melanoma Brother        (x2) melanomas dx. late 60s-early 8s  . Mental illness Brother        schizophrenia  . Breast cancer Brother  dx. 54s  . Cirrhosis Brother        hep c  . Hypertension Sister   . Other Sister        thalessemia, anemia; hx of hysterectomy in her 50s  . Hyperlipidemia Sister   . Gout Sister   . Diverticulitis Sister   . Other Daughter        overweight  . Breast cancer Maternal Aunt 65  . Lung cancer Cousin        maternal 1st cousin dx. 40 or younger; former smoker  . Colon cancer Cousin        maternal 1st cousin dx. late 61s-early 60s  . Cancer Cousin        maternal 1st cousin d. NOS cancer    . Emphysema Paternal Aunt   . Lung cancer Paternal Aunt        d. early 51s; smoker  . Leukemia Cousin        paternal 1st cousin d. early 73s  . Colon cancer Other 44       niece  . Melanoma Other        niece   Social History   Socioeconomic History  . Marital status: Married    Spouse name: None  . Number of children: None  . Years of education: None  . Highest education level: None  Social Needs  . Financial resource strain: None  . Food insecurity - worry: None  . Food insecurity - inability: None  . Transportation needs - medical: None  . Transportation needs - non-medical: None  Occupational History  . None  Tobacco Use  . Smoking status: Former Smoker    Packs/day: 0.50    Years: 42.00    Pack years: 21.00    Types: Cigarettes    Last attempt to quit: 10/30/2015    Years since quitting: 1.6  . Smokeless tobacco: Never Used  Substance and Sexual Activity  . Alcohol use: No    Alcohol/week: 0.0 oz  . Drug use: No  . Sexual activity: No  Other Topics Concern  . None  Social History Narrative  . None   Depression screen [facility] 2/9 07/10/2017 04/20/2017 01/24/2017 11/30/2016 11/26/2016  Decreased Interest 0 0 0 0 0  Down, Depressed, Hopeless 0 0 0 0 0  PHQ - 2 Score 0 0 0 0 0  Some recent data might be hidden    Review of Systems  Constitutional: Positive for chills (improved), fatigue and fever (low grade). Negative for unexpected weight change.  HENT: Positive for congestion.   Respiratory: Positive for cough and shortness of breath (improved).   Gastrointestinal: Negative for constipation, diarrhea, nausea and vomiting.  Neurological: Negative for dizziness, weakness and headaches.       Objective:   Physical Exam  Constitutional: She is oriented to person, place, and time. She appears well-developed and well-nourished. No distress.  HENT:  Head: Normocephalic and atraumatic.  Ears: canals irritated  Eyes: EOM are normal. Pupils are equal, round, and  reactive to light.  Neck: Neck supple.  Cardiovascular: Normal rate, regular rhythm, S1 normal, S2 normal and normal heart sounds.  No murmur heard. Pulmonary/Chest: Effort normal and breath sounds normal. No respiratory distress. She has no wheezes.  Musculoskeletal: Normal range of motion.  Neurological: She is alert and oriented to person, place, and time.  Skin: Skin is warm and dry.  Psychiatric: She has a normal mood and affect. Her behavior is normal.  Nursing note and  vitals reviewed.   BP (!) 145/79 (BP Location: Right Arm, Patient Position: Sitting, Cuff Size: Large)   Pulse 88   Temp 98.2 F (36.8 C) (Oral)   Resp 18   Ht 5' 3.7" (1.618 m)   Wt 230 lb 12.8 oz (104.7 kg)   SpO2 99%   BMI 39.99 kg/m   Dg Chest 2 View  Result Date: 07/10/2017 CLINICAL DATA:  Cough and congestion EXAM: CHEST - 2 VIEW COMPARISON:  Chest CT 06/01/2016 FINDINGS: The heart size and mediastinal contours are within normal limits. Both lungs are clear. The visualized skeletal structures are unremarkable. IMPRESSION: No active cardiopulmonary disease. Electronically Signed   By: Ulyses Jarred M.D.   On: 07/10/2017 18:37       Assessment & Plan:   1. Cough   2. COPD exacerbation ([place])   3. Pneumonia of right lower lobe due to infectious organism [facility])     Orders Placed This Encounter  Procedures  . DG Chest 2 View    Standing Status:   Future    Number of Occurrences:   1    Standing Expiration Date:   07/10/2018    Order Specific Question:   Reason for Exam (SYMPTOM  OR DIAGNOSIS REQUIRED)    Answer:   h/o COPD. cough, congestion x 10d, minimal improvement with prednisone and biaxin, duonebs qid    Order Specific Question:   Preferred imaging location?    Answer:   External    Meds ordered this encounter  Medications  . cefdinir (OMNICEF) 300 MG capsule    Sig: Take 2 capsules (600 mg total) by mouth daily.    Dispense:  14 capsule    Refill:  0    I personally performed the  services described in this documentation, which was scribed in my presence. The recorded information has been reviewed and considered, and addended by me as needed.   [name], M.D.  Primary Care at [facility] [address] [place], [place] [address] [phone] phone [phone] fax  07/13/17 9:54 AM

## 2017-07-10 NOTE — Patient Instructions (Addendum)
IF you received an x-ray today, you will receive an invoice from [facility]. Please contact [facility] at [phone] with questions or concerns regarding your invoice.   IF you received labwork today, you will receive an invoice from [place]. Please contact [facility] at [phone] with questions or concerns regarding your invoice.   Our billing staff will not be able to assist you with questions regarding bills from these companies.  You will be contacted with the lab results as soon as they are available. The fastest way to get your results is to activate your My Chart account. Instructions are located on the last page of this paperwork. If you have not heard from [place] regarding the results in 2 weeks, please contact this office.     Community-Acquired Pneumonia, Adult Pneumonia is an infection of the lungs. One type of pneumonia can happen while a person is in a hospital. A different type can happen when a person is not in a hospital (community-acquired pneumonia). It is easy for this kind to spread from person to person. It can spread to you if you breathe near an infected person who coughs or sneezes. Some symptoms include:  A dry cough.  A wet (productive) cough.  Fever.  Sweating.  Chest pain.  Follow these instructions at home:  Take over-the-counter and prescription medicines only as told by your doctor. ? Only take cough medicine if you are losing sleep. ? If you were prescribed an antibiotic medicine, take it as told by your doctor. Do not stop taking the antibiotic even if you start to feel better.  Sleep with your head and neck raised (elevated). You can do this by putting a few pillows under your head, or you can sleep in a recliner.  Do not use tobacco products. These include cigarettes, chewing tobacco, and e-cigarettes. If you need help quitting, ask your doctor.  Drink enough water to keep your pee (urine) clear or pale yellow. A shot  (vaccine) can help prevent pneumonia. Shots are often suggested for:  People older than 67 years of age.  People older than 67 years of age: ? Who are having cancer treatment. ? Who have long-term (chronic) lung disease. ? Who have problems with their body's defense system (immune system).  You may also prevent pneumonia if you take these actions:  Get the flu (influenza) shot every year.  Go to the dentist as often as told.  Wash your hands often. If soap and water are not available, use hand sanitizer.  Contact a doctor if:  You have a fever.  You lose sleep because your cough medicine does not help. Get help right away if:  You are short of breath and it gets worse.  You have more chest pain.  Your sickness gets worse. This is very serious if: ? You are an older adult. ? Your body's defense system is weak.  You cough up blood. This information is not intended to replace advice given to you by your health care provider. Make sure you discuss any questions you have with your health care provider. Document Released: 10/10/2007 Document Revised: 09/29/2015 Document Reviewed: 08/18/2014 Elsevier Interactive Patient Education  2018 [employer].  Bronchospasm, Adult Bronchospasm is a tightening of the airways going into the lungs. During an episode, it may be harder to breathe. You may cough, and you may make a whistling sound when you breathe (wheeze). This condition often affects people with asthma. What are the causes? This condition  is caused by swelling and irritation in the airways. It can be triggered by:  An infection (common).  Seasonal allergies.  An allergic reaction.  Exercise.  Irritants. These include pollution, cigarette smoke, strong odors, aerosol sprays, and paint fumes.  Weather changes. Winds increase molds and pollens in the air. Cold air may cause swelling.  Stress and emotional upset.  What are the signs or symptoms? Symptoms of this  condition include:  Wheezing. If the episode was triggered by an allergy, wheezing may start right away or hours later.  Nighttime coughing.  Frequent or severe coughing with a simple cold.  Chest tightness.  Shortness of breath.  Decreased ability to exercise.  How is this diagnosed? This condition is usually diagnosed with a review of your medical history and a physical exam. Tests, such as lung function tests, are sometimes done to look for other conditions. The need for a chest X-ray depends on where the wheezing occurs and whether it is the first time you have wheezed. How is this treated? This condition may be treated with:  Inhaled medicines. These open up the airways and help you breathe. They can be taken with an inhaler or a nebulizer device.  Corticosteroid medicines. These may be given for severe bronchospasm, usually when it is associated with asthma.  Avoiding triggers, such as irritants, infection, or allergies.  Follow these instructions at home: Medicines  Take over-the-counter and prescription medicines only as told by your health care provider.  If you need to use an inhaler or nebulizer to take your medicine, ask your health care provider to explain how to use it correctly. If you were given a spacer, always use it with your inhaler. Lifestyle  Reduce the number of triggers in your home. To do this: ? Change your heating and air conditioning filter at least once a month. ? Limit your use of fireplaces and wood stoves. ? Do not smoke. Do not allow smoking in your home. ? Avoid using perfumes and fragrances. ? Get rid of pests, such as roaches and mice, and their droppings. ? Remove any mold from your home. ? Keep your house clean and dust free. Use unscented cleaning products. ? Replace carpet with wood, tile, or vinyl flooring. Carpet can trap dander and dust. ? Use allergy-proof pillows, mattress covers, and box spring covers. ? Wash bed sheets and  blankets every week in hot water. Dry them in a dryer. ? Use blankets that are made of polyester or cotton. ? Wash your hands often. ? Do not allow pets in your bedroom.  Avoid breathing in cold air when you exercise. General instructions  Have a plan for seeking medical care. Know when to call your health care provider and local emergency services, and where to get emergency care.  Stay up to date on your immunizations.  When you have an episode of bronchospasm, stay calm. Try to relax and breathe more slowly.  If you have asthma, make sure you have an asthma action plan.  Keep all follow-up visits as told by your health care provider. This is important. Contact a health care provider if:  You have muscle aches.  You have chest pain.  The mucus that you cough up (sputum) changes from clear or white to yellow, green, gray, or bloody.  You have a fever.  Your sputum gets thicker. Get help right away if:  Your wheezing and coughing get worse, even after you take your prescribed medicines.  It gets even harder  to breathe.  You develop severe chest pain. Summary  Bronchospasm is a tightening of the airways going into the lungs.  During an episode of bronchospasm, you may have a harder time breathing. You may cough and make a whistling sound when you breathe (wheeze).  Avoid exposure to triggers such as smoke, dust, mold, animal dander, and fragrances.  When you have an episode of bronchospasm, stay calm. Try to relax and breathe more slowly. This information is not intended to replace advice given to you by your health care provider. Make sure you discuss any questions you have with your health care provider. Document Released: 04/26/2003 Document Revised: 04/19/2016 Document Reviewed: 04/19/2016 Elsevier Interactive Patient Education  2017 [employer].

## 2017-07-25 ENCOUNTER — Ambulatory Visit: Payer: 59 | Admitting: Family Medicine

## 2017-07-25 ENCOUNTER — Encounter: Payer: Self-pay | Admitting: Family Medicine

## 2017-07-25 ENCOUNTER — Other Ambulatory Visit: Payer: Self-pay

## 2017-07-25 VITALS — BP 112/70 | HR 84 | Temp 98.9°F | Resp 18 | Ht 63.7 in | Wt 231.6 lb

## 2017-07-25 DIAGNOSIS — E1169 Type 2 diabetes mellitus with other specified complication: Secondary | ICD-10-CM | POA: Diagnosis not present

## 2017-07-25 DIAGNOSIS — J069 Acute upper respiratory infection, unspecified: Secondary | ICD-10-CM

## 2017-07-25 DIAGNOSIS — L739 Follicular disorder, unspecified: Secondary | ICD-10-CM | POA: Diagnosis not present

## 2017-07-25 DIAGNOSIS — E039 Hypothyroidism, unspecified: Secondary | ICD-10-CM | POA: Diagnosis not present

## 2017-07-25 DIAGNOSIS — E118 Type 2 diabetes mellitus with unspecified complications: Secondary | ICD-10-CM | POA: Diagnosis not present

## 2017-07-25 DIAGNOSIS — E785 Hyperlipidemia, unspecified: Secondary | ICD-10-CM

## 2017-07-25 NOTE — Progress Notes (Signed)
Subjective:   07/25/2017 at 11:13 AM.   Patient ID: Susan Susan [name], female    DOB: [birth year], 67 y.o.   MRN: [id number]  Chief Complaint  Patient presents with  . Cyst    left labia, pt states cyst appears to have ruptured yesterday. Pt states pain has dulled a little bit. Pt states there was a little bit of discharge. Pt states she does not want shots given.   HPI  Susan Susan [name] is a 67 y.o. female who presents to Primary Care at [facility] complaining of a cyst to the L labia that ruptured yesterday. Pt reports that pain to the area has improved some and she noticed some drainage from the area.   ALso developed URI sxs with sore throat and congestion several days ago - didn't want to get a recurrent of prolonged pneumonia Susan [name] she started the [facility] that she was rx'd for the pneumonia and never ended up taking.   Past Medical History:  Diagnosis Date  . Anemia 01/28/2012  . Anxiety   . Cancer [facility]) 2004   ductal carcinoma; lumpectomy  . Cellulitis of chest wall 12/09/2015  . Complication of anesthesia    difficulty voiding after anesthesia  . COPD (chronic obstructive pulmonary disease) (HCC)    mild  . DDD (degenerative disc disease)   . Depression   . Diabetes mellitus without complication ([place])   . DJD (degenerative joint disease)   . Dysrhythmia    hx palpitations?panic attack  . Fatty liver disease, nonalcoholic   . Fibromyalgia   . GERD (gastroesophageal reflux disease)   . Hip pain, right 10/12/2013  . History of blood clots 2004   during cancer treatment  . HTN (hypertension) 01/28/2012  . Hyperglycemia 01/28/2012  . Hyperlipidemia 01/28/2012  . Hypertension   . Hypothyroidism 02/14/2014  . Muscle spasm 12/30/2011  . Nasal polyp 08/30/2012  . Obesity   . Osteoarthritis   . Osteopenia   . Pain of left heel 01/28/2012  . Peripheral neuropathy   . Peripheral vascular disease ([place]) 04   axillary,upper chest after 3 weeks on tamoxifen  . Pneumonia    hx  .  Preventative health care 01/28/2012  . Shortness of breath dyspnea    occ on exersion  . Sleep apnea    on CPAP  . Tobacco abuse-unspec 10/12/2013  . Tuberculosis    latent  finishing 9 months tx on last month   Current Outpatient Medications on File Prior to Visit  Medication Sig Dispense Refill  . ADVAIR DISKUS 100-50 MCG/DOSE AEPB INHALE 1 PUFF INTO THE LUNGS 2 (TWO) TIMES DAILY. 60 each 0  . albuterol (PROVENTIL HFA;VENTOLIN HFA) 108 (90 Base) MCG/ACT inhaler INHALE 2 PUFFS INTO THE LUNGS EVERY 4 HOURS AS NEEDED FOR WHEEZING OR SHORTNESS OF BREATH OR COUGH 18 Inhaler 0  . amitriptyline (ELAVIL) 25 MG tablet Take 1 tablet (25 mg total) by mouth at bedtime. 90 tablet 3  . Ascorbic Acid (VITAMIN C PO) Take 1 tablet by mouth 3 (three) times daily.    Marland Kitchen aspirin EC 81 MG tablet Take 81 mg by mouth daily.    . cefdinir (OMNICEF) 300 MG capsule Take 2 capsules (600 mg total) by mouth daily. 14 capsule 0  . cholecalciferol (VITAMIN D) 1000 UNITS tablet Take 1,000 Units by mouth daily.    . clarithromycin (BIAXIN) 250 MG tablet Take 1 tablet (250 mg total) by mouth 2 (two) times daily. 20 tablet 0  . DULoxetine (CYMBALTA)  30 MG capsule TAKE 3 CAPSULES BY MOUTH EVERY DAY 270 capsule 1  . esomeprazole (NEXIUM) 20 MG capsule Take 20 mg by mouth daily at 12 noon.    . fluticasone (FLONASE) 50 MCG/ACT nasal spray PLACE 2 SPRAYS INTO BOTH NOSTRILS AT BEDTIME. 16 g 0  . hydrochlorothiazide (HYDRODIURIL) 25 MG tablet TAKE 1 TABLET BY MOUTH EVERY DAY 90 tablet 0  . ibuprofen (ADVIL,MOTRIN) 200 MG tablet Take 800 mg by mouth at bedtime.    Marland Kitchen ipratropium (ATROVENT) 0.03 % nasal spray Place 2 sprays into the nose 4 (four) times daily. 30 mL 1  . levothyroxine (SYNTHROID, LEVOTHROID) 50 MCG tablet Take 1 tablet (50 mcg total) by mouth daily before breakfast. 90 tablet 1  . metFORMIN (GLUCOPHAGE) 500 MG tablet Take 1 tablet (500 mg total) by mouth 2 (two) times daily with a meal. 180 tablet 1  . metoprolol  tartrate (LOPRESSOR) 50 MG tablet Take 1 tablet (50 mg total) by mouth 2 (two) times daily. 180 tablet 3  . Multiple Vitamins-Minerals (MULTIVITAMIN WITH MINERALS) tablet Take 1 tablet by mouth daily.    . pravastatin (PRAVACHOL) 40 MG tablet Take 1 tablet (40 mg total) by mouth daily. 90 tablet 0  . temazepam (RESTORIL) 15 MG capsule TAKE ONE CAPSULE BY MOUTH AT BEDTIME AS NEEDED FOR SLEEP 30 capsule 3  . tiZANidine (ZANAFLEX) 4 MG tablet Take 1 tablet (4 mg total) by mouth every 8 (eight) hours as needed for muscle spasms. 90 tablet 1  . azithromycin (ZITHROMAX) 250 MG tablet Take 2 tabs PO x 1 dose, then 1 tab PO QD x 4 days (Patient not taking: Reported on 07/10/2017) 6 tablet 0  . benzonatate (TESSALON) 200 MG capsule Take 1 capsule (200 mg total) by mouth 3 (three) times daily as needed for cough. (Patient not taking: Reported on 07/25/2017) 40 capsule 0  . predniSONE (DELTASONE) 20 MG tablet Take 2 tablets (40 mg total) by mouth daily with breakfast. (Patient not taking: Reported on 07/10/2017) 10 tablet 0  . predniSONE (DELTASONE) 50 MG tablet Take 1 tablet (50 mg total) by mouth daily with breakfast. (Patient not taking: Reported on 07/10/2017) 5 tablet 0   No current facility-administered medications on file prior to visit.    Past Surgical History:  Procedure Laterality Date  . ABDOMINAL HYSTERECTOMY  1991  . APPENDECTOMY  91  . BILATERAL OOPHORECTOMY  01/2003  . BREAST SURGERY Left 2004   lumpectomy  . CARPAL TUNNEL RELEASE     right  . INCISION AND DRAINAGE ABSCESS Left 12/13/2015   Procedure: DRAINAGE LEFT MASTECTOMY WOUND INFECTION;  Surgeon: Fanny Skates, MD;  Location: [place];  Service: General;  Laterality: Left;  . JOINT REPLACEMENT     right total knee  . MASTECTOMY W/ SENTINEL NODE BIOPSY Right 10/26/2015  . MASTECTOMY W/ SENTINEL NODE BIOPSY Bilateral 10/26/2015   Procedure: RIGHT TOTAL MASTECTOMY WITH RIGHT SENTINEL LYMPH NODE BIOPSY, INJECT BLUE DYE RIGHT BREAST, LEFT BREAST  PROPHYLACTIC MASTECTOMY;  Surgeon: Fanny Skates, MD;  Location: [place];  Service: General;  Laterality: Bilateral;  . NASAL SEPTUM SURGERY  08  . PILONIDAL CYST EXCISION    . THYROIDECTOMY, PARTIAL  mid 80's   right side   Allergies  Allergen Reactions  . Chantix [Varenicline] Other (See Comments)    "Felt like having mental breakdown"  . Ciprofloxacin Other (See Comments)    Muscle tightness, tendon aching and pain  . Penicillins Anaphylaxis and Swelling  . Tamoxifen Other (  See Comments)    Blood clots  . Levaquin [Levofloxacin] Other (See Comments)    Severe tendon pain  . Lyrica [Pregabalin] Swelling  . Neurontin [Gabapentin] Swelling  . Morphine And Related Other (See Comments)    MORPHINE DRIP - causes pt to feel irritable and itch  . Ceclor [Cefaclor] Rash  . Other Other (See Comments)    Redness from tape and bandaides  . Sulfa Antibiotics Rash   Family History  Problem Relation Age of Onset  . Cancer Mother 65       liver cancer, hep c  . Heart disease Mother        chf  . Hypertension Mother   . Hyperlipidemia Mother   . Osteoporosis Mother   . Cancer Father 70       lung; smoker  . COPD Father   . Heart disease Sister        mvp  . Breast cancer Sister        dx. 73s  . Non-Hodgkin's lymphoma Sister 54       large B cell  . Hypertension Brother   . Arthritis Brother   . Kidney disease Brother   . Prostate cancer Brother        dx. early 75s  . Other Daughter        Beal's Syndrome  . Arthritis Daughter        Beal's  . Arthritis Son        Beal's connective tissue disease  . Vision loss Maternal Grandmother   . Dementia Maternal Grandmother   . Coronary artery disease Maternal Grandmother   . Heart Problems Maternal Grandmother   . Vision loss Paternal Grandfather   . Hypertension Brother   . Benign prostatic hyperplasia Brother   . Leukemia Brother        chronic lymphatic leukemia - small/B cell  . Hypertension Brother   . COPD Brother     . Prostate cancer Brother        dx. mid-60s  . Myelodysplastic syndrome Brother        dx. early 55s  . Hypertension Brother   . Hyperlipidemia Brother   . Prostate cancer Brother        dx. mid-70s  . Melanoma Brother        (x2) melanomas dx. late 60s-early 3s  . Mental illness Brother        schizophrenia  . Breast cancer Brother        dx. 69s  . Cirrhosis Brother        hep c  . Hypertension Sister   . Other Sister        thalessemia, anemia; hx of hysterectomy in her 64s  . Hyperlipidemia Sister   . Gout Sister   . Diverticulitis Sister   . Other Daughter        overweight  . Breast cancer Maternal Aunt 72  . Lung cancer Cousin        maternal 1st cousin dx. 1 or younger; former smoker  . Colon cancer Cousin        maternal 1st cousin dx. late 58s-early 60s  . Cancer Cousin        maternal 1st cousin d. NOS cancer  . Emphysema Paternal Aunt   . Lung cancer Paternal Aunt        d. early 31s; smoker  . Leukemia Cousin        paternal 1st cousin d. early 47s  .  Colon cancer Other 49       niece  . Melanoma Other        niece   Social History   Socioeconomic History  . Marital status: Married    Spouse name: Not on file  . Number of children: Not on file  . Years of education: Not on file  . Highest education level: Not on file  Occupational History  . Not on file  Social Needs  . Financial resource strain: Not on file  . Food insecurity:    Worry: Not on file    Inability: Not on file  . Transportation needs:    Medical: Not on file    Non-medical: Not on file  Tobacco Use  . Smoking status: Former Smoker    Packs/day: 0.50    Years: 42.00    Pack years: 21.00    Types: Cigarettes    Last attempt to quit: 10/30/2015    Years since quitting: 1.8  . Smokeless tobacco: Never Used  Substance and Sexual Activity  . Alcohol use: No    Alcohol/week: 0.0 oz  . Drug use: No  . Sexual activity: Never  Lifestyle  . Physical activity:    Days per  week: Not on file    Minutes per session: Not on file  . Stress: Not on file  Relationships  . Social connections:    Talks on phone: Not on file    Gets together: Not on file    Attends religious service: Not on file    Active member of club or organization: Not on file    Attends meetings of clubs or organizations: Not on file    Relationship status: Not on file  Other Topics Concern  . Not on file  Social History Narrative  . Not on file   Depression screen [facility] 2/9 07/25/2017 07/10/2017 04/20/2017 01/24/2017 11/30/2016  Decreased Interest 0 0 0 0 0  Down, Depressed, Hopeless 0 0 0 0 0  PHQ - 2 Score 0 0 0 0 0  Some recent data might be hidden       Review of Systems  Constitutional: Positive for activity change, appetite change and fatigue. Negative for chills, diaphoresis, fever and unexpected weight change.  HENT: Positive for congestion, rhinorrhea and sore throat. Negative for ear pain, facial swelling, sinus pressure, sinus pain, trouble swallowing and voice change.   Respiratory: Positive for cough.   Gastrointestinal: Negative for abdominal pain, constipation, diarrhea, nausea and vomiting.  Skin: Positive for wound. Negative for color change and rash.  Allergic/Immunologic: Negative for immunocompromised state.  Hematological: Negative for adenopathy.      Objective:   Physical Exam  Constitutional: She is oriented to person, place, and time. She appears well-developed and well-nourished. No distress.  HENT:  Head: Normocephalic and atraumatic.  Eyes: Conjunctivae and EOM are normal.  Neck: Neck supple. No tracheal deviation present.  Cardiovascular: Normal rate.  Pulmonary/Chest: Effort normal. No respiratory distress.  Musculoskeletal: Normal range of motion.  Neurological: She is alert and oriented to person, place, and time.  Skin: Skin is warm and dry.  Psychiatric: She has a normal mood and affect. Her behavior is normal.  Nursing note and vitals  reviewed.  Vitals:   07/25/17 1025  BP: 112/70  Pulse: 84  Resp: 18  Temp: 98.9 F (37.2 C)  TempSrc: Oral  SpO2: 97%  Weight: 231 lb 9.6 oz (105.1 kg)  Height: 5' 3.7" (1.618 m)  Assessment & Plan:   1. Folliculitis of perineum - already started omnicef that she had at home from prior illness for this and URI - advised poor antibiotic coverage for this as pt has h/o MRSA which is why she should only use abx when prescribed for condition - pt understands and apologetic -finish course since sev days in could be covering as long as not MRSA. Start moist warm compresses and top mupirocin 4x/d for new sev d, RTC if worsens.  2. Controlled type 2 diabetes mellitus with complication, without long-term current use of insulin ([place])   3. Hypothyroidism, unspecified type   4. Hyperlipidemia associated with type 2 diabetes mellitus ([place])   5. Upper respiratory tract infection, unspecified type - likely viral but pt already on sev d of omnicef Susan [name] finish course  Order future labs for next f/u OV in 1 mo for review of chronic medical conditions - come in sev d prior for labs Susan [name] we can review at [place].  Orders Placed This Encounter  Procedures  . Lipid panel    Standing Status:   Future    Number of Occurrences:   1    Standing Expiration Date:   07/26/2018    Order Specific Question:   Has the patient fasted?    Answer:   Yes  . TSH    Standing Status:   Future    Number of Occurrences:   1    Standing Expiration Date:   07/26/2018  . Comprehensive metabolic panel    Standing Status:   Future    Number of Occurrences:   1    Standing Expiration Date:   07/26/2018    Order Specific Question:   Has the patient fasted?    Answer:   Yes  . Hemoglobin A1c    Standing Status:   Future    Number of Occurrences:   1    Standing Expiration Date:   07/26/2018     I personally performed the services described in this documentation, which was scribed in my presence. The recorded information has  been reviewed and considered, and addended by me as needed.   [name], M.D.  Primary Care at [facility] [address] [place], [place] [address] [phone] phone [phone] fax  09/03/17 11:50 PM

## 2017-07-25 NOTE — Patient Instructions (Addendum)
Finish the course of omnicef - I think that it is probably helping BOTH things. For today and tomorrow be dedicated to wet warm compresses or soaks at least 4x/d followed by topical mupirocin/bactroban.  If we continue to get the labial cysts, we could consider an attempt for MRSA eradication.  Please come by the office to have your FASTING labs checked in about 3-4 weeks and then make a follow-up appointment with me to discuss the results in about 4 weeks (several days later). (You can walk-in to the [place] clinic for this and let the front desk that you are there for a lab-only visit and you do not need to see a provider.)   IF you received an x-ray today, you will receive an invoice from [facility]. Please contact [facility] at [phone] with questions or concerns regarding your invoice.   IF you received labwork today, you will receive an invoice from [place]. Please contact [facility] at [phone] with questions or concerns regarding your invoice.   Our billing staff will not be able to assist you with questions regarding bills from these companies.  You will be contacted with the lab results as soon as they are available. The fastest way to get your results is to activate your My Chart account. Instructions are located on the last page of this paperwork. If you have not heard from [place] regarding the results in 2 weeks, please contact this office.     Folliculitis Folliculitis is inflammation of the hair follicles. Folliculitis most commonly occurs on the scalp, thighs, legs, back, and buttocks. However, it can occur anywhere on the body. What are the causes? This condition may be caused by:  A bacterial infection (common).  A fungal infection.  A viral infection.  Coming into contact with certain chemicals, especially oils and tars.  Shaving or waxing.  Applying greasy ointments or creams to your skin often.  Long-lasting folliculitis and  folliculitis that keeps coming back can be caused by bacteria that live in the nostrils. What increases the risk? This condition is more likely to develop in people with:  A weakened immune system.  Diabetes.  Obesity.  What are the signs or symptoms? Symptoms of this condition include:  Redness.  Soreness.  Swelling.  Itching.  Small white or yellow, pus-filled, itchy spots (pustules) that appear over a reddened area. If there is an infection that goes deep into the follicle, these may develop into a boil (furuncle).  A group of closely packed boils (carbuncle). These tend to form in hairy, sweaty areas of the body.  How is this diagnosed? This condition is diagnosed with a skin exam. To find what is causing the condition, your health care provider may take a sample of one of the pustules or boils for testing. How is this treated? This condition may be treated by:  Applying warm compresses to the affected areas.  Taking an antibiotic medicine or applying an antibiotic medicine to the skin.  Applying or bathing with an antiseptic solution.  Taking an over-the-counter medicine to help with itching.  Having a procedure to drain any pustules or boils. This may be done if a pustule or boil contains a lot of pus or fluid.  Laser hair removal. This may be done to treat long-lasting folliculitis.  Follow these instructions at home:  If directed, apply heat to the affected area as often as told by your health care provider. Use the heat source that your health care provider recommends,  such as a moist heat pack or a heating pad. ? Place a towel between your skin and the heat source. ? Leave the heat on for 20-30 minutes. ? Remove the heat if your skin turns bright red. This is especially important if you are unable to feel pain, heat, or cold. You may have a greater risk of getting burned.  If you were prescribed an antibiotic medicine, use it as told by your health care  provider. Do not stop using the antibiotic even if you start to feel better.  Take over-the-counter and prescription medicines only as told by your health care provider.  Do not shave irritated skin.  Keep all follow-up visits as told by your health care provider. This is important. Get help right away if:  You have more redness, swelling, or pain in the affected area.  Red streaks are spreading from the affected area.  You have a fever. This information is not intended to replace advice given to you by your health care provider. Make sure you discuss any questions you have with your health care provider. Document Released: 07/02/2001 Document Revised: 11/11/2015 Document Reviewed: 02/11/2015 Elsevier Interactive Patient Education  2018 [employer].

## 2017-08-06 ENCOUNTER — Encounter (HOSPITAL_COMMUNITY): Payer: Self-pay

## 2017-08-06 ENCOUNTER — Emergency Department (HOSPITAL_COMMUNITY)
Admission: EM | Admit: 2017-08-06 | Discharge: 2017-08-06 | Disposition: A | Discharge status: Home / Self Care | Payer: 59 | Attending: Emergency Medicine | Admitting: Emergency Medicine

## 2017-08-06 ENCOUNTER — Ambulatory Visit: Payer: Self-pay

## 2017-08-06 ENCOUNTER — Emergency Department (HOSPITAL_COMMUNITY): Payer: 59

## 2017-08-06 DIAGNOSIS — M5412 Radiculopathy, cervical region: Secondary | ICD-10-CM

## 2017-08-06 DIAGNOSIS — E039 Hypothyroidism, unspecified: Secondary | ICD-10-CM | POA: Insufficient documentation

## 2017-08-06 DIAGNOSIS — J449 Chronic obstructive pulmonary disease, unspecified: Secondary | ICD-10-CM | POA: Insufficient documentation

## 2017-08-06 DIAGNOSIS — I10 Essential (primary) hypertension: Secondary | ICD-10-CM | POA: Insufficient documentation

## 2017-08-06 DIAGNOSIS — Z87891 Personal history of nicotine dependence: Secondary | ICD-10-CM | POA: Insufficient documentation

## 2017-08-06 DIAGNOSIS — Z79899 Other long term (current) drug therapy: Secondary | ICD-10-CM | POA: Diagnosis not present

## 2017-08-06 DIAGNOSIS — E119 Type 2 diabetes mellitus without complications: Secondary | ICD-10-CM | POA: Diagnosis not present

## 2017-08-06 LAB — CBC WITH DIFFERENTIAL/PLATELET
BASOS ABS: 0 10*3/uL (ref 0.0–0.1)
BASOS PCT: 0 %
EOS PCT: 4 %
Eosinophils Absolute: 0.3 10*3/uL (ref 0.0–0.7)
HCT: 37.7 % (ref 36.0–46.0)
Hemoglobin: 12.8 g/dL (ref 12.0–15.0)
LYMPHS PCT: 58 %
Lymphs Abs: 4.5 10*3/uL — ABNORMAL HIGH (ref 0.7–4.0)
MCH: 28.8 pg (ref 26.0–34.0)
MCHC: 34 g/dL (ref 30.0–36.0)
MCV: 84.9 fL (ref 78.0–100.0)
MONO ABS: 0.5 10*3/uL (ref 0.1–1.0)
Monocytes Relative: 7 %
NEUTROS ABS: 2.4 10*3/uL (ref 1.7–7.7)
Neutrophils Relative %: 31 %
PLATELETS: 240 10*3/uL (ref 150–400)
RBC: 4.44 MIL/uL (ref 3.87–5.11)
RDW: 12.8 % (ref 11.5–15.5)
WBC: 7.8 10*3/uL (ref 4.0–10.5)

## 2017-08-06 LAB — I-STAT TROPONIN, ED: TROPONIN I, POC: 0 ng/mL (ref 0.00–0.08)

## 2017-08-06 LAB — BASIC METABOLIC PANEL
ANION GAP: 12 (ref 5–15)
BUN: 10 mg/dL (ref 6–20)
CALCIUM: 9.2 mg/dL (ref 8.9–10.3)
CO2: 23 mmol/L (ref 22–32)
Chloride: 101 mmol/L (ref 101–111)
Creatinine, Ser: 0.78 mg/dL (ref 0.44–1.00)
GFR calc Af Amer: >60 mL/min (ref >60)
Glucose, Bld: 99 mg/dL (ref 65–99)
POTASSIUM: 3.5 mmol/L (ref 3.5–5.1)
Sodium: 136 mmol/L (ref 135–145)

## 2017-08-06 NOTE — Telephone Encounter (Signed)
Pt calling with 2 complaints: HTN and left upper arm cramping pain that wakes her up only at night. She states that the pain is intermittent and when it occurs it lasts for 10-15 minutes. She denies chest pain, weakness or numbness on either side of her face or body. She denies radiation. She also c/o pain at the base of her skull during NT call.  She forgot to take her BP meds today and 30 minutes after taking her BP med her BP was 183/92 with a HR of 100.  Repeat BP during call was 188/90 with a HR of 99. Pt states she has many lipomas that she thinks is causing her pain. With her BP being greater than 701 (systolic) and HR 99 and the pain she describes, pt advised to go to the ED. Husband will drive her to [facility] ED.  Reason for Disposition . [7] Systolic BP  >= 793 OR Diastolic >= 903 AND [0] cardiac or neurologic symptoms (e.g., chest pain, difficulty breathing, unsteady gait, blurred vision)  Answer Assessment - Initial Assessment Questions 1. BLOOD PRESSURE: "What is the blood pressure?" "Did you take at least two measurements 5 minutes apart?"     183/92 and 188/90 HR 99 2. ONSET: "When did you take your blood pressure?"     4:30 and had pt repeat during call  3. HOW: "How did you obtain the blood pressure?" (e.g., visiting nurse, automatic home BP monitor)    Automatic BP machine 4. HISTORY: "Do you have a history of high blood pressure?"     yes 5. MEDICATIONS: "Are you taking any medications for blood pressure?" "Have you missed any doses recently?"     Yes  And yes to missed doses 6. OTHER SYMPTOMS: "Do you have any symptoms?" (e.g., headache, chest pain, blurred vision, difficulty breathing, weakness)     Left upper arm "cramping pain" that is intermittent and there is no radiation and only at night. Having neck pain hurting base of skull 7. PREGNANCY: "Is there any chance you are pregnant?" "When was your last menstrual period?"     n/a  Protocols used: HIGH BLOOD  PRESSURE-A-AH

## 2017-08-06 NOTE — ED Triage Notes (Signed)
Pt presents with onset of frontal headache that began today that was relieved with tylenol.  Pt reports she forgot to take her blood pressure medication x 2 today, took her evening dose today. Pt took her pressure (188/92)  Pt reports having lypomas to L arm x 1 year with pain at night to L arm x 2 months.  Pt also reports having "neck issues" for years.  Pt called her PCP and was referred here "for all of that".

## 2017-08-06 NOTE — ED Provider Notes (Signed)
[facility] EMERGENCY DEPARTMENT Provider Note   CSN: [id number] Arrival date & time: 08/06/17  1808     History   Chief Complaint Chief Complaint  Patient presents with  . Hypertension    HPI Susan [name] is a 67 y.o. female.  The history is provided by the patient. No language interpreter was used.   Susan [name] is a 67 y.o. female who presents to the Emergency Department complaining of HTN. This morning she forgot to take her blood pressure medicine. This afternoon she developed a sudden onset dull headache. She decided to check her blood pressure at that time and it was elevated at 329 systolic. She took Tylenol in her home blood pressure medicine. She then called the on-call nurse for her PCP. On ED evaluation she reports that her headache is completely resolved. This was not the worst headache of her life. She does report that she has been having chronic posterior neck pain as well as intermittent pain in her left arm. Pain is sharp in nature and it is worse when she is sleeping at night. She denies any chest pain, shortness of breath, fevers, cough. She has chronic lower extremity edema and this is unchanged from her baseline. When she mentions her elevated blood pressure as well as headache and arm pain she was referred to the emergency department for further evaluation. Past Medical History:  Diagnosis Date  . Anemia 01/28/2012  . Anxiety   . Cancer [facility]) 2004   ductal carcinoma; lumpectomy  . Cellulitis of chest wall 12/09/2015  . Complication of anesthesia    difficulty voiding after anesthesia  . COPD (chronic obstructive pulmonary disease) (HCC)    mild  . DDD (degenerative disc disease)   . Depression   . Diabetes mellitus without complication ([place])   . DJD (degenerative joint disease)   . Dysrhythmia    hx palpitations?panic attack  . Fatty liver disease, nonalcoholic   . Fibromyalgia   . GERD (gastroesophageal reflux disease)   .  Hip pain, right 10/12/2013  . History of blood clots 2004   during cancer treatment  . HTN (hypertension) 01/28/2012  . Hyperglycemia 01/28/2012  . Hyperlipidemia 01/28/2012  . Hypertension   . Hypothyroidism 02/14/2014  . Muscle spasm 12/30/2011  . Nasal polyp 08/30/2012  . Obesity   . Osteoarthritis   . Osteopenia   . Pain of left heel 01/28/2012  . Peripheral neuropathy   . Peripheral vascular disease ([place]) 04   axillary,upper chest after 3 weeks on tamoxifen  . Pneumonia    hx  . Preventative health care 01/28/2012  . Shortness of breath dyspnea    occ on exersion  . Sleep apnea    on CPAP  . Tobacco abuse-unspec 10/12/2013  . Tuberculosis    latent  finishing 9 months tx on last month    Patient Active Problem List   Diagnosis Date Noted  . Induration, breast   . Left shoulder pain   . Abscess 12/09/2015  . Cancer of central portion of right breast ([place]) 10/26/2015  . Genetic testing 10/17/2015  . History of breast cancer in female 09/15/2015  . Breast cancer of upper-outer quadrant of right female breast ([place]) 09/15/2015  . Family history of breast cancer in female 09/15/2015  . Family history of colon cancer 09/15/2015  . TB lung, latent 01/11/2015  . COPD (chronic obstructive pulmonary disease) ([place]) 10/07/2014  . Mediastinal lymphadenopathy 10/07/2014  . Diabetes mellitus type 2, controlled (  [place]) 09/29/2014  . Pulmonary nodule 09/29/2014  . History of chest pain 09/29/2014  . Essential hypertension 09/29/2014  . Chest pain 09/23/2014  . Hypothyroidism 02/14/2014  . Hip pain, right 10/12/2013  . Cervical cancer screening 03/09/2013  . Seasonal allergies 09/12/2012  . Nasal polyp 08/30/2012  . SOM (serous otitis media) 08/30/2012  . Dysuria 07/20/2012  . Peripheral neuropathy 07/19/2012  . Cervical myofascial pain syndrome 06/08/2012  . Anemia 01/28/2012  . Hyperlipidemia 01/28/2012  . Hyperglycemia 01/28/2012  . Preventative health care 01/28/2012  . Pain of  left heel 01/28/2012  . Neck pain 12/30/2011  . Muscle spasm 12/30/2011  . Anxiety and depression   . Osteoarthritis   . GERD (gastroesophageal reflux disease)   . Fibromyalgia   . Sleep apnea   . Obesity   . History of blood clots   . Fatty liver disease, nonalcoholic   . DDD (degenerative disc disease)     Past Surgical History:  Procedure Laterality Date  . ABDOMINAL HYSTERECTOMY  1991  . APPENDECTOMY  91  . BILATERAL OOPHORECTOMY  01/2003  . BREAST SURGERY Left 2004   lumpectomy  . CARPAL TUNNEL RELEASE     right  . INCISION AND DRAINAGE ABSCESS Left 12/13/2015   Procedure: DRAINAGE LEFT MASTECTOMY WOUND INFECTION;  Surgeon: Fanny Skates, MD;  Location: [facility];  Service: General;  Laterality: Left;  . JOINT REPLACEMENT     right total knee  . MASTECTOMY W/ SENTINEL NODE BIOPSY Right 10/26/2015  . MASTECTOMY W/ SENTINEL NODE BIOPSY Bilateral 10/26/2015   Procedure: RIGHT TOTAL MASTECTOMY WITH RIGHT SENTINEL LYMPH NODE BIOPSY, INJECT BLUE DYE RIGHT BREAST, LEFT BREAST PROPHYLACTIC MASTECTOMY;  Surgeon: Fanny Skates, MD;  Location: [facility];  Service: General;  Laterality: Bilateral;  . NASAL SEPTUM SURGERY  08  . PILONIDAL CYST EXCISION    . THYROIDECTOMY, PARTIAL  mid 80's   right side     OB History   None      Home Medications    Prior to Admission medications   Medication Sig Start Date End Date Taking? Authorizing Provider  ADVAIR DISKUS 100-50 MCG/DOSE AEPB INHALE 1 PUFF INTO THE LUNGS 2 (TWO) TIMES DAILY. 07/05/17  Yes [name], MD  albuterol (PROVENTIL HFA;VENTOLIN HFA) 108 (90 Base) MCG/ACT inhaler INHALE 2 PUFFS INTO THE LUNGS EVERY 4 HOURS AS NEEDED FOR WHEEZING OR SHORTNESS OF BREATH OR COUGH 07/07/17  Yes [name], MD  amitriptyline (ELAVIL) 25 MG tablet Take 1 tablet (25 mg total) by mouth at bedtime. 01/24/17  Yes [name], MD  cholecalciferol (VITAMIN D) 1000 UNITS tablet Take 1,000 Units by mouth daily.   Yes [provider]  DULoxetine  (CYMBALTA) 30 MG capsule TAKE 3 CAPSULES BY MOUTH EVERY DAY 06/22/17  Yes [name], MD  esomeprazole (NEXIUM) 20 MG capsule Take 20 mg by mouth daily at 12 noon.   Yes [provider]  hydrochlorothiazide (HYDRODIURIL) 25 MG tablet TAKE 1 TABLET BY MOUTH EVERY DAY 06/22/17  Yes [name], MD  ibuprofen (ADVIL,MOTRIN) 200 MG tablet Take 800 mg by mouth every 6 (six) hours as needed for headache or mild pain.    Yes [provider]  levothyroxine (SYNTHROID, LEVOTHROID) 50 MCG tablet Take 1 tablet (50 mcg total) by mouth daily before breakfast. 02/26/17  Yes [name], MD  metFORMIN (GLUCOPHAGE) 500 MG tablet Take 1 tablet (500 mg total) by mouth 2 (two) times daily with a meal. 05/06/17  Yes [name], MD  metoprolol tartrate (LOPRESSOR) 50 MG tablet Take 1 tablet (50 mg total) by mouth 2 (two) times daily. 11/27/16  Yes [name], MD  Multiple Vitamins-Minerals (MULTIVITAMIN WITH MINERALS) tablet Take 1 tablet by mouth daily.   Yes [provider]  pravastatin (PRAVACHOL) 40 MG tablet Take 1 tablet (40 mg total) by mouth daily. 07/07/17  Yes [name], MD  tiZANidine (ZANAFLEX) 4 MG tablet Take 1 tablet (4 mg total) by mouth every 8 (eight) hours as needed for muscle spasms. 04/26/17  Yes [name], MD  cefdinir (OMNICEF) 300 MG capsule Take 2 capsules (600 mg total) by mouth daily. Patient not taking: Reported on 08/06/2017 07/10/17   [name], MD  clarithromycin (BIAXIN) 250 MG tablet Take 1 tablet (250 mg total) by mouth 2 (two) times daily. Patient not taking: Reported on 08/06/2017 07/03/17   [name], MD  fluticasone [facility]) 50 MCG/ACT nasal spray PLACE 2 SPRAYS INTO BOTH NOSTRILS AT BEDTIME. Patient not taking: Reported on 08/06/2017 07/04/17   [name], MD  ipratropium (ATROVENT) 0.03 % nasal spray Place 2 sprays into the nose 4 (four) times daily. Patient not taking: Reported on 08/06/2017 01/24/17   [name], MD  temazepam (RESTORIL) 15 MG capsule  TAKE ONE CAPSULE BY MOUTH AT BEDTIME AS NEEDED FOR SLEEP Patient not taking: Reported on 08/06/2017 08/24/16   [name], MD    Family History Family History  Problem Relation Age of Onset  . Cancer Mother 29       liver cancer, hep c  . Heart disease Mother        chf  . Hypertension Mother   . Hyperlipidemia Mother   . Osteoporosis Mother   . Cancer Father 42       lung; smoker  . COPD Father   . Heart disease Sister        mvp  . Breast cancer Sister        dx. 32s  . Non-Hodgkin's lymphoma Sister 45       large B cell  . Hypertension Brother   . Arthritis Brother   . Kidney disease Brother   . Prostate cancer Brother        dx. early 67s  . Other Daughter        Beal's Syndrome  . Arthritis Daughter        Beal's  . Arthritis Son        Beal's connective tissue disease  . Vision loss Maternal Grandmother   . Dementia Maternal Grandmother   . Coronary artery disease Maternal Grandmother   . Heart Problems Maternal Grandmother   . Vision loss Paternal Grandfather   . Hypertension Brother   . Benign prostatic hyperplasia Brother   . Leukemia Brother        chronic lymphatic leukemia - small/B cell  . Hypertension Brother   . COPD Brother   . Prostate cancer Brother        dx. mid-60s  . Myelodysplastic syndrome Brother        dx. early 29s  . Hypertension Brother   . Hyperlipidemia Brother   . Prostate cancer Brother        dx. mid-70s  . Melanoma Brother        (x2) melanomas dx. late 60s-early 7s  . Mental illness Brother        schizophrenia  . Breast cancer Brother  dx. 16s  . Cirrhosis Brother        hep c  . Hypertension Sister   . Other Sister        thalessemia, anemia; hx of hysterectomy in her 59s  . Hyperlipidemia Sister   . Gout Sister   . Diverticulitis Sister   . Other Daughter        overweight  . Breast cancer Maternal Aunt 35  . Lung cancer Cousin        maternal 1st cousin dx. 29 or younger; former smoker  . Colon cancer  Cousin        maternal 1st cousin dx. late 53s-early 60s  . Cancer Cousin        maternal 1st cousin d. NOS cancer  . Emphysema Paternal Aunt   . Lung cancer Paternal Aunt        d. early 51s; smoker  . Leukemia Cousin        paternal 1st cousin d. early 82s  . Colon cancer Other 65       niece  . Melanoma Other        niece    Social History Social History   Tobacco Use  . Smoking status: Former Smoker    Packs/day: 0.50    Years: 42.00    Pack years: 21.00    Types: Cigarettes    Last attempt to quit: 10/30/2015    Years since quitting: 1.7  . Smokeless tobacco: Never Used  Substance Use Topics  . Alcohol use: No    Alcohol/week: 0.0 oz  . Drug use: No     Allergies   Chantix [varenicline]; Ciprofloxacin; Penicillins; Tamoxifen; Levaquin [levofloxacin]; Lyrica [pregabalin]; Neurontin [gabapentin]; Morphine and related; Ceclor [cefaclor]; Other; and Sulfa antibiotics   Review of Systems Review of Systems  All other systems reviewed and are negative.    Physical Exam Updated Vital Signs BP 127/64   Pulse 63   Temp 98.1 F (36.7 C) (Oral)   Resp (!) 21   SpO2 95%   Physical Exam  Constitutional: She is oriented to person, place, and time. She appears well-developed and well-nourished. No distress.  HENT:  Head: Normocephalic and atraumatic.  Neck: Neck supple.  Cardiovascular: Normal rate and regular rhythm.  No murmur heard. Pulmonary/Chest: Effort normal and breath sounds normal. No respiratory distress.  Abdominal: Soft. There is no tenderness. There is no rebound and no guarding.  Musculoskeletal: She exhibits no tenderness.  None pitting edema to bilateral lower extremities. 2+ radial pulses bilaterally.  Neurological: She is alert and oriented to person, place, and time.  Five out of five strength in all four extremities with sensation to light touch intact in all four extremities  Skin: Skin is warm and dry.  Psychiatric: She has a normal mood  and affect. Her behavior is normal.  Nursing note and vitals reviewed.    ED Treatments / Results  Labs (all labs ordered are listed, but only abnormal results are displayed) Labs Reviewed  CBC WITH DIFFERENTIAL/PLATELET - Abnormal; Notable for the following components:      Result Value   Lymphs Abs 4.5 (*)    All other components within normal limits  BASIC METABOLIC PANEL  I-STAT TROPONIN, ED    EKG EKG Interpretation  Date/Time:  Tuesday August 06 2017 21:56:41 EDT Ventricular Rate:  61 PR Interval:    QRS Duration: 93 QT Interval:  412 QTC Calculation: 415 R Axis:   59 Text Interpretation:  Sinus rhythm  Confirmed by Quintella Reichert [phone]) on 08/06/2017 10:03:23 PM   Radiology Dg Chest 2 View  Result Date: 08/06/2017 CLINICAL DATA:  Left arm pain for 2 months EXAM: CHEST - 2 VIEW COMPARISON:  07/10/2017 FINDINGS: The heart size and mediastinal contours are within normal limits. Both lungs are clear. The visualized skeletal structures are unremarkable. IMPRESSION: No active cardiopulmonary disease. Electronically Signed   By: Inez Catalina M.D.   On: 08/06/2017 21:11    Procedures Procedures (including critical care time)  Medications Ordered in ED Medications - No data to display   Initial Impression / Assessment and Plan / ED Course  I have reviewed the triage vital signs and the nursing notes.  Pertinent labs & imaging results that were available during my care of the patient were reviewed by me and considered in my medical decision making (see chart for details).     Patient with history of hypertension here for evaluation of elevated blood pressure prior to ED arrival. Her blood pressure has improved after taking her home medications. She did have a headache earlier today and this is resolved as well. Current presentation is not consistent with ACS, PE, hypertensive urgency, subarachnoid hemorrhage, CVA, dissection. Counseled patient on home care for headache as  well as elevated blood pressure. In terms of her arm pain at this is consistent with a cervical radiculopathy. Discussed outpatient follow-up as well as return precautions.  Final Clinical Impressions(s) / ED Diagnoses   Final diagnoses:  Essential hypertension  Cervical radiculopathy    ED Discharge Orders    None       Quintella Reichert, MD 08/07/17 [phone]

## 2017-08-08 ENCOUNTER — Other Ambulatory Visit: Payer: Self-pay | Admitting: Family Medicine

## 2017-08-14 ENCOUNTER — Encounter: Payer: Self-pay | Admitting: Physician Assistant

## 2017-08-22 ENCOUNTER — Ambulatory Visit: Payer: 59 | Admitting: Family Medicine

## 2017-09-03 ENCOUNTER — Ambulatory Visit (INDEPENDENT_AMBULATORY_CARE_PROVIDER_SITE_OTHER): Payer: 59 | Admitting: Family Medicine

## 2017-09-03 DIAGNOSIS — E118 Type 2 diabetes mellitus with unspecified complications: Secondary | ICD-10-CM

## 2017-09-03 DIAGNOSIS — E1169 Type 2 diabetes mellitus with other specified complication: Secondary | ICD-10-CM

## 2017-09-03 DIAGNOSIS — E785 Hyperlipidemia, unspecified: Secondary | ICD-10-CM

## 2017-09-03 DIAGNOSIS — E039 Hypothyroidism, unspecified: Secondary | ICD-10-CM

## 2017-09-03 NOTE — Progress Notes (Signed)
Pt here to for lab only visit. Did not see provider

## 2017-09-04 LAB — COMPREHENSIVE METABOLIC PANEL
ALT: 42 IU/L — ABNORMAL HIGH (ref 0–32)
AST: 26 IU/L (ref 0–40)
Albumin/Globulin Ratio: 1.9 (ref 1.2–2.2)
Albumin: 3.9 g/dL (ref 3.6–4.8)
Alkaline Phosphatase: 71 IU/L (ref 39–117)
BILIRUBIN TOTAL: 0.2 mg/dL (ref 0.0–1.2)
BUN/Creatinine Ratio: 12 (ref 12–28)
BUN: 9 mg/dL (ref 8–27)
CALCIUM: 8.9 mg/dL (ref 8.7–10.3)
CHLORIDE: 102 mmol/L (ref 96–106)
CO2: 24 mmol/L (ref 20–29)
Creatinine, Ser: 0.75 mg/dL (ref 0.57–1.00)
GFR calc non Af Amer: 83 mL/min/{1.73_m2} (ref >59)
GFR, EST AFRICAN AMERICAN: 96 mL/min/{1.73_m2} (ref >59)
Globulin, Total: 2.1 g/dL (ref 1.5–4.5)
Glucose: 117 mg/dL — ABNORMAL HIGH (ref 65–99)
Potassium: 4.3 mmol/L (ref 3.5–5.2)
Sodium: 141 mmol/L (ref 134–144)
TOTAL PROTEIN: 6 g/dL (ref 6.0–8.5)

## 2017-09-04 LAB — LIPID PANEL
CHOL/HDL RATIO: 3.7 ratio (ref 0.0–4.4)
CHOLESTEROL TOTAL: 115 mg/dL (ref 100–199)
HDL: 31 mg/dL — AB (ref >39)
LDL Calculated: 45 mg/dL (ref 0–99)
Triglycerides: 196 mg/dL — ABNORMAL HIGH (ref 0–149)
VLDL Cholesterol Cal: 39 mg/dL (ref 5–40)

## 2017-09-04 LAB — HEMOGLOBIN A1C
ESTIMATED AVERAGE GLUCOSE: 143 mg/dL
Hgb A1c MFr Bld: 6.6 % — ABNORMAL HIGH (ref 4.8–5.6)

## 2017-09-04 LAB — TSH: TSH: 2.2 u[IU]/mL (ref 0.450–4.500)

## 2017-09-05 ENCOUNTER — Other Ambulatory Visit: Payer: Self-pay

## 2017-09-05 ENCOUNTER — Ambulatory Visit: Payer: 59 | Admitting: Family Medicine

## 2017-09-05 ENCOUNTER — Encounter: Payer: Self-pay | Admitting: Family Medicine

## 2017-09-05 VITALS — BP 114/72 | HR 88 | Temp 98.0°F | Resp 16 | Ht 63.98 in | Wt 231.0 lb

## 2017-09-05 DIAGNOSIS — Z6839 Body mass index (BMI) 39.0-39.9, adult: Secondary | ICD-10-CM

## 2017-09-05 DIAGNOSIS — D171 Benign lipomatous neoplasm of skin and subcutaneous tissue of trunk: Secondary | ICD-10-CM | POA: Diagnosis not present

## 2017-09-05 DIAGNOSIS — D1722 Benign lipomatous neoplasm of skin and subcutaneous tissue of left arm: Secondary | ICD-10-CM | POA: Diagnosis not present

## 2017-09-05 DIAGNOSIS — Z720 Tobacco use: Secondary | ICD-10-CM | POA: Diagnosis not present

## 2017-09-05 DIAGNOSIS — I10 Essential (primary) hypertension: Secondary | ICD-10-CM

## 2017-09-05 DIAGNOSIS — E039 Hypothyroidism, unspecified: Secondary | ICD-10-CM | POA: Diagnosis not present

## 2017-09-05 DIAGNOSIS — E7800 Pure hypercholesterolemia, unspecified: Secondary | ICD-10-CM

## 2017-09-05 DIAGNOSIS — E1142 Type 2 diabetes mellitus with diabetic polyneuropathy: Secondary | ICD-10-CM

## 2017-09-05 MED ORDER — METFORMIN HCL 1000 MG PO TABS: 1000.0000 mg | ORAL_TABLET | Freq: Two times a day (BID) | ORAL | 3 refills | Status: DC

## 2017-09-05 NOTE — Progress Notes (Addendum)
Subjective:  By signing my name below, I, Susan [name], attest that this documentation has been prepared under the direction and in the presence of [name], MD. Electronically Signed: Moises [name], [place]. 09/05/2017 , 4:15 PM .  Patient was seen in Room 2 .   Patient ID: Susan [name], female    DOB: [birth year], 67 y.o.   MRN: [id number] Chief Complaint  Patient presents with  . Folliculitis of perineum    1 month follow up - improved  . Lipoma     on arm, thighs, and back.   . Diabetes    f/u on labs drawn 3d ago   HPI Susan [name] is a 67 y.o. female who presents to Primary Care at [facility] for follow up. Patient came in 3 days ago for [name] work for her routine labs as well.   Lipoma She had left lower extremity ultrasound done 8 months ago, left anterior upper leg.  Patient reports her pain has gotten worse in her lipomas, with most pain in her back. But, she wonders if it's more so musculoskeletal due to poor muscle tone.   DM Lab Results  Component Value Date   HGBA1C 6.6 (H) 09/03/2017   HGBA1C 6.2 (H) 04/20/2017   She hasn't been eating well. She's doing well on the metformin. She denies any complications or GI symptoms.   Smoking She's really close with her sister, who is 4 years apart, and her sister is planning to move from [place] to the beach. So, patient has started up smoking cigarettes again, about less than half a pack per day, and up to 4 packs per week. She's been coughing stuff up, but denies feeling sick. She hasn't been using the flonase nasal spray or using Advair. She was using Zyrtec, but has been off of it for about a week now.   Health maintenance Optho: scheduled for optho exam later this month.  Colonoscopy: not scheduled yet.  Mammogram: hasn't had this updated since mastectomy done by Dr. Dalbert Batman.  Hearing: plans to have hearing test done at the [facility].   She also notes finding a new church that both she and her husband  enjoys, in [place].   Past Medical History:  Diagnosis Date  . Anemia 01/28/2012  . Anxiety   . Cancer [facility]) 2004   ductal carcinoma; lumpectomy  . Cellulitis of chest wall 12/09/2015  . Complication of anesthesia    difficulty voiding after anesthesia  . COPD (chronic obstructive pulmonary disease) (HCC)    mild  . DDD (degenerative disc disease)   . Depression   . Diabetes mellitus without complication ([place])   . DJD (degenerative joint disease)   . Dysrhythmia    hx palpitations?panic attack  . Fatty liver disease, nonalcoholic   . Fibromyalgia   . GERD (gastroesophageal reflux disease)   . Hip pain, right 10/12/2013  . History of [name] clots 2004   during cancer treatment  . HTN (hypertension) 01/28/2012  . Hyperglycemia 01/28/2012  . Hyperlipidemia 01/28/2012  . Hypertension   . Hypothyroidism 02/14/2014  . Muscle spasm 12/30/2011  . Nasal polyp 08/30/2012  . Obesity   . Osteoarthritis   . Osteopenia   . Pain of left heel 01/28/2012  . Peripheral neuropathy   . Peripheral vascular disease ([place]) 04   axillary,upper chest after 3 weeks on tamoxifen  . Pneumonia    hx  . Preventative health care 01/28/2012  . Shortness of breath dyspnea  occ on exersion  . Sleep apnea    on CPAP  . Tobacco abuse-unspec 10/12/2013  . Tuberculosis    latent  finishing 9 months tx on last month   Past Surgical History:  Procedure Laterality Date  . ABDOMINAL HYSTERECTOMY  1991  . APPENDECTOMY  91  . BILATERAL OOPHORECTOMY  01/2003  . BREAST SURGERY Left 2004   lumpectomy  . CARPAL TUNNEL RELEASE     right  . INCISION AND DRAINAGE ABSCESS Left 12/13/2015   Procedure: DRAINAGE LEFT MASTECTOMY WOUND INFECTION;  Surgeon: Fanny Skates, MD;  Location: [place];  Service: General;  Laterality: Left;  . JOINT REPLACEMENT     right total knee  . MASTECTOMY W/ SENTINEL NODE BIOPSY Right 10/26/2015  . MASTECTOMY W/ SENTINEL NODE BIOPSY Bilateral 10/26/2015   Procedure: RIGHT TOTAL MASTECTOMY  WITH RIGHT SENTINEL LYMPH NODE BIOPSY, INJECT BLUE DYE RIGHT BREAST, LEFT BREAST PROPHYLACTIC MASTECTOMY;  Surgeon: Fanny Skates, MD;  Location: [facility];  Service: General;  Laterality: Bilateral;  . NASAL SEPTUM SURGERY  08  . PILONIDAL CYST EXCISION    . THYROIDECTOMY, PARTIAL  mid 80's   right side   Prior to Admission medications   Medication Sig Start Date End Date Taking? Authorizing Provider  ADVAIR DISKUS 100-50 MCG/DOSE AEPB TAKE 1 PUFF BY MOUTH TWICE A DAY 08/08/17  Yes [name], MD  albuterol (PROVENTIL HFA;VENTOLIN HFA) 108 (90 Base) MCG/ACT inhaler INHALE 2 PUFFS INTO THE LUNGS EVERY 4 HOURS AS NEEDED FOR WHEEZING OR SHORTNESS OF BREATH OR COUGH 07/07/17  Yes [name], MD  amitriptyline (ELAVIL) 25 MG tablet Take 1 tablet (25 mg total) by mouth at bedtime. 01/24/17  Yes [name], MD  cefdinir (OMNICEF) 300 MG capsule Take 2 capsules (600 mg total) by mouth daily. 07/10/17  Yes [name], MD  cholecalciferol (VITAMIN D) 1000 UNITS tablet Take 1,000 Units by mouth daily.   Yes [provider]  DULoxetine (CYMBALTA) 30 MG capsule TAKE 3 CAPSULES BY MOUTH EVERY DAY 06/22/17  Yes [name], MD  esomeprazole (NEXIUM) 20 MG capsule Take 20 mg by mouth daily at 12 noon.   Yes [provider]  fluticasone (FLONASE) 50 MCG/ACT nasal spray PLACE 2 SPRAYS INTO BOTH NOSTRILS AT BEDTIME. 07/04/17  Yes [name], MD  hydrochlorothiazide (HYDRODIURIL) 25 MG tablet TAKE 1 TABLET BY MOUTH EVERY DAY 06/22/17  Yes [name], MD  ibuprofen (ADVIL,MOTRIN) 200 MG tablet Take 800 mg by mouth every 6 (six) hours as needed for headache or mild pain.    Yes [provider]  ipratropium (ATROVENT) 0.03 % nasal spray Place 2 sprays into the nose 4 (four) times daily. 01/24/17  Yes [name], MD  levothyroxine (SYNTHROID, LEVOTHROID) 50 MCG tablet Take 1 tablet (50 mcg total) by mouth daily before breakfast. 02/26/17  Yes [name], MD  metFORMIN (GLUCOPHAGE) 500 MG tablet  Take 1 tablet (500 mg total) by mouth 2 (two) times daily with a meal. 05/06/17  Yes [name], MD  metoprolol tartrate (LOPRESSOR) 50 MG tablet Take 1 tablet (50 mg total) by mouth 2 (two) times daily. 11/27/16  Yes [name], MD  Multiple Vitamins-Minerals (MULTIVITAMIN WITH MINERALS) tablet Take 1 tablet by mouth daily.   Yes [provider]  pravastatin (PRAVACHOL) 40 MG tablet Take 1 tablet (40 mg total) by mouth daily. 07/07/17  Yes [name], MD  tiZANidine (ZANAFLEX) 4 MG tablet  Take 1 tablet (4 mg total) by mouth every 8 (eight) hours as needed for muscle spasms. 04/26/17  Yes [name], MD   Allergies  Allergen Reactions  . Chantix [Varenicline] Other (See Comments)    "Felt like having mental breakdown"  . Ciprofloxacin Other (See Comments)    Muscle tightness, tendon aching and pain  . Penicillins Anaphylaxis and Swelling  . Tamoxifen Other (See Comments)    [name] clots  . Levaquin [Levofloxacin] Other (See Comments)    Severe tendon pain  . Lyrica [Pregabalin] Swelling  . Neurontin [Gabapentin] Swelling  . Morphine And Related Other (See Comments)    MORPHINE DRIP - causes pt to feel irritable and itch  . Ceclor [Cefaclor] Rash  . Other Other (See Comments)    Redness from tape and bandaides  . Sulfa Antibiotics Rash   Family History  Problem Relation Age of Onset  . Cancer Mother 15       liver cancer, hep c  . Heart disease Mother        chf  . Hypertension Mother   . Hyperlipidemia Mother   . Osteoporosis Mother   . Cancer Father 36       lung; smoker  . COPD Father   . Heart disease Sister        mvp  . Breast cancer Sister        dx. 61s  . Non-Hodgkin's lymphoma Sister 29       large B cell  . Hypertension Brother   . Arthritis Brother   . Kidney disease Brother   . Prostate cancer Brother        dx. early 58s  . Other Daughter        Beal's Syndrome  . Arthritis Daughter        Beal's  . Arthritis Son        Beal's connective  tissue disease  . Vision loss Maternal Grandmother   . Dementia Maternal Grandmother   . Coronary artery disease Maternal Grandmother   . Heart Problems Maternal Grandmother   . Vision loss Paternal Grandfather   . Hypertension Brother   . Benign prostatic hyperplasia Brother   . Leukemia Brother        chronic lymphatic leukemia - small/B cell  . Hypertension Brother   . COPD Brother   . Prostate cancer Brother        dx. mid-60s  . Myelodysplastic syndrome Brother        dx. early 79s  . Hypertension Brother   . Hyperlipidemia Brother   . Prostate cancer Brother        dx. mid-70s  . Melanoma Brother        (x2) melanomas dx. late 60s-early 78s  . Mental illness Brother        schizophrenia  . Breast cancer Brother        dx. 78s  . Cirrhosis Brother        hep c  . Hypertension Sister   . Other Sister        thalessemia, anemia; hx of hysterectomy in her 51s  . Hyperlipidemia Sister   . Gout Sister   . Diverticulitis Sister   . Other Daughter        overweight  . Breast cancer Maternal Aunt 43  . Lung cancer Cousin        maternal 1st cousin dx. 62 or younger; former smoker  . Colon cancer Cousin  maternal 1st cousin dx. late 59s-early 60s  . Cancer Cousin        maternal 1st cousin d. NOS cancer  . Emphysema Paternal Aunt   . Lung cancer Paternal Aunt        d. early 70s; smoker  . Leukemia Cousin        paternal 1st cousin d. early 57s  . Colon cancer Other 65       niece  . Melanoma Other        niece   Social History   Socioeconomic History  . Marital status: Married    Spouse name: Not on file  . Number of children: Not on file  . Years of education: Not on file  . Highest education level: Not on file  Occupational History  . Not on file  Social Needs  . Financial resource strain: Not on file  . Food insecurity:    Worry: Not on file    Inability: Not on file  . Transportation needs:    Medical: Not on file    Non-medical: Not on  file  Tobacco Use  . Smoking status: Current Every Day Smoker    Packs/day: 0.50    Years: 42.00    Pack years: 21.00    Types: Cigarettes    Last attempt to quit: 10/30/2015    Years since quitting: 1.8  . Smokeless tobacco: Never Used  Substance and Sexual Activity  . Alcohol use: No    Alcohol/week: 0.0 oz  . Drug use: No  . Sexual activity: Never  Lifestyle  . Physical activity:    Days per week: Not on file    Minutes per session: Not on file  . Stress: Not on file  Relationships  . Social connections:    Talks on phone: Not on file    Gets together: Not on file    Attends religious service: Not on file    Active member of club or organization: Not on file    Attends meetings of clubs or organizations: Not on file    Relationship status: Not on file  Other Topics Concern  . Not on file  Social History Narrative  . Not on file   Depression screen [facility] 2/9 09/05/2017 07/25/2017 07/10/2017 04/20/2017 01/24/2017  Decreased Interest 0 0 0 0 0  Down, Depressed, Hopeless 0 0 0 0 0  PHQ - 2 Score 0 0 0 0 0  Some recent data might be hidden    Review of Systems  Constitutional: Negative for chills, fatigue, fever and unexpected weight change.  Respiratory: Negative for cough.   Gastrointestinal: Negative for constipation, diarrhea, nausea and vomiting.  Skin: Negative for rash and wound.  Neurological: Negative for dizziness, weakness and headaches.       Objective:   Physical Exam  Constitutional: She is oriented to person, place, and time. She appears well-developed and well-nourished. No distress.  HENT:  Head: Normocephalic and atraumatic.  Right Ear: External ear normal.  Left Ear: External ear normal.  Nose: Nose normal.  Mouth/Throat: Oropharynx is clear and moist.  Eyes: Pupils are equal, round, and reactive to light. EOM are normal.  Neck: Neck supple.  Cardiovascular: Normal rate.  Pulmonary/Chest: Effort normal and breath sounds normal. No respiratory  distress.  Musculoskeletal: Normal range of motion.  Neurological: She is alert and oriented to person, place, and time.  Skin: Skin is warm and dry.  Psychiatric: She has a normal mood and affect. Her behavior  is normal.  Nursing note and vitals reviewed.   BP 114/72   Pulse 88   Temp 98 F (36.7 C)   Resp 16   Ht 5' 3.98" (1.625 m)   Wt 231 lb (104.8 kg)   SpO2 99%   BMI 39.68 kg/m    Results for orders placed or performed in visit on 09/03/17  Hemoglobin A1c  Result Value Ref Range   Hgb A1c MFr Bld 6.6 (H) 4.8 - 5.6 %   Est. average glucose Bld gHb Est-mCnc 143 mg/dL  Comprehensive metabolic panel  Result Value Ref Range   Glucose 117 (H) 65 - 99 mg/dL   BUN 9 8 - 27 mg/dL   Creatinine, Ser 0.75 0.57 - 1.00 mg/dL   GFR calc non Af Amer 83 >59 mL/min/1.73   GFR calc Af Amer 96 >59 mL/min/1.73   BUN/Creatinine Ratio 12 12 - 28   Sodium 141 134 - 144 mmol/L   Potassium 4.3 3.5 - 5.2 mmol/L   Chloride 102 96 - 106 mmol/L   CO2 24 20 - 29 mmol/L   Calcium 8.9 8.7 - 10.3 mg/dL   Total Protein 6.0 6.0 - 8.5 g/dL   Albumin 3.9 3.6 - 4.8 g/dL   Globulin, Total 2.1 1.5 - 4.5 g/dL   Albumin/Globulin Ratio 1.9 1.2 - 2.2   Bilirubin Total 0.2 0.0 - 1.2 mg/dL   Alkaline Phosphatase 71 39 - 117 IU/L   AST 26 0 - 40 IU/L   ALT 42 (H) 0 - 32 IU/L  TSH  Result Value Ref Range   TSH 2.200 0.450 - 4.500 uIU/mL  Lipid panel  Result Value Ref Range   Cholesterol, Total 115 100 - 199 mg/dL   Triglycerides 196 (H) 0 - 149 mg/dL   HDL 31 (L) >39 mg/dL   VLDL Cholesterol Cal 39 5 - 40 mg/dL   LDL Calculated 45 0 - 99 mg/dL   Chol/HDL Ratio 3.7 0.0 - 4.4 ratio       Assessment & Plan:   1. Lipoma of torso   2. Lipoma of left upper extremity - long standing but worsening and becoming more painful - pt aware of problems with removal inc regrowth, scarring, etc but would like to discuss options with surgeon since they are becoming more painful and really bothering her - refer to  plastics for further eval.  3. Tobacco abuse   4. Class 2 severe obesity due to excess calories with serious comorbidity and body mass index (BMI) of 39.0 to 39.9 in adult ([place])   5. Controlled type 2 diabetes mellitus with diabetic polyneuropathy, without long-term current use of insulin (HCC) - increase metformin from 500 bid to 1g bid Lab Results  Component Value Date   HGBA1C 6.6 (H) 09/03/2017   HGBA1C 6.2 (H) 04/20/2017   HGBA1C 6.0 11/26/2016     6. Essential hypertension   7. Pure hypercholesterolemia   8. Acquired hypothyroidism     Orders Placed This Encounter  Procedures  . Ambulatory referral to Plastic Surgery    Referral Priority:   Routine    Referral Type:   Surgical    Referral Reason:   Specialty Services Required    Referred to Provider:   Wallace Going, DO    Requested Specialty:   Plastic Surgery    Number of Visits Requested:   1    Meds ordered this encounter  Medications  . metFORMIN (GLUCOPHAGE) 1000 MG tablet    Sig:  Take 1 tablet (1,000 mg total) by mouth 2 (two) times daily with a meal.    Dispense:  180 tablet    Refill:  3    I personally performed the services described in this documentation, which was scribed in my presence. The recorded information has been reviewed and considered, and addended by me as needed.   [name], M.D.  Primary Care at [facility] [address] [place], [place] [address] [phone] phone [phone] fax  10/01/17 3:02 PM

## 2017-09-05 NOTE — Patient Instructions (Addendum)
Consider wellbutrin for smoking, weight loss, depression (would have to decrease cymbalta a little). Consider victoza (daily injection) for diabetes and weight loss  Restart cetirizine Restart flonase Use the advair twice every day and rinse your mouth after.   IF you received an x-ray today, you will receive an invoice from [facility]. Please contact [facility] at [phone] with questions or concerns regarding your invoice.   IF you received labwork today, you will receive an invoice from [place]. Please contact [facility] at [phone] with questions or concerns regarding your invoice.   Our billing staff will not be able to assist you with questions regarding bills from these companies.  You will be contacted with the lab results as soon as they are available. The fastest way to get your results is to activate your My Chart account. Instructions are located on the last page of this paperwork. If you have not heard from [place] regarding the results in 2 weeks, please contact this office.     Lipoma A lipoma is a noncancerous (benign) tumor that is made up of fat cells. This is a very common type of soft-tissue growth. Lipomas are usually found under the skin (subcutaneous). They may occur in any tissue of the body that contains fat. Common areas for lipomas to appear include the back, shoulders, buttocks, and thighs. Lipomas grow slowly, and they are usually painless. Most lipomas do not cause problems and do not require treatment. What are the causes? The cause of this condition is not known. What increases the risk? This condition is more likely to develop in:  People who are 27-44 years old.  People who have a family history of lipomas.  What are the signs or symptoms? A lipoma usually appears as a small, round bump under the skin. It may feel soft or rubbery, but the firmness can vary. Most lipomas are not painful. However, a lipoma may become painful if it is  located in an area where it pushes on nerves. How is this diagnosed? A lipoma can usually be diagnosed with a physical exam. You may also have tests to confirm the diagnosis and to rule out other conditions. Tests may include:  Imaging tests, such as a CT scan or MRI.  Removal of a tissue sample to be looked at under a microscope (biopsy).  How is this treated? Treatment is not needed for small lipomas that are not causing problems. If a lipoma continues to get bigger or it causes problems, removal is often the best option. Lipomas can also be removed to improve appearance. Removal of a lipoma is usually done with a surgery in which the fatty cells and the surrounding capsule are removed. Most often, a medicine that numbs the area (local anesthetic) is used for this procedure. Follow these instructions at home:  Keep all follow-up visits as directed by your health care provider. This is important. Contact a health care provider if:  Your lipoma becomes larger or hard.  Your lipoma becomes painful, red, or increasingly swollen. These could be signs of infection or a more serious condition. This information is not intended to replace advice given to you by your health care provider. Make sure you discuss any questions you have with your health care provider. Document Released: 04/13/2002 Document Revised: 09/29/2015 Document Reviewed: 04/19/2014 Elsevier Interactive Patient Education  2018 [place].  Lipoma Removal Lipoma removal is a surgical procedure to remove a noncancerous (benign) tumor that is made up of fat cells (lipoma). Most  lipomas are small and painless and do not require treatment. They can form in many areas of the body but are most common under the skin of the back, shoulders, arms, and thighs. You may need lipoma removal if you have a lipoma that is large, growing, or causing discomfort. Lipoma removal may also be done for cosmetic reasons. Tell a health care provider  about:  Any allergies you have.  All medicines you are taking, including vitamins, herbs, eye drops, creams, and over-the-counter medicines.  Any problems you or family members have had with anesthetic medicines.  Any blood disorders you have.  Any surgeries you have had.  Any medical conditions you have.  Whether you are pregnant or may be pregnant. What are the risks? Generally, this is a safe procedure. However, problems may occur, including:  Infection.  Bleeding.  Allergic reactions to medicines.  Damage to nerves or blood vessels near the lipoma.  Scarring.  What happens before the procedure? Staying hydrated Follow instructions from your health care provider about hydration, which may include:  Up to 2 hours before the procedure - you may continue to drink clear liquids, such as water, clear fruit juice, black coffee, and plain tea.  Eating and drinking restrictions Follow instructions from your health care provider about eating and drinking, which may include:  8 hours before the procedure - stop eating heavy meals or foods such as meat, fried foods, or fatty foods.  6 hours before the procedure - stop eating light meals or foods, such as toast or cereal.  6 hours before the procedure - stop drinking milk or drinks that contain milk.  2 hours before the procedure - stop drinking clear liquids.  Medicines  Ask your health care provider about: ? Changing or stopping your regular medicines. This is especially important if you are taking diabetes medicines or blood thinners. ? Taking medicines such as aspirin and ibuprofen. These medicines can thin your blood. Do not take these medicines before your procedure if your health care provider instructs you not to.  You may be given antibiotic medicine to help prevent infection. General instructions  Ask your health care provider how your surgical site will be marked or identified.  You will have a physical exam.  Your health care provider will check the size of the lipoma and whether it can be moved easily.  You may have imaging tests, such as: ? X-rays. ? CT scan. ? MRI.  Plan to have someone take you home from the hospital or clinic. What happens during the procedure?  To reduce your risk of infection: ? Your health care team will wash or sanitize their hands. ? Your skin will be washed with soap.  You will be given one or more of the following: ? A medicine to help you relax (sedative). ? A medicine to numb the area (local anesthetic). ? A medicine to make you fall asleep (general anesthetic). ? A medicine that is injected into an area of your body to numb everything below the injection site (regional anesthetic).  An incision will be made over the lipoma or very near the lipoma. The incision may be made in a natural skin line or crease.  Tissues, nerves, and blood vessels near the lipoma will be moved out of the way.  The lipoma and the capsule that surrounds it will be separated from the surrounding tissues.  The lipoma will be removed.  The incision may be closed with stitches (sutures).  A bandage (  dressing) will be placed over the incision. What happens after the procedure?  Do not drive for 24 hours if you received a sedative.  Your blood pressure, heart rate, breathing rate, and blood oxygen level will be monitored until the medicines you were given have worn off. This information is not intended to replace advice given to you by your health care provider. Make sure you discuss any questions you have with your health care provider. Document Released: 07/07/2015 Document Revised: 09/29/2015 Document Reviewed: 07/07/2015 Elsevier Interactive Patient Education  2018 [place].  Lipoma Removal, Care After Refer to this sheet in the next few weeks. These instructions provide you with information about caring for yourself after your procedure. Your health care provider may  also give you more specific instructions. Your treatment has been planned according to current medical practices, but problems sometimes occur. Call your health care provider if you have any problems or questions after your procedure. What can I expect after the procedure? After the procedure, it is common to have:  Mild pain.  Swelling.  Bruising.  Follow these instructions at home:  Bathing  Do not take baths, swim, or use a hot tub until your health care provider approves. Ask your health care provider if you can take showers. You may only be allowed to take sponge baths for bathing.  Keep your bandage (dressing) dry until your health care provider says it can be removed. Incision care   Follow instructions from your health care provider about how to take care of your incision. Make sure you: ? Wash your hands with soap and water before you change your bandage (dressing). If soap and water are not available, use hand sanitizer. ? Change your dressing as told by your health care provider. ? Leave stitches (sutures), skin glue, or adhesive strips in place. These skin closures may need to stay in place for 2 weeks or longer. If adhesive strip edges start to loosen and curl up, you may trim the loose edges. Do not remove adhesive strips completely unless your health care provider tells you to do that.  Check your incision area every day for signs of infection. Check for: ? More redness, swelling, or pain. ? Fluid or blood. ? Warmth. ? Pus or a bad smell. Driving  Do not drive or operate heavy machinery while taking prescription pain medicine.  Do not drive for 24 hours if you received a medicine to help you relax (sedative) during your procedure.  Ask your health care provider when it is safe for you to drive. General instructions  Take over-the-counter and prescription medicines only as told by your health care provider.  Do not use any tobacco products, such as cigarettes,  chewing tobacco, and e-cigarettes. These can delay healing. If you need help quitting, ask your health care provider.  Return to your normal activities as told by your health care provider. Ask your health care provider what activities are safe for you.  Keep all follow-up visits as told by your health care provider. This is important. Contact a health care provider if:  You have more redness, swelling, or pain around your incision.  You have fluid or blood coming from your incision.  Your incision feels warm to the touch.  You have pus or a bad smell coming from your incision.  You have pain that does not get better with medicine. Get help right away if:  You have chills or a fever.  You have severe pain. This information  is not intended to replace advice given to you by your health care provider. Make sure you discuss any questions you have with your health care provider. Document Released: 07/07/2015 Document Revised: 10/04/2015 Document Reviewed: 07/07/2015 Elsevier Interactive Patient Education  2018 [employer].

## 2017-09-07 ENCOUNTER — Other Ambulatory Visit: Payer: Self-pay | Admitting: Family Medicine

## 2017-09-07 DIAGNOSIS — M797 Fibromyalgia: Secondary | ICD-10-CM

## 2017-09-07 DIAGNOSIS — I10 Essential (primary) hypertension: Secondary | ICD-10-CM

## 2017-09-09 NOTE — Telephone Encounter (Signed)
Amitriptyline refill Last OV:09/05/17 Last refill:01/24/17 90 tab/3 refills YKZ:LDJT Pharmacy: [id number] - [place], [place] (Phone) [phone] (Fax)

## 2017-09-12 ENCOUNTER — Encounter: Payer: Self-pay | Admitting: Family Medicine

## 2017-09-19 ENCOUNTER — Encounter: Payer: Self-pay | Admitting: Family Medicine

## 2017-09-27 ENCOUNTER — Other Ambulatory Visit: Payer: Self-pay | Admitting: Family Medicine

## 2017-10-01 ENCOUNTER — Telehealth: Payer: Self-pay | Admitting: Family Medicine

## 2017-10-01 NOTE — Telephone Encounter (Addendum)
THIS IS AN ERROR - does not need to have appt with ob-gy!!!   I was sending her to plastics for multiple painful lipomas on her extremities and torso - on her left upper arm, thigh - and I think one was on her torso - like upper right flank/scapular region. . .   Absolutely nothing an ob-gyn (or derm) could could possibly help her with. . . .  Please call their office back to have pt rescheduled as they did not get the correct reason for referral. . .   It looks like the "chief complaint" on her last visit where referral was placed was listed folliculitis of the perineum but that had resolved and was actually a miscommunication from when pt told nurse "here to follow-up" and that was what her last visit was for - there is no part of folliculitis or the perineum involved in the referral - lipomas becoming progressively painful on extremities.  I did change the chief complaint to reflect this so if you want to refax her last night hopefully that will help. THANKS!

## 2017-10-01 NOTE — Telephone Encounter (Signed)
Copied from [place] [id number]. Topic: Referral - Question >> Oct 01, 2017  1:39 PM Vernona Rieger wrote: Reason for CRM: FYI::  Sharyn Lull from [facility] ( Dr Marla Roe ) called and said that she needs to see an obgyn first. They are canceling her appointment with them.

## 2017-10-02 ENCOUNTER — Telehealth: Payer: Self-pay

## 2017-10-02 ENCOUNTER — Encounter (INDEPENDENT_AMBULATORY_CARE_PROVIDER_SITE_OTHER): Payer: Self-pay

## 2017-10-02 NOTE — Telephone Encounter (Signed)
Spoke with office and let them know note had been addended. Re-faxed on 5/29 and they will have Dr. Marla Roe review this. Sent pt MyChart message updating her on status.

## 2017-10-02 NOTE — Telephone Encounter (Signed)
Copied from [place] [id number]. Topic: Referral - Question >> Oct 01, 2017  1:39 PM Vernona Rieger wrote: Reason for CRM: FYI::  Sharyn Lull from [facility] ( Dr Marla Roe ) called and said that she needs to see an obgyn first. They are canceling her appointment with them.    Referrals has contacted pt via mychart.  To see Dr. Marla Roe - see msg dated 10/02/2017 9:25am

## 2017-10-02 NOTE — Telephone Encounter (Signed)
thanks

## 2017-10-04 DIAGNOSIS — H01025 Squamous blepharitis left lower eyelid: Secondary | ICD-10-CM | POA: Diagnosis not present

## 2017-10-04 DIAGNOSIS — H11153 Pinguecula, bilateral: Secondary | ICD-10-CM | POA: Diagnosis not present

## 2017-10-04 DIAGNOSIS — H11123 Conjunctival concretions, bilateral: Secondary | ICD-10-CM | POA: Diagnosis not present

## 2017-10-04 DIAGNOSIS — H01021 Squamous blepharitis right upper eyelid: Secondary | ICD-10-CM | POA: Diagnosis not present

## 2017-10-04 DIAGNOSIS — H01024 Squamous blepharitis left upper eyelid: Secondary | ICD-10-CM | POA: Diagnosis not present

## 2017-10-04 DIAGNOSIS — H10413 Chronic giant papillary conjunctivitis, bilateral: Secondary | ICD-10-CM | POA: Diagnosis not present

## 2017-10-04 DIAGNOSIS — D352 Benign neoplasm of pituitary gland: Secondary | ICD-10-CM | POA: Diagnosis not present

## 2017-10-04 DIAGNOSIS — E119 Type 2 diabetes mellitus without complications: Secondary | ICD-10-CM | POA: Diagnosis not present

## 2017-10-04 DIAGNOSIS — H01022 Squamous blepharitis right lower eyelid: Secondary | ICD-10-CM | POA: Diagnosis not present

## 2017-10-04 DIAGNOSIS — H2513 Age-related nuclear cataract, bilateral: Secondary | ICD-10-CM | POA: Diagnosis not present

## 2017-10-07 ENCOUNTER — Encounter: Payer: Self-pay | Admitting: Family Medicine

## 2017-10-08 ENCOUNTER — Other Ambulatory Visit: Payer: Self-pay | Admitting: Family Medicine

## 2017-10-08 ENCOUNTER — Encounter: Payer: Self-pay | Admitting: Physician Assistant

## 2017-10-08 ENCOUNTER — Ambulatory Visit (INDEPENDENT_AMBULATORY_CARE_PROVIDER_SITE_OTHER): Payer: 59 | Admitting: Physician Assistant

## 2017-10-08 VITALS — BP 121/73 | HR 78 | Temp 98.1°F | Resp 17 | Ht 64.0 in | Wt 225.0 lb

## 2017-10-08 DIAGNOSIS — R059 Cough, unspecified: Secondary | ICD-10-CM

## 2017-10-08 DIAGNOSIS — R05 Cough: Secondary | ICD-10-CM | POA: Diagnosis not present

## 2017-10-08 DIAGNOSIS — F17200 Nicotine dependence, unspecified, uncomplicated: Secondary | ICD-10-CM

## 2017-10-08 DIAGNOSIS — J209 Acute bronchitis, unspecified: Secondary | ICD-10-CM | POA: Diagnosis not present

## 2017-10-08 DIAGNOSIS — E038 Other specified hypothyroidism: Secondary | ICD-10-CM

## 2017-10-08 MED ORDER — PREDNISONE 20 MG PO TABS: 40.0000 mg | ORAL_TABLET | Freq: Every day | ORAL | 0 refills | Status: AC

## 2017-10-08 MED ORDER — AZITHROMYCIN 250 MG PO TABS: ORAL_TABLET | ORAL | 0 refills | Status: DC

## 2017-10-08 MED ORDER — BENZONATATE 100 MG PO CAPS: 100.0000 mg | ORAL_CAPSULE | Freq: Three times a day (TID) | ORAL | 0 refills | Status: DC | PRN

## 2017-10-08 NOTE — Progress Notes (Signed)
Susan [name]  MRN: [id number] DOB: 1950/12/09  PCP: [name], MD  Subjective:  Pt is a pleasant 67 year old female who presents to clinic for cough and congestion x 3 weeks.   C/o chest tightness, mild shob, cough, fatigue, congestion, voice change, and sore throat. Cough is occasionally productive. Her condition is not improving nor worsening over the past 3 weeks.  Denies night sweats, fever, chills, n/v, chest pain, hemoptysis. She was last treated for similar symptoms about 3 months ago with prednisone, and cefdinir.   She has a h/o COPD. She has been using duoneb at home. She uses Advair bid regularly. She has not been using Albuterol.    Her husband is also sick with similar symptoms.   Still smoking about 10 cigarettes/daily. "I want to be free from the bondage of smoking".   She had her ophthalmology exam last week with Dr. Katy Fitch.   Pt  has a past medical history of Anemia (01/28/2012), Anxiety, Cancer ([place]) (2004), Cellulitis of chest wall (12/45/8099), Complication of anesthesia, COPD (chronic obstructive pulmonary disease) ([place]), DDD (degenerative disc disease), Depression, Diabetes mellitus without complication ([place]), DJD (degenerative joint disease), Dysrhythmia, Fatty liver disease, nonalcoholic, Fibromyalgia, GERD (gastroesophageal reflux disease), Hip pain, right (10/12/2013), History of blood clots (2004), HTN (hypertension) (01/28/2012), Hyperglycemia (01/28/2012), Hyperlipidemia (01/28/2012), Hypertension, Hypothyroidism (02/14/2014), Muscle spasm (12/30/2011), Nasal polyp (08/30/2012), Obesity, Osteoarthritis, Osteopenia, Pain of left heel (01/28/2012), Peripheral neuropathy, Peripheral vascular disease ([place]) (04), Pneumonia, Preventative health care (01/28/2012), Shortness of breath dyspnea, Sleep apnea, Tobacco abuse-unspec (10/12/2013), and Tuberculosis.   Review of Systems  Constitutional: Negative for chills, diaphoresis, fatigue and fever.  HENT: Positive for  congestion, rhinorrhea and voice change. Negative for postnasal drip, sinus pressure, sinus pain and sore throat.   Respiratory: Positive for cough, chest tightness and shortness of breath. Negative for wheezing.   Psychiatric/Behavioral: Negative for sleep disturbance.    Patient Active Problem List   Diagnosis Date Noted  . Induration, breast   . Left shoulder pain   . Cancer of central portion of right breast ([place]) 10/26/2015  . Genetic testing 10/17/2015  . History of breast cancer in female 09/15/2015  . Breast cancer of upper-outer quadrant of right female breast ([place]) 09/15/2015  . Family history of breast cancer in female 09/15/2015  . Family history of colon cancer 09/15/2015  . TB lung, latent 01/11/2015  . COPD (chronic obstructive pulmonary disease) ([place]) 10/07/2014  . Mediastinal lymphadenopathy 10/07/2014  . Diabetes mellitus type 2, controlled ([place]) 09/29/2014  . Pulmonary nodule 09/29/2014  . History of chest pain 09/29/2014  . Essential hypertension 09/29/2014  . Chest pain 09/23/2014  . Hypothyroidism 02/14/2014  . Hip pain, right 10/12/2013  . Cervical cancer screening 03/09/2013  . Seasonal allergies 09/12/2012  . Nasal polyp 08/30/2012  . SOM (serous otitis media) 08/30/2012  . Peripheral neuropathy 07/19/2012  . Cervical myofascial pain syndrome 06/08/2012  . Anemia 01/28/2012  . Hyperlipidemia 01/28/2012  . Hyperglycemia 01/28/2012  . Pain of left heel 01/28/2012  . Neck pain 12/30/2011  . Muscle spasm 12/30/2011  . Anxiety and depression   . Osteoarthritis   . GERD (gastroesophageal reflux disease)   . Fibromyalgia   . Sleep apnea   . Obesity   . History of blood clots   . Fatty liver disease, nonalcoholic   . DDD (degenerative disc disease)     Current Outpatient Medications on File Prior to Visit  Medication Sig Dispense Refill  . ADVAIR DISKUS  100-50 MCG/DOSE AEPB TAKE 1 PUFF BY MOUTH TWICE A DAY 60 each 0  . albuterol (PROVENTIL  HFA;VENTOLIN HFA) 108 (90 Base) MCG/ACT inhaler INHALE 2 PUFFS INTO THE LUNGS EVERY 4 HOURS AS NEEDED FOR WHEEZING OR SHORTNESS OF BREATH OR COUGH 18 Inhaler 0  . amitriptyline (ELAVIL) 25 MG tablet TAKE 1 TABLET BY MOUTH EVERYDAY AT BEDTIME 90 tablet 1  . cholecalciferol (VITAMIN D) 1000 UNITS tablet Take 1,000 Units by mouth daily.    . DULoxetine (CYMBALTA) 30 MG capsule TAKE 3 CAPSULES BY MOUTH EVERY DAY 270 capsule 0  . esomeprazole (NEXIUM) 20 MG capsule Take 20 mg by mouth daily at 12 noon.    . fluticasone (FLONASE) 50 MCG/ACT nasal spray PLACE 2 SPRAYS INTO BOTH NOSTRILS AT BEDTIME. 16 g 0  . hydrochlorothiazide (HYDRODIURIL) 25 MG tablet TAKE 1 TABLET BY MOUTH EVERY DAY 90 tablet 0  . ibuprofen (ADVIL,MOTRIN) 200 MG tablet Take 800 mg by mouth every 6 (six) hours as needed for headache or mild pain.     Marland Kitchen ipratropium (ATROVENT) 0.03 % nasal spray PLACE 2 SPRAYS INTO THE NOSE 4 (FOUR) TIMES DAILY. 30 mL 1  . levothyroxine (SYNTHROID, LEVOTHROID) 50 MCG tablet Take 1 tablet (50 mcg total) by mouth daily before breakfast. 90 tablet 1  . metFORMIN (GLUCOPHAGE) 1000 MG tablet Take 1 tablet (1,000 mg total) by mouth 2 (two) times daily with a meal. 180 tablet 3  . metoprolol tartrate (LOPRESSOR) 50 MG tablet TAKE 1 TABLET BY MOUTH TWICE A DAY 180 tablet 0  . Multiple Vitamins-Minerals (MULTIVITAMIN WITH MINERALS) tablet Take 1 tablet by mouth daily.    . pravastatin (PRAVACHOL) 40 MG tablet Take 1 tablet (40 mg total) by mouth daily. 90 tablet 0  . tiZANidine (ZANAFLEX) 4 MG tablet Take 1 tablet (4 mg total) by mouth every 8 (eight) hours as needed for muscle spasms. 90 tablet 1   No current facility-administered medications on file prior to visit.     Allergies  Allergen Reactions  . Chantix [Varenicline] Other (See Comments)    "Felt like having mental breakdown"  . Ciprofloxacin Other (See Comments)    Muscle tightness, tendon aching and pain  . Penicillins Anaphylaxis and Swelling    . Tamoxifen Other (See Comments)    Blood clots  . Levaquin [Levofloxacin] Other (See Comments)    Severe tendon pain  . Lyrica [Pregabalin] Swelling  . Neurontin [Gabapentin] Swelling  . Morphine And Related Other (See Comments)    MORPHINE DRIP - causes pt to feel irritable and itch  . Ceclor [Cefaclor] Rash  . Other Other (See Comments)    Redness from tape and bandaides  . Sulfa Antibiotics Rash     Objective:  BP 121/73   Pulse 78   Temp 98.1 F (36.7 C) (Oral)   Resp 17   Ht 5\' 4"  (1.626 m)   Wt 225 lb (102.1 kg)   SpO2 98%   BMI 38.62 kg/m   Physical Exam  Constitutional: She is oriented to person, place, and time. No distress.  HENT:  Right Ear: Tympanic membrane normal.  Left Ear: Tympanic membrane normal.  Nose: Mucosal edema present. No rhinorrhea. Right sinus exhibits no maxillary sinus tenderness and no frontal sinus tenderness. Left sinus exhibits no maxillary sinus tenderness and no frontal sinus tenderness.  Mouth/Throat: Oropharynx is clear and moist and mucous membranes are normal.  Cardiovascular: Normal rate, regular rhythm and normal heart sounds.  Pulmonary/Chest: Effort normal. No respiratory  distress. She has wheezes in the right upper field and the left upper field. She has no rhonchi. She has no rales.  Neurological: She is alert and oriented to person, place, and time.  Skin: Skin is warm and dry.  Psychiatric: Judgment normal.  Vitals reviewed.   Assessment and Plan :  1. Cough 2. Acute bronchitis, unspecified organism - azithromycin (ZITHROMAX) 250 MG tablet; Take 2 tabs PO x 1 dose, then 1 tab PO QD x 4 days  Dispense: 6 tablet; Refill: 0 - predniSONE (DELTASONE) 20 MG tablet; Take 2 tablets (40 mg total) by mouth daily with breakfast for 5 days.  Dispense: 10 tablet; Refill: 0 - benzonatate (TESSALON) 100 MG capsule; Take 1-2 capsules (100-200 mg total) by mouth 3 (three) times daily as needed for cough.  Dispense: 40 capsule; Refill:  0 - pt presents c/o cough and congestion x 3 weeks. Her vitals are stable. Plan to cover with azithromycin and prednisone. Advised pt to use home neb treatment as needed. RTC in 7-10 days if no improvement, sooner if condition worsens.  3. Current smoker - Pt discussed desire to quit smoking. Advised pt to stop while she is feeling ill, and to cut back. Follow-up with her PCP regarding quitting.   Mercer Pod, PA-C  [facility] 10/08/2017 9:12 AM

## 2017-10-08 NOTE — Patient Instructions (Addendum)
Start taking Prednisone 99m daily for the next 5 days.  Azithromycin is an antibiotic. Take this as directed for the next 5 days.  Tessalon is to help reduce cough.  Use your nebulizer treatment as needed.  Stay well hydrated! Drink at least 1-2 liters of water daily.  Do not smoke while you are sick. This will prolong your illness and delay healing time.  Try to cut back on smoking.  Come back if you are not improving in 7-10 days.   Get lost of rest. Wash your hands often.   -Foods that can help speed recovery: honey, garlic, chicken soup, elderberries, green tea.  -Supplements that can help speed recovery: vitamin C, zinc, elderberry extract, quercetin, ginseng, selenium -Supplement with prebiotics and probiotics:   For sore throat: ? Gargle with 8 oz of salt water ( tsp of salt per 1 qt of water) as often as every 1-2 hours to soothe your throat.  Gargle liquid benadryl.  Cepacol throat lozenges (if you are not at risk for choking).  For sore throat try using a honey-based tea. Use 3 teaspoons of honey with juice squeezed from half lemon. Place shaved pieces of ginger into 1/2-1 cup of water and warm over stove top. Then mix the ingredients and repeat every 4 hours as needed.  Cough Syrup Recipe: Sweet Lemon & Honey Thyme  Ingredients a handful of fresh thyme sprigs   1 pint of water (2 cups)  1/2 cup honey (raw is best, but regular will do)  1/2 lemon chopped Instructions 1. Place the lemon in the pint jar and cover with the honey. The honey will macerate the lemons and draw out liquids which taste so delicious! 2. Meanwhile, toss the thyme leaves into a saucepan and cover them with the water. 3. Bring the water to a gentle simmer and reduce it to half, about a cup of tea. 4. When the tea is reduced and cooled a bit, strain the sprigs & leaves, add it into the pint jar and stir it well. 5. Give it a shake and use a spoonful as needed. 6. Store your homemade cough syrup in  the refrigerator for about a month.  Is there anything I can do on my own to get rid of my cough? Yes. To help get rid of your cough, you can: ?Use a humidifier in your bedroom ?Use an over-the-counter cough medicine, or suck on cough drops or hard candy ?Stop smoking, if you smoke ?If you have allergies, avoid the things you are allergic to (like pollen, dust, animals, or mold) If you have acid reflux, your doctor or nurse will tell you which lifestyle changes can help reduce symptoms.     Thank you for coming in today. I hope you feel we met your needs.  Feel free to call PCP if you have any questions or further requests.  Please consider signing up for MyChart if you do not already have it, as this is a great way to communicate with me.  Best,  Whitney McVey, PA-C  IF you received an x-ray today, you will [facility]. Please contact [facility] at [phone] questions or concerns regarding your invoice.   IF you received labwork today, you will receive an invoice from [facility] Please contact [facility] at [phone] questions or concerns regarding your invoice.   Our billing staff will not be able to assist you with questions regarding bills from these companies.  You will be  contacted with the lab results as soon as they are available. The fastest way to get your results is to activate your My Chart account. Instructions are located on the last page of this paperwork. If you have not heard from [place] regarding the results in 2 weeks, please contact this office.

## 2017-10-09 NOTE — Telephone Encounter (Signed)
Stated you would review this during last visit. No notes listed.

## 2017-10-10 NOTE — Telephone Encounter (Signed)
Pharmacy calling to follow up on this request. Pt is out and needs soon please levothyroxine (SYNTHROID, LEVOTHROID) 50 MCG tablet   [id number] - [place], [place] - [address] [phone] (Phone) [phone] (Fax)

## 2017-10-28 DIAGNOSIS — K219 Gastro-esophageal reflux disease without esophagitis: Secondary | ICD-10-CM | POA: Insufficient documentation

## 2017-10-28 DIAGNOSIS — E882 Lipomatosis, not elsewhere classified: Secondary | ICD-10-CM | POA: Diagnosis not present

## 2017-10-28 DIAGNOSIS — E669 Obesity, unspecified: Secondary | ICD-10-CM | POA: Insufficient documentation

## 2017-10-28 DIAGNOSIS — IMO0002 Reserved for concepts with insufficient information to code with codable children: Secondary | ICD-10-CM | POA: Insufficient documentation

## 2017-10-28 DIAGNOSIS — Z86718 Personal history of other venous thrombosis and embolism: Secondary | ICD-10-CM | POA: Insufficient documentation

## 2017-10-28 DIAGNOSIS — M797 Fibromyalgia: Secondary | ICD-10-CM | POA: Insufficient documentation

## 2017-10-31 ENCOUNTER — Encounter: Payer: Self-pay | Admitting: Physician Assistant

## 2017-10-31 ENCOUNTER — Other Ambulatory Visit: Payer: Self-pay

## 2017-10-31 ENCOUNTER — Ambulatory Visit (INDEPENDENT_AMBULATORY_CARE_PROVIDER_SITE_OTHER): Payer: 59 | Admitting: Physician Assistant

## 2017-10-31 VITALS — BP 120/68 | HR 79 | Temp 98.5°F | Resp 18 | Ht 64.0 in | Wt 226.0 lb

## 2017-10-31 DIAGNOSIS — S60551A Superficial foreign body of right hand, initial encounter: Secondary | ICD-10-CM | POA: Diagnosis not present

## 2017-10-31 DIAGNOSIS — M25562 Pain in left knee: Secondary | ICD-10-CM | POA: Diagnosis not present

## 2017-10-31 DIAGNOSIS — W19XXXA Unspecified fall, initial encounter: Secondary | ICD-10-CM

## 2017-10-31 MED ORDER — MELOXICAM 7.5 MG PO TABS: 7.5000 mg | ORAL_TABLET | Freq: Every day | ORAL | 0 refills | Status: DC

## 2017-10-31 NOTE — Progress Notes (Signed)
Susan [name]  MRN: [id number] DOB: May 21, 1950  PCP: [name], MD  Chief Complaint  Patient presents with  . Fall    left knee pain fell yesterday also needs splinter removed from hand     Subjective:  Susan [name] is a 67 year old female with h/o right knee replacement presenting for evaluation of a fall. She has been helping her sister pack up her home. Yesterday she stumbled over items on deck and fell forward on right hand and left knee. She aquired a splinter in her hand that was mostly removed by sister. She did not hit her head but notes that she developed a bad headache from the jarring motion of the fall. It has resolved today. She notes that her leg does not feel as stable as it should. Pain radiates to quadriceps and calf, no real knee pain. Her leg feels weak but she is able to walk and drive. She has tried ice and 800mg  ibuprofen.   Pt has known arthritis in her left knee.   History is obtained by patient.  Review of Systems  Constitutional: Negative for chills and fever.  Musculoskeletal: Positive for joint swelling.    Patient Active Problem List   Diagnosis Date Noted  . Induration, breast   . Left shoulder pain   . Cancer of central portion of right breast ([place]) 10/26/2015  . Genetic testing 10/17/2015  . History of breast cancer in female 09/15/2015  . Breast cancer of upper-outer quadrant of right female breast ([place]) 09/15/2015  . Family history of breast cancer in female 09/15/2015  . Family history of colon cancer 09/15/2015  . TB lung, latent 01/11/2015  . COPD (chronic obstructive pulmonary disease) ([place]) 10/07/2014  . Mediastinal lymphadenopathy 10/07/2014  . Diabetes mellitus type 2, controlled ([place]) 09/29/2014  . Pulmonary nodule 09/29/2014  . History of chest pain 09/29/2014  . Essential hypertension 09/29/2014  . Chest pain 09/23/2014  . Hypothyroidism 02/14/2014  . Hip pain, right 10/12/2013  . Cervical cancer screening 03/09/2013    . Seasonal allergies 09/12/2012  . Nasal polyp 08/30/2012  . SOM (serous otitis media) 08/30/2012  . Peripheral neuropathy 07/19/2012  . Cervical myofascial pain syndrome 06/08/2012  . Anemia 01/28/2012  . Hyperlipidemia 01/28/2012  . Hyperglycemia 01/28/2012  . Pain of left heel 01/28/2012  . Neck pain 12/30/2011  . Muscle spasm 12/30/2011  . Anxiety and depression   . Osteoarthritis   . GERD (gastroesophageal reflux disease)   . Fibromyalgia   . Sleep apnea   . Obesity   . History of blood clots   . Fatty liver disease, nonalcoholic   . DDD (degenerative disc disease)     Current Outpatient Medications on File Prior to Visit  Medication Sig Dispense Refill  . ADVAIR DISKUS 100-50 MCG/DOSE AEPB TAKE 1 PUFF BY MOUTH TWICE A DAY 60 each 0  . albuterol (PROVENTIL HFA;VENTOLIN HFA) 108 (90 Base) MCG/ACT inhaler INHALE 2 PUFFS INTO THE LUNGS EVERY 4 HOURS AS NEEDED FOR WHEEZING OR SHORTNESS OF BREATH OR COUGH 18 Inhaler 0  . amitriptyline (ELAVIL) 25 MG tablet TAKE 1 TABLET BY MOUTH EVERYDAY AT BEDTIME 90 tablet 1  . benzonatate (TESSALON) 100 MG capsule Take 1-2 capsules (100-200 mg total) by mouth 3 (three) times daily as needed for cough. 40 capsule 0  . cholecalciferol (VITAMIN D) 1000 UNITS tablet Take 1,000 Units by mouth daily.    . DULoxetine (CYMBALTA) 30 MG capsule TAKE 3 CAPSULES BY MOUTH  EVERY DAY 270 capsule 0  . esomeprazole (NEXIUM) 20 MG capsule Take 20 mg by mouth daily at 12 noon.    . fluticasone (FLONASE) 50 MCG/ACT nasal spray PLACE 2 SPRAYS INTO BOTH NOSTRILS AT BEDTIME. 16 g 2  . hydrochlorothiazide (HYDRODIURIL) 25 MG tablet TAKE 1 TABLET BY MOUTH EVERY DAY 90 tablet 0  . ibuprofen (ADVIL,MOTRIN) 200 MG tablet Take 800 mg by mouth every 6 (six) hours as needed for headache or mild pain.     Marland Kitchen ipratropium (ATROVENT) 0.03 % nasal spray PLACE 2 SPRAYS INTO THE NOSE 4 (FOUR) TIMES DAILY. 30 mL 1  . levothyroxine (SYNTHROID, LEVOTHROID) 50 MCG tablet Take 1 tablet  (50 mcg total) by mouth daily before breakfast. 90 tablet 1  . levothyroxine (SYNTHROID, LEVOTHROID) 50 MCG tablet TAKE 1 TABLET (50 MCG TOTAL) BY MOUTH DAILY BEFORE BREAKFAST. 90 tablet 0  . metFORMIN (GLUCOPHAGE) 1000 MG tablet Take 1 tablet (1,000 mg total) by mouth 2 (two) times daily with a meal. 180 tablet 3  . metoprolol tartrate (LOPRESSOR) 50 MG tablet TAKE 1 TABLET BY MOUTH TWICE A DAY 180 tablet 0  . Multiple Vitamins-Minerals (MULTIVITAMIN WITH MINERALS) tablet Take 1 tablet by mouth daily.    . pravastatin (PRAVACHOL) 40 MG tablet Take 1 tablet (40 mg total) by mouth daily. 90 tablet 0  . tiZANidine (ZANAFLEX) 4 MG tablet Take 1 tablet (4 mg total) by mouth every 8 (eight) hours as needed for muscle spasms. 90 tablet 1   No current facility-administered medications on file prior to visit.     Allergies  Allergen Reactions  . Chantix [Varenicline] Other (See Comments)    "Felt like having mental breakdown"  . Ciprofloxacin Other (See Comments)    Muscle tightness, tendon aching and pain  . Penicillins Anaphylaxis and Swelling  . Tamoxifen Other (See Comments)    Blood clots  . Levaquin [Levofloxacin] Other (See Comments)    Severe tendon pain  . Lyrica [Pregabalin] Swelling  . Neurontin [Gabapentin] Swelling  . Morphine And Related Other (See Comments)    MORPHINE DRIP - causes pt to feel irritable and itch  . Ceclor [Cefaclor] Rash  . Other Other (See Comments)    Redness from tape and bandaides  . Sulfa Antibiotics Rash    Past Medical History:  Diagnosis Date  . Anemia 01/28/2012  . Anxiety   . Cancer [facility]) 2004   ductal carcinoma; lumpectomy  . Cellulitis of chest wall 12/09/2015  . Complication of anesthesia    difficulty voiding after anesthesia  . COPD (chronic obstructive pulmonary disease) ([facility])    mild  . DDD (degenerative disc disease)   . Depression   . Diabetes mellitus without complication ([place])   . DJD (degenerative joint disease)   .  Dysrhythmia    hx palpitations?panic attack  . Fatty liver disease, nonalcoholic   . Fibromyalgia   . GERD (gastroesophageal reflux disease)   . Hip pain, right 10/12/2013  . History of blood clots 2004   during cancer treatment  . HTN (hypertension) 01/28/2012  . Hyperglycemia 01/28/2012  . Hyperlipidemia 01/28/2012  . Hypertension   . Hypothyroidism 02/14/2014  . Muscle spasm 12/30/2011  . Nasal polyp 08/30/2012  . Obesity   . Osteoarthritis   . Osteopenia   . Pain of left heel 01/28/2012  . Peripheral neuropathy   . Peripheral vascular disease ([place]) 04   axillary,upper chest after 3 weeks on tamoxifen  . Pneumonia    hx  .  Preventative health care 01/28/2012  . Shortness of breath dyspnea    occ on exersion  . Sleep apnea    on CPAP  . Tobacco abuse-unspec 10/12/2013  . Tuberculosis    latent  finishing 9 months tx on last month   Social History   Social History Narrative  . Not on file   Social History   Tobacco Use  . Smoking status: Former Smoker    Packs/day: 0.50    Years: 35.00    Pack years: 17.50    Types: Cigarettes  . Smokeless tobacco: Never Used  Substance Use Topics  . Alcohol use: No    Alcohol/week: 0.0 oz  . Drug use: No   family history includes Arthritis in her brother, daughter, and son; Benign prostatic hyperplasia in her brother; Breast cancer in her brother and sister; Breast cancer (age of onset: 56) in her maternal aunt; COPD in her brother and father; Cancer in her cousin; Cancer (age of onset: 35) in her father; Cancer (age of onset: 105) in her mother; Cirrhosis in her brother; Colon cancer in her cousin; Colon cancer (age of onset: 42) in her other; Coronary artery disease in her maternal grandmother; Dementia in her maternal grandmother; Diverticulitis in her sister; Emphysema in her paternal aunt; Gout in her sister; Heart Problems in her maternal grandmother; Heart disease in her mother and sister; Hyperlipidemia in her brother, mother, and  sister; Hypertension in her brother, brother, brother, brother, mother, and sister; Kidney disease in her brother; Leukemia in her brother and cousin; Lung cancer in her cousin and paternal aunt; Melanoma in her brother and other; Mental illness in her brother; Myelodysplastic syndrome in her brother; Non-Hodgkin's lymphoma (age of onset: 33) in her sister; Osteoporosis in her mother; Other in her daughter, daughter, and sister; Prostate cancer in her brother, brother, and brother; Vision loss in her maternal grandmother and paternal grandfather.     Objective:  BP 120/68   Pulse 79   Temp 98.5 F (36.9 C) (Oral)   Resp 18   Ht 5\' 4"  (1.626 m)   Wt 226 lb (102.5 kg)   SpO2 96%   BMI 38.79 kg/m  Body mass index is 38.79 kg/m.  Wt Readings from Last 3 Encounters:  10/31/17 226 lb (102.5 kg)  10/08/17 225 lb (102.1 kg)  09/05/17 231 lb (104.8 kg)    Physical Exam  Constitutional: She is oriented to person, place, and time. She appears well-developed and well-nourished.  Cardiovascular: Normal rate, regular rhythm and normal heart sounds.  No murmur heard. Pulmonary/Chest: Effort normal.  Musculoskeletal: She exhibits tenderness.       Right knee: She exhibits no laceration (healed scar present).       Left knee: She exhibits decreased range of motion (unable to get in ful flexion), swelling (mild) and ecchymosis (mild over patella). She exhibits no LCL laxity, normal patellar mobility, normal meniscus and no MCL laxity. Tenderness (mild) found. Medial joint line (chronic per patient) tenderness noted. No MCL and no LCL tenderness noted.       Arms: Neurological: She is alert and oriented to person, place, and time.  Psychiatric: She has a normal mood and affect. Her behavior is normal. Judgment and thought content normal.    Assessment and Plan :  1. Splinter of hand without major open wound or infection, right, initial encounter Extracted 2 wooden splinters. Slight surrounding  erythema with mild seropurulent fluid expunged. Cleaned and dressed wound. No acute infection noted.  2. Fall, initial encounter 3. Acute pain of left knee Pain is minimal over patella. Patient describes pain localized more so in quadricep and calf from radiation. Will forego radiographs at this time and patient was agreeable to this. May use mobic. Supplement with ice over knee and heat over quadricep and calf as well as stretching exercises. Do not remain immobile - make sure knee is active. Likely is likely to be more sore tomorrow due to different activity than normal and fall.  D/w pt the likely progression of her pain. - meloxicam (MOBIC) 7.5 MG tablet; Take 1-2 tablets (7.5-15 mg total) by mouth daily.  Dispense: 30 tablet; Refill: 0   Patient verbalized to me that they understand the following: diagnosis, what is being done for them, what to expect and what should be done at home.  Their questions have been answered.  See after visit summary for patient specific instructions.  Windell Hummingbird PA-C  [facility] 10/31/2017 10:25 AM  Please note: Portions of this report may have been transcribed using dragon voice recognition software. Every effort was made to ensure accuracy; however, inadvertent computerized transcription errors may be present.

## 2017-10-31 NOTE — Patient Instructions (Signed)
     IF you received an x-ray today, you will receive an invoice from [facility]. Please contact [facility] at [phone] with questions or concerns regarding your invoice.   IF you received labwork today, you will receive an invoice from [facility]. Please contact [facility] at [phone] with questions or concerns regarding your invoice.   Our billing staff will not be able to assist you with questions regarding bills from these companies.  You will be contacted with the lab results as soon as they are available. The fastest way to get your results is to activate your My Chart account. Instructions are located on the last page of this paperwork. If you have not heard from us regarding the results in 2 weeks, please contact this office.     

## 2017-11-11 ENCOUNTER — Other Ambulatory Visit: Payer: Self-pay | Admitting: Family Medicine

## 2017-11-12 NOTE — Telephone Encounter (Signed)
tizanidine refill Last Refill:04/26/17 # 90 1 RF Last OV: 04/26/17 PCP: Dr Brigitte Pulse Pharmacy:[facility]

## 2017-11-13 NOTE — Telephone Encounter (Signed)
Pharm called to check on status of refill.

## 2017-11-14 NOTE — Telephone Encounter (Signed)
Please advise on refill.

## 2017-11-26 ENCOUNTER — Other Ambulatory Visit: Payer: Self-pay | Admitting: Physician Assistant

## 2017-11-26 DIAGNOSIS — M25562 Pain in left knee: Secondary | ICD-10-CM

## 2017-11-27 NOTE — Telephone Encounter (Signed)
Attempted to call patient and see how she was doing with her knee and if she still need the meloxicam 7.5 mg tab. No answer, left message for patient to call the office back.

## 2017-11-27 NOTE — Telephone Encounter (Signed)
Pt returned called and stated that knee was better and she does not need the Meloxicam. But she did have a question regarding a Psychiatric nurse . She would like a message back thru MyChart please.

## 2017-12-02 ENCOUNTER — Ambulatory Visit: Payer: Self-pay | Admitting: *Deleted

## 2017-12-02 NOTE — Telephone Encounter (Signed)
Pt is calling stating that she has diarrhea and when she burps it taste like sulfur she states she had chinese or japenese Saturday and then Sunday this started she states she has tried  Imodium and that did not work is there something else over the counter that can help she cant leave the house in fear of having an accident  Reason for Disposition . SEVERE diarrhea (e.g., 7 or more times / day more than normal)  Answer Assessment - Initial Assessment Questions 1. DIARRHEA SEVERITY: "How bad is the diarrhea?" "How many extra stools have you had in the past 24 hours than normal?"    - NO DIARRHEA (SCALE 0)   - MILD (SCALE 1-3): Few loose or mushy BMs; increase of 1-3 stools over normal daily number of stools; mild increase in ostomy output.   -  MODERATE (SCALE 4-7): Increase of 4-6 stools daily over normal; moderate increase in ostomy output. * SEVERE (SCALE 8-10; OR 'WORST POSSIBLE'): Increase of 7 or more stools daily over normal; moderate increase in ostomy output; incontinence.     severe 2. ONSET: "When did the diarrhea begin?"      Yesterday morning 3. BM CONSISTENCY: "How loose or watery is the diarrhea?"      watery 4. VOMITING: "Are you also vomiting?" If so, ask: "How many times in the past 24 hours?"      no 5. ABDOMINAL PAIN: "Are you having any abdominal pain?" If yes: "What does it feel like?" (e.g., crampy, dull, intermittent, constant)      Soreness from hyperactivity  6. ABDOMINAL PAIN SEVERITY: If present, ask: "How bad is the pain?"  (e.g., Scale 1-10; mild, moderate, or severe)   - MILD (1-3): doesn't interfere with normal activities, abdomen soft and not tender to touch    - MODERATE (4-7): interferes with normal activities or awakens from sleep, tender to touch    - SEVERE (8-10): excruciating pain, doubled over, unable to do any normal activities       mild 7. ORAL INTAKE: If vomiting, "Have you been able to drink liquids?" "How much fluids have you had in the past 24  hours?"     Patient has been drinking 8. HYDRATION: "Any signs of dehydration?" (e.g., dry mouth [not just dry lips], too weak to stand, dizziness, new weight loss) "When did you last urinate?"     No signs of dehydration 9. EXPOSURE: "Have you traveled to a foreign country recently?" "Have you been exposed to anyone with diarrhea?" "Could you have eaten any food that was spoiled?"     Patient thinks she had bad food 10. ANTIBIOTIC USE: "Are you taking antibiotics now or have you taken antibiotics in the past 2 months?"       no 11. OTHER SYMPTOMS: "Do you have any other symptoms?" (e.g., fever, blood in stool)       Burping- sulfa taste 12. PREGNANCY: "Is there any chance you are pregnant?" "When was your last menstrual period?"       n/a  Protocols used: DIARRHEA-A-AH

## 2017-12-02 NOTE — Telephone Encounter (Signed)
Call to patient- she reports she has made a turn for the better- she states the diarrhea has stopped and she has been able to eat a piece of toast. Patient has been able to hydrate through out the episode and she reports the medication is finally slowing things down. Patient is going to continue with the bland diet and hydration if she converts backwards she will call for appointment.

## 2017-12-04 ENCOUNTER — Other Ambulatory Visit: Payer: Self-pay | Admitting: Family Medicine

## 2017-12-07 ENCOUNTER — Other Ambulatory Visit: Payer: Self-pay | Admitting: Family Medicine

## 2017-12-07 DIAGNOSIS — M797 Fibromyalgia: Secondary | ICD-10-CM

## 2017-12-08 ENCOUNTER — Other Ambulatory Visit: Payer: Self-pay | Admitting: Family Medicine

## 2017-12-08 DIAGNOSIS — I10 Essential (primary) hypertension: Secondary | ICD-10-CM

## 2018-01-03 ENCOUNTER — Other Ambulatory Visit: Payer: Self-pay | Admitting: Family Medicine

## 2018-01-05 DIAGNOSIS — F419 Anxiety disorder, unspecified: Secondary | ICD-10-CM | POA: Insufficient documentation

## 2018-01-05 DIAGNOSIS — D179 Benign lipomatous neoplasm, unspecified: Secondary | ICD-10-CM

## 2018-01-05 DIAGNOSIS — K760 Fatty (change of) liver, not elsewhere classified: Secondary | ICD-10-CM | POA: Insufficient documentation

## 2018-01-05 DIAGNOSIS — M1990 Unspecified osteoarthritis, unspecified site: Secondary | ICD-10-CM | POA: Insufficient documentation

## 2018-01-05 DIAGNOSIS — N6451 Induration of breast: Secondary | ICD-10-CM

## 2018-01-05 DIAGNOSIS — F32A Depression, unspecified: Secondary | ICD-10-CM | POA: Insufficient documentation

## 2018-01-05 DIAGNOSIS — F329 Major depressive disorder, single episode, unspecified: Secondary | ICD-10-CM | POA: Insufficient documentation

## 2018-01-05 DIAGNOSIS — M25512 Pain in left shoulder: Secondary | ICD-10-CM

## 2018-01-05 HISTORY — DX: Benign lipomatous neoplasm, unspecified: D17.9

## 2018-01-10 ENCOUNTER — Other Ambulatory Visit: Payer: Self-pay

## 2018-01-10 ENCOUNTER — Encounter: Payer: Self-pay | Admitting: Family Medicine

## 2018-01-10 MED ORDER — PRAVASTATIN SODIUM 40 MG PO TABS: 40.0000 mg | ORAL_TABLET | Freq: Every day | ORAL | 0 refills | Status: DC

## 2018-01-11 ENCOUNTER — Emergency Department (HOSPITAL_COMMUNITY): Payer: 59

## 2018-01-11 ENCOUNTER — Encounter (HOSPITAL_COMMUNITY): Payer: Self-pay

## 2018-01-11 ENCOUNTER — Emergency Department (HOSPITAL_COMMUNITY)
Admission: EM | Admit: 2018-01-11 | Discharge: 2018-01-11 | Disposition: A | Discharge status: Home / Self Care | Payer: 59 | Attending: Emergency Medicine | Admitting: Emergency Medicine

## 2018-01-11 DIAGNOSIS — Z7984 Long term (current) use of oral hypoglycemic drugs: Secondary | ICD-10-CM | POA: Insufficient documentation

## 2018-01-11 DIAGNOSIS — Y92410 Unspecified street and highway as the place of occurrence of the external cause: Secondary | ICD-10-CM | POA: Diagnosis not present

## 2018-01-11 DIAGNOSIS — Y999 Unspecified external cause status: Secondary | ICD-10-CM | POA: Insufficient documentation

## 2018-01-11 DIAGNOSIS — M79643 Pain in unspecified hand: Secondary | ICD-10-CM | POA: Diagnosis not present

## 2018-01-11 DIAGNOSIS — R51 Headache: Secondary | ICD-10-CM | POA: Diagnosis not present

## 2018-01-11 DIAGNOSIS — E039 Hypothyroidism, unspecified: Secondary | ICD-10-CM | POA: Insufficient documentation

## 2018-01-11 DIAGNOSIS — Y939 Activity, unspecified: Secondary | ICD-10-CM | POA: Insufficient documentation

## 2018-01-11 DIAGNOSIS — Z96651 Presence of right artificial knee joint: Secondary | ICD-10-CM | POA: Diagnosis not present

## 2018-01-11 DIAGNOSIS — E119 Type 2 diabetes mellitus without complications: Secondary | ICD-10-CM | POA: Insufficient documentation

## 2018-01-11 DIAGNOSIS — I10 Essential (primary) hypertension: Secondary | ICD-10-CM | POA: Insufficient documentation

## 2018-01-11 DIAGNOSIS — Z87891 Personal history of nicotine dependence: Secondary | ICD-10-CM | POA: Diagnosis not present

## 2018-01-11 DIAGNOSIS — Z79899 Other long term (current) drug therapy: Secondary | ICD-10-CM | POA: Diagnosis not present

## 2018-01-11 DIAGNOSIS — R52 Pain, unspecified: Secondary | ICD-10-CM | POA: Diagnosis not present

## 2018-01-11 DIAGNOSIS — S161XXA Strain of muscle, fascia and tendon at neck level, initial encounter: Secondary | ICD-10-CM | POA: Diagnosis not present

## 2018-01-11 DIAGNOSIS — S199XXA Unspecified injury of neck, initial encounter: Secondary | ICD-10-CM | POA: Diagnosis present

## 2018-01-11 DIAGNOSIS — J449 Chronic obstructive pulmonary disease, unspecified: Secondary | ICD-10-CM | POA: Diagnosis not present

## 2018-01-11 MED ORDER — ACETAMINOPHEN 500 MG PO TABS
1000.0000 mg | ORAL_TABLET | Freq: Once | ORAL | Status: AC
  Administered 2018-01-11: 1000 mg via ORAL
  Filled 2018-01-11: qty 2
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
## 2018-01-11 NOTE — ED Notes (Signed)
Patient transported to X-ray 

## 2018-01-11 NOTE — ED Notes (Signed)
Pt verbalizes understanding of d/c instructions. Pt ambulatory at d/c with all belongings and with family.   
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
## 2018-01-11 NOTE — ED Notes (Signed)
ED Provider at bedside. 
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
## 2018-01-11 NOTE — ED Notes (Signed)
Patient transported to CT 
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
## 2018-01-16 ENCOUNTER — Telehealth: Payer: Self-pay | Admitting: Family Medicine

## 2018-01-16 ENCOUNTER — Encounter: Payer: 59 | Admitting: Family Medicine

## 2018-01-16 NOTE — Telephone Encounter (Signed)
Called pt. To reschedule visit with Dr. Brigitte Pulse on 02/10/18. Rescheduled with pt.

## 2018-01-21 ENCOUNTER — Ambulatory Visit: Payer: 59 | Admitting: Physician Assistant

## 2018-01-21 DIAGNOSIS — H0012 Chalazion right lower eyelid: Secondary | ICD-10-CM | POA: Diagnosis not present

## 2018-01-25 ENCOUNTER — Encounter: Payer: Self-pay | Admitting: Physician Assistant

## 2018-02-02 ENCOUNTER — Other Ambulatory Visit: Payer: Self-pay | Admitting: Family Medicine

## 2018-02-02 MED ORDER — PRAVASTATIN SODIUM 40 MG PO TABS: 40.0000 mg | ORAL_TABLET | Freq: Every day | ORAL | 2 refills | Status: DC
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
## 2018-02-06 ENCOUNTER — Encounter: Payer: 59 | Admitting: Physician Assistant

## 2018-02-10 ENCOUNTER — Encounter: Payer: 59 | Admitting: Family Medicine

## 2018-02-16 ENCOUNTER — Other Ambulatory Visit: Payer: Self-pay | Admitting: Family Medicine

## 2018-02-16 DIAGNOSIS — E038 Other specified hypothyroidism: Secondary | ICD-10-CM

## 2018-03-10 ENCOUNTER — Other Ambulatory Visit: Payer: Self-pay | Admitting: Family Medicine

## 2018-03-10 NOTE — Telephone Encounter (Signed)
Refills given for 30 days. Last office visit was 10/31/17. No office visit scheduled at this time.  Requested Prescriptions  Pending Prescriptions Disp Refills  . amitriptyline (ELAVIL) 25 MG tablet [Pharmacy Med Name: AMITRIPTYLINE HCL 25 MG TAB] 30 tablet 0    Sig: TAKE 1 TABLET BY MOUTH EVERYDAY AT BEDTIME     Psychiatry:  Antidepressants - Heterocyclics (TCAs) Passed - 03/10/2018 12:35 AM      Passed - Completed PHQ-2 or PHQ-9 in the last 360 days.      Passed - Valid encounter within last 6 months    Recent Outpatient Visits          4 months ago Splinter of hand without major open wound or infection, right, initial encounter   [facility] [place], PA-C   5 months ago Cough   [facility], Gelene Mink, PA-C   6 months ago Lipoma of torso   [facility], Laurey Arrow, MD   6 months ago Controlled type 2 diabetes mellitus with complication, without long-term current use of insulin [facility])   [facility], Laurey Arrow, MD   7 months ago Folliculitis of perineum   [facility], Laurey Arrow, MD           . hydrochlorothiazide (HYDRODIURIL) 25 MG tablet [Pharmacy Med Name: HYDROCHLOROTHIAZIDE 25 MG TAB] 30 tablet 0    Sig: TAKE 1 TABLET BY MOUTH EVERY DAY     Cardiovascular: Diuretics - Thiazide Passed - 03/10/2018 12:35 AM      Passed - Ca in normal range and within 360 days    Calcium  Date Value Ref Range Status  09/03/2017 8.9 8.7 - 10.3 mg/dL Final  09/15/2015 9.8 8.4 - 10.4 mg/dL Final         Passed - Cr in normal range and within 360 days    Creatinine  Date Value Ref Range Status  09/15/2015 0.9 0.6 - 1.1 mg/dL Final   Creat  Date Value Ref Range Status  01/25/2016 0.91 0.50 - 0.99 mg/dL Final    Comment:      For patients > or = 67 years of age: The upper reference limit for Creatinine is approximately 13% higher for people identified as African-American.      Creatinine, Ser  Date Value Ref  Range Status  09/03/2017 0.75 0.57 - 1.00 mg/dL Final         Passed - K in normal range and within 360 days    Potassium  Date Value Ref Range Status  09/03/2017 4.3 3.5 - 5.2 mmol/L Final  09/15/2015 4.0 3.5 - 5.1 mEq/L Final         Passed - Na in normal range and within 360 days    Sodium  Date Value Ref Range Status  09/03/2017 141 134 - 144 mmol/L Final  09/15/2015 141 136 - 145 mEq/L Final         Passed - Last BP in normal range    BP Readings from Last 1 Encounters:  01/11/18 132/78         Passed - Valid encounter within last 6 months    Recent Outpatient Visits          4 months ago Splinter of hand without major open wound or infection, right, initial encounter   [facility], Sarah L, PA-C   5 months ago Cough   [facility] Care  at [facility], Gelene Mink, PA-C   6 months ago Lipoma of torso   [facility], Laurey Arrow, MD   6 months ago Controlled type 2 diabetes mellitus with complication, without long-term current use of insulin [facility])   [facility], Laurey Arrow, MD   7 months ago Folliculitis of perineum   [facility], Laurey Arrow, MD

## 2018-03-14 ENCOUNTER — Other Ambulatory Visit: Payer: Self-pay | Admitting: Family Medicine

## 2018-03-14 DIAGNOSIS — M797 Fibromyalgia: Secondary | ICD-10-CM

## 2018-03-25 ENCOUNTER — Encounter: Payer: Self-pay | Admitting: Family Medicine

## 2018-03-25 ENCOUNTER — Other Ambulatory Visit: Payer: Self-pay | Admitting: Family Medicine

## 2018-03-25 DIAGNOSIS — I10 Essential (primary) hypertension: Secondary | ICD-10-CM

## 2018-03-26 NOTE — Telephone Encounter (Signed)
Patient has appointment scheduled- 12/4. Courtesy refill given. Requested Prescriptions  Pending Prescriptions Disp Refills  . metoprolol tartrate (LOPRESSOR) 50 MG tablet [Pharmacy Med Name: METOPROLOL TARTRATE 50 MG TAB] 60 tablet 0    Sig: TAKE 1 TABLET BY MOUTH TWICE A DAY     Cardiovascular:  Beta Blockers Passed - 03/25/2018 11:48 AM      Passed - Last BP in normal range    BP Readings from Last 1 Encounters:  01/11/18 132/78         Passed - Last Heart Rate in normal range    Pulse Readings from Last 1 Encounters:  01/11/18 68         Passed - Valid encounter within last 6 months    Recent Outpatient Visits          4 months ago Splinter of hand without major open wound or infection, right, initial encounter   [facility] [place], PA-C   5 months ago Cough   [facility] at [facility], Gelene Mink, PA-C   6 months ago Lipoma of torso   [facility], Laurey Arrow, MD   6 months ago Controlled type 2 diabetes mellitus with complication, without long-term current use of insulin [facility])   [facility], Laurey Arrow, MD   8 months ago Folliculitis of perineum   [facility], Laurey Arrow, MD      Future Appointments            In 2 weeks Rutherford Guys, MD [facility] at [place], [facility]

## 2018-04-09 ENCOUNTER — Encounter: Payer: Self-pay | Admitting: Family Medicine

## 2018-04-09 ENCOUNTER — Ambulatory Visit (INDEPENDENT_AMBULATORY_CARE_PROVIDER_SITE_OTHER): Payer: 59

## 2018-04-09 ENCOUNTER — Other Ambulatory Visit: Payer: Self-pay

## 2018-04-09 ENCOUNTER — Ambulatory Visit (INDEPENDENT_AMBULATORY_CARE_PROVIDER_SITE_OTHER): Payer: 59 | Admitting: Family Medicine

## 2018-04-09 ENCOUNTER — Ambulatory Visit: Payer: 59 | Admitting: Family Medicine

## 2018-04-09 VITALS — BP 141/84 | HR 68 | Temp 98.2°F | Ht 63.75 in | Wt 218.0 lb

## 2018-04-09 DIAGNOSIS — R05 Cough: Secondary | ICD-10-CM

## 2018-04-09 DIAGNOSIS — E1142 Type 2 diabetes mellitus with diabetic polyneuropathy: Secondary | ICD-10-CM | POA: Diagnosis not present

## 2018-04-09 DIAGNOSIS — Z1211 Encounter for screening for malignant neoplasm of colon: Secondary | ICD-10-CM

## 2018-04-09 DIAGNOSIS — J441 Chronic obstructive pulmonary disease with (acute) exacerbation: Secondary | ICD-10-CM

## 2018-04-09 DIAGNOSIS — I10 Essential (primary) hypertension: Secondary | ICD-10-CM

## 2018-04-09 DIAGNOSIS — R059 Cough, unspecified: Secondary | ICD-10-CM

## 2018-04-09 DIAGNOSIS — M797 Fibromyalgia: Secondary | ICD-10-CM

## 2018-04-09 DIAGNOSIS — E039 Hypothyroidism, unspecified: Secondary | ICD-10-CM

## 2018-04-09 DIAGNOSIS — E7800 Pure hypercholesterolemia, unspecified: Secondary | ICD-10-CM

## 2018-04-09 LAB — POC INFLUENZA A&B (BINAX/QUICKVUE)
Influenza A, POC: NEGATIVE
Influenza B, POC: NEGATIVE

## 2018-04-09 MED ORDER — BENZONATATE 100 MG PO CAPS: 100.0000 mg | ORAL_CAPSULE | Freq: Three times a day (TID) | ORAL | 0 refills | Status: DC | PRN

## 2018-04-09 MED ORDER — DULOXETINE HCL 30 MG PO CPEP: 90.0000 mg | ORAL_CAPSULE | Freq: Every day | ORAL | 1 refills | Status: DC

## 2018-04-09 MED ORDER — HYDROCHLOROTHIAZIDE 25 MG PO TABS: 25.0000 mg | ORAL_TABLET | Freq: Every day | ORAL | 1 refills | Status: DC

## 2018-04-09 MED ORDER — AMITRIPTYLINE HCL 25 MG PO TABS: ORAL_TABLET | ORAL | 1 refills | Status: DC

## 2018-04-09 MED ORDER — TIZANIDINE HCL 4 MG PO TABS: 4.0000 mg | ORAL_TABLET | Freq: Three times a day (TID) | ORAL | 1 refills | Status: DC | PRN

## 2018-04-09 MED ORDER — METOPROLOL TARTRATE 50 MG PO TABS: 50.0000 mg | ORAL_TABLET | Freq: Two times a day (BID) | ORAL | 1 refills | Status: DC

## 2018-04-09 MED ORDER — ALBUTEROL SULFATE HFA 108 (90 BASE) MCG/ACT IN AERS: INHALATION_SPRAY | RESPIRATORY_TRACT | 4 refills | Status: DC

## 2018-04-09 MED ORDER — DOXYCYCLINE HYCLATE 100 MG PO TABS: 100.0000 mg | ORAL_TABLET | Freq: Two times a day (BID) | ORAL | 0 refills | Status: DC

## 2018-04-09 NOTE — Progress Notes (Signed)
12/4/201912:32 PM  Susan [name] [birth year], 67 y.o. female [id number]  Chief Complaint  Patient presents with  . Medication Refill    cymbalta, elavil, hydrochlothaizide, lopressor, zanaflex, albuterol  . URI    having coughing sneezing. Was told she has copd by Brigitte Pulse. Thinks she may have bronchitis    HPI:   Patient is a 67 y.o. female with past medical history significant for HTN, hypothyroidism, DM2, GERD, DDD, anxiety and depression, COPD who presents today for med refill  PCP Dr Brigitte Pulse   Recent death of her brother, doing ok Has been able to do self care GAD 7 = 3  Woke up 4 days with croupy cough after sailing  Breathing treatment and DM tussin are helping with cough Cough is mildly productive, white, yellow No SOB No fever or chills  Tolerating higher dose of metformin well Does not check cbg   She is planning to have CPE in January   Lab Results  Component Value Date   HGBA1C 6.6 (H) 09/03/2017   HGBA1C 6.2 (H) 04/20/2017   HGBA1C 6.0 11/26/2016   Lab Results  Component Value Date   MICROALBUR 0.2 01/25/2016   LDLCALC 45 09/03/2017   CREATININE 0.75 09/03/2017    Fall Risk  04/09/2018 10/31/2017 10/08/2017 09/05/2017 07/25/2017  Falls in the past year? 0 Yes No No No  Number falls in past yr: - 1 - - -  Injury with Fall? - Yes - - -     Depression screen [facility] 2/9 10/31/2017 10/08/2017 09/05/2017  Decreased Interest 0 0 0  Down, Depressed, Hopeless 0 0 0  PHQ - 2 Score 0 0 0  Some recent data might be hidden    Allergies  Allergen Reactions  . Chantix [Varenicline] Other (See Comments)    "Felt like having mental breakdown"  . Ciprofloxacin Other (See Comments)    Muscle tightness, tendon aching and pain  . Penicillins Anaphylaxis and Swelling  . Tamoxifen Other (See Comments)    Blood clots  . Levaquin [Levofloxacin] Other (See Comments)    Severe tendon pain  . Lyrica [Pregabalin] Swelling  . Neurontin [Gabapentin] Swelling  . Morphine And  Related Other (See Comments)    MORPHINE DRIP - causes pt to feel irritable and itch  . Ceclor [Cefaclor] Rash  . Other Other (See Comments)    Redness from tape and bandaides  . Sulfa Antibiotics Rash    Prior to Admission medications   Medication Sig Start Date End Date Taking? Authorizing Provider  ADVAIR DISKUS 100-50 MCG/DOSE AEPB TAKE 1 PUFF BY MOUTH TWICE A DAY 10/09/17  Yes [name], MD  albuterol (PROVENTIL HFA;VENTOLIN HFA) 108 (90 Base) MCG/ACT inhaler INHALE 2 PUFFS INTO THE LUNGS EVERY 4 HOURS AS NEEDED FOR WHEEZING OR SHORTNESS OF BREATH OR COUGH 07/07/17  Yes [name], MD  amitriptyline (ELAVIL) 25 MG tablet TAKE 1 TABLET BY MOUTH EVERYDAY AT BEDTIME 03/10/18  Yes [name], MD  benzonatate (TESSALON) 100 MG capsule Take 1-2 capsules (100-200 mg total) by mouth 3 (three) times daily as needed for cough. 10/08/17  Yes McVey, Gelene Mink, PA-C  cholecalciferol (VITAMIN D) 1000 UNITS tablet Take 1,000 Units by mouth daily.   Yes [provider]  DULoxetine (CYMBALTA) 30 MG capsule TAKE 3 CAPSULES BY MOUTH EVERY DAY 03/14/18  Yes [name], MD  esomeprazole (NEXIUM) 20 MG capsule Take 20 mg by mouth daily at 12 noon.   Yes [provider]  fluticasone (FLONASE) 50 MCG/ACT nasal spray PLACE 2 SPRAYS INTO BOTH NOSTRILS AT BEDTIME. 10/09/17  Yes [name], MD  hydrochlorothiazide (HYDRODIURIL) 25 MG tablet TAKE 1 TABLET BY MOUTH EVERY DAY 03/10/18  Yes [name], MD  ibuprofen (ADVIL,MOTRIN) 200 MG tablet Take 800 mg by mouth every 6 (six) hours as needed for headache or mild pain.    Yes [provider]  ipratropium (ATROVENT) 0.03 % nasal spray PLACE 2 SPRAYS INTO THE NOSE 4 (FOUR) TIMES DAILY. 10/02/17  Yes [name], MD  levothyroxine (SYNTHROID, LEVOTHROID) 50 MCG tablet Take 1 tablet (50 mcg total) by mouth daily before breakfast. 02/26/17  Yes [name], MD  levothyroxine (SYNTHROID, LEVOTHROID) 50 MCG tablet TAKE 1 TABLET (50 MCG TOTAL) BY  MOUTH DAILY BEFORE BREAKFAST. 02/17/18  Yes [name], MD  meloxicam (MOBIC) 7.5 MG tablet Take 1-2 tablets (7.5-15 mg total) by mouth daily. 10/31/17  Yes Weber, Damaris Hippo, PA-C  metFORMIN (GLUCOPHAGE) 1000 MG tablet Take 1 tablet (1,000 mg total) by mouth 2 (two) times daily with a meal. 09/05/17  Yes [name], MD  metoprolol tartrate (LOPRESSOR) 50 MG tablet TAKE 1 TABLET BY MOUTH TWICE A DAY 03/26/18  Yes [name], MD  Multiple Vitamins-Minerals (MULTIVITAMIN WITH MINERALS) tablet Take 1 tablet by mouth daily.   Yes [provider]  pravastatin (PRAVACHOL) 40 MG tablet Take 1 tablet (40 mg total) by mouth daily. No more refills without office visit 02/02/18  Yes [name], MD  tiZANidine (ZANAFLEX) 4 MG tablet TAKE 1 TABLET (4 MG TOTAL) BY MOUTH EVERY 8 (EIGHT) HOURS AS NEEDED FOR MUSCLE SPASMS. 02/02/18  Yes [name], MD    Past Medical History:  Diagnosis Date  . Anemia 01/28/2012  . Anxiety   . Cancer [facility]) 2004   ductal carcinoma; lumpectomy  . Cellulitis of chest wall 12/09/2015  . Complication of anesthesia    difficulty voiding after anesthesia  . COPD (chronic obstructive pulmonary disease) (HCC)    mild  . DDD (degenerative disc disease)   . Depression   . Diabetes mellitus without complication ([place])   . DJD (degenerative joint disease)   . Dysrhythmia    hx palpitations?panic attack  . Fatty liver disease, nonalcoholic   . Fibromyalgia   . GERD (gastroesophageal reflux disease)   . Hip pain, right 10/12/2013  . History of blood clots 2004   during cancer treatment  . HTN (hypertension) 01/28/2012  . Hyperglycemia 01/28/2012  . Hyperlipidemia 01/28/2012  . Hypertension   . Hypothyroidism 02/14/2014  . Muscle spasm 12/30/2011  . Nasal polyp 08/30/2012  . Obesity   . Osteoarthritis   . Osteopenia   . Pain of left heel 01/28/2012  . Peripheral neuropathy   . Peripheral vascular disease ([place]) 04   axillary,upper chest after 3 weeks on tamoxifen  .  Pneumonia    hx  . Preventative health care 01/28/2012  . Shortness of breath dyspnea    occ on exersion  . Sleep apnea    on CPAP  . Tobacco abuse-unspec 10/12/2013  . Tuberculosis    latent  finishing 9 months tx on last month    Past Surgical History:  Procedure Laterality Date  . ABDOMINAL HYSTERECTOMY  1991  . APPENDECTOMY  91  . BILATERAL OOPHORECTOMY  01/2003  . BREAST SURGERY Left 2004   lumpectomy  . CARPAL TUNNEL RELEASE     right  . INCISION AND DRAINAGE  ABSCESS Left 12/13/2015   Procedure: DRAINAGE LEFT MASTECTOMY WOUND INFECTION;  Surgeon: Fanny Skates, MD;  Location: [facility];  Service: General;  Laterality: Left;  . JOINT REPLACEMENT     right total knee  . MASTECTOMY W/ SENTINEL NODE BIOPSY Right 10/26/2015  . MASTECTOMY W/ SENTINEL NODE BIOPSY Bilateral 10/26/2015   Procedure: RIGHT TOTAL MASTECTOMY WITH RIGHT SENTINEL LYMPH NODE BIOPSY, INJECT BLUE DYE RIGHT BREAST, LEFT BREAST PROPHYLACTIC MASTECTOMY;  Surgeon: Fanny Skates, MD;  Location: [place];  Service: General;  Laterality: Bilateral;  . NASAL SEPTUM SURGERY  08  . PILONIDAL CYST EXCISION    . THYROIDECTOMY, PARTIAL  mid 80's   right side    Social History   Tobacco Use  . Smoking status: Former Smoker    Packs/day: 0.50    Years: 35.00    Pack years: 17.50    Types: Cigarettes  . Smokeless tobacco: Never Used  Substance Use Topics  . Alcohol use: No    Alcohol/week: 0.0 standard drinks    Family History  Problem Relation Age of Onset  . Cancer Mother 98       liver cancer, hep c  . Heart disease Mother        chf  . Hypertension Mother   . Hyperlipidemia Mother   . Osteoporosis Mother   . Cancer Father 2       lung; smoker  . COPD Father   . Heart disease Sister        mvp  . Breast cancer Sister        dx. 59s  . Non-Hodgkin's lymphoma Sister 90       large B cell  . Hypertension Brother   . Arthritis Brother   . Kidney disease Brother   . Prostate cancer Brother        dx.  early 15s  . Other Daughter        Beal's Syndrome  . Arthritis Daughter        Beal's  . Arthritis Son        Beal's connective tissue disease  . Vision loss Maternal Grandmother   . Dementia Maternal Grandmother   . Coronary artery disease Maternal Grandmother   . Heart Problems Maternal Grandmother   . Vision loss Paternal Grandfather   . Hypertension Brother   . Benign prostatic hyperplasia Brother   . Leukemia Brother        chronic lymphatic leukemia - small/B cell  . Hypertension Brother   . COPD Brother   . Prostate cancer Brother        dx. mid-60s  . Myelodysplastic syndrome Brother        dx. early 39s  . Hypertension Brother   . Hyperlipidemia Brother   . Prostate cancer Brother        dx. mid-70s  . Melanoma Brother        (x2) melanomas dx. late 60s-early 31s  . Mental illness Brother        schizophrenia  . Breast cancer Brother        dx. 19s  . Cirrhosis Brother        hep c  . Hypertension Sister   . Other Sister        thalessemia, anemia; hx of hysterectomy in her 38s  . Hyperlipidemia Sister   . Gout Sister   . Diverticulitis Sister   . Other Daughter        overweight  . Breast cancer Maternal  Aunt 46  . Lung cancer Cousin        maternal 1st cousin dx. 30 or younger; former smoker  . Colon cancer Cousin        maternal 1st cousin dx. late 64s-early 60s  . Cancer Cousin        maternal 1st cousin d. NOS cancer  . Emphysema Paternal Aunt   . Lung cancer Paternal Aunt        d. early 85s; smoker  . Leukemia Cousin        paternal 1st cousin d. early 77s  . Colon cancer Other 35       niece  . Melanoma Other        niece    ROS Per hpi  OBJECTIVE:  Blood pressure (!) 141/84, pulse 68, temperature 98.2 F (36.8 C), temperature source Oral, height 5' 3.75" (1.619 m), weight 218 lb (98.9 kg), SpO2 99 %. Body mass index is 37.71 kg/m.   BP Readings from Last 3 Encounters:  04/09/18 (!) 141/84  01/11/18 132/78  10/31/17 120/68    Wt Readings from Last 3 Encounters:  04/09/18 218 lb (98.9 kg)  01/11/18 227 lb (103 kg)  10/31/17 226 lb (102.5 kg)     Physical Exam  Constitutional: She is oriented to person, place, and time. She appears well-developed and well-nourished.  HENT:  Head: Normocephalic and atraumatic.  Right Ear: Hearing, tympanic membrane, external ear and ear canal normal.  Left Ear: Hearing, tympanic membrane, external ear and ear canal normal.  Mouth/Throat: Oropharynx is clear and moist. No oropharyngeal exudate.  Eyes: Pupils are equal, round, and reactive to light. Conjunctivae and EOM are normal. No scleral icterus.  Neck: Neck supple.  Cardiovascular: Normal rate, regular rhythm and normal heart sounds. Exam reveals no gallop and no friction rub.  No murmur heard. Pulmonary/Chest: Effort normal. She has wheezes. She has no rales.  Musculoskeletal: She exhibits no edema.  Lymphadenopathy:    She has no cervical adenopathy.  Neurological: She is alert and oriented to person, place, and time.  Skin: Skin is warm.  Clamy, flushed  Psychiatric: She has a normal mood and affect.  Nursing note and vitals reviewed.   Results for orders placed or performed in visit on 04/09/18 (from the past 24 hour(s))  POC Influenza A&B(BINAX/QUICKVUE)     Status: None   Collection Time: 04/09/18  1:15 PM  Result Value Ref Range   Influenza A, POC Negative Negative   Influenza B, POC Negative Negative    Dg Chest 2 View  Result Date: 04/09/2018 CLINICAL DATA:  Cough and wheezing EXAM: CHEST - 2 VIEW COMPARISON:  Chest x-rays dated 08/06/2017, 07/10/2017 and 05/22/2016. FINDINGS: Heart size and mediastinal contours are stable. Chronic bronchitic changes noted centrally. Lungs otherwise clear. No confluent opacity to suggest a developing pneumonia. No pleural effusion or pneumothorax seen. No acute or suspicious osseous finding. IMPRESSION: 1. No active cardiopulmonary disease. No evidence of pneumonia or  pulmonary edema. 2. Chronic bronchitic changes. Electronically Signed   By: Franki Cabot M.D.   On: 04/09/2018 13:28     ASSESSMENT and PLAN  1. COPD exacerbation ([place]) Discussed supportive measures, new meds r/se/b and RTC precautions.  2. Controlled type 2 diabetes mellitus with diabetic polyneuropathy, without long-term current use of insulin ([place]) Checking labs today, medications will be adjusted as needed.  - TSH - CMP14+EGFR - Microalbumin/Creatinine Ratio, Urine - Hemoglobin A1c  3. Colon cancer screening - Cologuard  4. Essential  hypertension Elevated today in setting of acute illness. Check BP at home, bring readings to next appt. Consider ACE/ARB.  - metoprolol tartrate (LOPRESSOR) 50 MG tablet; Take 1 tablet (50 mg total) by mouth 2 (two) times daily.  5. Acquired hypothyroidism Checking labs today, medications will be adjusted as needed.   6. Pure hypercholesterolemia Checking labs today, medications will be adjusted as needed.   7. Cough See #1 - DG Chest 2 View; Future - POC Influenza A&B(BINAX/QUICKVUE) - benzonatate (TESSALON) 100 MG capsule; Take 1-2 capsules (100-200 mg total) by mouth 3 (three) times daily as needed for cough.  8. Fibromyalgia muscle pain Controlled. Continue current regime.  - DULoxetine (CYMBALTA) 30 MG capsule; Take 3 capsules (90 mg total) by mouth daily.  Other orders - albuterol (PROVENTIL HFA;VENTOLIN HFA) 108 (90 Base) MCG/ACT inhaler; INHALE 2 PUFFS INTO THE LUNGS EVERY 4 HOURS AS NEEDED FOR WHEEZING OR SHORTNESS OF BREATH OR COUGH - amitriptyline (ELAVIL) 25 MG tablet; TAKE 1 TABLET BY MOUTH EVERYDAY AT BEDTIME - hydrochlorothiazide (HYDRODIURIL) 25 MG tablet; Take 1 tablet (25 mg total) by mouth daily. - tiZANidine (ZANAFLEX) 4 MG tablet; Take 1 tablet (4 mg total) by mouth every 8 (eight) hours as needed for muscle spasms. - doxycycline (VIBRA-TABS) 100 MG tablet; Take 1 tablet (100 mg total) by mouth 2 (two) times daily.      Return for jan CPE.    Rutherford Guys, MD Primary Care at [facility] [place], [place] [address] Ph.  [phone] Fax [phone]

## 2018-04-09 NOTE — Patient Instructions (Signed)
° ° ° °  If you have lab work done today you will be contacted with your lab results within the next 2 weeks.  If you have not heard from us then please contact us. The fastest way to get your results is to register for My Chart. ° ° °IF you received an x-ray today, you will receive an invoice from [facility]. Please contact [facility] at [phone] with questions or concerns regarding your invoice.  ° °IF you received labwork today, you will receive an invoice from [facility]. Please contact [facility] at [phone] with questions or concerns regarding your invoice.  ° °Our billing staff will not be able to assist you with questions regarding bills from these companies. ° °You will be contacted with the lab results as soon as they are available. The fastest way to get your results is to activate your My Chart account. Instructions are located on the last page of this paperwork. If you have not heard from us regarding the results in 2 weeks, please contact this office. °  ° ° ° °

## 2018-04-10 ENCOUNTER — Other Ambulatory Visit: Payer: Self-pay

## 2018-04-10 LAB — CMP14+EGFR
ALT: 46 IU/L — ABNORMAL HIGH (ref 0–32)
AST: 26 IU/L (ref 0–40)
Albumin/Globulin Ratio: 1.9 (ref 1.2–2.2)
Albumin: 4.5 g/dL (ref 3.6–4.8)
Alkaline Phosphatase: 92 IU/L (ref 39–117)
BUN/Creatinine Ratio: 15 (ref 12–28)
BUN: 12 mg/dL (ref 8–27)
Bilirubin Total: 0.3 mg/dL (ref 0.0–1.2)
CO2: 22 mmol/L (ref 20–29)
Calcium: 9.8 mg/dL (ref 8.7–10.3)
Chloride: 99 mmol/L (ref 96–106)
Creatinine, Ser: 0.81 mg/dL (ref 0.57–1.00)
GFR calc Af Amer: 88 mL/min/{1.73_m2} (ref >59)
GFR calc non Af Amer: 76 mL/min/{1.73_m2} (ref >59)
Globulin, Total: 2.4 g/dL (ref 1.5–4.5)
Glucose: 81 mg/dL (ref 65–99)
Potassium: 4.3 mmol/L (ref 3.5–5.2)
Sodium: 141 mmol/L (ref 134–144)
Total Protein: 6.9 g/dL (ref 6.0–8.5)

## 2018-04-10 LAB — MICROALBUMIN / CREATININE URINE RATIO
Creatinine, Urine: 61 mg/dL
Microalb/Creat Ratio: <4.9 mg/g creat (ref 0.0–30.0)
Microalbumin, Urine: <3 ug/mL

## 2018-04-10 LAB — HEMOGLOBIN A1C
Est. average glucose Bld gHb Est-mCnc: 131 mg/dL
Hgb A1c MFr Bld: 6.2 % — ABNORMAL HIGH (ref 4.8–5.6)

## 2018-04-10 LAB — TSH: TSH: 1.05 u[IU]/mL (ref 0.450–4.500)

## 2018-04-10 MED ORDER — ALBUTEROL SULFATE (2.5 MG/3ML) 0.083% IN NEBU: 2.5000 mg | INHALATION_SOLUTION | Freq: Four times a day (QID) | RESPIRATORY_TRACT | 1 refills | Status: DC | PRN

## 2018-04-21 ENCOUNTER — Encounter: Payer: 59 | Admitting: Family Medicine

## 2018-04-24 ENCOUNTER — Ambulatory Visit: Payer: 59 | Admitting: Family Medicine

## 2018-05-09 ENCOUNTER — Encounter: Payer: Self-pay | Admitting: Family Medicine

## 2018-05-11 DIAGNOSIS — Z1211 Encounter for screening for malignant neoplasm of colon: Secondary | ICD-10-CM

## 2018-05-11 HISTORY — DX: Encounter for screening for malignant neoplasm of colon: Z12.11

## 2018-05-16 LAB — COLOGUARD: Cologuard: NEGATIVE

## 2018-05-19 ENCOUNTER — Encounter: Payer: 59 | Admitting: Family Medicine

## 2018-05-20 ENCOUNTER — Ambulatory Visit (INDEPENDENT_AMBULATORY_CARE_PROVIDER_SITE_OTHER): Payer: Medicare Other | Admitting: Family Medicine

## 2018-05-20 ENCOUNTER — Other Ambulatory Visit: Payer: Self-pay

## 2018-05-20 ENCOUNTER — Encounter: Payer: Self-pay | Admitting: Family Medicine

## 2018-05-20 VITALS — BP 138/82 | HR 73 | Temp 98.6°F | Resp 17 | Ht 63.75 in | Wt 222.2 lb

## 2018-05-20 DIAGNOSIS — I10 Essential (primary) hypertension: Secondary | ICD-10-CM | POA: Diagnosis not present

## 2018-05-20 DIAGNOSIS — E039 Hypothyroidism, unspecified: Secondary | ICD-10-CM

## 2018-05-20 DIAGNOSIS — M797 Fibromyalgia: Secondary | ICD-10-CM | POA: Diagnosis not present

## 2018-05-20 DIAGNOSIS — E1142 Type 2 diabetes mellitus with diabetic polyneuropathy: Secondary | ICD-10-CM | POA: Diagnosis not present

## 2018-05-20 DIAGNOSIS — H9193 Unspecified hearing loss, bilateral: Secondary | ICD-10-CM

## 2018-05-20 DIAGNOSIS — Z0001 Encounter for general adult medical examination with abnormal findings: Secondary | ICD-10-CM

## 2018-05-20 DIAGNOSIS — F17200 Nicotine dependence, unspecified, uncomplicated: Secondary | ICD-10-CM

## 2018-05-20 DIAGNOSIS — Z0000 Encounter for general adult medical examination without abnormal findings: Secondary | ICD-10-CM

## 2018-05-20 MED ORDER — PRAVASTATIN SODIUM 40 MG PO TABS: 40.0000 mg | ORAL_TABLET | Freq: Every day | ORAL | 3 refills | Status: DC

## 2018-05-20 MED ORDER — METOPROLOL TARTRATE 50 MG PO TABS: 50.0000 mg | ORAL_TABLET | Freq: Two times a day (BID) | ORAL | 1 refills | Status: DC

## 2018-05-20 MED ORDER — METFORMIN HCL 1000 MG PO TABS: 1000.0000 mg | ORAL_TABLET | Freq: Two times a day (BID) | ORAL | 1 refills | Status: DC

## 2018-05-20 MED ORDER — TIZANIDINE HCL 4 MG PO TABS: 4.0000 mg | ORAL_TABLET | Freq: Every day | ORAL | 1 refills | Status: DC

## 2018-05-20 MED ORDER — DULOXETINE HCL 30 MG PO CPEP: 90.0000 mg | ORAL_CAPSULE | Freq: Every day | ORAL | 1 refills | Status: DC

## 2018-05-20 MED ORDER — HYDROCHLOROTHIAZIDE 25 MG PO TABS: 25.0000 mg | ORAL_TABLET | Freq: Every day | ORAL | 1 refills | Status: DC

## 2018-05-20 MED ORDER — AMITRIPTYLINE HCL 25 MG PO TABS: ORAL_TABLET | ORAL | 1 refills | Status: DC

## 2018-05-20 MED ORDER — ESOMEPRAZOLE MAGNESIUM 20 MG PO CPDR: 20.0000 mg | DELAYED_RELEASE_CAPSULE | Freq: Every day | ORAL | 3 refills | Status: DC

## 2018-05-20 MED ORDER — IPRATROPIUM BROMIDE 0.03 % NA SOLN: 2.0000 | Freq: Four times a day (QID) | NASAL | 1 refills | Status: DC

## 2018-05-20 MED ORDER — LEVOTHYROXINE SODIUM 50 MCG PO TABS: 50.0000 ug | ORAL_TABLET | Freq: Every day | ORAL | 1 refills | Status: DC

## 2018-05-20 MED ORDER — ALBUTEROL SULFATE HFA 108 (90 BASE) MCG/ACT IN AERS: INHALATION_SPRAY | RESPIRATORY_TRACT | 4 refills | Status: DC

## 2018-05-20 MED ORDER — FLUTICASONE-SALMETEROL 100-50 MCG/DOSE IN AEPB: INHALATION_SPRAY | RESPIRATORY_TRACT | 1 refills | Status: DC

## 2018-05-20 NOTE — Progress Notes (Signed)
QUICK REFERENCE INFORMATION: The ABCs of Providing the Annual Wellness Visit  CMS.gov Medicare Learning Network  [employer] Annual Wellness Visit  Subjective:   Susan [name] is a 67 y.o. Female who presents for an Annual Wellness Visit.  Welcome to Medicare visit   Reviewed with patient that medicare wellness exams are a way to review health maintenance She recently had an ECG She would like an evaluation for hearing problems   She is s/p double mastectomy and denies lymphedema or chest wall pain She has fibromyalgia and needs refills of cymbalta No side effects of cymbalta  She also has history of tobacco use and continues to smoke She states that her family is getting on her about her smoking because so many of her health issues would improve if she could just stop smoking She reports that she will try the patch She reports that chantix took her to a dark place  Hypertension: Patient here for follow-up of elevated blood pressure. She is not exercising and is adherent to low salt diet.  Blood pressure is well controlled at home. Cardiac symptoms none. Patient denies chest pain, chest pressure/discomfort, claudication, dyspnea, exertional chest pressure/discomfort, fatigue and irregular heart beat.  Cardiovascular risk factors: diabetes mellitus, dyslipidemia, hypertension, obesity (BMI >= 30 kg/m2) and smoking/ tobacco exposure.  BP Readings from Last 3 Encounters:  05/20/18 138/82  04/09/18 (!) 141/84  01/11/18 132/78    Patient Active Problem List   Diagnosis Date Noted  . Induration, breast   . Left shoulder pain   . Cancer of central portion of right breast ([place]) 10/26/2015  . Genetic testing 10/17/2015  . History of breast cancer in female 09/15/2015  . Breast cancer of upper-outer quadrant of right female breast ([place]) 09/15/2015  . Family history of breast cancer in female 09/15/2015  . Family history of colon cancer 09/15/2015  . TB lung, latent 01/11/2015  . COPD  (chronic obstructive pulmonary disease) ([place]) 10/07/2014  . Mediastinal lymphadenopathy 10/07/2014  . Diabetes mellitus type 2, controlled ([place]) 09/29/2014  . Pulmonary nodule 09/29/2014  . History of chest pain 09/29/2014  . Essential hypertension 09/29/2014  . Chest pain 09/23/2014  . Hypothyroidism 02/14/2014  . Hip pain, right 10/12/2013  . Cervical cancer screening 03/09/2013  . Seasonal allergies 09/12/2012  . Nasal polyp 08/30/2012  . SOM (serous otitis media) 08/30/2012  . Peripheral neuropathy 07/19/2012  . Cervical myofascial pain syndrome 06/08/2012  . Anemia 01/28/2012  . Hyperlipidemia 01/28/2012  . Hyperglycemia 01/28/2012  . Pain of left heel 01/28/2012  . Neck pain 12/30/2011  . Muscle spasm 12/30/2011  . Anxiety and depression   . Osteoarthritis   . GERD (gastroesophageal reflux disease)   . Fibromyalgia   . Sleep apnea   . Obesity   . History of blood clots   . Fatty liver disease, nonalcoholic   . DDD (degenerative disc disease)     Past Medical History:  Diagnosis Date  . Anemia 01/28/2012  . Anxiety   . Cancer [facility]) 2004   ductal carcinoma; lumpectomy  . Cellulitis of chest wall 12/09/2015  . Complication of anesthesia    difficulty voiding after anesthesia  . COPD (chronic obstructive pulmonary disease) ([facility])    mild  . DDD (degenerative disc disease)   . Depression   . Diabetes mellitus without complication ([place])   . DJD (degenerative joint disease)   . Dysrhythmia    hx palpitations?panic attack  . Fatty liver disease, nonalcoholic   . Fibromyalgia   .  GERD (gastroesophageal reflux disease)   . Hip pain, right 10/12/2013  . History of blood clots 2004   during cancer treatment  . HTN (hypertension) 01/28/2012  . Hyperglycemia 01/28/2012  . Hyperlipidemia 01/28/2012  . Hypertension   . Hypothyroidism 02/14/2014  . Muscle spasm 12/30/2011  . Nasal polyp 08/30/2012  . Obesity   . Osteoarthritis   . Osteopenia   . Pain of left heel  01/28/2012  . Peripheral neuropathy   . Peripheral vascular disease ([employer] [employer]) 04   axillary,upper chest after 3 weeks on tamoxifen  . Pneumonia    hx  . Preventative health care 01/28/2012  . Shortness of breath dyspnea    occ on exersion  . Sleep apnea    on CPAP  . Tobacco abuse-unspec 10/12/2013  . Tuberculosis    latent  finishing 9 months tx on last month     Past Surgical History:  Procedure Laterality Date  . ABDOMINAL HYSTERECTOMY  1991  . APPENDECTOMY  91  . BILATERAL OOPHORECTOMY  01/2003  . BREAST SURGERY Left 2004   lumpectomy  . CARPAL TUNNEL RELEASE     right  . INCISION AND DRAINAGE ABSCESS Left 12/13/2015   Procedure: DRAINAGE LEFT MASTECTOMY WOUND INFECTION;  Surgeon: Fanny Skates, MD;  Location: [place];  Service: General;  Laterality: Left;  . JOINT REPLACEMENT     right total knee  . MASTECTOMY W/ SENTINEL NODE BIOPSY Right 10/26/2015  . MASTECTOMY W/ SENTINEL NODE BIOPSY Bilateral 10/26/2015   Procedure: RIGHT TOTAL MASTECTOMY WITH RIGHT SENTINEL LYMPH NODE BIOPSY, INJECT BLUE DYE RIGHT BREAST, LEFT BREAST PROPHYLACTIC MASTECTOMY;  Surgeon: Fanny Skates, MD;  Location: [place];  Service: General;  Laterality: Bilateral;  . NASAL SEPTUM SURGERY  08  . PILONIDAL CYST EXCISION    . THYROIDECTOMY, PARTIAL  mid 80's   right side     Outpatient Medications Prior to Visit  Medication Sig Dispense Refill  . albuterol (PROVENTIL) (2.5 MG/3ML) 0.083% nebulizer solution Take 3 mLs (2.5 mg total) by nebulization every 6 (six) hours as needed for wheezing or shortness of breath. 150 mL 1  . cholecalciferol (VITAMIN D) 1000 UNITS tablet Take 1,000 Units by mouth daily.    Marland Kitchen ibuprofen (ADVIL,MOTRIN) 200 MG tablet Take 800 mg by mouth every 6 (six) hours as needed for headache or mild pain.     . Multiple Vitamins-Minerals (MULTIVITAMIN WITH MINERALS) tablet Take 1 tablet by mouth daily.    Marland Kitchen ADVAIR DISKUS 100-50 MCG/DOSE AEPB TAKE 1 PUFF BY MOUTH TWICE A DAY 60 each 0  .  albuterol (PROVENTIL HFA;VENTOLIN HFA) 108 (90 Base) MCG/ACT inhaler INHALE 2 PUFFS INTO THE LUNGS EVERY 4 HOURS AS NEEDED FOR WHEEZING OR SHORTNESS OF BREATH OR COUGH 18 Inhaler 4  . amitriptyline (ELAVIL) 25 MG tablet TAKE 1 TABLET BY MOUTH EVERYDAY AT BEDTIME 90 tablet 1  . DULoxetine (CYMBALTA) 30 MG capsule Take 3 capsules (90 mg total) by mouth daily. 270 capsule 1  . esomeprazole (NEXIUM) 20 MG capsule Take 20 mg by mouth daily at 12 noon.    . fluticasone (FLONASE) 50 MCG/ACT nasal spray PLACE 2 SPRAYS INTO BOTH NOSTRILS AT BEDTIME. 16 g 2  . hydrochlorothiazide (HYDRODIURIL) 25 MG tablet Take 1 tablet (25 mg total) by mouth daily. 90 tablet 1  . ipratropium (ATROVENT) 0.03 % nasal spray PLACE 2 SPRAYS INTO THE NOSE 4 (FOUR) TIMES DAILY. 30 mL 1  . levothyroxine (SYNTHROID, LEVOTHROID) 50 MCG tablet Take 1 tablet (50 mcg  total) by mouth daily before breakfast. 90 tablet 1  . metFORMIN (GLUCOPHAGE) 1000 MG tablet Take 1 tablet (1,000 mg total) by mouth 2 (two) times daily with a meal. 180 tablet 3  . metoprolol tartrate (LOPRESSOR) 50 MG tablet Take 1 tablet (50 mg total) by mouth 2 (two) times daily. 180 tablet 1  . pravastatin (PRAVACHOL) 40 MG tablet Take 1 tablet (40 mg total) by mouth daily. No more refills without office visit 90 tablet 2  . tiZANidine (ZANAFLEX) 4 MG tablet Take 1 tablet (4 mg total) by mouth every 8 (eight) hours as needed for muscle spasms. 90 tablet 1  . benzonatate (TESSALON) 100 MG capsule Take 1-2 capsules (100-200 mg total) by mouth 3 (three) times daily as needed for cough. (Patient not taking: Reported on 05/20/2018) 40 capsule 0  . doxycycline (VIBRA-TABS) 100 MG tablet Take 1 tablet (100 mg total) by mouth 2 (two) times daily. (Patient not taking: Reported on 05/20/2018) 20 tablet 0  . meloxicam (MOBIC) 7.5 MG tablet Take 1-2 tablets (7.5-15 mg total) by mouth daily. (Patient not taking: Reported on 05/20/2018) 30 tablet 0   No facility-administered medications  prior to visit.     Allergies  Allergen Reactions  . Chantix [Varenicline] Other (See Comments)    "Felt like having mental breakdown"  . Ciprofloxacin Other (See Comments)    Muscle tightness, tendon aching and pain  . Penicillins Anaphylaxis and Swelling  . Tamoxifen Other (See Comments)    Blood clots  . Levaquin [Levofloxacin] Other (See Comments)    Severe tendon pain  . Lyrica [Pregabalin] Swelling  . Neurontin [Gabapentin] Swelling  . Morphine And Related Other (See Comments)    MORPHINE DRIP - causes pt to feel irritable and itch  . Ceclor [Cefaclor] Rash  . Other Other (See Comments)    Redness from tape and bandaides  . Sulfa Antibiotics Rash     Family History  Problem Relation Age of Onset  . Cancer Mother 15       liver cancer, hep c  . Heart disease Mother        chf  . Hypertension Mother   . Hyperlipidemia Mother   . Osteoporosis Mother   . Cancer Father 46       lung; smoker  . COPD Father   . Heart disease Sister        mvp  . Breast cancer Sister        dx. 39s  . Non-Hodgkin's lymphoma Sister 8       large B cell  . Hypertension Brother   . Arthritis Brother   . Kidney disease Brother   . Prostate cancer Brother        dx. early 2s  . Other Daughter        Beal's Syndrome  . Arthritis Daughter        Beal's  . Arthritis Son        Beal's connective tissue disease  . Vision loss Maternal Grandmother   . Dementia Maternal Grandmother   . Coronary artery disease Maternal Grandmother   . Heart Problems Maternal Grandmother   . Vision loss Paternal Grandfather   . Hypertension Brother   . Benign prostatic hyperplasia Brother   . Leukemia Brother        chronic lymphatic leukemia - small/B cell  . Hypertension Brother   . COPD Brother   . Prostate cancer Brother        dx. mid-60s  .  Myelodysplastic syndrome Brother        dx. early 67s  . Hypertension Brother   . Hyperlipidemia Brother   . Prostate cancer Brother        dx.  mid-70s  . Melanoma Brother        (x2) melanomas dx. late 60s-early 55s  . Mental illness Brother        schizophrenia  . Breast cancer Brother        dx. 54s  . Cirrhosis Brother        hep c  . Hypertension Sister   . Other Sister        thalessemia, anemia; hx of hysterectomy in her 50s  . Hyperlipidemia Sister   . Gout Sister   . Diverticulitis Sister   . Other Daughter        overweight  . Breast cancer Maternal Aunt 78  . Lung cancer Cousin        maternal 1st cousin dx. 31 or younger; former smoker  . Colon cancer Cousin        maternal 1st cousin dx. late 14s-early 60s  . Cancer Cousin        maternal 1st cousin d. NOS cancer  . Emphysema Paternal Aunt   . Lung cancer Paternal Aunt        d. early 17s; smoker  . Leukemia Cousin        paternal 1st cousin d. early 30s  . Colon cancer Other 106       niece  . Melanoma Other        niece     Social History   Socioeconomic History  . Marital status: Married    Spouse name: Not on file  . Number of children: Not on file  . Years of education: Not on file  . Highest education level: Not on file  Occupational History  . Not on file  Social Needs  . Financial resource strain: Not on file  . Food insecurity:    Worry: Not on file    Inability: Not on file  . Transportation needs:    Medical: Not on file    Non-medical: Not on file  Tobacco Use  . Smoking status: Former Smoker    Packs/day: 0.50    Years: 35.00    Pack years: 17.50    Types: Cigarettes  . Smokeless tobacco: Never Used  Substance and Sexual Activity  . Alcohol use: No    Alcohol/week: 0.0 standard drinks  . Drug use: No  . Sexual activity: Never  Lifestyle  . Physical activity:    Days per week: Not on file    Minutes per session: Not on file  . Stress: Not on file  Relationships  . Social connections:    Talks on phone: Not on file    Gets together: Not on file    Attends religious service: Not on file    Active member of club  or organization: Not on file    Attends meetings of clubs or organizations: Not on file    Relationship status: Not on file  Other Topics Concern  . Not on file  Social History Narrative  . Not on file      Recent Hospitalizations? No  Health Risk Assessment: The patient has completed a Health Risk Assessment. This has been reveiwed with them and has been scanned into the [facility] system as an attached document.  Current Medical Providers and Suppliers: [facility]  Team: [name], MD as PCP - General (Family Medicine) Fanny Skates, MD as Consulting Physician (General Surgery) Nicholas Lose, MD as Consulting Physician (Hematology and Oncology) Collene Gobble, MD as Consulting Physician (Pulmonary Disease) No future appointments.   Age-appropriate Screening Schedule: The list below includes current immunization status and future screening recommendations based on patient's age. Orders for these recommended tests are listed in the plan section. The patient has been provided with a written plan. Immunization History  Administered Date(s) Administered  . Influenza Split 01/28/2012  . Influenza, High Dose Seasonal PF 02/25/2018  . Influenza,inj,Quad PF,6+ Mos 01/09/2013, 02/12/2014, 04/12/2015, 01/25/2016, 01/24/2017  . Pneumococcal Conjugate-13 03/09/2013  . Pneumococcal Polysaccharide-23 09/29/2014  . Tdap 12/26/2012  . Zoster 12/20/2014    Health Maintenance reviewed -   Reviewed health maintenance which is up to date   Depression Screen-PHQ2/9 completed today  Depression screen [facility] 2/9 05/20/2018 10/31/2017 10/08/2017 09/05/2017 07/25/2017  Decreased Interest 0 0 0 0 0  Down, Depressed, Hopeless 0 0 0 0 0  PHQ - 2 Score 0 0 0 0 0  Some recent data might be hidden       Depression Severity and Treatment Recommendations:  0-4= None  5-9= Mild / Treatment: Support, educate to call if worse; return in one month  10-14= Moderate / Treatment: Support, watchful  waiting; Antidepressant or Psycotherapy  15-19= Moderately severe / Treatment: Antidepressant OR Psychotherapy  >= 20 = Major depression, severe / Antidepressant AND Psychotherapy  Functional Status Survey:   Is the patient deaf or have difficulty hearing?: Yes(problems hearing consonants- hearing check this year) Does the patient have difficulty seeing, even when wearing glasses/contacts?: No Does the patient have difficulty concentrating, remembering, or making decisions?: No Does the patient have difficulty walking or climbing stairs?: Yes(left knee bother pt, right knee replacement, trouble climbing stairs and pt has to be careful with balance when standing due to deformation of toes, and so peripheral neuropathy) Does the patient have difficulty dressing or bathing?: No Does the patient have difficulty doing errands alone such as visiting a doctor's office or shopping?: No   Advanced Care Planning: 1. Patient has executed an Advance Directive: No 2. If no, patient was given the opportunity to execute an Advance Directive today? Yes 3. Are the patient's advanced directives in [place]? No 4. This patient has the ability to prepare an Advance Directive: Yes 5. Provider is willing to follow the patient's wishes: Yes  Cognitive Assessment: Does the patient have evidence of cognitive impairment? No The patient does not have any evidence of any cognitive problems and denies any  change in mood/affect, appearance, speech, memory or motor skills.  Identification of Risk Factors: Risk factors include: diabetes mellitus, hyperlipidemia, hypertension and smoking  ROS Review of Systems  Constitutional: Negative for activity change, appetite change, chills and fever.  HENT: Negative for congestion, nosebleeds, trouble swallowing and voice change.   Respiratory: Negative for cough, shortness of breath and wheezing.   Gastrointestinal: Negative for diarrhea, nausea and vomiting.  Genitourinary:  Negative for difficulty urinating, dysuria, flank pain and hematuria.  Musculoskeletal: +intermittent joint pains.  Neurological: Negative for dizziness, speech difficulty, light-headedness and numbness.  See HPI. All other review of systems negative.   Objective:   Vitals:   05/20/18 0927  BP: 138/82  Pulse: 73  Resp: 17  Temp: 98.6 F (37 C)  TempSrc: Oral  SpO2: 99%  Weight: 222 lb 3.2 oz (100.8 kg)  Height: 5' 3.75" (1.619 m)  Body mass index is 38.44 kg/m.  Physical Exam  Constitutional: Oriented to person, place, and time. Appears well-developed and well-nourished.  HENT:  Head: Normocephalic and atraumatic.  Eyes: Conjunctivae and EOM are normal.  Breast exam: bilateral mastectomy, no axillary LAD Cardiovascular: Normal rate, regular rhythm, normal heart sounds and intact distal pulses.  No murmur heard. Pulmonary/Chest: Effort normal and breath sounds normal. No stridor. No respiratory distress. Has no wheezes.  Neurological: Is alert and oriented to person, place, and time.  Skin: Skin is warm. Capillary refill takes less than 2 seconds.  Psychiatric: Has a normal mood and affect. Behavior is normal. Judgment and thought content normal.      Assessment/Plan:   Patient Self-Management and Personalized Health Advice The patient has been provided with information about:  continue current medications, continue current healthy lifestyle patterns and return for routine annual checkups  During the course of the visit the patient was educated and counseled about appropriate screening and preventive services including:   medication refills are given, return annually or prn     Body mass index is 38.44 kg/m. Discussed the patient's BMI with her. The BMI BMI is not in the acceptable range; BMI management plan is completed  Susan [name] was seen today for annual exam.  Diagnoses and all orders for this visit:  Welcome to Medicare preventive visit-  Labs done and  health maintenance up to date  Decreased hearing of both ears- will refer for assessment -     Ambulatory referral to ENT  Controlled type 2 diabetes mellitus with diabetic polyneuropathy, without long-term current use of insulin ([place])-  Reviewed diabetic foot care -     HM Diabetes Foot Exam  Fibromyalgia muscle pain- continue pain meds  -     DULoxetine (CYMBALTA) 30 MG capsule; Take 3 capsules (90 mg total) by mouth daily.  Essential hypertension- reviewed and bp appropriate -     metoprolol tartrate (LOPRESSOR) 50 MG tablet; Take 1 tablet (50 mg total) by mouth 2 (two) times daily.  Partial resection of thyroid and now with acquired hypothyroidism- stable on current dose- discussed her tsh trend, refilled medication today -     levothyroxine (SYNTHROID, LEVOTHROID) 50 MCG tablet; Take 1 tablet (50 mcg total) by mouth daily before breakfast.  Current smoker-  Plans to do patches to help with smoking cessation Offered support   Other orders -     Fluticasone-Salmeterol (ADVAIR DISKUS) 100-50 MCG/DOSE AEPB; TAKE 1 PUFF BY MOUTH TWICE A DAY -     esomeprazole (NEXIUM) 20 MG capsule; Take 1 capsule (20 mg total) by mouth daily at 12 noon. -     metFORMIN (GLUCOPHAGE) 1000 MG tablet; Take 1 tablet (1,000 mg total) by mouth 2 (two) times daily with a meal. -     pravastatin (PRAVACHOL) 40 MG tablet; Take 1 tablet (40 mg total) by mouth daily. No more refills without office visit -     tiZANidine (ZANAFLEX) 4 MG tablet; Take 1 tablet (4 mg total) by mouth at bedtime. -     ipratropium (ATROVENT) 0.03 % nasal spray; Place 2 sprays into the nose 4 (four) times daily. -     albuterol (PROVENTIL HFA;VENTOLIN HFA) 108 (90 Base) MCG/ACT inhaler; INHALE 2 PUFFS INTO THE LUNGS EVERY 4 HOURS AS NEEDED FOR WHEEZING OR SHORTNESS OF BREATH OR COUGH -     hydrochlorothiazide (HYDRODIURIL) 25 MG tablet; Take 1 tablet (25 mg total) by mouth daily. -     amitriptyline (  ELAVIL) 25 MG tablet; TAKE 1 TABLET  BY MOUTH EVERYDAY AT BEDTIME      No follow-ups on file.  No future appointments.  Patient Instructions       If you have lab work done today you will be contacted with your lab results within the next 2 weeks.  If you have not heard from [place] then please contact us. The fastest way to get your results is to register for My Chart.   IF you received an x-ray today, you will receive an invoice from [facility]. Please contact [facility] at [phone] with questions or concerns regarding your invoice.   IF you received labwork today, you will receive an invoice from [facility]. Please contact [facility] at [phone] with questions or concerns regarding your invoice.   Our billing staff will not be able to assist you with questions regarding bills from these companies.  You will be contacted with the lab results as soon as they are available. The fastest way to get your results is to activate your My Chart account. Instructions are located on the last page of this paperwork. If you have not heard from [place] regarding the results in 2 weeks, please contact this office.       An after visit summary with all of these plans was given to the patient.

## 2018-05-20 NOTE — Patient Instructions (Signed)
° ° ° °  If you have lab work done today you will be contacted with your lab results within the next 2 weeks.  If you have not heard from us then please contact us. The fastest way to get your results is to register for My Chart. ° ° °IF you received an x-ray today, you will receive an invoice from [facility]. Please contact [facility] at [phone] with questions or concerns regarding your invoice.  ° °IF you received labwork today, you will receive an invoice from [facility]. Please contact [facility] at [phone] with questions or concerns regarding your invoice.  ° °Our billing staff will not be able to assist you with questions regarding bills from these companies. ° °You will be contacted with the lab results as soon as they are available. The fastest way to get your results is to activate your My Chart account. Instructions are located on the last page of this paperwork. If you have not heard from us regarding the results in 2 weeks, please contact this office. °  ° ° ° °

## 2018-05-21 ENCOUNTER — Encounter: Payer: Self-pay | Admitting: Family Medicine

## 2018-06-02 ENCOUNTER — Encounter: Payer: Self-pay | Admitting: Family Medicine

## 2018-06-05 DIAGNOSIS — H903 Sensorineural hearing loss, bilateral: Secondary | ICD-10-CM | POA: Diagnosis not present

## 2018-06-11 DIAGNOSIS — H6123 Impacted cerumen, bilateral: Secondary | ICD-10-CM | POA: Diagnosis not present

## 2018-06-11 DIAGNOSIS — H903 Sensorineural hearing loss, bilateral: Secondary | ICD-10-CM | POA: Diagnosis not present

## 2018-06-17 ENCOUNTER — Encounter: Payer: Self-pay | Admitting: Family Medicine

## 2018-06-20 ENCOUNTER — Encounter: Payer: Self-pay | Admitting: Family Medicine

## 2018-06-23 ENCOUNTER — Encounter: Payer: Self-pay | Admitting: Family Medicine

## 2018-06-30 ENCOUNTER — Telehealth: Payer: Self-pay | Admitting: Family Medicine

## 2018-06-30 NOTE — Telephone Encounter (Signed)
Dr. Brigitte Pulse, Please read the below note from pt. She is worried about this and has not gotten a response from Dr. Nolon Rod. She has an appt with you on 3/18.   Thank you!

## 2018-06-30 NOTE — Telephone Encounter (Signed)
Called and spoke with pt regarding their appt scheduled on 3/31 with Dr. Brigitte Pulse. Due to Dr. Brigitte Pulse being out of the office, pt needed to be rescheduled. I was able to reschedule appt for 3/18 with Dr. Brigitte Pulse. I advised of time, and late policy. Pt acknowledged.

## 2018-07-07 ENCOUNTER — Telehealth: Payer: Self-pay | Admitting: Family Medicine

## 2018-07-07 NOTE — Telephone Encounter (Signed)
mychart message sent to pt about their appointment with dr Brigitte Pulse

## 2018-07-18 ENCOUNTER — Ambulatory Visit: Payer: Medicare Other | Admitting: Family Medicine

## 2018-07-23 ENCOUNTER — Ambulatory Visit: Payer: Medicare Other | Admitting: Family Medicine

## 2018-07-24 ENCOUNTER — Ambulatory Visit: Payer: Medicare Other | Admitting: Family Medicine

## 2018-07-31 ENCOUNTER — Ambulatory Visit: Payer: Medicare Other | Admitting: Family Medicine

## 2018-08-05 ENCOUNTER — Ambulatory Visit: Payer: Self-pay | Admitting: Family Medicine

## 2018-08-14 ENCOUNTER — Encounter: Payer: Self-pay | Admitting: Family Medicine

## 2018-08-26 ENCOUNTER — Ambulatory Visit: Payer: Self-pay | Admitting: *Deleted

## 2018-08-26 ENCOUNTER — Encounter (HOSPITAL_COMMUNITY): Payer: Self-pay

## 2018-08-26 ENCOUNTER — Emergency Department (HOSPITAL_COMMUNITY): Payer: Medicare Other

## 2018-08-26 ENCOUNTER — Other Ambulatory Visit: Payer: Self-pay

## 2018-08-26 ENCOUNTER — Emergency Department (HOSPITAL_COMMUNITY)
Admission: EM | Admit: 2018-08-26 | Discharge: 2018-08-26 | Disposition: A | Discharge status: Home / Self Care | Payer: Medicare Other | Attending: Emergency Medicine | Admitting: Emergency Medicine

## 2018-08-26 DIAGNOSIS — E039 Hypothyroidism, unspecified: Secondary | ICD-10-CM | POA: Diagnosis not present

## 2018-08-26 DIAGNOSIS — Z853 Personal history of malignant neoplasm of breast: Secondary | ICD-10-CM | POA: Diagnosis not present

## 2018-08-26 DIAGNOSIS — K1120 Sialoadenitis, unspecified: Secondary | ICD-10-CM | POA: Diagnosis not present

## 2018-08-26 DIAGNOSIS — F17210 Nicotine dependence, cigarettes, uncomplicated: Secondary | ICD-10-CM | POA: Insufficient documentation

## 2018-08-26 DIAGNOSIS — I10 Essential (primary) hypertension: Secondary | ICD-10-CM | POA: Insufficient documentation

## 2018-08-26 DIAGNOSIS — J449 Chronic obstructive pulmonary disease, unspecified: Secondary | ICD-10-CM | POA: Insufficient documentation

## 2018-08-26 DIAGNOSIS — E119 Type 2 diabetes mellitus without complications: Secondary | ICD-10-CM | POA: Insufficient documentation

## 2018-08-26 DIAGNOSIS — K111 Hypertrophy of salivary gland: Secondary | ICD-10-CM | POA: Diagnosis not present

## 2018-08-26 DIAGNOSIS — R221 Localized swelling, mass and lump, neck: Secondary | ICD-10-CM | POA: Diagnosis present

## 2018-08-26 LAB — CBC WITH DIFFERENTIAL/PLATELET
Abs Immature Granulocytes: 0.02 10*3/uL (ref 0.00–0.07)
Basophils Absolute: 0 10*3/uL (ref 0.0–0.1)
Basophils Relative: 1 %
Eosinophils Absolute: 0.2 10*3/uL (ref 0.0–0.5)
Eosinophils Relative: 3 %
HCT: 41.6 % (ref 36.0–46.0)
Hemoglobin: 14.3 g/dL (ref 12.0–15.0)
Immature Granulocytes: 0 %
Lymphocytes Relative: 44 %
Lymphs Abs: 3.5 10*3/uL (ref 0.7–4.0)
MCH: 29.5 pg (ref 26.0–34.0)
MCHC: 34.4 g/dL (ref 30.0–36.0)
MCV: 85.8 fL (ref 80.0–100.0)
Monocytes Absolute: 0.6 10*3/uL (ref 0.1–1.0)
Monocytes Relative: 8 %
Neutro Abs: 3.5 10*3/uL (ref 1.7–7.7)
Neutrophils Relative %: 44 %
Platelets: 268 10*3/uL (ref 150–400)
RBC: 4.85 MIL/uL (ref 3.87–5.11)
RDW: 12.3 % (ref 11.5–15.5)
WBC: 8 10*3/uL (ref 4.0–10.5)
nRBC: 0 % (ref 0.0–0.2)

## 2018-08-26 LAB — BASIC METABOLIC PANEL
Anion gap: 13 (ref 5–15)
BUN: 13 mg/dL (ref 8–23)
CO2: 26 mmol/L (ref 22–32)
Calcium: 9.7 mg/dL (ref 8.9–10.3)
Chloride: 101 mmol/L (ref 98–111)
Creatinine, Ser: 0.72 mg/dL (ref 0.44–1.00)
GFR calc Af Amer: >60 mL/min (ref >60)
GFR calc non Af Amer: >60 mL/min (ref >60)
Glucose, Bld: 87 mg/dL (ref 70–99)
Potassium: 3.6 mmol/L (ref 3.5–5.1)
Sodium: 140 mmol/L (ref 135–145)

## 2018-08-26 MED ORDER — IOHEXOL 300 MG/ML  SOLN
75.0000 mL | Freq: Once | INTRAMUSCULAR | Status: AC | PRN
  Administered 2018-08-26: 75 mL via INTRAVENOUS

## 2018-08-26 MED ORDER — SODIUM CHLORIDE (PF) 0.9 % IJ SOLN
INTRAMUSCULAR | Status: AC
  Filled 2018-08-26: qty 50

## 2018-08-26 MED ORDER — DOXYCYCLINE HYCLATE 100 MG PO CAPS: 100.0000 mg | ORAL_CAPSULE | Freq: Two times a day (BID) | ORAL | 0 refills | Status: DC

## 2018-08-26 NOTE — ED Notes (Signed)
Bed: WA01 Expected date: 08/24/18 Expected time: 7:54 PM Means of arrival:  Comments:

## 2018-08-26 NOTE — ED Triage Notes (Signed)
Patient c/o left neck swelling and tender to touch. Patient denies any difficulty swallowing, but states she can feel it when she does swallow. Patient reports a history of a thyroid nodule on the right side.

## 2018-08-26 NOTE — Telephone Encounter (Signed)
Patient is asking that triage nurse to call her back regarding the lump in her neck

## 2018-08-26 NOTE — Discharge Instructions (Signed)
You were seen in the emergency department with swelling of your neck.  This is an inflamed salivary gland.  This may be from either a viral or bacterial infection.  I am starting you on an antibiotic.  You can apply a warm compress to the area and take Tylenol and/or Motrin as needed for pain symptoms.  Call your PCP and the ENT provided for outpatient follow-up in the coming 1 to 2 weeks.   Return to the emergency department with any trouble breathing, difficulty swallowing, changes in speech, fever, or other suddenly worsening symptoms.

## 2018-08-26 NOTE — ED Provider Notes (Signed)
Emergency Department Provider Note   I have reviewed the triage vital signs and the nursing notes.   HISTORY  Chief Complaint neck swelling   HPI Susan [name] is a 68 y.o. female with PMH of COPD, DDD, DM, HLD, and latent TB presents to the emergency department for evaluation of tender left neck swelling without difficulty swallowing.  She denies any voice changes.  She does feel the area getting larger throughout the day today.  She first noticed it this morning.  She notes history of thyroid nodule but no swelling in this area previously.  She was in contact with her PCP who referred her to the emergency department.  She denies any fevers or chills. No CP or SOB.   Past Medical History:  Diagnosis Date  . Anemia 01/28/2012  . Anxiety   . Cancer [facility]) 2004   ductal carcinoma; lumpectomy  . Cellulitis of chest wall 12/09/2015  . Complication of anesthesia    difficulty voiding after anesthesia  . COPD (chronic obstructive pulmonary disease) (HCC)    mild  . DDD (degenerative disc disease)   . Depression   . Diabetes mellitus without complication ([place])   . DJD (degenerative joint disease)   . Dysrhythmia    hx palpitations?panic attack  . Fatty liver disease, nonalcoholic   . Fibromyalgia   . GERD (gastroesophageal reflux disease)   . Hip pain, right 10/12/2013  . History of blood clots 2004   during cancer treatment  . HTN (hypertension) 01/28/2012  . Hyperglycemia 01/28/2012  . Hyperlipidemia 01/28/2012  . Hypertension   . Hypothyroidism 02/14/2014  . Muscle spasm 12/30/2011  . Nasal polyp 08/30/2012  . Obesity   . Osteoarthritis   . Osteopenia   . Pain of left heel 01/28/2012  . Peripheral neuropathy   . Peripheral vascular disease ([place]) 04   axillary,upper chest after 3 weeks on tamoxifen  . Pneumonia    hx  . Preventative health care 01/28/2012  . Shortness of breath dyspnea    occ on exersion  . Sleep apnea    on CPAP  . Tobacco abuse-unspec 10/12/2013  .  Tuberculosis    latent  finishing 9 months tx on last month    Patient Active Problem List   Diagnosis Date Noted  . Induration, breast   . Left shoulder pain   . Cancer of central portion of right breast ([place]) 10/26/2015  . Genetic testing 10/17/2015  . History of breast cancer in female 09/15/2015  . Breast cancer of upper-outer quadrant of right female breast ([place]) 09/15/2015  . Family history of breast cancer in female 09/15/2015  . Family history of colon cancer 09/15/2015  . TB lung, latent 01/11/2015  . COPD (chronic obstructive pulmonary disease) ([place]) 10/07/2014  . Mediastinal lymphadenopathy 10/07/2014  . Diabetes mellitus type 2, controlled ([place]) 09/29/2014  . Pulmonary nodule 09/29/2014  . History of chest pain 09/29/2014  . Essential hypertension 09/29/2014  . Chest pain 09/23/2014  . Hypothyroidism 02/14/2014  . Hip pain, right 10/12/2013  . Cervical cancer screening 03/09/2013  . Seasonal allergies 09/12/2012  . Nasal polyp 08/30/2012  . SOM (serous otitis media) 08/30/2012  . Peripheral neuropathy 07/19/2012  . Cervical myofascial pain syndrome 06/08/2012  . Anemia 01/28/2012  . Hyperlipidemia 01/28/2012  . Hyperglycemia 01/28/2012  . Pain of left heel 01/28/2012  . Neck pain 12/30/2011  . Muscle spasm 12/30/2011  . Anxiety and depression   . Osteoarthritis   . GERD (gastroesophageal reflux  disease)   . Fibromyalgia   . Sleep apnea   . Obesity   . History of blood clots   . Fatty liver disease, nonalcoholic   . DDD (degenerative disc disease)     Past Surgical History:  Procedure Laterality Date  . ABDOMINAL HYSTERECTOMY  1991  . APPENDECTOMY  91  . BILATERAL OOPHORECTOMY  01/2003  . BREAST SURGERY Left 2004   lumpectomy  . CARPAL TUNNEL RELEASE     right  . INCISION AND DRAINAGE ABSCESS Left 12/13/2015   Procedure: DRAINAGE LEFT MASTECTOMY WOUND INFECTION;  Surgeon: Fanny Skates, MD;  Location: [facility] [facility];  Service: General;  Laterality: Left;  .  JOINT REPLACEMENT     right total knee  . MASTECTOMY W/ SENTINEL NODE BIOPSY Right 10/26/2015  . MASTECTOMY W/ SENTINEL NODE BIOPSY Bilateral 10/26/2015   Procedure: RIGHT TOTAL MASTECTOMY WITH RIGHT SENTINEL LYMPH NODE BIOPSY, INJECT BLUE DYE RIGHT BREAST, LEFT BREAST PROPHYLACTIC MASTECTOMY;  Surgeon: Fanny Skates, MD;  Location: [place];  Service: General;  Laterality: Bilateral;  . NASAL SEPTUM SURGERY  08  . PILONIDAL CYST EXCISION    . THYROIDECTOMY, PARTIAL  mid 80's   right side    Allergies Chantix [varenicline]; Ciprofloxacin; Penicillins; Tamoxifen; Levaquin [levofloxacin]; Lyrica [pregabalin]; Neurontin [gabapentin]; Morphine and related; Ceclor [cefaclor]; Other; and Sulfa antibiotics  Family History  Problem Relation Age of Onset  . Cancer Mother 40       liver cancer, hep c  . Heart disease Mother        chf  . Hypertension Mother   . Hyperlipidemia Mother   . Osteoporosis Mother   . Cancer Father 20       lung; smoker  . COPD Father   . Heart disease Sister        mvp  . Breast cancer Sister        dx. 17s  . Non-Hodgkin's lymphoma Sister 33       large B cell  . Hypertension Brother   . Arthritis Brother   . Kidney disease Brother   . Prostate cancer Brother        dx. early 58s  . Other Daughter        Beal's Syndrome  . Arthritis Daughter        Beal's  . Arthritis Son        Beal's connective tissue disease  . Vision loss Maternal Grandmother   . Dementia Maternal Grandmother   . Coronary artery disease Maternal Grandmother   . Heart Problems Maternal Grandmother   . Vision loss Paternal Grandfather   . Hypertension Brother   . Benign prostatic hyperplasia Brother   . Leukemia Brother        chronic lymphatic leukemia - small/B cell  . Hypertension Brother   . COPD Brother   . Prostate cancer Brother        dx. mid-60s  . Myelodysplastic syndrome Brother        dx. early 9s  . Hypertension Brother   . Hyperlipidemia Brother   . Prostate  cancer Brother        dx. mid-70s  . Melanoma Brother        (x2) melanomas dx. late 60s-early 54s  . Mental illness Brother        schizophrenia  . Breast cancer Brother        dx. 97s  . Cirrhosis Brother        hep c  . Hypertension Sister   .  Other Sister        thalessemia, anemia; hx of hysterectomy in her 58s  . Hyperlipidemia Sister   . Gout Sister   . Diverticulitis Sister   . Other Daughter        overweight  . Breast cancer Maternal Aunt 71  . Lung cancer Cousin        maternal 1st cousin dx. 64 or younger; former smoker  . Colon cancer Cousin        maternal 1st cousin dx. late 74s-early 60s  . Cancer Cousin        maternal 1st cousin d. NOS cancer  . Emphysema Paternal Aunt   . Lung cancer Paternal Aunt        d. early 55s; smoker  . Leukemia Cousin        paternal 1st cousin d. early 52s  . Colon cancer Other 29       niece  . Melanoma Other        niece    Social History Social History   Tobacco Use  . Smoking status: Current Every Day Smoker    Packs/day: 0.50    Years: 35.00    Pack years: 17.50    Types: Cigarettes  . Smokeless tobacco: Never Used  Substance Use Topics  . Alcohol use: No    Alcohol/week: 0.0 standard drinks  . Drug use: No    Review of Systems  Constitutional: No fever/chills Eyes: No visual changes. ENT: No sore throat. Left neck pain with pain and swelling.  Cardiovascular: Denies chest pain. Respiratory: Denies shortness of breath. Gastrointestinal: No abdominal pain.  No nausea, no vomiting.  No diarrhea.  No constipation. Genitourinary: Negative for dysuria. Musculoskeletal: Negative for back pain. Skin: Negative for rash. Neurological: Negative for headaches.  10-point ROS otherwise negative.  ____________________________________________   PHYSICAL EXAM:  VITAL SIGNS: ED Triage Vitals  Enc Vitals Group     BP 08/26/18 1445 (!) 168/85     Pulse Rate 08/26/18 1445 82     Resp 08/26/18 1445 18     Temp  08/26/18 1445 98.5 F (36.9 C)     Temp Source 08/26/18 1445 Oral     SpO2 08/26/18 1445 100 %     Weight 08/26/18 1446 212 lb (96.2 kg)     Height 08/26/18 1446 5\' 4"  (1.626 m)   Constitutional: Alert and oriented. Well appearing and in no acute distress. Eyes: Conjunctivae are normal.  Head: Atraumatic. Nose: No congestion/rhinnorhea. Mouth/Throat: Mucous membranes are moist.  Oropharynx with left submandibular tenderness and swelling. Mildly tender to palpation. No cellulitis. No fluctuance. No oral edema. Normal speech.  Neck: No stridor.   Cardiovascular: Normal rate, regular rhythm.  Respiratory: Normal respiratory effort.   Gastrointestinal: No distention.  Musculoskeletal: No gross deformities of extremities. Neurologic:  Normal speech and language.  Skin:  Skin is warm, dry and intact. No rash noted.   ____________________________________________   LABS (all labs ordered are listed, but only abnormal results are displayed)  Labs Reviewed  BASIC METABOLIC PANEL  CBC WITH DIFFERENTIAL/PLATELET   ____________________________________________  RADIOLOGY  Ct Soft Tissue Neck W Contrast  Result Date: 08/26/2018 CLINICAL DATA:  Left submandibular swelling rule out stone. EXAM: CT NECK WITH CONTRAST TECHNIQUE: Multidetector CT imaging of the neck was performed using the standard protocol following the bolus administration of intravenous contrast. CONTRAST:  52mL OMNIPAQUE IOHEXOL 300 MG/ML  SOLN COMPARISON:  None. FINDINGS: Pharynx and larynx: Normal. No mass or swelling.  Salivary glands: Mild enlargement of the left submandibular gland which shows diffuse hyperenhancement. There is edema in the surrounding soft tissue and platysmas muscle. No mass or stone identified. Right submandibular gland normal.  Parotid normal bilaterally. Thyroid: Right thyroidectomy.  Normal left thyroid. Lymph nodes: No enlarged or pathologic lymph nodes. Left level 5 node 7.3 mm. Right level 2 lymph  node 9.4 mm. Left level 2 node 9.4 mm. Vascular: Normal vascular enhancement. Limited intracranial: Negative Visualized orbits: Negative Mastoids and visualized paranasal sinuses: Negative Skeleton: Cervical spondylosis without acute skeletal abnormality. Upper chest: Negative Other: None IMPRESSION: Enlarged inflamed left submandibular gland without mass or stone. Findings compatible with sialoadenitis which could be a viral or bacterial infection. No salivary duct calculi. Right thyroidectomy.  Normal left thyroid No enlarged lymph nodes in the neck. Electronically Signed   By: Franchot Gallo M.D.   On: 08/26/2018 17:43    ____________________________________________   PROCEDURES  Procedure(s) performed:   Procedures  None  ____________________________________________   INITIAL IMPRESSION / ASSESSMENT AND PLAN / ED COURSE  Pertinent labs & imaging results that were available during my care of the patient were reviewed by me and considered in my medical decision making (see chart for details).   Patient presents to the emergency department for evaluation of mildly tender area of the left submandibular area.  No cellulitis or fluctuance.  Patient with clear voice and managing oral secretions.  She does feel that the mass is rapidly increasing in size today.  No suspicion on exam for vascular pathology.  Plan for CT imaging of the neck.  Possible salivary gland stone.  Lower suspicion for abscess.  06:00 PM  CT imaging reviewed.  Patient with sialoadenitis.  No stone visualized on CT.  No abscess.  Lower suspicion for viral etiology but will cover with doxycycline as the patient has allergy to penicillins and cephalosporins.  Provided contact information for ENT to schedule a follow-up appointment if symptoms do not resolve.  Discussed ED return precautions in detail. ____________________________________________  FINAL CLINICAL IMPRESSION(S) / ED DIAGNOSES  Final diagnoses:  Sialoadenitis      MEDICATIONS GIVEN DURING THIS VISIT:  Medications  sodium chloride (PF) 0.9 % injection (has no administration in time range)  iohexol (OMNIPAQUE) 300 MG/ML solution 75 mL (75 mLs Intravenous Contrast Given 08/26/18 1719)     NEW OUTPATIENT MEDICATIONS STARTED DURING THIS VISIT:  New Prescriptions   DOXYCYCLINE (VIBRAMYCIN) 100 MG CAPSULE    Take 1 capsule (100 mg total) by mouth 2 (two) times daily for 7 days.    Note:  This document was prepared using Dragon voice recognition software and may include unintentional dictation errors.  Nanda Quinton, MD Emergency Medicine    , [facility], MD 08/26/18 [phone]

## 2018-08-26 NOTE — Telephone Encounter (Signed)
Patient states she feels the lump has gotten larger since she noticed it and she feels a sensation in her throat. Because she is feeling the sensation in her throat- concern is for her airway and she is encouraged to go to ED now- her husband is going to take her.  Reason for Disposition . Patient sounds very sick or weak to the triager    Although patient is able to speak and she is calm at this time- the cause of swelling in neck is unknown and she states she feels the sensation on the inside. She is still able to swallow- but because the swelling has increased in size so quickly- advised ED for evaluation.  Answer Assessment - Initial Assessment Questions 1. APPEARANCE of SWELLING: "What does it look like?" (e.g., lymph node, insect bite, mole)     swollen area left neck area 2. SIZE: "How large is the swelling?" (inches, cm or compare to coins)     Small egg size 3. LOCATION: "Where is the swelling located?"     Left- visible- ? L side thyroid location 4. ONSET: "When did the swelling start?"     1 hour ago discovered- has gotten larger since discovered 5. PAIN: "Is it painful?" If so, ask: "How much?"     Tender to touch 6. ITCH: "Does it itch?" If so, ask: "How much?"     No itching 7. CAUSE: "What do you think caused the swelling?"     Unknown cause- appearance of swelling very quick-within 1-2 hours 8. OTHER SYMPTOMS: "Do you have any other symptoms?" (e.g., fever)     Can feel sensation in throat  Protocols used: SKIN LUMP OR LOCALIZED SWELLING-A-AH

## 2018-08-27 ENCOUNTER — Ambulatory Visit: Payer: Medicare Other | Admitting: Family Medicine

## 2018-08-27 ENCOUNTER — Encounter: Payer: Self-pay | Admitting: Family Medicine

## 2018-08-27 DIAGNOSIS — K1120 Sialoadenitis, unspecified: Secondary | ICD-10-CM

## 2018-08-27 DIAGNOSIS — K1121 Acute sialoadenitis: Secondary | ICD-10-CM | POA: Diagnosis not present

## 2018-08-27 HISTORY — DX: Sialoadenitis, unspecified: K11.20

## 2018-08-27 NOTE — Telephone Encounter (Signed)
Noted. Pt is on schedule for 08/29/2018 now. Signed:  Crissie Sickles, MD           08/27/2018

## 2018-08-28 ENCOUNTER — Telehealth: Payer: Self-pay

## 2018-08-28 NOTE — Progress Notes (Signed)
Virtual Visit via Video Note  I connected with pt  on 08/29/18 at 10:00 AM EDT by a video enabled telemedicine application and verified that I am speaking with the correct person using two identifiers.  This had to be converted to telephone visit after technical difficulties.  Location patient: home Location provider:work or home office Persons participating in the virtual visit: patient, provider  I discussed the limitations of evaluation and management by telemedicine and the availability of in person appointments. The patient expressed understanding and agreed to proceed.   HPI: 68 y/o WF who I am seeing today to establish care.  Previous PCP, Dr. Maeola Harman left her group practice and pt had been with our office in the remote past with Dr. Charlett Blake so pt just wanted to return here. She has many chronic medical problems and complicated PMH, outlined below in [place] and PSH sections.  Patient states that things are going well and she currently is feeling better than she has in a long time. Recent partial submandib duct obstruction is resolving well with increased fluid intake.  She stopped her abx after 2d as per her ENT's recommendation.  HTN: not monitoring bp at home but says she is going to start. She says she knows her bp has been "trending up" but no specifics given.  DM 2: she mentioned a recent fasting glucose of 85.  She has been losing wt since going on metformin 1000 mg bid and is very pleased with this.  Fibromyalgia: taking cymbalta 90 mg qd--> Has been on cymbalta for many years for this and for tx of peripheral neuropathy. It has been helpful for DDD pain in spine as well. It has also been helpful for her chronic anxiety and depression. She has been doing very well on this med at the 90 mg dosing for approx 1-2 yrs, takes 3 of the 30 mg caps qAM. In the past when she has missed a dose or lowered dose she says her head starts buzzing like crazy and she cannot function. She has  tried lyrica in the past and it caused intolerable swelling.  Hx of mediastinal LAD: see pmh section below. We discussed this issue today and she is agreeable to repeat of the CT scan, which we'll plan on doing when the covid 19 restrictions have been stopped.    Past Medical History:  Diagnosis Date  . Anxiety and depression   . Atypical chest pain 2016   ruled out for MI, stress test showed no ischemia.  CT angio NO PE.  . Cellulitis of chest wall 12/09/2015   s/p breast surgery  . Colon cancer screening 05/11/2018   2007 colonoscopy normal.  Cologuard neg 05/11/18; repeat 3 yrs.  Marland Kitchen COPD (chronic obstructive pulmonary disease) (HCC)    mild; prn albut per Dr. Lamonte Sakai as of 2017--no f/u w/pulm since then.  . DDD (degenerative disc disease)   . Diabetes mellitus without complication ([place])   . DJD (degenerative joint disease)   . Fatty liver disease, nonalcoholic   . Fibromyalgia    cymbalta  . GERD (gastroesophageal reflux disease)   . History of blood clots 2004   during cancer treatment  . History of breast cancer 2017   DCIS->bilat mastectomy (Sister had breast cancer in her 69s). Last seen by onc not long after surgery, no further tx or onc f/u needed.  Marland Kitchen History of latent tuberculosis    got approp tx x 9 mo  . History of palpitations 2016  sinus tachycardia.  30 day event monitor was normal (Dr. Irish Lack)  . History of pneumonia   . HTN (hypertension) 01/28/2012  . Hyperlipidemia 01/28/2012  . Hypothyroidism 02/14/2014  . Lipoma 01/05/2018  . Mediastinal adenopathy 2016   on CT angio done for atypical CP.  Has FH of NHL in sister. Pulm saw her 01/2016 and recommended a f/u CT 09/2016 but this doesn't appear to have been done.  If no change, then pt is candidate for LDCT lung ca screening.  . Nasal polyp 08/30/2012  . Obesity   . OSA on CPAP   . Osteoarthritis   . Osteopenia   . Peripheral neuropathy   . Peripheral vascular disease ([place]) 04   axillary,upper chest after 3  weeks on tamoxifen  . Sialoadenitis of submandibular gland 08/27/2018   LEFT. doxy rx'd by ED. ENT f/u the next day->reassured, dx'd with partial left submandibular gland obstruction w/out sign of infxn; f/u prn.  . Tobacco abuse-unspec 10/12/2013    Past Surgical History:  Procedure Laterality Date  . ABDOMINAL HYSTERECTOMY  1991  . APPENDECTOMY  91  . BILATERAL OOPHORECTOMY  01/2003  . BREAST SURGERY Left 2004   lumpectomy  . CARPAL TUNNEL RELEASE     right  . COLONOSCOPY  10/15/2005   Dr. Bronson Ing, poor prep  . INCISION AND DRAINAGE ABSCESS Left 12/13/2015   Procedure: DRAINAGE LEFT MASTECTOMY WOUND INFECTION;  Surgeon: Fanny Skates, MD;  Location: [facility];  Service: General;  Laterality: Left;  Marland Kitchen MASTECTOMY W/ SENTINEL NODE BIOPSY Bilateral 10/26/2015   Procedure: RIGHT TOTAL MASTECTOMY WITH RIGHT SENTINEL LYMPH NODE BIOPSY, INJECT BLUE DYE RIGHT BREAST, LEFT BREAST PROPHYLACTIC MASTECTOMY;  Surgeon: Fanny Skates, MD;  Location: [facility];  Service: General;  Laterality: Bilateral;  . NASAL SEPTUM SURGERY  08  . PILONIDAL CYST EXCISION    . THYROIDECTOMY, PARTIAL  mid 80's   right side  . TOTAL KNEE ARTHROPLASTY Right   . TRANSTHORACIC ECHOCARDIOGRAM  05/25/2008   ? mild increase in LV filling pressure, EF 65%, normal LV function/wall motion, no LVH or ventricular enlargement.    Family History  Problem Relation Age of Onset  . Cancer Mother 81       liver cancer, hep c  . Heart disease Mother        chf  . Hypertension Mother   . Hyperlipidemia Mother   . Osteoporosis Mother   . Cancer Father 40       lung; smoker  . COPD Father   . Heart disease Sister        mvp  . Breast cancer Sister        dx. 64s  . Non-Hodgkin's lymphoma Sister 19       large B cell  . Hypertension Brother   . Arthritis Brother   . Kidney disease Brother   . Prostate cancer Brother        dx. early 42s  . Other Daughter        Beal's Syndrome  . Arthritis Daughter        Beal's  .  Arthritis Son        Beal's connective tissue disease  . Vision loss Maternal Grandmother   . Dementia Maternal Grandmother   . Coronary artery disease Maternal Grandmother   . Heart Problems Maternal Grandmother   . Vision loss Paternal Grandfather   . Hypertension Brother   . Benign prostatic hyperplasia Brother   . Leukemia Brother  chronic lymphatic leukemia - small/B cell  . Hypertension Brother   . COPD Brother   . Prostate cancer Brother        dx. mid-60s  . Myelodysplastic syndrome Brother        dx. early 48s  . Hypertension Brother   . Hyperlipidemia Brother   . Prostate cancer Brother        dx. mid-70s  . Melanoma Brother        (x2) melanomas dx. late 60s-early 58s  . Mental illness Brother        schizophrenia  . Breast cancer Brother        dx. 7s  . Cirrhosis Brother        hep c  . Hypertension Sister   . Other Sister        thalessemia, anemia; hx of hysterectomy in her 58s  . Hyperlipidemia Sister   . Gout Sister   . Diverticulitis Sister   . Other Daughter        overweight  . Breast cancer Maternal Aunt 57  . Lung cancer Cousin        maternal 1st cousin dx. 97 or younger; former smoker  . Colon cancer Cousin        maternal 1st cousin dx. late 48s-early 60s  . Cancer Cousin        maternal 1st cousin d. NOS cancer  . Emphysema Paternal Aunt   . Lung cancer Paternal Aunt        d. early 49s; smoker  . Leukemia Cousin        paternal 1st cousin d. early 43s  . Colon cancer Other 84       niece  . Melanoma Other        niece    Social History   Socioeconomic History  . Marital status: Married    Spouse name: Not on file  . Number of children: Not on file  . Years of education: Not on file  . Highest education level: Not on file  Occupational History  . Not on file  Social Needs  . Financial resource strain: Not on file  . Food insecurity:    Worry: Not on file    Inability: Not on file  . Transportation needs:     Medical: Not on file    Non-medical: Not on file  Tobacco Use  . Smoking status: Current Every Day Smoker    Packs/day: 0.50    Years: 35.00    Pack years: 17.50    Types: Cigarettes  . Smokeless tobacco: Never Used  Substance and Sexual Activity  . Alcohol use: No    Alcohol/week: 0.0 standard drinks  . Drug use: No  . Sexual activity: Never  Lifestyle  . Physical activity:    Days per week: Not on file    Minutes per session: Not on file  . Stress: Not on file  Relationships  . Social connections:    Talks on phone: Not on file    Gets together: Not on file    Attends religious service: Not on file    Active member of club or organization: Not on file    Attends meetings of clubs or organizations: Not on file    Relationship status: Not on file  Other Topics Concern  . Not on file  Social History Narrative   Married, 3 children.  Two have Beal's syndrome (connective tissue d/o similar to Marfan's).   Orig from  [place].  [place] since 82s.   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   Tobacco (current as of 08/2018).   No alcohol.   ROS: ROS: no CP, no SOB, no wheezing, no cough, no dizziness, no HAs, no rashes, no melena/hematochezia.  No polyuria or polydipsia.  No myalgias or arthralgias.   Current Outpatient Medications:  .  amitriptyline (ELAVIL) 25 MG tablet, TAKE 1 TABLET BY MOUTH EVERYDAY AT BEDTIME (Patient taking differently: Take 25 mg by mouth at bedtime. TAKE 1 TABLET BY MOUTH EVERYDAY AT BEDTIME), Disp: 90 tablet, Rfl: 1 .  cholecalciferol (VITAMIN D) 1000 UNITS tablet, Take 1,000 Units by mouth daily., Disp: , Rfl:  .  DULoxetine (CYMBALTA) 30 MG capsule, Take 3 capsules (90 mg total) by mouth daily., Disp: 270 capsule, Rfl: 1 .  DULoxetine (CYMBALTA) 60 MG capsule, Take 60 mg by mouth daily., Disp: , Rfl:  .  esomeprazole (NEXIUM) 20 MG capsule, Take 1 capsule (20 mg total) by mouth daily at 12  noon., Disp: 90 capsule, Rfl: 3 .  hydrochlorothiazide (HYDRODIURIL) 25 MG tablet, Take 1 tablet (25 mg total) by mouth daily., Disp: 90 tablet, Rfl: 1 .  ibuprofen (ADVIL,MOTRIN) 200 MG tablet, Take 800 mg by mouth every 6 (six) hours as needed for headache or mild pain. , Disp: , Rfl:  .  levothyroxine (SYNTHROID, LEVOTHROID) 50 MCG tablet, Take 1 tablet (50 mcg total) by mouth daily before breakfast., Disp: 90 tablet, Rfl: 1 .  metFORMIN (GLUCOPHAGE) 1000 MG tablet, Take 1 tablet (1,000 mg total) by mouth 2 (two) times daily with a meal., Disp: 180 tablet, Rfl: 1 .  metoprolol tartrate (LOPRESSOR) 50 MG tablet, Take 1 tablet (50 mg total) by mouth 2 (two) times daily., Disp: 180 tablet, Rfl: 1 .  Multiple Vitamins-Minerals (MULTIVITAMIN WITH MINERALS) tablet, Take 1 tablet by mouth daily., Disp: , Rfl:  .  pravastatin (PRAVACHOL) 40 MG tablet, Take 1 tablet (40 mg total) by mouth daily. No more refills without office visit, Disp: 90 tablet, Rfl: 3 .  tiZANidine (ZANAFLEX) 4 MG tablet, Take 1 tablet (4 mg total) by mouth at bedtime., Disp: 90 tablet, Rfl: 1 .  albuterol (PROVENTIL HFA;VENTOLIN HFA) 108 (90 Base) MCG/ACT inhaler, INHALE 2 PUFFS INTO THE LUNGS EVERY 4 HOURS AS NEEDED FOR WHEEZING OR SHORTNESS OF BREATH OR COUGH (Patient not taking: Reported on 08/29/2018), Disp: 48 Inhaler, Rfl: 4 .  albuterol (PROVENTIL) (2.5 MG/3ML) 0.083% nebulizer solution, Take 3 mLs (2.5 mg total) by nebulization every 6 (six) hours as needed for wheezing or shortness of breath. (Patient not taking: Reported on 08/29/2018), Disp: 150 mL, Rfl: 1 .  Fluticasone-Salmeterol (ADVAIR DISKUS) 100-50 MCG/DOSE AEPB, TAKE 1 PUFF BY MOUTH TWICE A DAY (Patient not taking: Reported on 08/29/2018), Disp: 180 each, Rfl: 1 .  ipratropium (ATROVENT) 0.03 % nasal spray, Place 2 sprays into the nose 4 (four) times daily. (Patient not taking: Reported on 08/29/2018), Disp: 30 mL, Rfl: 1  Allergies:  Chantix [varenicline];  Ciprofloxacin; Penicillins; Tamoxifen; Levaquin [levofloxacin]; Lyrica [pregabalin]; Neurontin [gabapentin]; Morphine and related; Ceclor [cefaclor]; Other; and Sulfa antibiotics  EXAM:   VITALS per patient if applicable: BP (!) 941/74 (BP Location: Left Arm, Patient Position: Sitting, Cuff Size: Large)   Pulse 82   Temp 99.1 F (37.3 C) (Oral)   Wt 213 lb 6.4 oz (96.8 kg)   BMI 36.63 kg/m    GENERAL: alert, oriented, appears well and  in no acute distress  HEENT: atraumatic, conjunttiva clear, no obvious abnormalities on inspection of external nose and ears  NECK: normal movements of the head and neck  LUNGS: on inspection no signs of respiratory distress, breathing rate appears normal, no obvious gross SOB, gasping or wheezing  CV: no obvious cyanosis  MS: moves all visible extremities without noticeable abnormality  PSYCH/NEURO: pleasant and cooperative, no obvious depression or anxiety, speech and thought processing grossly intact  LABS: none today.    Chemistry      Component Value Date/Time   NA 140 08/26/2018 1531   NA 141 04/09/2018 1545   NA 141 09/15/2015 1035   K 3.6 08/26/2018 1531   K 4.0 09/15/2015 1035   CL 101 08/26/2018 1531   CO2 26 08/26/2018 1531   CO2 30 (H) 09/15/2015 1035   BUN 13 08/26/2018 1531   BUN 12 04/09/2018 1545   BUN 15.5 09/15/2015 1035   CREATININE 0.72 08/26/2018 1531   CREATININE 0.91 01/25/2016 1011   CREATININE 0.9 09/15/2015 1035      Component Value Date/Time   CALCIUM 9.7 08/26/2018 1531   CALCIUM 9.8 09/15/2015 1035   ALKPHOS 92 04/09/2018 1545   ALKPHOS 60 09/15/2015 1035   AST 26 04/09/2018 1545   AST 35 (H) 09/15/2015 1035   ALT 46 (H) 04/09/2018 1545   ALT 53 09/15/2015 1035   BILITOT 0.3 04/09/2018 1545   BILITOT 0.31 09/15/2015 1035     Lab Results  Component Value Date   WBC 8.0 08/26/2018   HGB 14.3 08/26/2018   HCT 41.6 08/26/2018   MCV 85.8 08/26/2018   PLT 268 08/26/2018   Lab Results  Component  Value Date   CHOL 115 09/03/2017   HDL 31 (L) 09/03/2017   LDLCALC 45 09/03/2017   TRIG 196 (H) 09/03/2017   CHOLHDL 3.7 09/03/2017   Lab Results  Component Value Date   TSH 1.050 04/09/2018   Lab Results  Component Value Date   HGBA1C 6.2 (H) 04/09/2018    ASSESSMENT AND PLAN:  Discussed the following assessment and plan:  Transfer patient/establish care: 1) HTN, uncontrolled:  increase lopressor 50mg  to 1.5 tabs bid, and if bp not at goal of 130/80 in 1 wk, increase to TWO tabs bid.  Discussed monitoring of HR, as this could limit our ability to safely increase dosing of this med.  Continue hctz 25mg  qd.  2) DM 2: good control. Will check HbA1c -lab visit in about 2 wks. Continue metformin 1000 mg bid, esp since this seems to have helped her lose wt.  3) Hypercholesterolemia: tolerating pravastatin 40mg  qd. FLP -future 2 wks.  4) Fibromyalgia, peripheral neuropathy, chronic musculoskeletal pain, and anx/dep: all of these problems are very effectively treated by the cymbalta at 90 mg qd dosing.  We will do our best to get insurer to cover this for her.  5) Mediastinal lymphadenopathy: will repeat CT chest when covid restrictions are lifted somewhat.  6) Submandibular gland duct obstruction, partial.  No infection. This is resolving nicely with increased hydration and sialogogues.   I discussed the assessment and treatment plan with the patient. The patient was provided an opportunity to ask questions and all were answered. The patient agreed with the plan and demonstrated an understanding of the instructions.   The patient was advised to call back or seek an in-person evaluation if the symptoms worsen or if the condition fails to improve as anticipated.  F/u: 2 wks  Signed:  Crissie Sickles, MD           08/29/2018

## 2018-08-28 NOTE — Telephone Encounter (Signed)
Received NPP, updated pt's chart info and given to provider to review for tomorrow, 4/24. Also asking for appeal decision for Cymbalta.

## 2018-08-28 NOTE — Telephone Encounter (Signed)
Noted  

## 2018-08-29 ENCOUNTER — Other Ambulatory Visit: Payer: Self-pay

## 2018-08-29 ENCOUNTER — Ambulatory Visit (INDEPENDENT_AMBULATORY_CARE_PROVIDER_SITE_OTHER): Payer: Medicare Other | Admitting: Family Medicine

## 2018-08-29 ENCOUNTER — Encounter: Payer: Self-pay | Admitting: Family Medicine

## 2018-08-29 ENCOUNTER — Ambulatory Visit: Payer: Medicare Other | Admitting: Family Medicine

## 2018-08-29 VITALS — BP 183/84 | HR 82 | Temp 99.1°F | Wt 213.4 lb

## 2018-08-29 DIAGNOSIS — I10 Essential (primary) hypertension: Secondary | ICD-10-CM

## 2018-08-29 DIAGNOSIS — E119 Type 2 diabetes mellitus without complications: Secondary | ICD-10-CM | POA: Diagnosis not present

## 2018-08-29 DIAGNOSIS — R590 Localized enlarged lymph nodes: Secondary | ICD-10-CM

## 2018-08-29 DIAGNOSIS — E7800 Pure hypercholesterolemia, unspecified: Secondary | ICD-10-CM

## 2018-08-29 DIAGNOSIS — K118 Other diseases of salivary glands: Secondary | ICD-10-CM

## 2018-08-29 DIAGNOSIS — M797 Fibromyalgia: Secondary | ICD-10-CM

## 2018-09-09 ENCOUNTER — Telehealth: Payer: Self-pay

## 2018-09-09 NOTE — Telephone Encounter (Signed)
Spoke with Vaughan Basta at [facility].  Requested an expedited appeal for Duloxetine 30mg  (generic Cymbalta) with increased quantity limit (formulary is 2/day limit, patient takes 3/day).   [employer] will fax result of appeal within 72 hours.  Reference # [id number]  Provided direct number for any additional information needed.

## 2018-09-09 NOTE — Telephone Encounter (Signed)
Thanks

## 2018-09-10 ENCOUNTER — Other Ambulatory Visit (INDEPENDENT_AMBULATORY_CARE_PROVIDER_SITE_OTHER): Payer: Medicare Other

## 2018-09-10 ENCOUNTER — Other Ambulatory Visit: Payer: Self-pay

## 2018-09-10 DIAGNOSIS — E119 Type 2 diabetes mellitus without complications: Secondary | ICD-10-CM

## 2018-09-10 DIAGNOSIS — E7800 Pure hypercholesterolemia, unspecified: Secondary | ICD-10-CM

## 2018-09-10 LAB — LIPID PANEL
Cholesterol: 134 mg/dL (ref 0–200)
HDL: 36.5 mg/dL — ABNORMAL LOW (ref >39.00)
LDL Cholesterol: 66 mg/dL (ref 0–99)
NonHDL: 97.48
Total CHOL/HDL Ratio: 4
Triglycerides: 158 mg/dL — ABNORMAL HIGH (ref 0.0–149.0)
VLDL: 31.6 mg/dL (ref 0.0–40.0)

## 2018-09-10 LAB — HEMOGLOBIN A1C: Hgb A1c MFr Bld: 6.2 % (ref 4.6–6.5)

## 2018-09-11 ENCOUNTER — Telehealth: Payer: Self-pay

## 2018-09-11 NOTE — Telephone Encounter (Signed)
My Chart message sent

## 2018-09-11 NOTE — Telephone Encounter (Signed)
Pt was notified PA was done for Cymbalta and it has been approved.

## 2018-09-12 ENCOUNTER — Encounter: Payer: Self-pay | Admitting: Family Medicine

## 2018-09-12 ENCOUNTER — Other Ambulatory Visit: Payer: Self-pay

## 2018-09-12 ENCOUNTER — Ambulatory Visit (INDEPENDENT_AMBULATORY_CARE_PROVIDER_SITE_OTHER): Payer: Medicare Other | Admitting: Family Medicine

## 2018-09-12 VITALS — BP 148/80 | HR 63 | Temp 98.3°F | Wt 216.4 lb

## 2018-09-12 DIAGNOSIS — I10 Essential (primary) hypertension: Secondary | ICD-10-CM

## 2018-09-12 DIAGNOSIS — E119 Type 2 diabetes mellitus without complications: Secondary | ICD-10-CM | POA: Diagnosis not present

## 2018-09-12 MED ORDER — METOPROLOL TARTRATE 100 MG PO TABS: 100.0000 mg | ORAL_TABLET | Freq: Two times a day (BID) | ORAL | 3 refills | Status: DC

## 2018-09-12 MED ORDER — METOPROLOL TARTRATE 100 MG PO TABS: 100.0000 mg | ORAL_TABLET | Freq: Two times a day (BID) | ORAL | 1 refills | Status: DC

## 2018-09-12 NOTE — Progress Notes (Signed)
Virtual Visit via Video Note  I connected with@  on 09/12/18 at  9:20 AM EDT by a video enabled telemedicine application and verified that I am speaking with the correct person using two identifiers.  Location patient: home Location provider:work or home office Persons participating in the virtual visit: patient, provider  I discussed the limitations of evaluation and management by telemedicine and the availability of in person appointments. The patient expressed understanding and agreed to proceed.   HPI: 68 y/o WF being seen today for 2 week f/u uncontrolled HTN. Reviewed recent lab work from 08/11/18 with pt: A1c excellent, lipids excellent, BMET great.  HTN: last visit our plan was to increase her lopressor 50mg  to 1.5 tabs bid and then to 2 tabs bid if bp not getting down <130/80. No changes were made in her diabetes or cholesterol management at that time.  Interim hx: BP was down into 130s/70s for a few days on the 100 mg bid lopressor dosing. HR low 60s. BP went back up to 140-150s yest and today, likely due to acute stress from recent birth of a grandbaby that was premature and had duodenal atresia, underwent surgery yesterday. She feels well.  Has chronic L knee pain.  Occ buckliing.  Hx of R TKA and may go back to ortho after covid restrictions let up.   ROS: See pertinent positives and negatives per HPI.  Past Medical History:  Diagnosis Date  . Anxiety and depression   . Atypical chest pain 2016   ruled out for MI, stress test showed no ischemia.  CT angio NO PE.  . Cellulitis of chest wall 12/09/2015   s/p breast surgery  . Colon cancer screening 05/11/2018   2007 colonoscopy normal.  Cologuard neg 05/11/18; repeat 3 yrs.  Marland Kitchen COPD (chronic obstructive pulmonary disease) (HCC)    mild; prn albut per Dr. Lamonte Sakai as of 2017--no f/u w/pulm since then.  . DDD (degenerative disc disease)   . Diabetes mellitus without complication ([place])   . DJD (degenerative joint disease)    . Fatty liver disease, nonalcoholic   . Fibromyalgia    cymbalta  . GERD (gastroesophageal reflux disease)   . History of blood clots 2004   during cancer treatment  . History of breast cancer 2017   DCIS->bilat mastectomy (Sister had breast cancer in her 9s). Last seen by onc not long after surgery, no further tx or onc f/u needed.  Marland Kitchen History of latent tuberculosis    got approp tx x 9 mo  . History of palpitations 2016   sinus tachycardia.  30 day event monitor was normal (Dr. Irish Lack)  . History of pneumonia   . HTN (hypertension) 01/28/2012  . Hyperlipidemia 01/28/2012  . Hypothyroidism 02/14/2014  . Lipoma 01/05/2018  . Mediastinal adenopathy 2016   on CT angio done for atypical CP.  Has FH of NHL in sister. Pulm saw her 01/2016 and recommended a f/u CT 09/2016 but this doesn't appear to have been done.  If no change, then pt is candidate for LDCT lung ca screening.  . Nasal polyp 08/30/2012  . Obesity   . OSA on CPAP   . Osteoarthritis   . Osteopenia   . Peripheral neuropathy   . Peripheral vascular disease ([place]) 04   axillary,upper chest after 3 weeks on tamoxifen  . Sialoadenitis of submandibular gland 08/27/2018   LEFT. doxy rx'd by ED. ENT f/u the next day->reassured, dx'd with partial left submandibular gland obstruction w/out sign of infxn;  f/u prn.  . Tobacco abuse-unspec 10/12/2013    Past Surgical History:  Procedure Laterality Date  . ABDOMINAL HYSTERECTOMY  1991  . APPENDECTOMY  91  . BILATERAL OOPHORECTOMY  01/2003  . BREAST SURGERY Left 2004   lumpectomy  . CARPAL TUNNEL RELEASE     right  . COLONOSCOPY  10/15/2005   Dr. Bronson Ing, poor prep  . INCISION AND DRAINAGE ABSCESS Left 12/13/2015   Procedure: DRAINAGE LEFT MASTECTOMY WOUND INFECTION;  Surgeon: Fanny Skates, MD;  Location: [facility];  Service: General;  Laterality: Left;  Marland Kitchen MASTECTOMY W/ SENTINEL NODE BIOPSY Bilateral 10/26/2015   Procedure: RIGHT TOTAL MASTECTOMY WITH RIGHT SENTINEL LYMPH NODE  BIOPSY, INJECT BLUE DYE RIGHT BREAST, LEFT BREAST PROPHYLACTIC MASTECTOMY;  Surgeon: Fanny Skates, MD;  Location: [facility];  Service: General;  Laterality: Bilateral;  . NASAL SEPTUM SURGERY  08  . PILONIDAL CYST EXCISION    . THYROIDECTOMY, PARTIAL  mid 80's   right side  . TOTAL KNEE ARTHROPLASTY Right   . TRANSTHORACIC ECHOCARDIOGRAM  05/25/2008   ? mild increase in LV filling pressure, EF 65%, normal LV function/wall motion, no LVH or ventricular enlargement.    Family History  Problem Relation Age of Onset  . Cancer Mother 17       liver cancer, hep c  . Heart disease Mother        chf  . Hypertension Mother   . Hyperlipidemia Mother   . Osteoporosis Mother   . Cancer Father 56       lung; smoker  . COPD Father   . Heart disease Sister        mvp  . Breast cancer Sister        dx. 15s  . Non-Hodgkin's lymphoma Sister 68       large B cell  . Hypertension Brother   . Arthritis Brother   . Kidney disease Brother   . Prostate cancer Brother        dx. early 71s  . Other Daughter        Beal's Syndrome  . Arthritis Daughter        Beal's  . Arthritis Son        Beal's connective tissue disease  . Vision loss Maternal Grandmother   . Dementia Maternal Grandmother   . Coronary artery disease Maternal Grandmother   . Heart Problems Maternal Grandmother   . Vision loss Paternal Grandfather   . Hypertension Brother   . Benign prostatic hyperplasia Brother   . Leukemia Brother        chronic lymphatic leukemia - small/B cell  . Hypertension Brother   . COPD Brother   . Prostate cancer Brother        dx. mid-60s  . Myelodysplastic syndrome Brother        dx. early 34s  . Hypertension Brother   . Hyperlipidemia Brother   . Prostate cancer Brother        dx. mid-70s  . Melanoma Brother        (x2) melanomas dx. late 60s-early 72s  . Mental illness Brother        schizophrenia  . Breast cancer Brother        dx. 31s  . Cirrhosis Brother        hep c  .  Hypertension Sister   . Other Sister        thalessemia, anemia; hx of hysterectomy in her 71s  . Hyperlipidemia Sister   .  Gout Sister   . Diverticulitis Sister   . Other Daughter        overweight  . Breast cancer Maternal Aunt 8  . Lung cancer Cousin        maternal 1st cousin dx. 41 or younger; former smoker  . Colon cancer Cousin        maternal 1st cousin dx. late 67s-early 60s  . Cancer Cousin        maternal 1st cousin d. NOS cancer  . Emphysema Paternal Aunt   . Lung cancer Paternal Aunt        d. early 66s; smoker  . Leukemia Cousin        paternal 1st cousin d. early 53s  . Colon cancer Other 11       niece  . 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   Current Outpatient Medications:  .  amitriptyline (ELAVIL) 25 MG tablet, TAKE 1 TABLET BY MOUTH EVERYDAY AT BEDTIME (Patient taking differently: Take 25 mg by mouth at bedtime. TAKE 1 TABLET BY MOUTH EVERYDAY AT BEDTIME), Disp: 90 tablet, Rfl: 1 .  cholecalciferol (VITAMIN D) 1000 UNITS tablet, Take 1,000 Units by mouth daily., Disp: , Rfl:  .  DULoxetine (CYMBALTA) 30 MG capsule, Take 3 capsules (90 mg total) by mouth daily., Disp: 270 capsule, Rfl: 1 .  DULoxetine (CYMBALTA) 60 MG capsule, Take 60 mg by mouth daily., Disp: , Rfl:  .  esomeprazole (NEXIUM) 20 MG capsule, Take 1 capsule (20 mg total) by mouth daily at 12 noon., Disp: 90 capsule, Rfl: 3 .  Fluticasone-Salmeterol (ADVAIR DISKUS) 100-50 MCG/DOSE AEPB, TAKE 1 PUFF BY MOUTH TWICE A DAY, Disp: 180 each, Rfl: 1 .  hydrochlorothiazide (HYDRODIURIL) 25 MG tablet, Take 1 tablet (25 mg total) by mouth daily., Disp: 90 tablet, Rfl: 1 .  ibuprofen (ADVIL,MOTRIN) 200 MG tablet, Take 800 mg by mouth every 6 (six) hours as needed for headache or mild pain. , Disp: , Rfl:  .  ipratropium (ATROVENT) 0.03 % nasal spray, Place 2 sprays into the nose 4 (four) times  daily., Disp: 30 mL, Rfl: 1 .  levothyroxine (SYNTHROID, LEVOTHROID) 50 MCG tablet, Take 1 tablet (50 mcg total) by mouth daily before breakfast., Disp: 90 tablet, Rfl: 1 .  metFORMIN (GLUCOPHAGE) 1000 MG tablet, Take 1 tablet (1,000 mg total) by mouth 2 (two) times daily with a meal., Disp: 180 tablet, Rfl: 1 .  metoprolol tartrate (LOPRESSOR) 50 MG tablet, Take 1 tablet (50 mg total) by mouth 2 (two) times daily., Disp: 180 tablet, Rfl: 1 .  Multiple Vitamins-Minerals (MULTIVITAMIN WITH MINERALS) tablet, Take 1 tablet by mouth daily., Disp: , Rfl:  .  pravastatin (PRAVACHOL) 40 MG tablet, Take 1 tablet (40 mg total) by mouth daily. No more refills without office visit, Disp: 90 tablet, Rfl: 3 .  tiZANidine (ZANAFLEX) 4 MG tablet, Take 1 tablet (4 mg total) by mouth at bedtime., Disp: 90 tablet, Rfl: 1 .  albuterol (PROVENTIL HFA;VENTOLIN HFA) 108 (90 Base) MCG/ACT inhaler, INHALE 2 PUFFS INTO THE LUNGS EVERY 4 HOURS AS NEEDED FOR WHEEZING OR SHORTNESS OF BREATH OR COUGH (Patient not taking: Reported on 09/12/2018), Disp: 48 Inhaler, Rfl: 4 .  albuterol (PROVENTIL) (2.5 MG/3ML) 0.083% nebulizer solution, Take 3 mLs (2.5 mg total) by nebulization every 6 (six) hours as needed for wheezing or shortness of  breath. (Patient not taking: Reported on 09/12/2018), Disp: 150 mL, Rfl: 1  EXAM:  VITALS per patient if applicable: BP (!) 409/81 (BP Location: Left Arm, Patient Position: Sitting, Cuff Size: Large)   Pulse 63   Temp 98.3 F (36.8 C) (Oral)   Wt 216 lb 6.4 oz (98.2 kg)   BMI 37.14 kg/m    GENERAL: alert, oriented, appears well and in no acute distress  HEENT: atraumatic, conjunttiva clear, no obvious abnormalities on inspection of external nose and ears  NECK: normal movements of the head and neck  LUNGS: on inspection no signs of respiratory distress, breathing rate appears normal, no obvious gross SOB, gasping or wheezing  CV: no obvious cyanosis  MS: moves all visible extremities  without noticeable abnormality  PSYCH/NEURO: pleasant and cooperative, no obvious depression or anxiety, speech and thought processing grossly intact  LABS: none today    Chemistry      Component Value Date/Time   NA 140 08/26/2018 1531   NA 141 04/09/2018 1545   NA 141 09/15/2015 1035   K 3.6 08/26/2018 1531   K 4.0 09/15/2015 1035   CL 101 08/26/2018 1531   CO2 26 08/26/2018 1531   CO2 30 (H) 09/15/2015 1035   BUN 13 08/26/2018 1531   BUN 12 04/09/2018 1545   BUN 15.5 09/15/2015 1035   CREATININE 0.72 08/26/2018 1531   CREATININE 0.91 01/25/2016 1011   CREATININE 0.9 09/15/2015 1035      Component Value Date/Time   CALCIUM 9.7 08/26/2018 1531   CALCIUM 9.8 09/15/2015 1035   ALKPHOS 92 04/09/2018 1545   ALKPHOS 60 09/15/2015 1035   AST 26 04/09/2018 1545   AST 35 (H) 09/15/2015 1035   ALT 46 (H) 04/09/2018 1545   ALT 53 09/15/2015 1035   BILITOT 0.3 04/09/2018 1545   BILITOT 0.31 09/15/2015 1035     Lab Results  Component Value Date   WBC 8.0 08/26/2018   HGB 14.3 08/26/2018   HCT 41.6 08/26/2018   MCV 85.8 08/26/2018   PLT 268 08/26/2018   Lab Results  Component Value Date   CHOL 134 09/10/2018   HDL 36.50 (L) 09/10/2018   LDLCALC 66 09/10/2018   TRIG 158.0 (H) 09/10/2018   CHOLHDL 4 09/10/2018   Lab Results  Component Value Date   TSH 1.050 04/09/2018   Lab Results  Component Value Date   HGBA1C 6.2 09/10/2018    ASSESSMENT AND PLAN:  Discussed the following assessment and plan:   1) HTN: now much better controlled, with increase the last 2 d likely from the acute stress associated with worry about her new grandson's health. Continue to monitor home bp qd-qod and continue lopressor 100mg  bid and hctz 25mg  qd.  2) Fibromyalgia, peripheral neuropathy, chronic musculoskeletal pain, and anx/dep: all of these problems are very effectively treated by the cymbalta at 90 mg qd dosing.  We recently got this approved by her insurer.  3) Mediastinal  lymphadenopathy: will repeat CT chest when covid restrictions are lifted somewhat.   4) DM 2: great control per A1c a couple days ago-6.2%-continue metformin 1000 mg bid.  5) Chronic L knee pain: she feels like this is progressing some and with hx of R TKA she knows what it feels like to be at the point of needing possibly an injection and/or knee replacement. She can contact her ortho MD at any time to get this rechecked since it has been a couple of years since doing so.  I  discussed the assessment and treatment plan with the patient. The patient was provided an opportunity to ask questions and all were answered. The patient agreed with the plan and demonstrated an understanding of the instructions.   The patient was advised to call back or seek an in-person evaluation if the symptoms worsen or if the condition fails to improve as anticipated.  F/u: 4 mo  Signed:  Crissie Sickles, MD           09/12/2018

## 2018-09-17 ENCOUNTER — Other Ambulatory Visit: Payer: Self-pay | Admitting: Family Medicine

## 2018-09-17 DIAGNOSIS — E039 Hypothyroidism, unspecified: Secondary | ICD-10-CM

## 2018-09-24 ENCOUNTER — Encounter: Payer: Self-pay | Admitting: Family Medicine

## 2018-09-25 NOTE — Telephone Encounter (Signed)
I recommend a virtual office visit at her convenience.  In the meantime I recommend she soak the toe in warm epsom salt bath for 20 min 1-2 times per day.

## 2018-10-03 ENCOUNTER — Other Ambulatory Visit: Payer: Self-pay | Admitting: Family Medicine

## 2018-10-03 DIAGNOSIS — E039 Hypothyroidism, unspecified: Secondary | ICD-10-CM

## 2018-11-03 ENCOUNTER — Telehealth: Payer: Self-pay | Admitting: Family Medicine

## 2018-11-03 DIAGNOSIS — E039 Hypothyroidism, unspecified: Secondary | ICD-10-CM

## 2018-11-03 NOTE — Telephone Encounter (Signed)
Direct transfer from [facility] Patient requesting refill on (5) meds. There are a few that were filled on previous PCP name. Just as FYI. [facility]  Tizanidine 4mg  Amitriptylene 25mg  Hydrochlorothiazide 25mg  Metformin 1000mg  Mylan-Levothyroxine 50 mcg

## 2018-11-04 ENCOUNTER — Telehealth: Payer: Self-pay | Admitting: Family Medicine

## 2018-11-04 MED ORDER — AMITRIPTYLINE HCL 25 MG PO TABS: ORAL_TABLET | ORAL | 1 refills | Status: DC

## 2018-11-04 MED ORDER — LEVOTHYROXINE SODIUM 50 MCG PO TABS: 50.0000 ug | ORAL_TABLET | Freq: Every day | ORAL | 1 refills | Status: DC

## 2018-11-04 MED ORDER — METFORMIN HCL 1000 MG PO TABS: 1000.0000 mg | ORAL_TABLET | Freq: Two times a day (BID) | ORAL | 1 refills | Status: DC

## 2018-11-04 MED ORDER — HYDROCHLOROTHIAZIDE 25 MG PO TABS: 25.0000 mg | ORAL_TABLET | Freq: Every day | ORAL | 1 refills | Status: DC

## 2018-11-04 MED ORDER — TIZANIDINE HCL 4 MG PO TABS: 4.0000 mg | ORAL_TABLET | Freq: Every day | ORAL | 1 refills | Status: DC

## 2018-11-04 NOTE — Telephone Encounter (Signed)
Refills have been sent for 90 day supply with 1 refill. Pt was notified.

## 2018-11-04 NOTE — Telephone Encounter (Signed)
Sorry I can't work her in.-thx

## 2018-11-04 NOTE — Telephone Encounter (Signed)
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 She will be leaving first thing Thursday morning.  Please advise, thanks.

## 2018-11-04 NOTE — Telephone Encounter (Signed)
All these can be RF'd for 90d supply with 1 additional RF.-thx

## 2018-11-04 NOTE — Telephone Encounter (Signed)
Patient is having shooting pain on lateral side radiating to the bottom of her left foot toward her heel. She doesn't know if it's norapathy pain or plantar fascitis. Patient is requesting an appointment before she goes out of town on Thursday. Please advise.

## 2018-11-04 NOTE — Telephone Encounter (Signed)
Spoke with patient regarding appointment. She stated the pain went away after wearing orthopedic shoes.

## 2018-11-04 NOTE — Telephone Encounter (Signed)
All requested medications last filled 05/20/18 (90,1).  Please advise, thanks. Pt would like sent using mail order pharmacy, [facility].

## 2018-11-11 ENCOUNTER — Other Ambulatory Visit: Payer: Self-pay

## 2018-11-11 NOTE — Progress Notes (Signed)
Erroneous encounter

## 2018-11-11 NOTE — Progress Notes (Signed)
myl

## 2018-11-13 ENCOUNTER — Other Ambulatory Visit: Payer: Self-pay | Admitting: Family Medicine

## 2018-11-13 DIAGNOSIS — M797 Fibromyalgia: Secondary | ICD-10-CM

## 2018-11-13 NOTE — Telephone Encounter (Signed)
Forwarding medication refill to provider for review. 

## 2018-11-20 ENCOUNTER — Ambulatory Visit: Payer: Medicare Other | Admitting: Family Medicine

## 2018-11-29 ENCOUNTER — Other Ambulatory Visit: Payer: Self-pay | Admitting: Family Medicine

## 2018-11-29 DIAGNOSIS — M797 Fibromyalgia: Secondary | ICD-10-CM

## 2018-12-03 ENCOUNTER — Telehealth: Payer: Self-pay

## 2018-12-03 DIAGNOSIS — M797 Fibromyalgia: Secondary | ICD-10-CM

## 2018-12-03 MED ORDER — DULOXETINE HCL 30 MG PO CPEP: 90.0000 mg | ORAL_CAPSULE | Freq: Every day | ORAL | 3 refills | Status: DC

## 2018-12-03 NOTE — Telephone Encounter (Signed)
RF request for Duloxetine LOV: 09/12/18 Next ov: 01/16/19 Last written: 05/20/18 (270,1) Rx by old PCP  Please advise, thanks.

## 2018-12-08 ENCOUNTER — Other Ambulatory Visit: Payer: Self-pay | Admitting: Family Medicine

## 2018-12-08 NOTE — Telephone Encounter (Signed)
Do you mean 90d supply with 3 RF's or do you mean 1 yr supply all at once?

## 2018-12-08 NOTE — Telephone Encounter (Signed)
Patient requesting 1 year supply of metoprolol, OKay to send?

## 2018-12-09 NOTE — Telephone Encounter (Signed)
90 x 3 rfs - if appropriate.   She has upcoming appt 01/16/2019.

## 2019-01-16 ENCOUNTER — Ambulatory Visit: Payer: Medicare Other | Admitting: Family Medicine

## 2019-01-16 ENCOUNTER — Encounter: Payer: Self-pay | Admitting: Family Medicine

## 2019-01-26 ENCOUNTER — Ambulatory Visit: Payer: Medicare Other | Admitting: Family Medicine

## 2019-02-02 ENCOUNTER — Ambulatory Visit: Payer: Medicare Other | Admitting: Family Medicine

## 2019-02-10 ENCOUNTER — Other Ambulatory Visit: Payer: Self-pay

## 2019-02-10 ENCOUNTER — Encounter: Payer: Self-pay | Admitting: Family Medicine

## 2019-02-10 ENCOUNTER — Ambulatory Visit (INDEPENDENT_AMBULATORY_CARE_PROVIDER_SITE_OTHER): Payer: Medicare Other | Admitting: Family Medicine

## 2019-02-10 VITALS — BP 135/76 | HR 64 | Temp 97.8°F | Wt 217.0 lb

## 2019-02-10 DIAGNOSIS — E7800 Pure hypercholesterolemia, unspecified: Secondary | ICD-10-CM | POA: Diagnosis not present

## 2019-02-10 DIAGNOSIS — E119 Type 2 diabetes mellitus without complications: Secondary | ICD-10-CM | POA: Diagnosis not present

## 2019-02-10 DIAGNOSIS — R590 Localized enlarged lymph nodes: Secondary | ICD-10-CM | POA: Diagnosis not present

## 2019-02-10 DIAGNOSIS — I10 Essential (primary) hypertension: Secondary | ICD-10-CM

## 2019-02-10 DIAGNOSIS — E039 Hypothyroidism, unspecified: Secondary | ICD-10-CM | POA: Diagnosis not present

## 2019-02-10 NOTE — Progress Notes (Signed)
Virtual Visit via Video Note  I connected with pt on 02/10/19 at 10:30 AM EDT by a video enabled telemedicine application and verified that I am speaking with the correct person using two identifiers.  Location patient: home Location provider:work or home office Persons participating in the virtual visit: patient, provider  I discussed the limitations of evaluation and management by telemedicine and the availability of in person appointments. The patient expressed understanding and agreed to proceed.  Telemedicine visit is a necessity given the COVID-19 restrictions in place at the current time.  HPI: 68 y/o WF being seen today for 5 mo f/u DM 2, HTN, HLD, and hypothyroidism. She also has fibromyalgia, for which cymbalta at 90 mg qd dosing has been significantly helpful.  DM: not monitoring glucoses.  Takes metformin faithfully. Diet is fair at best.  HTN: no home bp monitoring but today's is fine.  Takes hctz and lopressor as rx'd.  HLD: compliant with statin. Needs to exercise.  Diet needs signif improvement.  Hypoth: takes T4 on empty stomach way before eating.  She continues to smoke approx 8 cigs/day and admits she needs to continue to work on totally quitting.   Has hx of hammertoes on R foot 2nd and 3rd toes, hobbles around, very difficult to wear shoe on that foot.  Has had foot surgery by Dr.Regal in the remote past and will need to contact him to set up appt to discuss a plan for this.  Also, has severe knee arthritis and in the near future she will need to be referred to an orthopedist for evaluation.  ROS: no CP, no SOB, no wheezing, no cough, no dizziness, no HAs, no rashes, no melena/hematochezia.  No polyuria or polydipsia.     Past Medical History:  Diagnosis Date  . Anxiety and depression   . Atypical chest pain 2016   ruled out for MI, stress test showed no ischemia.  CT angio NO PE.  . Cellulitis of chest wall 12/09/2015   s/p breast surgery  . Colon  cancer screening 05/11/2018   2007 colonoscopy normal.  Cologuard neg 05/11/18; repeat 3 yrs.  Marland Kitchen COPD (chronic obstructive pulmonary disease) (HCC)    mild; prn albut per Dr. Lamonte Sakai as of 2017--no f/u w/pulm since then.  . DDD (degenerative disc disease)   . Diabetes mellitus without complication ([facility])   . DJD (degenerative joint disease)   . Fatty liver disease, nonalcoholic   . Fibromyalgia    cymbalta  . GERD (gastroesophageal reflux disease)   . History of blood clots 2004   during cancer treatment  . History of breast cancer 2017   DCIS->bilat mastectomy (Sister had breast cancer in her 34s). Last seen by onc not long after surgery, no further tx or onc f/u needed.  Marland Kitchen History of latent tuberculosis    got approp tx x 9 mo  . History of palpitations 2016   sinus tachycardia.  30 day event monitor was normal (Dr. Irish Lack)  . History of pneumonia   . HTN (hypertension) 01/28/2012  . Hyperlipidemia 01/28/2012  . Hypothyroidism 02/14/2014  . Lipoma 01/05/2018  . Mediastinal adenopathy 2016   on CT angio done for atypical CP.  Has FH of NHL in sister. Pulm saw her 01/2016 and recommended a f/u CT 09/2016 but this doesn't appear to have been done.  If no change, then pt is candidate for LDCT lung ca screening.  . Nasal polyp 08/30/2012  . Obesity   . OSA on CPAP   .  Osteoarthritis   . Osteopenia   . Peripheral neuropathy   . Peripheral vascular disease ([facility]) 04   axillary,upper chest after 3 weeks on tamoxifen  . Sialoadenitis of submandibular gland 08/27/2018   LEFT. doxy rx'd by ED. ENT f/u the next day->reassured, dx'd with partial left submandibular gland obstruction w/out sign of infxn; f/u prn.  . Tobacco abuse-unspec 10/12/2013    Past Surgical History:  Procedure Laterality Date  . ABDOMINAL HYSTERECTOMY  1991  . APPENDECTOMY  91  . BILATERAL OOPHORECTOMY  01/2003  . BREAST SURGERY Left 2004   lumpectomy  . CARPAL TUNNEL RELEASE     right  . COLONOSCOPY  10/15/2005   Dr.  Bronson Ing, poor prep  . INCISION AND DRAINAGE ABSCESS Left 12/13/2015   Procedure: DRAINAGE LEFT MASTECTOMY WOUND INFECTION;  Surgeon: Fanny Skates, MD;  Location: [facility];  Service: General;  Laterality: Left;  Marland Kitchen MASTECTOMY W/ SENTINEL NODE BIOPSY Bilateral 10/26/2015   Procedure: RIGHT TOTAL MASTECTOMY WITH RIGHT SENTINEL LYMPH NODE BIOPSY, INJECT BLUE DYE RIGHT BREAST, LEFT BREAST PROPHYLACTIC MASTECTOMY;  Surgeon: Fanny Skates, MD;  Location: [facility];  Service: General;  Laterality: Bilateral;  . NASAL SEPTUM SURGERY  08  . PILONIDAL CYST EXCISION    . THYROIDECTOMY, PARTIAL  mid 80's   right side  . TOTAL KNEE ARTHROPLASTY Right   . TRANSTHORACIC ECHOCARDIOGRAM  05/25/2008   ? mild increase in LV filling pressure, EF 65%, normal LV function/wall motion, no LVH or ventricular enlargement.    Family History  Problem Relation Age of Onset  . Cancer Mother 30       liver cancer, hep c  . Heart disease Mother        chf  . Hypertension Mother   . Hyperlipidemia Mother   . Osteoporosis Mother   . Cancer Father 11       lung; smoker  . COPD Father   . Heart disease Sister        mvp  . Breast cancer Sister        dx. 68s  . Non-Hodgkin's lymphoma Sister 79       large B cell  . Hypertension Brother   . Arthritis Brother   . Kidney disease Brother   . Prostate cancer Brother        dx. early 13s  . Other Daughter        Beal's Syndrome  . Arthritis Daughter        Beal's  . Arthritis Son        Beal's connective tissue disease  . Vision loss Maternal Grandmother   . Dementia Maternal Grandmother   . Coronary artery disease Maternal Grandmother   . Heart Problems Maternal Grandmother   . Vision loss Paternal Grandfather   . Hypertension Brother   . Benign prostatic hyperplasia Brother   . Leukemia Brother        chronic lymphatic leukemia - small/B cell  . Hypertension Brother   . COPD Brother   . Prostate cancer Brother        dx. mid-60s  . Myelodysplastic  syndrome Brother        dx. early 60s  . Hypertension Brother   . Hyperlipidemia Brother   . Prostate cancer Brother        dx. mid-70s  . Melanoma Brother        (x2) melanomas dx. late 60s-early 67s  . Mental illness Brother        schizophrenia  .  Breast cancer Brother        dx. 26s  . Cirrhosis Brother        hep c  . Hypertension Sister   . Other Sister        thalessemia, anemia; hx of hysterectomy in her 85s  . Hyperlipidemia Sister   . Gout Sister   . Diverticulitis Sister   . Other Daughter        overweight  . Breast cancer Maternal Aunt 89  . Lung cancer Cousin        maternal 1st cousin dx. 4 or younger; former smoker  . Colon cancer Cousin        maternal 1st cousin dx. late 61s-early 60s  . Cancer Cousin        maternal 1st cousin d. NOS cancer  . Emphysema Paternal Aunt   . Lung cancer Paternal Aunt        d. early 19s; smoker  . Leukemia Cousin        paternal 1st cousin d. early 65s  . Colon cancer Other 38       niece  . Melanoma Other        niece    SOCIAL HX:  Social History   Socioeconomic History  . Marital status: Married    Spouse name: Not on file  . Number of children: Not on file  . Years of education: Not on file  . Highest education level: Not on file  Occupational History  . Not on file  Social Needs  . Financial resource strain: Not on file  . Food insecurity    Worry: Not on file    Inability: Not on file  . Transportation needs    Medical: Not on file    Non-medical: Not on file  Tobacco Use  . Smoking status: Current Every Day Smoker    Packs/day: 0.50    Years: 35.00    Pack years: 17.50    Types: Cigarettes  . Smokeless tobacco: Never Used  Substance and Sexual Activity  . Alcohol use: No    Alcohol/week: 0.0 standard drinks  . Drug use: No  . Sexual activity: Never  Lifestyle  . Physical activity    Days per week: Not on file    Minutes per session: Not on file  . Stress: Not on file  Relationships  .  Social Herbalist on phone: Not on file    Gets together: Not on file    Attends religious service: Not on file    Active member of club or organization: Not on file    Attends meetings of clubs or organizations: Not on file    Relationship status: Not on file  Other Topics Concern  . Not on file  Social History Narrative   Married, 3 children.  Two have Beal's syndrome (connective tissue d/o similar to Marfan's).   Orig from [place].  [place] since 20s.   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   Tobacco (current as of 08/2018).   No alcohol.      Current Outpatient Medications:  .  amitriptyline (ELAVIL) 25 MG tablet, TAKE 1 TABLET BY MOUTH EVERYDAY AT BEDTIME, Disp: 90 tablet, Rfl: 1 .  cholecalciferol (VITAMIN D) 1000 UNITS tablet, Take 1,000 Units by mouth daily., Disp: , Rfl:  .  DULoxetine (CYMBALTA) 30 MG capsule, Take 3 capsules (90 mg total) by mouth daily.,  Disp: 270 capsule, Rfl: 3 .  esomeprazole (NEXIUM) 20 MG capsule, Take 1 capsule (20 mg total) by mouth daily at 12 noon., Disp: 90 capsule, Rfl: 3 .  hydrochlorothiazide (HYDRODIURIL) 25 MG tablet, Take 1 tablet (25 mg total) by mouth daily., Disp: 90 tablet, Rfl: 1 .  ibuprofen (ADVIL,MOTRIN) 200 MG tablet, Take 800 mg by mouth every 6 (six) hours as needed for headache or mild pain. , Disp: , Rfl:  .  levothyroxine (SYNTHROID) 50 MCG tablet, Take 1 tablet (50 mcg total) by mouth daily before breakfast., Disp: 90 tablet, Rfl: 1 .  metFORMIN (GLUCOPHAGE) 1000 MG tablet, Take 1 tablet (1,000 mg total) by mouth 2 (two) times daily with a meal., Disp: 180 tablet, Rfl: 1 .  metoprolol tartrate (LOPRESSOR) 100 MG tablet, TAKE 1 TABLET BY MOUTH  TWICE DAILY, Disp: 180 tablet, Rfl: 3 .  Multiple Vitamins-Minerals (MULTIVITAMIN WITH MINERALS) tablet, Take 1 tablet by mouth daily., Disp: , Rfl:  .  pravastatin (PRAVACHOL) 40 MG tablet, Take 1 tablet (40 mg  total) by mouth daily. No more refills without office visit, Disp: 90 tablet, Rfl: 3 .  tiZANidine (ZANAFLEX) 4 MG tablet, Take 1 tablet (4 mg total) by mouth at bedtime., Disp: 90 tablet, Rfl: 1 .  albuterol (PROVENTIL HFA;VENTOLIN HFA) 108 (90 Base) MCG/ACT inhaler, INHALE 2 PUFFS INTO THE LUNGS EVERY 4 HOURS AS NEEDED FOR WHEEZING OR SHORTNESS OF BREATH OR COUGH (Patient not taking: Reported on 09/12/2018), Disp: 48 Inhaler, Rfl: 4 .  albuterol (PROVENTIL) (2.5 MG/3ML) 0.083% nebulizer solution, Take 3 mLs (2.5 mg total) by nebulization every 6 (six) hours as needed for wheezing or shortness of breath. (Patient not taking: Reported on 09/12/2018), Disp: 150 mL, Rfl: 1 .  Fluticasone-Salmeterol (ADVAIR DISKUS) 100-50 MCG/DOSE AEPB, TAKE 1 PUFF BY MOUTH TWICE A DAY (Patient not taking: Reported on 02/10/2019), Disp: 180 each, Rfl: 1 .  ipratropium (ATROVENT) 0.03 % nasal spray, Place 2 sprays into the nose 4 (four) times daily. (Patient not taking: Reported on 02/10/2019), Disp: 30 mL, Rfl: 1  EXAM:  VITALS per patient if applicable: BP [id number] (BP Location: Left Arm, Patient Position: Sitting, Cuff Size: Large)   Pulse 64   Temp 97.8 F (36.6 C) (Temporal)   Wt 217 lb (98.4 kg)   BMI 37.25 kg/m    GENERAL: alert, oriented, appears well and in no acute distress  HEENT: atraumatic, conjunttiva clear, no obvious abnormalities on inspection of external nose and ears  NECK: normal movements of the head and neck  LUNGS: on inspection no signs of respiratory distress, breathing rate appears normal, no obvious gross SOB, gasping or wheezing  CV: no obvious cyanosis  MS: moves all visible extremities without noticeable abnormality  PSYCH/NEURO: pleasant and cooperative, no obvious depression or anxiety, speech and thought processing grossly intact  LABS: none today  Lab Results  Component Value Date   TSH 1.050 04/09/2018   Lab Results  Component Value Date   WBC 8.0 08/26/2018   HGB 14.3  08/26/2018   HCT 41.6 08/26/2018   MCV 85.8 08/26/2018   PLT 268 08/26/2018   Lab Results  Component Value Date   CREATININE 0.72 08/26/2018   BUN 13 08/26/2018   NA 140 08/26/2018   K 3.6 08/26/2018   CL 101 08/26/2018   CO2 26 08/26/2018   Lab Results  Component Value Date   ALT 46 (H) 04/09/2018   AST 26 04/09/2018   ALKPHOS 92 04/09/2018   BILITOT  0.3 04/09/2018   Lab Results  Component Value Date   CHOL 134 09/10/2018   Lab Results  Component Value Date   HDL 36.50 (L) 09/10/2018   Lab Results  Component Value Date   LDLCALC 66 09/10/2018   Lab Results  Component Value Date   TRIG 158.0 (H) 09/10/2018   Lab Results  Component Value Date   CHOLHDL 4 09/10/2018   Lab Results  Component Value Date   HGBA1C 6.2 09/10/2018    ASSESSMENT AND PLAN:  Discussed the following assessment and plan:  1) DM 2: compliant with med. Diet fair, at best. Needs to exercise. HbA1c-future. BMET-future.  2) HTN: The current medical regimen is effective;  continue present plan and medications. BMET-future.  3) HLD: lipid panel good 5 mo ago.  Tolerating pravastatin. Plan repeat FLP and hepatic panel at next f/u visit.  4) Hypothyroidism: taking T4 correctly. TSH monitoring-future.  5) R foot hammertoes: per pt report.  She plans on seeing Dr. Paulla Dolly to address this.  6) Knee osteoarthritis: per pt it is severe and she will be requesting referral to orthopedist in the near future.  7) Hx of R perihilar and mediastinal lymphadenopathy on a chest CT angio done to r/o PE back in May 2016.  This was never followed-up on.   I have ordered a repeat CT chest with contrast to follow this up.  I discussed the assessment and treatment plan with the patient. The patient was provided an opportunity to ask questions and all were answered. The patient agreed with the plan and demonstrated an understanding of the instructions.   The patient was advised to call back or seek an  in-person evaluation if the symptoms worsen or if the condition fails to improve as anticipated.  F/u: 4 mo CPE  Signed:  Crissie Sickles, MD           02/10/2019

## 2019-02-16 ENCOUNTER — Ambulatory Visit (INDEPENDENT_AMBULATORY_CARE_PROVIDER_SITE_OTHER): Payer: Medicare Other | Admitting: Family Medicine

## 2019-02-16 ENCOUNTER — Other Ambulatory Visit: Payer: Self-pay

## 2019-02-16 DIAGNOSIS — I10 Essential (primary) hypertension: Secondary | ICD-10-CM | POA: Diagnosis not present

## 2019-02-16 DIAGNOSIS — E039 Hypothyroidism, unspecified: Secondary | ICD-10-CM

## 2019-02-16 DIAGNOSIS — E119 Type 2 diabetes mellitus without complications: Secondary | ICD-10-CM | POA: Diagnosis not present

## 2019-02-16 LAB — HEMOGLOBIN A1C: Hgb A1c MFr Bld: 6.4 % (ref 4.6–6.5)

## 2019-02-16 LAB — TSH: TSH: 1.88 u[IU]/mL (ref 0.35–4.50)

## 2019-02-16 LAB — BASIC METABOLIC PANEL
BUN: 14 mg/dL (ref 6–23)
CO2: 28 mEq/L (ref 19–32)
Calcium: 9.3 mg/dL (ref 8.4–10.5)
Chloride: 100 mEq/L (ref 96–112)
Creatinine, Ser: 0.78 mg/dL (ref 0.40–1.20)
GFR: 73.48 mL/min (ref >60.00)
Glucose, Bld: 107 mg/dL — ABNORMAL HIGH (ref 70–99)
Potassium: 4 mEq/L (ref 3.5–5.1)
Sodium: 139 mEq/L (ref 135–145)

## 2019-02-19 DIAGNOSIS — H2513 Age-related nuclear cataract, bilateral: Secondary | ICD-10-CM | POA: Diagnosis not present

## 2019-02-19 DIAGNOSIS — H5203 Hypermetropia, bilateral: Secondary | ICD-10-CM | POA: Diagnosis not present

## 2019-02-19 DIAGNOSIS — D443 Neoplasm of uncertain behavior of pituitary gland: Secondary | ICD-10-CM | POA: Diagnosis not present

## 2019-02-19 DIAGNOSIS — E119 Type 2 diabetes mellitus without complications: Secondary | ICD-10-CM | POA: Diagnosis not present

## 2019-02-19 DIAGNOSIS — H43392 Other vitreous opacities, left eye: Secondary | ICD-10-CM | POA: Diagnosis not present

## 2019-02-19 LAB — HM DIABETES EYE EXAM

## 2019-02-23 ENCOUNTER — Other Ambulatory Visit: Payer: Self-pay

## 2019-02-23 ENCOUNTER — Ambulatory Visit (INDEPENDENT_AMBULATORY_CARE_PROVIDER_SITE_OTHER): Payer: Medicare Other

## 2019-02-23 DIAGNOSIS — I313 Pericardial effusion (noninflammatory): Secondary | ICD-10-CM | POA: Diagnosis not present

## 2019-02-23 DIAGNOSIS — R590 Localized enlarged lymph nodes: Secondary | ICD-10-CM | POA: Diagnosis not present

## 2019-02-23 MED ORDER — IOHEXOL 300 MG/ML  SOLN
100.0000 mL | Freq: Once | INTRAMUSCULAR | Status: AC | PRN
  Administered 2019-02-23: 75 mL via INTRAVENOUS

## 2019-02-24 ENCOUNTER — Encounter: Payer: Self-pay | Admitting: Family Medicine

## 2019-03-03 ENCOUNTER — Other Ambulatory Visit: Payer: Self-pay | Admitting: Family Medicine

## 2019-03-03 NOTE — Telephone Encounter (Signed)
Requested medication (s) are due for refill today: yes  Requested medication (s) are on the active medication list: yes  Last refill:  01/10/2019  Future visit scheduled: yes  Notes to clinic: review for refill Last filled by different provider    Requested Prescriptions  Pending Prescriptions Disp Refills   pravastatin (PRAVACHOL) 40 MG tablet [Pharmacy Med Name: PRAVASTATIN SOD 40MG  TABLET] 90 tablet 3    Sig: TAKE 1 TABLET BY MOUTH  DAILY     Cardiovascular:  Antilipid - Statins Failed - 03/03/2019  4:39 AM      Failed - HDL in normal range and within 360 days    HDL  Date Value Ref Range Status  09/10/2018 36.50 (L) >39.00 mg/dL Final  09/03/2017 31 (L) >39 mg/dL Final         Failed - Triglycerides in normal range and within 360 days    Triglycerides  Date Value Ref Range Status  09/10/2018 158.0 (H) 0.0 - 149.0 mg/dL Final    Comment:    Normal:  <150 mg/dLBorderline High:  150 - 199 mg/dL         Passed - Total Cholesterol in normal range and within 360 days    Cholesterol, Total  Date Value Ref Range Status  09/03/2017 115 100 - 199 mg/dL Final   Cholesterol  Date Value Ref Range Status  09/10/2018 134 0 - 200 mg/dL Final    Comment:    ATP III Classification       Desirable:  < 200 mg/dL               Borderline High:  200 - 239 mg/dL          High:  > = 240 mg/dL         Passed - LDL in normal range and within 360 days    LDL Calculated  Date Value Ref Range Status  09/03/2017 45 0 - 99 mg/dL Final   LDL Cholesterol  Date Value Ref Range Status  09/10/2018 66 0 - 99 mg/dL Final         Passed - Patient is not pregnant      Passed - Valid encounter within last 12 months    Recent Outpatient Visits          3 weeks ago Diabetes mellitus without complication ([facility])   [place], Adrian Blackwater, MD   5 months ago Essential hypertension   [facility] [place], Adrian Blackwater, MD   6 months ago Uncontrolled  hypertension   [place], Adrian Blackwater, MD   9 months ago [facility] to [facility] preventive visit   [facility], Arlie Solomons, MD   10 months ago COPD exacerbation [facility])   [facility], Lilia Argue, MD      Future Appointments            In 3 months [place], Adrian Blackwater, MD [place], [facility]

## 2019-03-20 ENCOUNTER — Other Ambulatory Visit: Payer: Self-pay | Admitting: Family Medicine

## 2019-03-20 DIAGNOSIS — E039 Hypothyroidism, unspecified: Secondary | ICD-10-CM

## 2019-04-07 ENCOUNTER — Ambulatory Visit: Payer: Medicare Other | Admitting: Family Medicine

## 2019-04-11 ENCOUNTER — Other Ambulatory Visit: Payer: Self-pay | Admitting: Family Medicine

## 2019-04-13 NOTE — Telephone Encounter (Signed)
RF request for Tizanidine LOV:02/10/19 Next ov: 06/16/19 Last written: 11/04/18 (90,1) last filled 01/21/19  Please Advise. Medication pending

## 2019-04-24 ENCOUNTER — Encounter: Payer: Self-pay | Admitting: *Deleted

## 2019-05-18 ENCOUNTER — Encounter: Payer: Self-pay | Admitting: Family Medicine

## 2019-05-22 DIAGNOSIS — M1712 Unilateral primary osteoarthritis, left knee: Secondary | ICD-10-CM | POA: Diagnosis not present

## 2019-05-22 DIAGNOSIS — M7062 Trochanteric bursitis, left hip: Secondary | ICD-10-CM | POA: Insufficient documentation

## 2019-05-22 DIAGNOSIS — M7052 Other bursitis of knee, left knee: Secondary | ICD-10-CM | POA: Diagnosis not present

## 2019-05-22 DIAGNOSIS — M7071 Other bursitis of hip, right hip: Secondary | ICD-10-CM | POA: Insufficient documentation

## 2019-05-22 DIAGNOSIS — M7051 Other bursitis of knee, right knee: Secondary | ICD-10-CM | POA: Diagnosis not present

## 2019-06-11 ENCOUNTER — Other Ambulatory Visit: Payer: Self-pay

## 2019-06-15 ENCOUNTER — Other Ambulatory Visit: Payer: Self-pay

## 2019-06-16 ENCOUNTER — Ambulatory Visit (INDEPENDENT_AMBULATORY_CARE_PROVIDER_SITE_OTHER): Payer: Medicare Other | Admitting: Family Medicine

## 2019-06-16 ENCOUNTER — Encounter: Payer: Self-pay | Admitting: Family Medicine

## 2019-06-16 VITALS — BP 136/78 | HR 77 | Temp 97.7°F | Resp 16 | Ht 63.5 in | Wt 228.6 lb

## 2019-06-16 DIAGNOSIS — E119 Type 2 diabetes mellitus without complications: Secondary | ICD-10-CM

## 2019-06-16 DIAGNOSIS — E7800 Pure hypercholesterolemia, unspecified: Secondary | ICD-10-CM | POA: Diagnosis not present

## 2019-06-16 DIAGNOSIS — Z23 Encounter for immunization: Secondary | ICD-10-CM

## 2019-06-16 DIAGNOSIS — Z0000 Encounter for general adult medical examination without abnormal findings: Secondary | ICD-10-CM | POA: Diagnosis not present

## 2019-06-16 DIAGNOSIS — I10 Essential (primary) hypertension: Secondary | ICD-10-CM | POA: Diagnosis not present

## 2019-06-16 LAB — COMPREHENSIVE METABOLIC PANEL
ALT: 33 U/L (ref 0–35)
AST: 19 U/L (ref 0–37)
Albumin: 4.2 g/dL (ref 3.5–5.2)
Alkaline Phosphatase: 62 U/L (ref 39–117)
BUN: 16 mg/dL (ref 6–23)
CO2: 31 mEq/L (ref 19–32)
Calcium: 9.3 mg/dL (ref 8.4–10.5)
Chloride: 99 mEq/L (ref 96–112)
Creatinine, Ser: 0.84 mg/dL (ref 0.40–1.20)
GFR: 67.39 mL/min (ref >60.00)
Glucose, Bld: 102 mg/dL — ABNORMAL HIGH (ref 70–99)
Potassium: 3.9 mEq/L (ref 3.5–5.1)
Sodium: 138 mEq/L (ref 135–145)
Total Bilirubin: 0.4 mg/dL (ref 0.2–1.2)
Total Protein: 6.4 g/dL (ref 6.0–8.3)

## 2019-06-16 LAB — LIPID PANEL
Cholesterol: 125 mg/dL (ref 0–200)
HDL: 42.6 mg/dL (ref >39.00)
LDL Cholesterol: 55 mg/dL (ref 0–99)
NonHDL: 82.37
Total CHOL/HDL Ratio: 3
Triglycerides: 137 mg/dL (ref 0.0–149.0)
VLDL: 27.4 mg/dL (ref 0.0–40.0)

## 2019-06-16 LAB — HEMOGLOBIN A1C: Hgb A1c MFr Bld: 6.5 % (ref 4.6–6.5)

## 2019-06-16 LAB — MICROALBUMIN / CREATININE URINE RATIO
Creatinine,U: 27.1 mg/dL
Microalb Creat Ratio: 2.6 mg/g (ref 0.0–30.0)
Microalb, Ur: <0.7 mg/dL (ref 0.0–1.9)

## 2019-06-16 MED ORDER — ZOSTER VAC RECOMB ADJUVANTED 50 MCG/0.5ML IM SUSR: 0.5000 mL | Freq: Once | INTRAMUSCULAR | 0 refills | Status: AC

## 2019-06-16 MED ORDER — LISINOPRIL 10 MG PO TABS: 10.0000 mg | ORAL_TABLET | Freq: Every day | ORAL | 0 refills | Status: DC

## 2019-06-16 NOTE — Progress Notes (Signed)
Office Note 06/16/2019  CC:  Chief Complaint  Patient presents with  . Annual Exam    pt is fasting    HPI:  Susan [name] is a 69 y.o. White female who is here for annual health maintenance exam.  HTN: home bp/hr review shows avg syst 145-150, diast upper 70s avg, HR avg 75.  Feet get cold when she lies down to go to bed. Chronic tingling/numbness sensation long term symptom. No color change.  No claudication.  Past Medical History:  Diagnosis Date  . Anxiety and depression   . Atypical chest pain 2016   ruled out for MI, stress test showed no ischemia.  CT angio NO PE.  . Cancer ([facility])   . Cellulitis of chest wall 12/09/2015   s/p breast surgery  . Colon cancer screening 05/11/2018   2007 colonoscopy normal.  Cologuard neg 05/11/18; repeat 3 yrs.  Marland Kitchen COPD (chronic obstructive pulmonary disease) (HCC)    mild; prn albut per Dr. Lamonte Sakai as of 2017--no f/u w/pulm since then.  . DDD (degenerative disc disease)   . Diabetes mellitus without complication ([facility])   . DJD (degenerative joint disease)   . Fatty liver disease, nonalcoholic   . Fibromyalgia    cymbalta  . GERD (gastroesophageal reflux disease)   . History of blood clots 2004   during cancer treatment  . History of breast cancer 2017   DCIS->bilat mastectomy (Sister had breast cancer in her 5s). Last seen by onc not long after surgery, no further tx or onc f/u needed.  Marland Kitchen History of latent tuberculosis    got approp tx x 9 mo  . History of palpitations 2016   sinus tachycardia.  30 day event monitor was normal (Dr. Irish Lack)  . History of pneumonia   . HTN (hypertension) 01/28/2012  . Hyperlipidemia 01/28/2012  . Hypothyroidism 02/14/2014  . Lipoma 01/05/2018  . Mediastinal adenopathy 2016   on CT angio done for atypical CP.  Has FH of NHL in sister. Pulm saw her 01/2016 and recommended a f/u CT 09/2016 but this doesn't appear to have been done.  Rpt CT 02/2019->resolved.  . Nasal polyp 08/30/2012  . Obesity   .  OSA on CPAP   . Osteoarthritis   . Osteopenia   . Peripheral neuropathy   . Peripheral vascular disease ([facility]) 04   axillary,upper chest after 3 weeks on tamoxifen  . Sialoadenitis of submandibular gland 08/27/2018   LEFT. doxy rx'd by ED. ENT f/u the next day->reassured, dx'd with partial left submandibular gland obstruction w/out sign of infxn; f/u prn.  . Tobacco abuse-unspec 10/12/2013    Past Surgical History:  Procedure Laterality Date  . ABDOMINAL HYSTERECTOMY  1991  . APPENDECTOMY  91  . BILATERAL OOPHORECTOMY  01/2003  . BREAST SURGERY Left 2004   lumpectomy  . CARPAL TUNNEL RELEASE     right  . COLONOSCOPY  10/15/2005   Dr. Bronson Ing, poor prep  . INCISION AND DRAINAGE ABSCESS Left 12/13/2015   Procedure: DRAINAGE LEFT MASTECTOMY WOUND INFECTION;  Surgeon: Fanny Skates, MD;  Location: [facility];  Service: General;  Laterality: Left;  Marland Kitchen MASTECTOMY W/ SENTINEL NODE BIOPSY Bilateral 10/26/2015   Procedure: RIGHT TOTAL MASTECTOMY WITH RIGHT SENTINEL LYMPH NODE BIOPSY, INJECT BLUE DYE RIGHT BREAST, LEFT BREAST PROPHYLACTIC MASTECTOMY;  Surgeon: Fanny Skates, MD;  Location: [facility];  Service: General;  Laterality: Bilateral;  . NASAL SEPTUM SURGERY  08  . PILONIDAL CYST EXCISION    . THYROIDECTOMY, PARTIAL  mid 80's   right side  . TOTAL KNEE ARTHROPLASTY Right   . TRANSTHORACIC ECHOCARDIOGRAM  05/25/2008   ? mild increase in LV filling pressure, EF 65%, normal LV function/wall motion, no LVH or ventricular enlargement.    Family History  Problem Relation Age of Onset  . Cancer Mother 47       liver cancer, hep c  . Heart disease Mother        chf  . Hypertension Mother   . Hyperlipidemia Mother   . Osteoporosis Mother   . Cancer Father 27       lung; smoker  . COPD Father   . Heart disease Sister        mvp  . Breast cancer Sister        dx. 65s  . Non-Hodgkin's lymphoma Sister 88       large B cell  . Hypertension Brother   . Arthritis Brother   . Kidney  disease Brother   . Prostate cancer Brother        dx. early 53s  . Other Daughter        Beal's Syndrome  . Arthritis Daughter        Beal's  . Arthritis Son        Beal's connective tissue disease  . Vision loss Maternal Grandmother   . Dementia Maternal Grandmother   . Coronary artery disease Maternal Grandmother   . Heart Problems Maternal Grandmother   . Vision loss Paternal Grandfather   . Hypertension Brother   . Benign prostatic hyperplasia Brother   . Leukemia Brother        chronic lymphatic leukemia - small/B cell  . Hypertension Brother   . COPD Brother   . Prostate cancer Brother        dx. mid-60s  . Myelodysplastic syndrome Brother        dx. early 76s  . Hypertension Brother   . Hyperlipidemia Brother   . Prostate cancer Brother        dx. mid-70s  . Melanoma Brother        (x2) melanomas dx. late 60s-early 60s  . Mental illness Brother        schizophrenia  . Breast cancer Brother        dx. 72s  . Cirrhosis Brother        hep c  . Hypertension Sister   . Other Sister        thalessemia, anemia; hx of hysterectomy in her 81s  . Hyperlipidemia Sister   . Gout Sister   . Diverticulitis Sister   . Other Daughter        overweight  . Breast cancer Maternal Aunt 25  . Lung cancer Cousin        maternal 1st cousin dx. 60 or younger; former smoker  . Colon cancer Cousin        maternal 1st cousin dx. late 72s-early 60s  . Cancer Cousin        maternal 1st cousin d. NOS cancer  . Emphysema Paternal Aunt   . Lung cancer Paternal Aunt        d. early 32s; smoker  . Leukemia Cousin        paternal 1st cousin d. early 46s  . Colon cancer Other 70       niece  . Melanoma Other        niece    Social History   Socioeconomic History  .  Marital status: Married    Spouse name: Not on file  . Number of children: Not on file  . Years of education: Not on file  . Highest education level: Not on file  Occupational History  . Not on file  Tobacco Use   . Smoking status: Current Every Day Smoker    Packs/day: 0.50    Years: 35.00    Pack years: 17.50    Types: Cigarettes  . Smokeless tobacco: Never Used  Substance and Sexual Activity  . Alcohol use: No    Alcohol/week: 0.0 standard drinks  . Drug use: No  . Sexual activity: Never  Other Topics Concern  . Not on file  Social History Narrative   Married, 3 children.  Two have Beal's syndrome (connective tissue d/o similar to Marfan's).   Orig from [place].  [place] since 86s.   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   Tobacco (current as of 08/2018).   No alcohol.   Social Determinants of Health   Financial Resource Strain:   . Difficulty of Paying Living Expenses: Not on file  Food Insecurity:   . Worried About Charity fundraiser in the Last Year: Not on file  . Ran Out of Food in the Last Year: Not on file  Transportation Needs:   . Lack of Transportation (Medical): Not on file  . Lack of Transportation (Non-Medical): Not on file  Physical Activity:   . Days of Exercise per Week: Not on file  . Minutes of Exercise per Session: Not on file  Stress:   . Feeling of Stress : Not on file  Social Connections:   . Frequency of Communication with Friends and Family: Not on file  . Frequency of Social Gatherings with Friends and Family: Not on file  . Attends Religious Services: Not on file  . Active Member of Clubs or Organizations: Not on file  . Attends Archivist Meetings: Not on file  . Marital Status: Not on file  Intimate Partner Violence:   . Fear of Current or Ex-Partner: Not on file  . Emotionally Abused: Not on file  . Physically Abused: Not on file  . Sexually Abused: Not on file    Outpatient Medications Prior to Visit  Medication Sig Dispense Refill  . amitriptyline (ELAVIL) 25 MG tablet TAKE 1 TABLET BY MOUTH EVERYDAY AT BEDTIME 90 tablet 1  . cholecalciferol (VITAMIN D) 1000 UNITS  tablet Take 1,000 Units by mouth daily.    . DULoxetine (CYMBALTA) 30 MG capsule Take 3 capsules (90 mg total) by mouth daily. 270 capsule 3  . DULoxetine (CYMBALTA) 60 MG capsule Take 60 mg by mouth daily.    Marland Kitchen esomeprazole (NEXIUM) 20 MG capsule Take 1 capsule (20 mg total) by mouth daily at 12 noon. 90 capsule 3  . hydrochlorothiazide (HYDRODIURIL) 25 MG tablet TAKE 1 TABLET BY MOUTH  DAILY 90 tablet 3  . ibuprofen (ADVIL,MOTRIN) 200 MG tablet Take 800 mg by mouth every 6 (six) hours as needed for headache or mild pain.     Marland Kitchen levothyroxine (SYNTHROID) 50 MCG tablet TAKE 1 TABLET BY MOUTH  DAILY BEFORE BREAKFAST 90 tablet 3  . metFORMIN (GLUCOPHAGE) 1000 MG tablet TAKE 1 TABLET BY MOUTH  TWICE DAILY WITH MEALS 180 tablet 3  . metoprolol tartrate (LOPRESSOR) 100 MG tablet TAKE 1 TABLET BY MOUTH  TWICE DAILY 180 tablet 3  . Multiple Vitamins-Minerals (MULTIVITAMIN  WITH MINERALS) tablet Take 1 tablet by mouth daily.    . pravastatin (PRAVACHOL) 40 MG tablet TAKE 1 TABLET BY MOUTH  DAILY 90 tablet 1  . tiZANidine (ZANAFLEX) 4 MG tablet TAKE 1 TABLET BY MOUTH AT  BEDTIME 90 tablet 1  . albuterol (PROVENTIL HFA;VENTOLIN HFA) 108 (90 Base) MCG/ACT inhaler INHALE 2 PUFFS INTO THE LUNGS EVERY 4 HOURS AS NEEDED FOR WHEEZING OR SHORTNESS OF BREATH OR COUGH (Patient not taking: Reported on 09/12/2018) 48 Inhaler 4  . albuterol (PROVENTIL) (2.5 MG/3ML) 0.083% nebulizer solution Take 3 mLs (2.5 mg total) by nebulization every 6 (six) hours as needed for wheezing or shortness of breath. (Patient not taking: Reported on 09/12/2018) 150 mL 1  . Fluticasone-Salmeterol (ADVAIR DISKUS) 100-50 MCG/DOSE AEPB TAKE 1 PUFF BY MOUTH TWICE A DAY (Patient not taking: Reported on 02/10/2019) 180 each 1  . ipratropium (ATROVENT) 0.03 % nasal spray Place 2 sprays into the nose 4 (four) times daily. (Patient not taking: Reported on 02/10/2019) 30 mL 1   No facility-administered medications prior to visit.    Allergies  Allergen  Reactions  . Chantix [Varenicline] Other (See Comments)    "Felt like having mental breakdown"  . Ciprofloxacin Other (See Comments)    Muscle tightness, tendon aching and pain  . Latex Itching  . Penicillins Anaphylaxis and Swelling  . Tamoxifen Other (See Comments)    Blood clots  . Levaquin [Levofloxacin] Other (See Comments)    Severe tendon pain  . Lyrica [Pregabalin] Swelling  . Neurontin [Gabapentin] Swelling  . Morphine And Related Other (See Comments)    MORPHINE DRIP - causes pt to feel irritable and itch  . Ceclor [Cefaclor] Rash  . Other Other (See Comments)    Redness from tape and bandaides  . Sulfa Antibiotics Rash    ROS Review of Systems  Constitutional: Negative for appetite change, chills, fatigue and fever.  HENT: Negative for congestion, dental problem, ear pain and sore throat.   Eyes: Negative for discharge, redness and visual disturbance.  Respiratory: Negative for cough, chest tightness, shortness of breath and wheezing.   Cardiovascular: Negative for chest pain, palpitations and leg swelling.  Gastrointestinal: Negative for abdominal pain, blood in stool, diarrhea, nausea and vomiting.  Genitourinary: Negative for difficulty urinating, dysuria, flank pain, frequency, hematuria and urgency.  Musculoskeletal: Negative for arthralgias, back pain, joint swelling, myalgias and neck stiffness.  Skin: Negative for pallor and rash.  Neurological: Positive for numbness (feet). Negative for dizziness, speech difficulty, weakness and headaches.  Hematological: Negative for adenopathy. Does not bruise/bleed easily.  Psychiatric/Behavioral: Negative for confusion and sleep disturbance. The patient is not nervous/anxious.     PE; Blood pressure 136/78, pulse 77, temperature 97.7 F (36.5 C), temperature source Temporal, resp. rate 16, height 5' 3.5" (1.613 m), weight 228 lb 9.6 oz (103.7 kg), SpO2 100 %. Body mass index is 39.86 kg/m.  Exam chaperoned by  Deveron Furlong, CMA. Gen: Alert, well appearing.  Patient is oriented to person, place, time, and situation. AFFECT: pleasant, lucid thought and speech. ENT: Ears: EACs clear, normal epithelium.  TMs with good light reflex and landmarks bilaterally.  Eyes: no injection, icteris, swelling, or exudate.  EOMI, PERRLA. Nose: no drainage or turbinate edema/swelling.  No injection or focal lesion.  Mouth: lips without lesion/swelling.  Oral mucosa pink and moist.  Dentition intact and without obvious caries or gingival swelling.  Oropharynx without erythema, exudate, or swelling.  Neck: supple/nontender.  No LAD, mass, or TM.  Carotid pulses 2+ bilaterally, without bruits. CV: RRR, no m/r/g.   LUNGS: CTA bilat, nonlabored resps, good aeration in all lung fields. ABD: soft, NT, ND, BS normal.  No hepatospenomegaly or mass.  No bruits. EXT: no clubbing, cyanosis, or edema.  Musculoskeletal: no joint swelling, erythema, warmth, or tenderness.  ROM of all joints intact. Skin - no sores or suspicious lesions or rashes or color changes Foot exam - no swelling, tenderness, or vascular lesions.  A few calluses are present bilat. Color and temperature is normal. Sensation impaired to monofilament testing bilat plantar surfaces. Peripheral pulses are palpable. Toenails are normal.   Pertinent labs:  Lab Results  Component Value Date   TSH 1.88 02/16/2019   Lab Results  Component Value Date   WBC 8.0 08/26/2018   HGB 14.3 08/26/2018   HCT 41.6 08/26/2018   MCV 85.8 08/26/2018   PLT 268 08/26/2018   Lab Results  Component Value Date   CREATININE 0.78 02/16/2019   BUN 14 02/16/2019   NA 139 02/16/2019   K 4.0 02/16/2019   CL 100 02/16/2019   CO2 28 02/16/2019   Lab Results  Component Value Date   ALT 46 (H) 04/09/2018   AST 26 04/09/2018   ALKPHOS 92 04/09/2018   BILITOT 0.3 04/09/2018   Lab Results  Component Value Date   CHOL 134 09/10/2018   Lab Results  Component Value Date    HDL 36.50 (L) 09/10/2018   Lab Results  Component Value Date   LDLCALC 66 09/10/2018   Lab Results  Component Value Date   TRIG 158.0 (H) 09/10/2018   Lab Results  Component Value Date   CHOLHDL 4 09/10/2018   Lab Results  Component Value Date   HGBA1C 6.4 02/16/2019    ASSESSMENT AND PLAN:   1) Uncontrolled HTN: add lisinopril 10mg  qd. Lytes/cr today and repeat at f/u in 2 wks. Review bp/hr avg at f/u 2 wks.  2) Health maintenance exam: Reviewed age and gender appropriate health maintenance issues (prudent diet, regular exercise, health risks of tobacco and excessive alcohol, use of seatbelts, fire alarms in home, use of sunscreen).  Also reviewed age and gender appropriate health screening as well as vaccine recommendations. Vaccines: All UTD except shingrix-->will send rx to pharmacy. Labs: CMET, FLP, A1c, urine microalb/cr, diabetic feet exam. Cervical ca screening: n/a->pt w/hx of hysterectomy for nonmalignant dx. Breast ca screening: n/a-->hx of bilat mastectomy for DCIS 10/2015. Colon ca screening: rpt colonoscopy was due 2017 (Dr. Brodie)-->cologuard NEG 05/2018, plan rpt cologuard 05/2021.Marland Kitchen  An After Visit Summary was printed and given to the patient.  FOLLOW UP:  Return in about 2 weeks (around 06/30/2019) for f/u HTN.  Signed:  Crissie Sickles, MD           06/16/2019

## 2019-06-16 NOTE — Addendum Note (Signed)
Addended by: Deveron Furlong D on: 06/16/2019 11:22 AM   Modules accepted: Orders

## 2019-06-16 NOTE — Patient Instructions (Signed)
We are committed to keeping you informed about the COVID-19 vaccine.  As the vaccine continues to become available for each phase, we will ensure that patients who meet the criteria receive the information they need to access vaccination opportunities. Continue to check your MyChart account and [email or web address] for updates. Please review the Phase 1b information below.  [place] On Tuesday, Jan. 19, the [facility]) and [place] began large-scale COVID-19 vaccinations at the [facility]. The vaccinations are appointment only and for those 42 and older.  Walk-ins will not be accepted.  All appointments are currently filled. Please join our waiting list for the next available appointments. We will contact you when appointments become available. Please do not sign up more than once.  Join Our Waiting List  Our daily vaccination capacity will continue to increase, including expansion of our [employer] site, increasing mobile clinics across our service region and plans to open COVID-19 vaccination clinics in [place] and [place] counties.  Other Vaccination Opportunities in [place] We are also working in partnership with county health agencies in our service counties to ensure continuing vaccination availability in the weeks and months ahead. Learn more about each county's vaccination efforts in the website links below:   [place]'s phase 1b vaccination guidelines, prioritizing those 65 and over as the next eligible group to receive the COVID-19 vaccine, are detailed at [email or web address].   Vaccine Safety and Effectiveness Clinical trials for the Pfizer COVID-19 vaccine involved 42,000 people and showed that the vaccine is more than 95% effective in preventing COVID-19 with no serious  safety concerns. Similar results have been reported for the Moderna COVID-19 vaccine. Side effects reported in the [place] clinical trials include a sore arm at the injection site, fatigue, headache, chills and fever. While side effects from the [place] COVID-19 vaccine are higher than for a typical flu vaccine, they are lower in many ways than side effects from the leading vaccine to prevent shingles. Side effects are signs that a vaccine is working and are related to your immune system being stimulated to produce antibodies against infection. Side effects from vaccination are far less significant than health impacts from COVID-19.  Staying Informed Pharmacists, infectious disease doctors, critical care nurses and other experts at [facility] continue to speak publicly through media interviews and direct communication with our patients and communities about the safety, effectiveness and importance of vaccines to eliminate COVID-19. In addition, reliable information on vaccine safety, effectiveness, side effects and more is available on the following websites:  [place] Department of Health and Human Services COVID-19 Vaccine Information Website.  U.S. Centers for Disease Control and Prevention XX123456 Human resources officer.  Staying Safe We agree with the CDC on what we can do to help our communities get back to normal: Getting "back to normal" is going to take all of our tools. If we use all the tools we have, we stand the best chance of getting our families, communities, schools and workplaces "back to normal" sooner:  Get vaccinated as soon as vaccines become available within the phase of the state's vaccination rollout plan for which you meet the eligibility criteria.  Wear a mask.  Stay 6 feet from others and avoid crowds.  Wash hands often.  For our most current information, please visit [email or web address].

## 2019-06-18 ENCOUNTER — Encounter: Payer: Self-pay | Admitting: Family Medicine

## 2019-06-28 ENCOUNTER — Encounter: Payer: Self-pay | Admitting: Family Medicine

## 2019-06-28 IMAGING — CT CT NECK WITH CONTRAST
4 of 5 series · 15 of 33 positions shown, 17 images · IV contrast (omnipaque)
Comparison: None.

CLINICAL DATA: Left submandibular swelling rule out stone.

EXAM:
CT NECK WITH CONTRAST
TECHNIQUE: Multidetector CT imaging of the neck was performed using the
standard protocol following the bolus administration of intravenous
contrast.
CONTRAST:  75mL OMNIPAQUE IOHEXOL 300 MG/ML  SOLN

[Series 3: axial neck · axial · 0.39mm/px · z∈[-292,-156]mm · 4 of 114 slices shown, 5 images]
[im 23/114  soft-tissue]
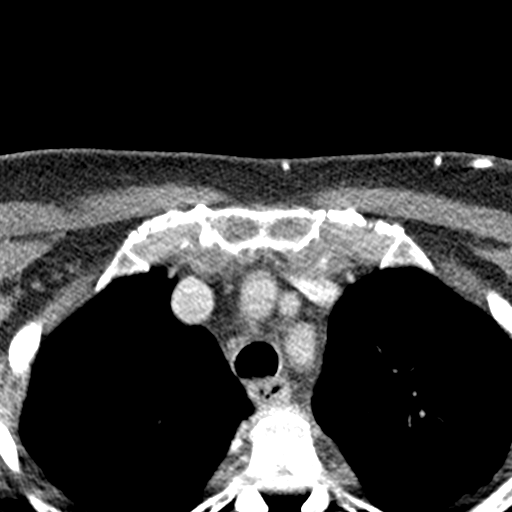
[im 23/114  bone]
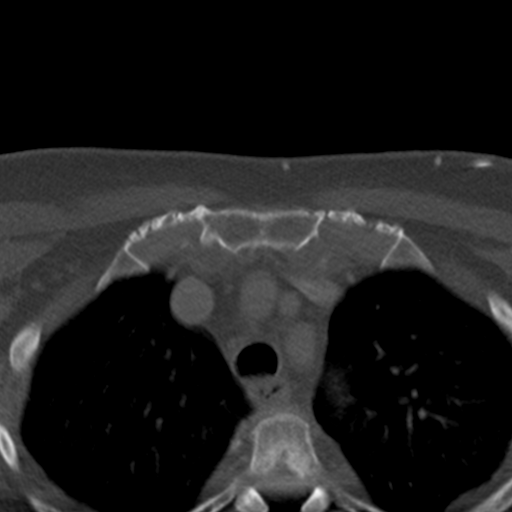
[im 46/114  bone]
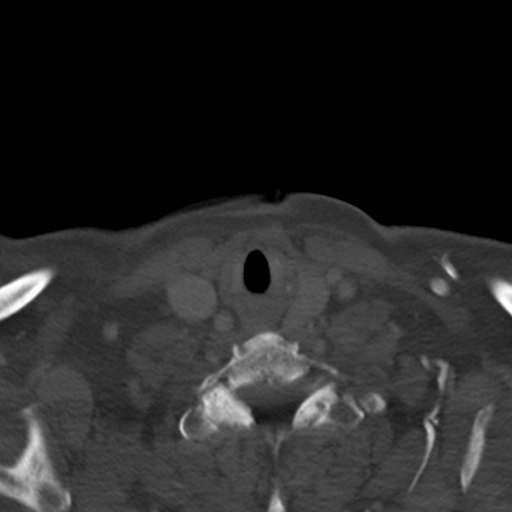
[im 68/114  bone]
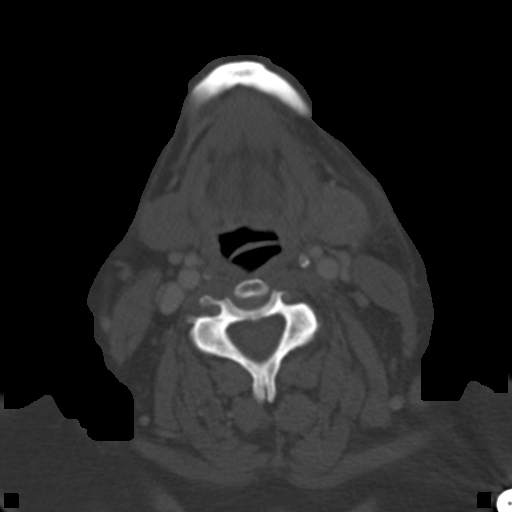
[im 91/114  bone]
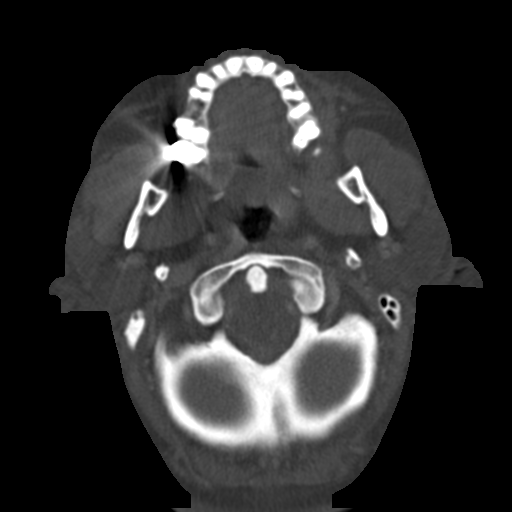

[Series 5: ax · axial · 0.39mm/px · z∈[-314,-222]mm · 3 of 122 slices shown]
[im 25/122  bone]
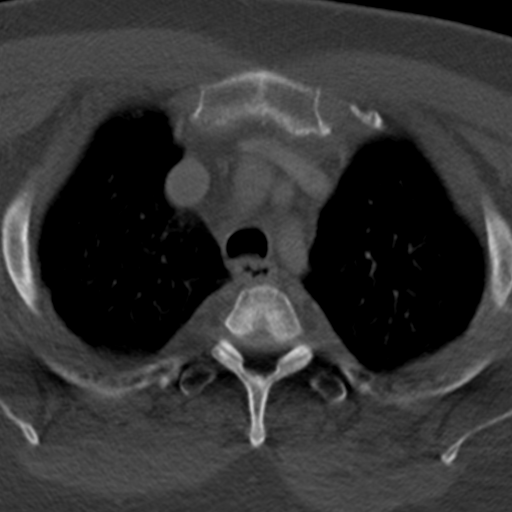
[im 49/122  bone]
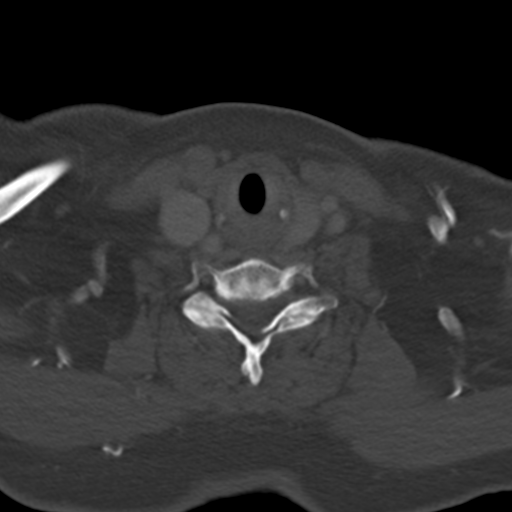
[im 73/122  bone]
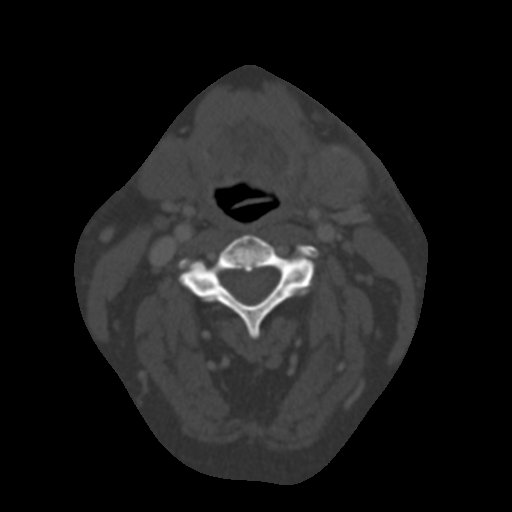

[Series 6: cor neck · coronal · 0.36mm/px · 3 of 101 slices shown]
[im 29/101  bone]
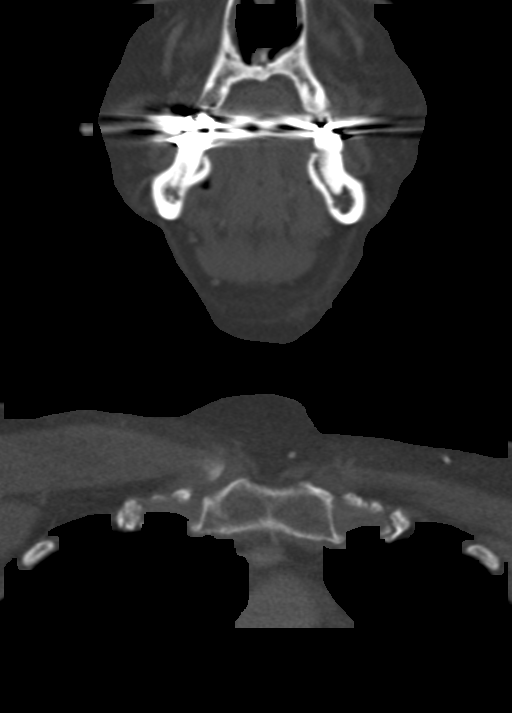
[im 43/101  bone]
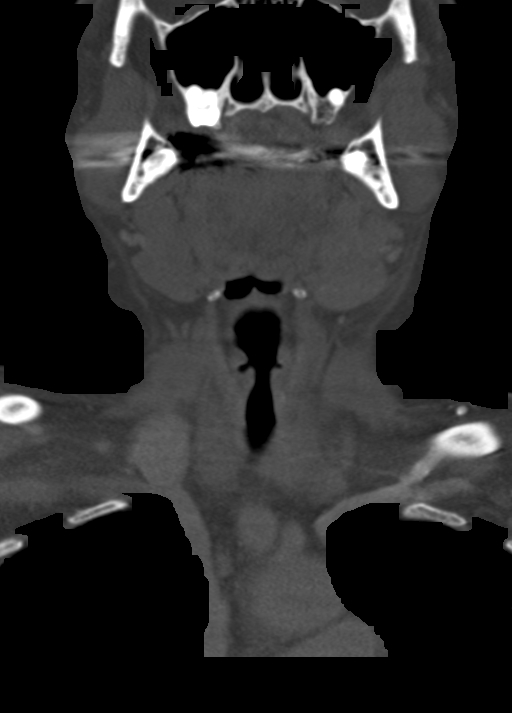
[im 58/101  bone]
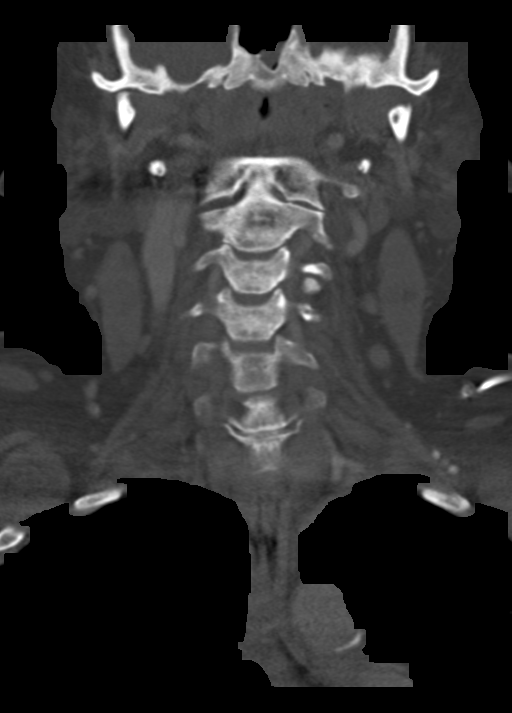

[Series 7: sag neck · sagittal · 0.44mm/px · 5 of 92 slices shown, 6 images]
[im 31/92  bone]
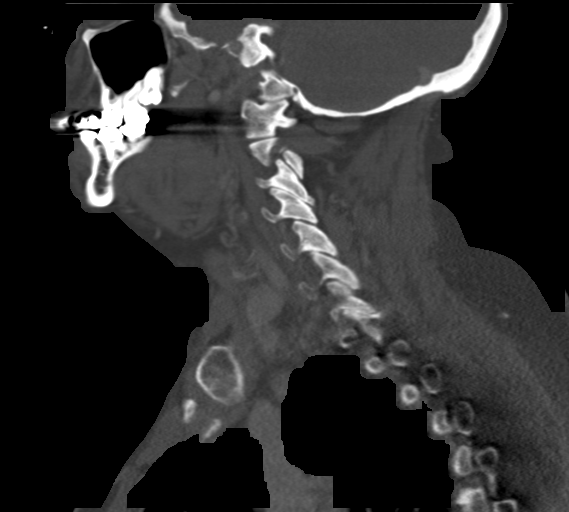
[im 38/92  bone]
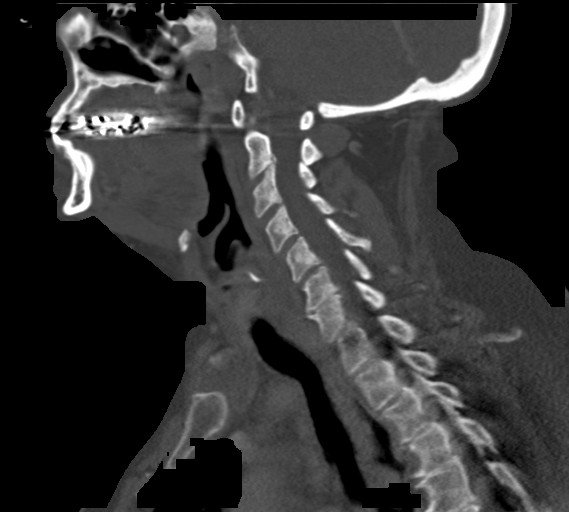
[im 46/92  soft-tissue]
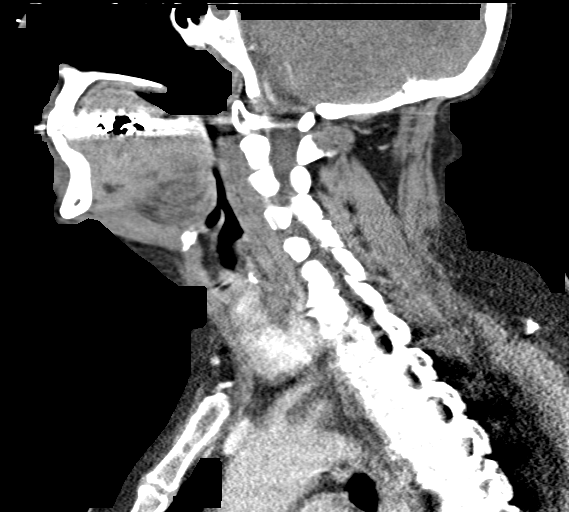
[im 46/92  bone]
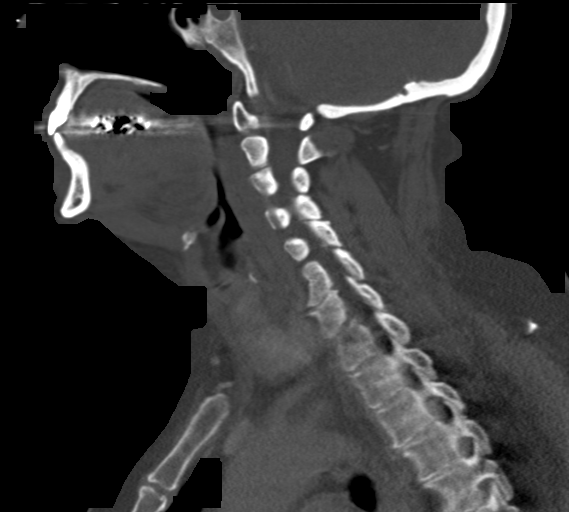
[im 54/92  bone]
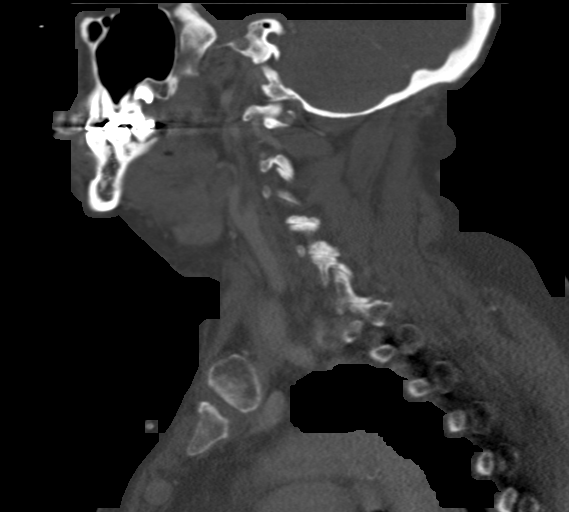
[im 61/92  bone]
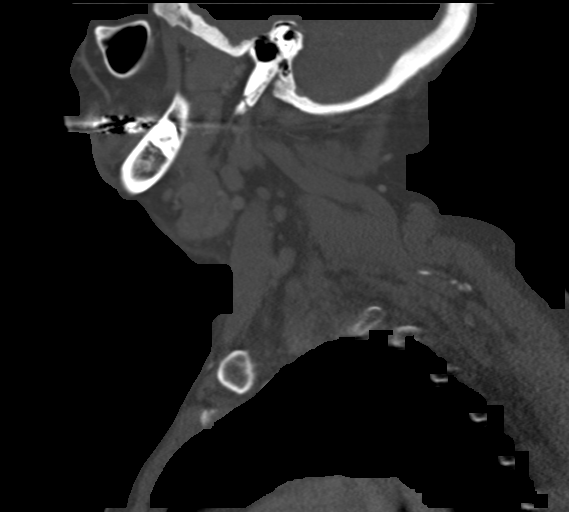

[15 of 33 positions shown; findings below may reference images not displayed]

FINDINGS: Pharynx and larynx: Normal. No mass or swelling.

Salivary glands: Mild enlargement of the left submandibular gland
which shows diffuse hyperenhancement. There is edema in the
surrounding soft tissue and platysmas muscle. No mass or stone
identified.

Right submandibular gland normal.  Parotid normal bilaterally.

Thyroid: Right thyroidectomy.  Normal left thyroid.

Lymph nodes: No enlarged or pathologic lymph nodes. Left level 5
node 7.3 mm. Right level 2 lymph node 9.4 mm. Left level 2 node
mm.

Vascular: Normal vascular enhancement.

Limited intracranial: Negative

Visualized orbits: Negative

Mastoids and visualized paranasal sinuses: Negative

Skeleton: Cervical spondylosis without acute skeletal abnormality.

Upper chest: Negative

Other: None
IMPRESSION: Enlarged inflamed left submandibular gland without mass or stone.
Findings compatible with sialoadenitis which could be a viral or
bacterial infection. No salivary duct calculi.

Right thyroidectomy.  Normal left thyroid

No enlarged lymph nodes in the neck.

## 2019-06-29 ENCOUNTER — Encounter: Payer: Self-pay | Admitting: Family Medicine

## 2019-06-29 NOTE — Telephone Encounter (Signed)
FYI. Please see below.  Pt sent this first MyChart message c/o headaches: Dr. Anitra Lauth, I have been taking my Blood Pressure since my last visit as you asked.  I am scheduled to see you this week.  I cannot remember if I told you at last visit, I have been having headaches in the morning and sometimes lasting a few hours.   You started me on an additional BP Rx called Lisinopril 10 mg.  This morning I have another headache which seems worse and my BP is 153/85 HR was 86.  I took my BP meds about 2 hours ago.  I'm  already on Metoprolol 100 mg two times daily.  On a pain scale, (1-10), this HA is around a 5-6.  Just wanted to let you know.   Second message: Dear Dr.McGowen, I wanted to let you know that yesterday's headache subsided with my taking ibuprofen and by after lunch, my B/P went down to 109/64 and HR 78! Will see you this week. Have a blessed day!

## 2019-06-29 NOTE — Telephone Encounter (Signed)
OK, nothing new at this time. Will see you this week.

## 2019-07-01 ENCOUNTER — Ambulatory Visit (INDEPENDENT_AMBULATORY_CARE_PROVIDER_SITE_OTHER): Payer: Medicare Other | Admitting: Family Medicine

## 2019-07-01 ENCOUNTER — Other Ambulatory Visit: Payer: Self-pay

## 2019-07-01 ENCOUNTER — Encounter: Payer: Self-pay | Admitting: Family Medicine

## 2019-07-01 VITALS — BP 134/77 | HR 66 | Temp 98.2°F | Resp 16 | Ht 63.5 in | Wt 226.2 lb

## 2019-07-01 DIAGNOSIS — Z9989 Dependence on other enabling machines and devices: Secondary | ICD-10-CM

## 2019-07-01 DIAGNOSIS — G4733 Obstructive sleep apnea (adult) (pediatric): Secondary | ICD-10-CM

## 2019-07-01 DIAGNOSIS — R519 Headache, unspecified: Secondary | ICD-10-CM | POA: Diagnosis not present

## 2019-07-01 DIAGNOSIS — G4700 Insomnia, unspecified: Secondary | ICD-10-CM

## 2019-07-01 DIAGNOSIS — I10 Essential (primary) hypertension: Secondary | ICD-10-CM

## 2019-07-01 DIAGNOSIS — E1142 Type 2 diabetes mellitus with diabetic polyneuropathy: Secondary | ICD-10-CM

## 2019-07-01 LAB — BASIC METABOLIC PANEL
BUN: 13 mg/dL (ref 6–23)
CO2: 30 mEq/L (ref 19–32)
Calcium: 9.9 mg/dL (ref 8.4–10.5)
Chloride: 99 mEq/L (ref 96–112)
Creatinine, Ser: 0.75 mg/dL (ref 0.40–1.20)
GFR: 76.8 mL/min (ref >60.00)
Glucose, Bld: 93 mg/dL (ref 70–99)
Potassium: 4.5 mEq/L (ref 3.5–5.1)
Sodium: 139 mEq/L (ref 135–145)

## 2019-07-01 MED ORDER — AMITRIPTYLINE HCL 25 MG PO TABS: ORAL_TABLET | ORAL | 3 refills | Status: DC

## 2019-07-01 MED ORDER — LISINOPRIL 10 MG PO TABS: 10.0000 mg | ORAL_TABLET | Freq: Every day | ORAL | 0 refills | Status: DC

## 2019-07-01 MED ORDER — AMITRIPTYLINE HCL 25 MG PO TABS: ORAL_TABLET | ORAL | 0 refills | Status: DC

## 2019-07-01 NOTE — Progress Notes (Signed)
OFFICE VISIT  07/01/2019   CC:  Chief Complaint  Patient presents with  . Follow-up    hypertension, pt is not fasting   HPI:    Patient is a 69 y.o. Caucasian female who presents for 2 wk f/u uncontrolled HTN. A/P as of last visit: "1) Uncontrolled HTN: add lisinopril 10mg  qd. Lytes/cr today and repeat at f/u in 2 wks. Review bp/hr avg at f/u 2 wks.  2) Health maintenance exam: Reviewed age and gender appropriate health maintenance issues (prudent diet, regular exercise, health risks of tobacco and excessive alcohol, use of seatbelts, fire alarms in home, use of sunscreen).  Also reviewed age and gender appropriate health screening as well as vaccine recommendations. Vaccines: All UTD except shingrix-->will send rx to pharmacy. Labs: CMET, FLP, A1c, urine microalb/cr, diabetic feet exam. Cervical ca screening: n/a->pt w/hx of hysterectomy for nonmalignant dx. Breast ca screening: n/a-->hx of bilat mastectomy for DCIS 10/2015. Colon ca screening: rpt colonoscopy was due 2017 (Dr. Brodie)-->cologuard NEG 05/2018, plan rpt cologuard 05/2021."  Interim hx: Reviewed home bp's from the last 7d: avg 145/80.  Lowest 109/64, highest 153 syst, 87 diast.  HR avg 78. No side effect from meds. Waking up with some HAs that are moderate intensity, frontal/retro-orbital.  No nausea or vision sx's.  About the last 2 wks almost daily. Advil has helped.  Has OSA and is on CPAP, has very old machine.  Husband has not commented on any apneic evens but she does sometimes wake up with a gasp and finds it hard to return to sleep. Last 2 wks or so has been happening nightly, hard to go back to sleep b/c wakes up so alert.  Gets OOB and knits every time.  Doesn't go back to sleep at all. Denies excessive daytime sleepiness. She takes amitriptyline 25mg  qhs, long term, still with some neuropathy sx's on this dosing.  Past Medical History:  Diagnosis Date  . Anxiety and depression   . Atypical chest pain  2016   ruled out for MI, stress test showed no ischemia.  CT angio NO PE.  . Cellulitis of chest wall 12/09/2015   s/p breast surgery  . Colon cancer screening 05/11/2018   2007 colonoscopy normal.  Cologuard neg 05/11/18; repeat 3 yrs.  Marland Kitchen COPD (chronic obstructive pulmonary disease) (HCC)    mild; prn albut per Dr. Lamonte Sakai as of 2017--no f/u w/pulm since then.  . DDD (degenerative disc disease)   . Diabetes mellitus ([place])    w/DPN  . Diabetic peripheral neuropathy ([place])   . DJD (degenerative joint disease)   . Fatty liver disease, nonalcoholic   . Fibromyalgia    cymbalta  . GERD (gastroesophageal reflux disease)   . History of blood clots 2004   during cancer treatment  . History of breast cancer 2017   DCIS->bilat mastectomy (Sister had breast cancer in her 75s). Last seen by onc not long after surgery, no further tx or onc f/u needed.  Marland Kitchen History of latent tuberculosis    got approp tx x 9 mo  . History of palpitations 2016   sinus tachycardia.  30 day event monitor was normal (Dr. Irish Lack)  . History of pneumonia   . HTN (hypertension) 01/28/2012  . Hyperlipidemia 01/28/2012  . Hypothyroidism 02/14/2014  . Lipoma 01/05/2018  . Mediastinal adenopathy 2016   on CT angio done for atypical CP.  Has FH of NHL in sister. Pulm saw her 01/2016 and recommended a f/u CT 09/2016 but this doesn't  appear to have been done.  Rpt CT 02/2019->resolved.  . Nasal polyp 08/30/2012  . Obesity   . OSA on CPAP   . Osteoarthritis   . Osteopenia   . Peripheral vascular disease ([place]) 04   axillary,upper chest after 3 weeks on tamoxifen  . Sialoadenitis of submandibular gland 08/27/2018   LEFT. doxy rx'd by ED. ENT f/u the next day->reassured, dx'd with partial left submandibular gland obstruction w/out sign of infxn; f/u prn.  . Tobacco abuse-unspec 10/12/2013    Past Surgical History:  Procedure Laterality Date  . ABDOMINAL HYSTERECTOMY  1991  . APPENDECTOMY  91  . BILATERAL OOPHORECTOMY  01/2003   . BREAST SURGERY Left 2004   lumpectomy  . CARPAL TUNNEL RELEASE     right  . COLONOSCOPY  10/15/2005   Dr. Bronson Ing, poor prep  . INCISION AND DRAINAGE ABSCESS Left 12/13/2015   Procedure: DRAINAGE LEFT MASTECTOMY WOUND INFECTION;  Surgeon: Fanny Skates, MD;  Location: [place];  Service: General;  Laterality: Left;  Marland Kitchen MASTECTOMY W/ SENTINEL NODE BIOPSY Bilateral 10/26/2015   Procedure: RIGHT TOTAL MASTECTOMY WITH RIGHT SENTINEL LYMPH NODE BIOPSY, INJECT BLUE DYE RIGHT BREAST, LEFT BREAST PROPHYLACTIC MASTECTOMY;  Surgeon: Fanny Skates, MD;  Location: [place];  Service: General;  Laterality: Bilateral;  . NASAL SEPTUM SURGERY  08  . PILONIDAL CYST EXCISION    . THYROIDECTOMY, PARTIAL  mid 80's   right side  . TOTAL KNEE ARTHROPLASTY Right   . TRANSTHORACIC ECHOCARDIOGRAM  05/25/2008   ? mild increase in LV filling pressure, EF 65%, normal LV function/wall motion, no LVH or ventricular enlargement.    Outpatient Medications Prior to Visit  Medication Sig Dispense Refill  . cholecalciferol (VITAMIN D) 1000 UNITS tablet Take 1,000 Units by mouth daily.    . DULoxetine (CYMBALTA) 30 MG capsule Take 3 capsules (90 mg total) by mouth daily. 270 capsule 3  . DULoxetine (CYMBALTA) 60 MG capsule Take 60 mg by mouth daily.    Marland Kitchen esomeprazole (NEXIUM) 20 MG capsule Take 1 capsule (20 mg total) by mouth daily at 12 noon. 90 capsule 3  . hydrochlorothiazide (HYDRODIURIL) 25 MG tablet TAKE 1 TABLET BY MOUTH  DAILY 90 tablet 3  . ibuprofen (ADVIL,MOTRIN) 200 MG tablet Take 800 mg by mouth every 6 (six) hours as needed for headache or mild pain.     Marland Kitchen levothyroxine (SYNTHROID) 50 MCG tablet TAKE 1 TABLET BY MOUTH  DAILY BEFORE BREAKFAST 90 tablet 3  . metFORMIN (GLUCOPHAGE) 1000 MG tablet TAKE 1 TABLET BY MOUTH  TWICE DAILY WITH MEALS 180 tablet 3  . metoprolol tartrate (LOPRESSOR) 100 MG tablet TAKE 1 TABLET BY MOUTH  TWICE DAILY 180 tablet 3  . Multiple Vitamins-Minerals (MULTIVITAMIN WITH  MINERALS) tablet Take 1 tablet by mouth daily.    . pravastatin (PRAVACHOL) 40 MG tablet TAKE 1 TABLET BY MOUTH  DAILY 90 tablet 1  . tiZANidine (ZANAFLEX) 4 MG tablet TAKE 1 TABLET BY MOUTH AT  BEDTIME 90 tablet 1  . amitriptyline (ELAVIL) 25 MG tablet TAKE 1 TABLET BY MOUTH EVERYDAY AT BEDTIME 90 tablet 1  . lisinopril (ZESTRIL) 10 MG tablet Take 1 tablet (10 mg total) by mouth daily. 30 tablet 0  . albuterol (PROVENTIL HFA;VENTOLIN HFA) 108 (90 Base) MCG/ACT inhaler INHALE 2 PUFFS INTO THE LUNGS EVERY 4 HOURS AS NEEDED FOR WHEEZING OR SHORTNESS OF BREATH OR COUGH (Patient not taking: Reported on 09/12/2018) 48 Inhaler 4  . albuterol (PROVENTIL) (2.5 MG/3ML) 0.083% nebulizer solution  Take 3 mLs (2.5 mg total) by nebulization every 6 (six) hours as needed for wheezing or shortness of breath. (Patient not taking: Reported on 09/12/2018) 150 mL 1  . Fluticasone-Salmeterol (ADVAIR DISKUS) 100-50 MCG/DOSE AEPB TAKE 1 PUFF BY MOUTH TWICE A DAY (Patient not taking: Reported on 02/10/2019) 180 each 1  . ipratropium (ATROVENT) 0.03 % nasal spray Place 2 sprays into the nose 4 (four) times daily. (Patient not taking: Reported on 02/10/2019) 30 mL 1   No facility-administered medications prior to visit.    Allergies  Allergen Reactions  . Chantix [Varenicline] Other (See Comments)    "Felt like having mental breakdown"  . Ciprofloxacin Other (See Comments)    Muscle tightness, tendon aching and pain  . Latex Itching  . Penicillins Anaphylaxis and Swelling  . Tamoxifen Other (See Comments)    Blood clots  . Levaquin [Levofloxacin] Other (See Comments)    Severe tendon pain  . Lyrica [Pregabalin] Swelling  . Neurontin [Gabapentin] Swelling  . Morphine And Related Other (See Comments)    MORPHINE DRIP - causes pt to feel irritable and itch  . Ceclor [Cefaclor] Rash  . Other Other (See Comments)    Redness from tape and bandaides  . Sulfa Antibiotics Rash    ROS As per HPI  PE: Blood pressure  134/77, pulse 66, temperature 98.2 F (36.8 C), temperature source Temporal, resp. rate 16, height 5' 3.5" (1.613 m), weight 226 lb 3.2 oz (102.6 kg), SpO2 100 %. Gen: Alert, well appearing.  Patient is oriented to person, place, time, and situation. AFFECT: pleasant, lucid thought and speech. No further exam today.  LABS:    Chemistry      Component Value Date/Time   NA 138 06/16/2019 0904   NA 141 04/09/2018 1545   NA 141 09/15/2015 1035   K 3.9 06/16/2019 0904   K 4.0 09/15/2015 1035   CL 99 06/16/2019 0904   CO2 31 06/16/2019 0904   CO2 30 (H) 09/15/2015 1035   BUN 16 06/16/2019 0904   BUN 12 04/09/2018 1545   BUN 15.5 09/15/2015 1035   CREATININE 0.84 06/16/2019 0904   CREATININE 0.91 01/25/2016 1011   CREATININE 0.9 09/15/2015 1035      Component Value Date/Time   CALCIUM 9.3 06/16/2019 0904   CALCIUM 9.8 09/15/2015 1035   ALKPHOS 62 06/16/2019 0904   ALKPHOS 60 09/15/2015 1035   AST 19 06/16/2019 0904   AST 35 (H) 09/15/2015 1035   ALT 33 06/16/2019 0904   ALT 53 09/15/2015 1035   BILITOT 0.4 06/16/2019 0904   BILITOT 0.3 04/09/2018 1545   BILITOT 0.31 09/15/2015 1035       IMPRESSION AND PLAN:  1) HTN, not well controlled. No signif improvement on lisinopril 10mg , but not clear whether home bp's are spuriously elevated due to bp cuff being too small.  BP almost normal here today. Will continue 10mg  lisino and hctz 25 and she'll get a large bp cuff and continue monitoring and we'll adjust lisinopril dose in 1 mo if needed.  BMET today.  2) OSA on CPAP: question of adequacy of CPAP at this time. Having some awakenings suspicious for apneic events + morning HAs lately. Will ask sleep specialist/pulm to see her.  3) Maintenance insomnia: ? From apnea.  Encouraged pt to STAY IN BED when this occurs, do not get up and knit or do anything stimulating.  Also, since her neuropathic sx's don't seem ideally controlled on amitriptyline 25mg  qhs she  can increase to TWO  of these tabs qhs and see how this helps everything.  An After Visit Summary was printed and given to the patient.  FOLLOW UP: Return in about 4 weeks (around 07/29/2019) for routine chronic illness f/u.  Signed:  Crissie Sickles, MD           07/01/2019

## 2019-07-01 NOTE — Patient Instructions (Signed)
Get larger bp cuff and continue checking blood pressure and heart rate once daily. Take TWO of your amitriptyline every night to help with sleep and with your neuropathy symptoms. Continue taking lisinopril 10mg  once a day. Follow up in office 1 month.

## 2019-07-08 ENCOUNTER — Other Ambulatory Visit: Payer: Self-pay | Admitting: Family Medicine

## 2019-07-25 ENCOUNTER — Other Ambulatory Visit: Payer: Self-pay | Admitting: Family Medicine

## 2019-07-27 NOTE — Telephone Encounter (Signed)
RF request for Amitriptyline LOV:07/01/19 Next ov: 07/29/19 Last written:07/01/19(60,0) 1-2 tabs qhs  Please advise. Medication pending for 90 d/s with 1 refill.

## 2019-07-28 ENCOUNTER — Other Ambulatory Visit: Payer: Self-pay

## 2019-07-29 ENCOUNTER — Ambulatory Visit: Payer: Medicare Other | Admitting: Family Medicine

## 2019-07-29 NOTE — Telephone Encounter (Signed)
Help. I want to authorize this rx but it is giving me some confusing message about the pharmacy change blah blah, I couldn't fix it.

## 2019-07-29 NOTE — Progress Notes (Deleted)
OFFICE VISIT  07/29/2019   CC: No chief complaint on file.    HPI:    Patient is a 69 y.o. Caucasian female who presents for 1 mo f/u  A/P as of last visit: "1) HTN, not well controlled. No signif improvement on lisinopril 10mg , but not clear whether home bp's are spuriously elevated due to bp cuff being too small.  BP almost normal here today. Will continue 10mg  lisino and hctz 25 and she'll get a large bp cuff and continue monitoring and we'll adjust lisinopril dose in 1 mo if needed.  BMET today.  2) OSA on CPAP: question of adequacy of CPAP at this time. Having some awakenings suspicious for apneic events + morning HAs lately. Will ask sleep specialist/pulm to see her.  3) Maintenance insomnia: ? From apnea.  Encouraged pt to STAY IN BED when this occurs, do not get up and knit or do anything stimulating.  Also, since her neuropathic sx's don't seem ideally controlled on amitriptyline 25mg  qhs she can increase to TWO of these tabs qhs and see how this helps everything.    Past Medical History:  Diagnosis Date  . Anxiety and depression   . Atypical chest pain 2016   ruled out for MI, stress test showed no ischemia.  CT angio NO PE.  . Cellulitis of chest wall 12/09/2015   s/p breast surgery  . Colon cancer screening 05/11/2018   2007 colonoscopy normal.  Cologuard neg 05/11/18; repeat 3 yrs.  Marland Kitchen COPD (chronic obstructive pulmonary disease) (HCC)    mild; prn albut per Dr. Lamonte Sakai as of 2017--no f/u w/pulm since then.  . DDD (degenerative disc disease)   . Diabetes mellitus ([place])    w/DPN  . Diabetic peripheral neuropathy ([place])   . DJD (degenerative joint disease)   . Fatty liver disease, nonalcoholic   . Fibromyalgia    cymbalta  . GERD (gastroesophageal reflux disease)   . History of blood clots 2004   during cancer treatment  . History of breast cancer 2017   DCIS->bilat mastectomy (Sister had breast cancer in her 63s). Last seen by onc not long after surgery, no  further tx or onc f/u needed.  Marland Kitchen History of latent tuberculosis    got approp tx x 9 mo  . History of palpitations 2016   sinus tachycardia.  30 day event monitor was normal (Dr. Irish Lack)  . History of pneumonia   . HTN (hypertension) 01/28/2012  . Hyperlipidemia 01/28/2012  . Hypothyroidism 02/14/2014  . Lipoma 01/05/2018  . Mediastinal adenopathy 2016   on CT angio done for atypical CP.  Has FH of NHL in sister. Pulm saw her 01/2016 and recommended a f/u CT 09/2016 but this doesn't appear to have been done.  Rpt CT 02/2019->resolved.  . Nasal polyp 08/30/2012  . Obesity   . OSA on CPAP   . Osteoarthritis   . Osteopenia   . Peripheral vascular disease ([place]) 04   axillary,upper chest after 3 weeks on tamoxifen  . Sialoadenitis of submandibular gland 08/27/2018   LEFT. doxy rx'd by ED. ENT f/u the next day->reassured, dx'd with partial left submandibular gland obstruction w/out sign of infxn; f/u prn.  . Tobacco abuse-unspec 10/12/2013    Past Surgical History:  Procedure Laterality Date  . ABDOMINAL HYSTERECTOMY  1991  . APPENDECTOMY  91  . BILATERAL OOPHORECTOMY  01/2003  . BREAST SURGERY Left 2004   lumpectomy  . CARPAL TUNNEL RELEASE     right  .  COLONOSCOPY  10/15/2005   Dr. Bronson Ing, poor prep  . INCISION AND DRAINAGE ABSCESS Left 12/13/2015   Procedure: DRAINAGE LEFT MASTECTOMY WOUND INFECTION;  Surgeon: Fanny Skates, MD;  Location: [facility];  Service: General;  Laterality: Left;  Marland Kitchen MASTECTOMY W/ SENTINEL NODE BIOPSY Bilateral 10/26/2015   Procedure: RIGHT TOTAL MASTECTOMY WITH RIGHT SENTINEL LYMPH NODE BIOPSY, INJECT BLUE DYE RIGHT BREAST, LEFT BREAST PROPHYLACTIC MASTECTOMY;  Surgeon: Fanny Skates, MD;  Location: [facility];  Service: General;  Laterality: Bilateral;  . NASAL SEPTUM SURGERY  08  . PILONIDAL CYST EXCISION    . THYROIDECTOMY, PARTIAL  mid 80's   right side  . TOTAL KNEE ARTHROPLASTY Right   . TRANSTHORACIC ECHOCARDIOGRAM  05/25/2008   ? mild increase in LV  filling pressure, EF 65%, normal LV function/wall motion, no LVH or ventricular enlargement.    Outpatient Medications Prior to Visit  Medication Sig Dispense Refill  . albuterol (PROVENTIL HFA;VENTOLIN HFA) 108 (90 Base) MCG/ACT inhaler INHALE 2 PUFFS INTO THE LUNGS EVERY 4 HOURS AS NEEDED FOR WHEEZING OR SHORTNESS OF BREATH OR COUGH (Patient not taking: Reported on 09/12/2018) 48 Inhaler 4  . albuterol (PROVENTIL) (2.5 MG/3ML) 0.083% nebulizer solution Take 3 mLs (2.5 mg total) by nebulization every 6 (six) hours as needed for wheezing or shortness of breath. (Patient not taking: Reported on 09/12/2018) 150 mL 1  . amitriptyline (ELAVIL) 25 MG tablet 1-2 qhs 60 tablet 0  . cholecalciferol (VITAMIN D) 1000 UNITS tablet Take 1,000 Units by mouth daily.    . DULoxetine (CYMBALTA) 30 MG capsule Take 3 capsules (90 mg total) by mouth daily. 270 capsule 3  . DULoxetine (CYMBALTA) 60 MG capsule Take 60 mg by mouth daily.    Marland Kitchen esomeprazole (NEXIUM) 20 MG capsule Take 1 capsule (20 mg total) by mouth daily at 12 noon. 90 capsule 3  . Fluticasone-Salmeterol (ADVAIR DISKUS) 100-50 MCG/DOSE AEPB TAKE 1 PUFF BY MOUTH TWICE A DAY (Patient not taking: Reported on 02/10/2019) 180 each 1  . hydrochlorothiazide (HYDRODIURIL) 25 MG tablet TAKE 1 TABLET BY MOUTH  DAILY 90 tablet 3  . ibuprofen (ADVIL,MOTRIN) 200 MG tablet Take 800 mg by mouth every 6 (six) hours as needed for headache or mild pain.     Marland Kitchen ipratropium (ATROVENT) 0.03 % nasal spray Place 2 sprays into the nose 4 (four) times daily. (Patient not taking: Reported on 02/10/2019) 30 mL 1  . levothyroxine (SYNTHROID) 50 MCG tablet TAKE 1 TABLET BY MOUTH  DAILY BEFORE BREAKFAST 90 tablet 3  . lisinopril (ZESTRIL) 10 MG tablet TAKE 1 TABLET BY MOUTH EVERY DAY 15 tablet 0  . metFORMIN (GLUCOPHAGE) 1000 MG tablet TAKE 1 TABLET BY MOUTH  TWICE DAILY WITH MEALS 180 tablet 3  . metoprolol tartrate (LOPRESSOR) 100 MG tablet TAKE 1 TABLET BY MOUTH  TWICE DAILY 180 tablet  3  . Multiple Vitamins-Minerals (MULTIVITAMIN WITH MINERALS) tablet Take 1 tablet by mouth daily.    . pravastatin (PRAVACHOL) 40 MG tablet TAKE 1 TABLET BY MOUTH  DAILY 90 tablet 1  . tiZANidine (ZANAFLEX) 4 MG tablet TAKE 1 TABLET BY MOUTH AT  BEDTIME 90 tablet 1   No facility-administered medications prior to visit.    Allergies  Allergen Reactions  . Chantix [Varenicline] Other (See Comments)    "Felt like having mental breakdown"  . Ciprofloxacin Other (See Comments)    Muscle tightness, tendon aching and pain  . Latex Itching  . Penicillins Anaphylaxis and Swelling  . Tamoxifen Other (See  Comments)    Blood clots  . Levaquin [Levofloxacin] Other (See Comments)    Severe tendon pain  . Lyrica [Pregabalin] Swelling  . Neurontin [Gabapentin] Swelling  . Morphine And Related Other (See Comments)    MORPHINE DRIP - causes pt to feel irritable and itch  . Ceclor [Cefaclor] Rash  . Other Other (See Comments)    Redness from tape and bandaides  . Sulfa Antibiotics Rash    ROS As per HPI  PE: There were no vitals taken for this visit. ***  LABS:    Chemistry      Component Value Date/Time   NA 139 07/01/2019 0958   NA 141 04/09/2018 1545   NA 141 09/15/2015 1035   K 4.5 07/01/2019 0958   K 4.0 09/15/2015 1035   CL 99 07/01/2019 0958   CO2 30 07/01/2019 0958   CO2 30 (H) 09/15/2015 1035   BUN 13 07/01/2019 0958   BUN 12 04/09/2018 1545   BUN 15.5 09/15/2015 1035   CREATININE 0.75 07/01/2019 0958   CREATININE 0.91 01/25/2016 1011   CREATININE 0.9 09/15/2015 1035      Component Value Date/Time   CALCIUM 9.9 07/01/2019 0958   CALCIUM 9.8 09/15/2015 1035   ALKPHOS 62 06/16/2019 0904   ALKPHOS 60 09/15/2015 1035   AST 19 06/16/2019 0904   AST 35 (H) 09/15/2015 1035   ALT 33 06/16/2019 0904   ALT 53 09/15/2015 1035   BILITOT 0.4 06/16/2019 0904   BILITOT 0.3 04/09/2018 1545   BILITOT 0.31 09/15/2015 1035     Lab Results  Component Value Date   WBC 8.0  08/26/2018   HGB 14.3 08/26/2018   HCT 41.6 08/26/2018   MCV 85.8 08/26/2018   PLT 268 08/26/2018   Lab Results  Component Value Date   HGBA1C 6.5 06/16/2019    IMPRESSION AND PLAN:  No problem-specific Assessment & Plan notes found for this encounter.   An After Visit Summary was printed and given to the patient.  FOLLOW UP: No follow-ups on file. 6-8 wk f/u DM, etc?  Signed:  Crissie Sickles, MD           07/29/2019

## 2019-07-30 ENCOUNTER — Other Ambulatory Visit: Payer: Self-pay

## 2019-07-30 MED ORDER — AMITRIPTYLINE HCL 25 MG PO TABS: ORAL_TABLET | ORAL | 3 refills | Status: DC

## 2019-07-30 NOTE — Telephone Encounter (Signed)
RF request for Amitriptyline LOV:07/01/19 Next ov: 07/29/19 Last written:07/01/19(60,0) 1-2 tabs qhs  Please advise. Medication pending for 90 d/s with 1 refill. Other request denied. Pt would like to use [facility], [facility].

## 2019-08-02 ENCOUNTER — Other Ambulatory Visit: Payer: Self-pay | Admitting: Family Medicine

## 2019-08-05 ENCOUNTER — Other Ambulatory Visit: Payer: Self-pay | Admitting: Family Medicine

## 2019-08-05 ENCOUNTER — Encounter: Payer: Self-pay | Admitting: Family Medicine

## 2019-08-05 ENCOUNTER — Other Ambulatory Visit: Payer: Self-pay

## 2019-08-05 ENCOUNTER — Ambulatory Visit (INDEPENDENT_AMBULATORY_CARE_PROVIDER_SITE_OTHER): Payer: Medicare Other | Admitting: Family Medicine

## 2019-08-05 VITALS — BP 113/72 | HR 89 | Temp 98.0°F | Resp 16 | Ht 63.5 in | Wt 228.8 lb

## 2019-08-05 DIAGNOSIS — G4700 Insomnia, unspecified: Secondary | ICD-10-CM | POA: Diagnosis not present

## 2019-08-05 DIAGNOSIS — E1142 Type 2 diabetes mellitus with diabetic polyneuropathy: Secondary | ICD-10-CM | POA: Diagnosis not present

## 2019-08-05 DIAGNOSIS — J309 Allergic rhinitis, unspecified: Secondary | ICD-10-CM | POA: Diagnosis not present

## 2019-08-05 DIAGNOSIS — G4733 Obstructive sleep apnea (adult) (pediatric): Secondary | ICD-10-CM

## 2019-08-05 DIAGNOSIS — I10 Essential (primary) hypertension: Secondary | ICD-10-CM

## 2019-08-05 MED ORDER — LISINOPRIL 10 MG PO TABS: 10.0000 mg | ORAL_TABLET | Freq: Every day | ORAL | 0 refills | Status: DC

## 2019-08-05 NOTE — Patient Instructions (Signed)
Antihistamines otc to try (all have generic forms) Zyrtec Allegra Claritin Xyzal

## 2019-08-05 NOTE — Progress Notes (Signed)
OFFICE VISIT  08/05/2019   CC:  Chief Complaint  Patient presents with  . Follow-up    hypertension, pt is not fasting     HPI:    Patient is a 69 y.o. Caucasian female who presents for 5 week f/u uncontrolled HTN,   A/P as of last visit: "1) HTN, not well controlled. No signif improvement on lisinopril 10mg , but not clear whether home bp's are spuriously elevated due to bp cuff being too small.  BP almost normal here today. Will continue 10mg  lisino and hctz 25 and she'll get a large bp cuff and continue monitoring and we'll adjust lisinopril dose in 1 mo if needed.  BMET today.  2) OSA on CPAP: question of adequacy of CPAP at this time. Having some awakenings suspicious for apneic events + morning HAs lately. Will ask sleep specialist/pulm to see her.  3) Maintenance insomnia: ? From apnea.  Encouraged pt to STAY IN BED when this occurs, do not get up and knit or do anything stimulating.  Also, since her neuropathic sx's don't seem ideally controlled on amitriptyline 25mg  qhs she can increase to TWO of these tabs qhs and see how this helps everything."  INTERIM HX:  HTN: home bp's reviewed today->avg low 120s over 70s. HR avg 70s. Started using larger cuff for bp checks.  Insomnia: doing great now.  Taking 2 amitriptyline helps wonderfully and it is also helping her periph neurop sx's well!  Having some seasonal allergy sx's lately.  Nasal irritation and congestion, some PND.  No sneezing or HAs or signif coughing.  No wheezing or SOB lately.  Not taking advair lately and doing fine.  Has albuterol and does not use this lately at all. All of this started yesterday.  No f/c/m. She has had both covid vaccines.     Past Medical History:  Diagnosis Date  . Anxiety and depression   . Atypical chest pain 2016   ruled out for MI, stress test showed no ischemia.  CT angio NO PE.  . Cellulitis of chest wall 12/09/2015   s/p breast surgery  . Colon cancer screening  05/11/2018   2007 colonoscopy normal.  Cologuard neg 05/11/18; repeat 3 yrs.  Marland Kitchen COPD (chronic obstructive pulmonary disease) (HCC)    mild; prn albut per Dr. Lamonte Sakai as of 2017--no f/u w/pulm since then.  . DDD (degenerative disc disease)   . Diabetes mellitus ([place])    w/DPN  . Diabetic peripheral neuropathy ([place])   . DJD (degenerative joint disease)   . Fatty liver disease, nonalcoholic   . Fibromyalgia    cymbalta  . GERD (gastroesophageal reflux disease)   . History of blood clots 2004   during cancer treatment  . History of breast cancer 2017   DCIS->bilat mastectomy (Sister had breast cancer in her 67s). Last seen by onc not long after surgery, no further tx or onc f/u needed.  Marland Kitchen History of latent tuberculosis    got approp tx x 9 mo  . History of palpitations 2016   sinus tachycardia.  30 day event monitor was normal (Dr. Irish Lack)  . History of pneumonia   . HTN (hypertension) 01/28/2012  . Hyperlipidemia 01/28/2012  . Hypothyroidism 02/14/2014  . Lipoma 01/05/2018  . Mediastinal adenopathy 2016   on CT angio done for atypical CP.  Has FH of NHL in sister. Pulm saw her 01/2016 and recommended a f/u CT 09/2016 but this doesn't appear to have been done.  Rpt CT 02/2019->resolved.  Marland Kitchen  Nasal polyp 08/30/2012  . Obesity   . OSA on CPAP   . Osteoarthritis    L knee-"end stage"--Dr. Delfino Lovett  . Osteopenia   . Peripheral vascular disease ([place]) 04   axillary,upper chest after 3 weeks on tamoxifen  . Sialoadenitis of submandibular gland 08/27/2018   LEFT. doxy rx'd by ED. ENT f/u the next day->reassured, dx'd with partial left submandibular gland obstruction w/out sign of infxn; f/u prn.  . Tobacco abuse-unspec 10/12/2013    Past Surgical History:  Procedure Laterality Date  . ABDOMINAL HYSTERECTOMY  1991  . APPENDECTOMY  91  . BILATERAL OOPHORECTOMY  01/2003  . BREAST SURGERY Left 2004   lumpectomy  . CARPAL TUNNEL RELEASE     right  . COLONOSCOPY  10/15/2005   Dr. Bronson Ing,  poor prep  . INCISION AND DRAINAGE ABSCESS Left 12/13/2015   Procedure: DRAINAGE LEFT MASTECTOMY WOUND INFECTION;  Surgeon: Fanny Skates, MD;  Location: [facility];  Service: General;  Laterality: Left;  Marland Kitchen MASTECTOMY W/ SENTINEL NODE BIOPSY Bilateral 10/26/2015   Procedure: RIGHT TOTAL MASTECTOMY WITH RIGHT SENTINEL LYMPH NODE BIOPSY, INJECT BLUE DYE RIGHT BREAST, LEFT BREAST PROPHYLACTIC MASTECTOMY;  Surgeon: Fanny Skates, MD;  Location: [facility];  Service: General;  Laterality: Bilateral;  . NASAL SEPTUM SURGERY  08  . PILONIDAL CYST EXCISION    . THYROIDECTOMY, PARTIAL  mid 80's   right side  . TOTAL KNEE ARTHROPLASTY Right   . TRANSTHORACIC ECHOCARDIOGRAM  05/25/2008   ? mild increase in LV filling pressure, EF 65%, normal LV function/wall motion, no LVH or ventricular enlargement.    Outpatient Medications Prior to Visit  Medication Sig Dispense Refill  . amitriptyline (ELAVIL) 25 MG tablet 1-2 qhs 180 tablet 3  . cholecalciferol (VITAMIN D) 1000 UNITS tablet Take 1,000 Units by mouth daily.    . DULoxetine (CYMBALTA) 30 MG capsule Take 3 capsules (90 mg total) by mouth daily. 270 capsule 3  . DULoxetine (CYMBALTA) 60 MG capsule Take 60 mg by mouth daily.    Marland Kitchen esomeprazole (NEXIUM) 20 MG capsule Take 1 capsule (20 mg total) by mouth daily at 12 noon. 90 capsule 3  . Fluticasone-Salmeterol (ADVAIR DISKUS) 100-50 MCG/DOSE AEPB TAKE 1 PUFF BY MOUTH TWICE A DAY 180 each 1  . hydrochlorothiazide (HYDRODIURIL) 25 MG tablet TAKE 1 TABLET BY MOUTH  DAILY 90 tablet 3  . ibuprofen (ADVIL,MOTRIN) 200 MG tablet Take 800 mg by mouth every 6 (six) hours as needed for headache or mild pain.     Marland Kitchen ipratropium (ATROVENT) 0.03 % nasal spray Place 2 sprays into the nose 4 (four) times daily. 30 mL 1  . levothyroxine (SYNTHROID) 50 MCG tablet TAKE 1 TABLET BY MOUTH  DAILY BEFORE BREAKFAST 90 tablet 3  . lisinopril (ZESTRIL) 10 MG tablet TAKE 1 TABLET BY MOUTH EVERY DAY 30 tablet 0  . metFORMIN (GLUCOPHAGE) 1000  MG tablet TAKE 1 TABLET BY MOUTH  TWICE DAILY WITH MEALS 180 tablet 3  . metoprolol tartrate (LOPRESSOR) 100 MG tablet TAKE 1 TABLET BY MOUTH  TWICE DAILY 180 tablet 3  . Multiple Vitamins-Minerals (MULTIVITAMIN WITH MINERALS) tablet Take 1 tablet by mouth daily.    . pravastatin (PRAVACHOL) 40 MG tablet TAKE 1 TABLET BY MOUTH  DAILY 90 tablet 1  . tiZANidine (ZANAFLEX) 4 MG tablet TAKE 1 TABLET BY MOUTH AT  BEDTIME 90 tablet 1  . albuterol (PROVENTIL HFA;VENTOLIN HFA) 108 (90 Base) MCG/ACT inhaler INHALE 2 PUFFS INTO THE LUNGS EVERY 4 HOURS AS  NEEDED FOR WHEEZING OR SHORTNESS OF BREATH OR COUGH (Patient not taking: Reported on 09/12/2018) 48 Inhaler 4  . albuterol (PROVENTIL) (2.5 MG/3ML) 0.083% nebulizer solution Take 3 mLs (2.5 mg total) by nebulization every 6 (six) hours as needed for wheezing or shortness of breath. (Patient not taking: Reported on 09/12/2018) 150 mL 1   No facility-administered medications prior to visit.    Allergies  Allergen Reactions  . Chantix [Varenicline] Other (See Comments)    "Felt like having mental breakdown"  . Ciprofloxacin Other (See Comments)    Muscle tightness, tendon aching and pain  . Latex Itching  . Penicillins Anaphylaxis and Swelling  . Tamoxifen Other (See Comments)    Blood clots  . Levaquin [Levofloxacin] Other (See Comments)    Severe tendon pain  . Lyrica [Pregabalin] Swelling  . Neurontin [Gabapentin] Swelling  . Morphine And Related Other (See Comments)    MORPHINE DRIP - causes pt to feel irritable and itch  . Ceclor [Cefaclor] Rash  . Other Other (See Comments)    Redness from tape and bandaides  . Sulfa Antibiotics Rash    ROS As per HPI  PE: Blood pressure 113/72, pulse 89, temperature 98 F (36.7 C), temperature source Temporal, resp. rate 16, height 5' 3.5" (1.613 m), weight 228 lb 12.8 oz (103.8 kg), SpO2 99 %. Gen: Alert, well appearing.  Patient is oriented to person, place, time, and situation. AFFECT: pleasant,  lucid thought and speech. [id number]: no injection, icteris, swelling, or exudate.  EOMI, PERRLA. Mouth: lips without lesion/swelling.  Oral mucosa pink and moist. Oropharynx without erythema, exudate, or swelling. Nares patent and non-inflamed. CV: RRR, no m/r/g.   LUNGS: CTA bilat, nonlabored resps, good aeration in all lung fields. EXT: no clubbing or cyanosis.  no edema.     LABS:   Lab Results  Component Value Date   CHOL 125 06/16/2019   HDL 42.60 06/16/2019   LDLCALC 55 06/16/2019   TRIG 137.0 06/16/2019   CHOLHDL 3 06/16/2019      Chemistry      Component Value Date/Time   NA 139 07/01/2019 0958   NA 141 04/09/2018 1545   NA 141 09/15/2015 1035   K 4.5 07/01/2019 0958   K 4.0 09/15/2015 1035   CL 99 07/01/2019 0958   CO2 30 07/01/2019 0958   CO2 30 (H) 09/15/2015 1035   BUN 13 07/01/2019 0958   BUN 12 04/09/2018 1545   BUN 15.5 09/15/2015 1035   CREATININE 0.75 07/01/2019 0958   CREATININE 0.91 01/25/2016 1011   CREATININE 0.9 09/15/2015 1035      Component Value Date/Time   CALCIUM 9.9 07/01/2019 0958   CALCIUM 9.8 09/15/2015 1035   ALKPHOS 62 06/16/2019 0904   ALKPHOS 60 09/15/2015 1035   AST 19 06/16/2019 0904   AST 35 (H) 09/15/2015 1035   ALT 33 06/16/2019 0904   ALT 53 09/15/2015 1035   BILITOT 0.4 06/16/2019 0904   BILITOT 0.3 04/09/2018 1545   BILITOT 0.31 09/15/2015 1035     Lab Results  Component Value Date   WBC 8.0 08/26/2018   HGB 14.3 08/26/2018   HCT 41.6 08/26/2018   MCV 85.8 08/26/2018   PLT 268 08/26/2018   Lab Results  Component Value Date   TSH 1.88 02/16/2019   Lab Results  Component Value Date   HGBA1C 6.5 06/16/2019    IMPRESSION AND PLAN:  1) HTN: well controlled. Contine lisinopril and hctz at current dosing. No labs needed  today. CMET at f/u in 3 mo.  2) Insomnia: doing great now on amitriptyline TWO of 25mg  tabs qhs.  3) DPN: this pain is signif improved with the increased dose of amitriptyline.  No  changes.  4) OSA: she is getting set up with pulm sleep md (to get re-established/possible rpt sleep study) as we speak.  5) Allergic rhinitis: I gave suggestions of otc nonsedating antihistamines she can try. If not helping then I'll start her on daily inhaled nasal steroid.  An After Visit Summary was printed and given to the patient.  FOLLOW UP: Return in about 3 months (around 11/04/2019) for f/u [facility]. DM, HTN, also needs FLP if she comes fasting.  Signed:  Crissie Sickles, MD           08/05/2019

## 2019-08-11 ENCOUNTER — Other Ambulatory Visit: Payer: Self-pay | Admitting: Family Medicine

## 2019-08-12 ENCOUNTER — Other Ambulatory Visit: Payer: Self-pay | Admitting: Family Medicine

## 2019-08-12 DIAGNOSIS — M797 Fibromyalgia: Secondary | ICD-10-CM

## 2019-08-12 NOTE — Telephone Encounter (Signed)
RF request for cymbalta 30mg  and cymbalta 60 mg  Looks like last RX sent was for cymbalta 30mg  3 tabs daily # 270 x 3 rfs.   Cymbalta 60 mg is an old rx?    Last OV 08/05/19 Next OV 11/05/19.  Refill appropriate?

## 2019-08-20 DIAGNOSIS — G43B0 Ophthalmoplegic migraine, not intractable: Secondary | ICD-10-CM | POA: Diagnosis not present

## 2019-08-20 DIAGNOSIS — H5315 Visual distortions of shape and size: Secondary | ICD-10-CM | POA: Diagnosis not present

## 2019-08-26 ENCOUNTER — Telehealth: Payer: Self-pay

## 2019-08-26 NOTE — Telephone Encounter (Signed)
Pt scheduled for 4/23, FYI  Patient Name: Susan [name] Gender: Female DOB: [birth year] Age: 69 Y 53 M 54 D Return Phone Number: [phone] (Primary), [phone] (Secondary) Address: City/State/Zip: [place] [address] Client [facility] - Client Client Site [facility] - Day Physician Crissie Sickles - MD Contact Type Call Who Is Calling Patient / Member / Family / Caregiver Call Type Triage / Clinical Relationship To Patient Self Return Phone Number [phone] (Primary) Chief Complaint CONFUSION - new onset Reason for Call Symptomatic / Request for [place] has had forgetfulness which is abnormal for her. She has also fallen twice a week ago and yesterday. She has a pain in her right shoulder when she reaches for something. Translation No Nurse Assessment Nurse: Laurena Bering, RN, Helene Kelp Date/Time Eilene Ghazi Time): 08/26/2019 10:03:52 AM Confirm and document reason for call. If symptomatic, describe symptoms. ---Caller states that she is having periods of being forgetful. Has fallen twice. Has sharp shooting pain in right shoulder. No pain in shoulder after first fall. Has the patient had close contact with a person known or suspected to have the novel coronavirus illness OR traveled / lives in area with major community spread (including international travel) in the last 14 days from the onset of symptoms? * If Asymptomatic, screen for exposure and travel within the last 14 days. ---No Does the patient have any new or worsening symptoms? ---Yes Will a triage be completed? ---Yes Related visit to physician within the last 2 weeks? ---No Does the PT have any chronic conditions? (i.e. diabetes, asthma, this includes High risk factors for pregnancy, etc.) ---Yes List chronic conditions. ---fibromyalgia, depression, HTN , DM , neuropathy, GERD Is this a behavioral health or substance abuse call?  ---No Guidelines Guideline Title Affirmed Question Affirmed Notes Nurse Date/Time (Eastern Time) Dementia Symptoms and Questions [1] Worsening confusion AND [2] gradual onset (days to weeks) Laurena Bering, Therapist, sports, Helene Kelp 08/26/2019 10:09:00 AMPPage: 2 of 2 Call Id: [id number] Disp. Time Eilene Ghazi Time) Disposition Final User 08/26/2019 10:00:05 AM Send to Urgent Vassie Loll 08/26/2019 10:09:46 AM See PCP within 24 Hours Yes Laurena Bering, RN, [place] Artist Disagree/Comply Comply Caller Understands Yes PreDisposition Call Doctor Care Advice Given Per Guideline SEE PCP WITHIN 24 HOURS: ANOTHER ADULT SHOULD DRIVE: * It is better and safer if another adult drives instead of you. BRING MEDICINES: * Please bring a list of your current medicines when you go to see the doctor. CALL BACK IF: * Symptoms become worse. CARE ADVICE given per Dementia Symptoms and Questions (Adult) guideline. Referrals REFERRED TO PCP OFFICE

## 2019-08-26 NOTE — Telephone Encounter (Signed)
Noted  

## 2019-08-27 ENCOUNTER — Other Ambulatory Visit: Payer: Self-pay

## 2019-08-27 ENCOUNTER — Ambulatory Visit: Payer: Medicare Other | Admitting: Pulmonary Disease

## 2019-08-27 ENCOUNTER — Encounter: Payer: Self-pay | Admitting: Pulmonary Disease

## 2019-08-27 VITALS — BP 140/70 | HR 68 | Temp 97.7°F | Ht 63.75 in | Wt 235.6 lb

## 2019-08-27 DIAGNOSIS — G4733 Obstructive sleep apnea (adult) (pediatric): Secondary | ICD-10-CM | POA: Diagnosis not present

## 2019-08-27 NOTE — Patient Instructions (Signed)
History of obstructive sleep apnea  We will order a home sleep study Update you with results  Upgrade your machine once we have results available  We will follow-up with you in about 3 months  Call with significant concerns  Continue smoking cessation efforts

## 2019-08-27 NOTE — Progress Notes (Signed)
Susan [name]    [id number]    Dec 18, 1950  Primary Care Physician:McGowen, Adrian Blackwater, MD  Referring Physician: Tammi Sou, MD [address] [place],  [place] [address]  Chief complaint:   Patient with a history of obstructive sleep apnea  HPI:  Obstructive sleep apnea was diagnosed many years ago She currently is on CPAP Sponsors told that she snores occasionally Denies any dryness of her mouth in the mornings Usually goes to bed between 8 and 9 PM Takes few minutes to fall asleep about 5 to 10 minutes Wakes up maybe once or twice Final awakening time about 5 AM Sleep she feels is restorative enough Has gained about 15 pounds recently   No family history of obstructive sleep apnea  Medical problems include hypertension, diabetes, hypercholesterolemia  She is an active smoker  Outpatient Encounter Medications as of 08/27/2019  Medication Sig  . albuterol (PROVENTIL HFA;VENTOLIN HFA) 108 (90 Base) MCG/ACT inhaler INHALE 2 PUFFS INTO THE LUNGS EVERY 4 HOURS AS NEEDED FOR WHEEZING OR SHORTNESS OF BREATH OR COUGH  . albuterol (PROVENTIL) (2.5 MG/3ML) 0.083% nebulizer solution Take 3 mLs (2.5 mg total) by nebulization every 6 (six) hours as needed for wheezing or shortness of breath.  Marland Kitchen amitriptyline (ELAVIL) 25 MG tablet 1-2 qhs  . cholecalciferol (VITAMIN D) 1000 UNITS tablet Take 1,000 Units by mouth daily.  . DULoxetine (CYMBALTA) 30 MG capsule TAKE 1 CAPSULES BY MOUTH EVERY DAY W/60 MG CAPSULE  . DULoxetine (CYMBALTA) 60 MG capsule 1 cap po qd taken with a duloxetine 30 mg cap  . esomeprazole (NEXIUM) 20 MG capsule Take 1 capsule (20 mg total) by mouth daily at 12 noon.  . Fluticasone-Salmeterol (ADVAIR DISKUS) 100-50 MCG/DOSE AEPB TAKE 1 PUFF BY MOUTH TWICE A DAY  . hydrochlorothiazide (HYDRODIURIL) 25 MG tablet TAKE 1 TABLET BY MOUTH  DAILY  . ibuprofen (ADVIL,MOTRIN) 200 MG tablet Take 800 mg by mouth every 6 (six) hours as needed for headache or  mild pain.   Marland Kitchen ipratropium (ATROVENT) 0.03 % nasal spray Place 2 sprays into the nose 4 (four) times daily.  Marland Kitchen levothyroxine (SYNTHROID) 50 MCG tablet TAKE 1 TABLET BY MOUTH  DAILY BEFORE BREAKFAST  . lisinopril (ZESTRIL) 10 MG tablet Take 1 tablet (10 mg total) by mouth daily.  . metFORMIN (GLUCOPHAGE) 1000 MG tablet TAKE 1 TABLET BY MOUTH  TWICE DAILY WITH MEALS  . metoprolol tartrate (LOPRESSOR) 100 MG tablet TAKE 1 TABLET BY MOUTH  TWICE DAILY  . Multiple Vitamins-Minerals (MULTIVITAMIN WITH MINERALS) tablet Take 1 tablet by mouth daily.  . pravastatin (PRAVACHOL) 40 MG tablet TAKE 1 TABLET BY MOUTH  DAILY  . tiZANidine (ZANAFLEX) 4 MG tablet TAKE 1 TABLET BY MOUTH AT  BEDTIME   No facility-administered encounter medications on file as of 08/27/2019.    Allergies as of 08/27/2019 - Review Complete 08/27/2019  Allergen Reaction Noted  . Chantix [varenicline] Other (See Comments) 12/26/2011  . Ciprofloxacin Other (See Comments) 12/21/2011  . Latex Itching 06/16/2019  . Penicillins Anaphylaxis and Swelling 12/21/2011  . Tamoxifen Other (See Comments) 03/09/2013  . Levaquin [levofloxacin] Other (See Comments) 12/09/2015  . Lyrica [pregabalin] Swelling 12/21/2011  . Neurontin [gabapentin] Swelling 12/21/2011  . Morphine and related Other (See Comments) 10/17/2015  . Ceclor [cefaclor] Rash 12/26/2011  . Other Other (See Comments) 10/18/2015  . Sulfa antibiotics Rash 12/21/2011    Past Medical History:  Diagnosis Date  . Anxiety and  depression   . Atypical chest pain 2016   ruled out for MI, stress test showed no ischemia.  CT angio NO PE.  . Cellulitis of chest wall 12/09/2015   s/p breast surgery  . Colon cancer screening 05/11/2018   2007 colonoscopy normal.  Cologuard neg 05/11/18; repeat 3 yrs.  Marland Kitchen COPD (chronic obstructive pulmonary disease) (HCC)    mild; prn albut per Dr. Lamonte Sakai as of 2017--no f/u w/pulm since then.  . DDD (degenerative disc disease)   . Diabetes mellitus  ([facility])    w/DPN  . Diabetic peripheral neuropathy ([facility])   . DJD (degenerative joint disease)   . Fatty liver disease, nonalcoholic   . Fibromyalgia    cymbalta  . GERD (gastroesophageal reflux disease)   . History of blood clots 2004   during cancer treatment  . History of breast cancer 2017   DCIS->bilat mastectomy (Sister had breast cancer in her 27s). Last seen by onc not long after surgery, no further tx or onc f/u needed.  Marland Kitchen History of latent tuberculosis    got approp tx x 9 mo  . History of palpitations 2016   sinus tachycardia.  30 day event monitor was normal (Dr. Irish Lack)  . History of pneumonia   . HTN (hypertension) 01/28/2012  . Hyperlipidemia 01/28/2012  . Hypothyroidism 02/14/2014  . Lipoma 01/05/2018  . Mediastinal adenopathy 2016   on CT angio done for atypical CP.  Has FH of NHL in sister. Pulm saw her 01/2016 and recommended a f/u CT 09/2016 but this doesn't appear to have been done.  Rpt CT 02/2019->resolved.  . Nasal polyp 08/30/2012  . Obesity   . OSA on CPAP   . Osteoarthritis    L knee-"end stage"--Dr. Delfino Lovett  . Osteopenia   . Peripheral vascular disease ([facility]) 04   axillary,upper chest after 3 weeks on tamoxifen  . Sialoadenitis of submandibular gland 08/27/2018   LEFT. doxy rx'd by ED. ENT f/u the next day->reassured, dx'd with partial left submandibular gland obstruction w/out sign of infxn; f/u prn.  . Tobacco abuse-unspec 10/12/2013    Past Surgical History:  Procedure Laterality Date  . ABDOMINAL HYSTERECTOMY  1991  . APPENDECTOMY  91  . BILATERAL OOPHORECTOMY  01/2003  . BREAST SURGERY Left 2004   lumpectomy  . CARPAL TUNNEL RELEASE     right  . COLONOSCOPY  10/15/2005   Dr. Bronson Ing, poor prep  . INCISION AND DRAINAGE ABSCESS Left 12/13/2015   Procedure: DRAINAGE LEFT MASTECTOMY WOUND INFECTION;  Surgeon: Fanny Skates, MD;  Location: [facility];  Service: General;  Laterality: Left;  Marland Kitchen MASTECTOMY W/ SENTINEL NODE BIOPSY Bilateral 10/26/2015    Procedure: RIGHT TOTAL MASTECTOMY WITH RIGHT SENTINEL LYMPH NODE BIOPSY, INJECT BLUE DYE RIGHT BREAST, LEFT BREAST PROPHYLACTIC MASTECTOMY;  Surgeon: Fanny Skates, MD;  Location: [facility];  Service: General;  Laterality: Bilateral;  . NASAL SEPTUM SURGERY  08  . PILONIDAL CYST EXCISION    . THYROIDECTOMY, PARTIAL  mid 80's   right side  . TOTAL KNEE ARTHROPLASTY Right   . TRANSTHORACIC ECHOCARDIOGRAM  05/25/2008   ? mild increase in LV filling pressure, EF 65%, normal LV function/wall motion, no LVH or ventricular enlargement.    Family History  Problem Relation Age of Onset  . Cancer Mother 60       liver cancer, hep c  . Heart disease Mother        chf  . Hypertension Mother   . Hyperlipidemia Mother   .  Osteoporosis Mother   . Cancer Father 61       lung; smoker  . COPD Father   . Heart disease Sister        mvp  . Breast cancer Sister        dx. 46s  . Non-Hodgkin's lymphoma Sister 73       large B cell  . Hypertension Brother   . Arthritis Brother   . Kidney disease Brother   . Prostate cancer Brother        dx. early 41s  . Other Daughter        Beal's Syndrome  . Arthritis Daughter        Beal's  . Arthritis Son        Beal's connective tissue disease  . Vision loss Maternal Grandmother   . Dementia Maternal Grandmother   . Coronary artery disease Maternal Grandmother   . Heart Problems Maternal Grandmother   . Vision loss Paternal Grandfather   . Hypertension Brother   . Benign prostatic hyperplasia Brother   . Leukemia Brother        chronic lymphatic leukemia - small/B cell  . Hypertension Brother   . COPD Brother   . Prostate cancer Brother        dx. mid-60s  . Myelodysplastic syndrome Brother        dx. early 41s  . Hypertension Brother   . Hyperlipidemia Brother   . Prostate cancer Brother        dx. mid-70s  . Melanoma Brother        (x2) melanomas dx. late 60s-early 58s  . Mental illness Brother        schizophrenia  . Breast cancer Brother         dx. 72s  . Cirrhosis Brother        hep c  . Hypertension Sister   . Other Sister        thalessemia, anemia; hx of hysterectomy in her 65s  . Hyperlipidemia Sister   . Gout Sister   . Diverticulitis Sister   . Other Daughter        overweight  . Breast cancer Maternal Aunt 80  . Lung cancer Cousin        maternal 1st cousin dx. 55 or younger; former smoker  . Colon cancer Cousin        maternal 1st cousin dx. late 6s-early 60s  . Cancer Cousin        maternal 1st cousin d. NOS cancer  . Emphysema Paternal Aunt   . Lung cancer Paternal Aunt        d. early 79s; smoker  . Leukemia Cousin        paternal 1st cousin d. early 47s  . Colon cancer Other 98       niece  . Melanoma Other        niece    Social History   Socioeconomic History  . Marital status: Married    Spouse name: Not on file  . Number of children: Not on file  . Years of education: Not on file  . Highest education level: Not on file  Occupational History  . Not on file  Tobacco Use  . Smoking status: Current Every Day Smoker    Packs/day: 1.00    Years: 35.00    Pack years: 35.00    Types: Cigarettes  . Smokeless tobacco: Never Used  Substance and Sexual Activity  . Alcohol use:  No    Alcohol/week: 0.0 standard drinks  . Drug use: No  . Sexual activity: Never  Other Topics Concern  . Not on file  Social History Narrative   Married, 3 children.  Two have Beal's syndrome (connective tissue d/o similar to Marfan's).   Orig from [place].  [place] since 33s.   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   Tobacco (current as of 08/2018).   No alcohol.   Social Determinants of Health   Financial Resource Strain:   . Difficulty of Paying Living Expenses:   Food Insecurity:   . Worried About Charity fundraiser in the Last Year:   . Arboriculturist in the Last Year:   Transportation Needs:   . Film/video editor (Medical):     Marland Kitchen Lack of Transportation (Non-Medical):   Physical Activity:   . Days of Exercise per Week:   . Minutes of Exercise per Session:   Stress:   . Feeling of Stress :   Social Connections:   . Frequency of Communication with Friends and Family:   . Frequency of Social Gatherings with Friends and Family:   . Attends Religious Services:   . Active Member of Clubs or Organizations:   . Attends Archivist Meetings:   Marland Kitchen Marital Status:   Intimate Partner Violence:   . Fear of Current or Ex-Partner:   . Emotionally Abused:   Marland Kitchen Physically Abused:   . Sexually Abused:     Review of Systems  Constitutional: Negative.   Respiratory: Positive for apnea.   Psychiatric/Behavioral: Positive for sleep disturbance.    Vitals:   08/27/19 1031  BP: 140/70  Pulse: 68  Temp: 97.7 F (36.5 C)  SpO2: 99%     Physical Exam  Constitutional: She is oriented to person, place, and time. She appears well-developed.  HENT:  Head: Normocephalic.  Eyes: Pupils are equal, round, and reactive to light. Right eye exhibits no discharge. Left eye exhibits no discharge.  Neck: No tracheal deviation present. No thyromegaly present.  Cardiovascular: Normal rate and regular rhythm.  Pulmonary/Chest: Effort normal and breath sounds normal. No respiratory distress. She has no wheezes. She has no rales.  Musculoskeletal:        General: Normal range of motion.     Cervical back: Normal range of motion and neck supple.  Neurological: She is alert and oriented to person, place, and time.  Skin: Skin is warm.  Psychiatric: She has a normal mood and affect.    Results of the [facility] flowsheet 08/27/2019  Sitting and reading 1  Watching TV 1  Sitting, inactive in a public place (e.g. a theatre or a meeting) 0  As a passenger in a car for an hour without a break 2  Lying down to rest in the afternoon when circumstances permit 1  Sitting and talking to someone 0  Sitting quietly after a lunch without  alcohol 1  In a car, while stopped for a few minutes in traffic 0  Total score 6   Assessment:  History of obstructive sleep apnea -Compliant with CPAP use -She still does have some snoring  Machine is dated  Obesity  Pathophysiology of obstructive sleep apnea discussed with the patient Treatment options discussed with the patient  Plan/Recommendations: We will schedule patient for home sleep study  Encouraged to continue using current CPAP for now  We will follow-up in 3  months  Encouraged to continue working on weight loss efforts  Smoking cessation counseling   Sherrilyn Rist MD [facility] 08/27/2019, 10:55 AM  CC: McGowen, Adrian Blackwater, MD

## 2019-08-28 ENCOUNTER — Encounter: Payer: Self-pay | Admitting: Family Medicine

## 2019-08-28 ENCOUNTER — Other Ambulatory Visit: Payer: Self-pay

## 2019-08-28 ENCOUNTER — Ambulatory Visit (INDEPENDENT_AMBULATORY_CARE_PROVIDER_SITE_OTHER): Payer: Medicare Other | Admitting: Family Medicine

## 2019-08-28 VITALS — BP 113/73 | HR 65 | Temp 97.9°F | Resp 16 | Ht 63.75 in | Wt 232.4 lb

## 2019-08-28 DIAGNOSIS — F488 Other specified nonpsychotic mental disorders: Secondary | ICD-10-CM

## 2019-08-28 DIAGNOSIS — R296 Repeated falls: Secondary | ICD-10-CM

## 2019-08-28 DIAGNOSIS — T50905A Adverse effect of unspecified drugs, medicaments and biological substances, initial encounter: Secondary | ICD-10-CM

## 2019-08-28 DIAGNOSIS — M25362 Other instability, left knee: Secondary | ICD-10-CM | POA: Diagnosis not present

## 2019-08-28 DIAGNOSIS — R4189 Other symptoms and signs involving cognitive functions and awareness: Secondary | ICD-10-CM

## 2019-08-28 DIAGNOSIS — M797 Fibromyalgia: Secondary | ICD-10-CM | POA: Diagnosis not present

## 2019-08-28 NOTE — Progress Notes (Signed)
CC:  Patient is a 69 y.o. Caucasian female with a PMH of OSA, T2DM with PDN, hypothyroidism, COPD, 35 pack year smoking history, osteoarthritis, HTN, HLD with PVD, obesity, and anxiety/depression who presents for brain fog, forgetfulness, and recent fall.   HPI:  Back in 2010 she had a neg MRI and MR angiogram of brain that was done for tinnitis, hx of breast ca.  [place] Tuesday of this week and Thursday of last week  Left knee is unstable - [facility] 2019   Having mental fogginess  Took amitryptilline 25mg x2 for neuropathy  -feels trouble concentrating and wonders if this increased dose is the cause   Forgot appointment for meals on wheels but didn't put in calendar but thinks it is within normal bounds  Nobody else in family has noticed her being forgetful  Oriented to person, place, and time  Has not had any bronchitis, pneumonia, or COPD exacerbations  Has had no dizziness, palpitations, headaches Got both of her COVID vaccines   Had appt w sleep study doc yesterday, trying to quit smoking  -nicotine patches helped ; had stopped for about a year  About half a pack a day now  Switching to decaf coffee feels more calm  Been depressed somewhat recently  -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 Anxiety and depression   . Atypical chest pain 2016   ruled out for MI, stress test showed no ischemia.  CT angio NO PE.  . Cellulitis of chest wall  12/09/2015   s/p breast surgery  . Colon cancer screening 05/11/2018   2007 colonoscopy normal.  Cologuard neg 05/11/18; repeat 3 yrs.  Marland Kitchen COPD (chronic obstructive pulmonary disease) (HCC)    mild; prn albut per Dr. Lamonte Sakai as of 2017--no f/u w/pulm since then.  . DDD (degenerative disc disease)   . Diabetes mellitus ([place])    w/DPN  . Diabetic peripheral neuropathy ([place])   . DJD (degenerative joint disease)   . Fatty liver disease, nonalcoholic   . Fibromyalgia    cymbalta  . GERD (gastroesophageal reflux disease)   . History of blood clots 2004   during cancer treatment  . History of breast cancer 2004   DCIS->lumpectomy + rad. Eventually go bilat mastectomy in 2017 (Sister had breast cancer in her 30s). Last seen by onc not long after surgery, no further tx or onc f/u needed.  Marland Kitchen History of latent tuberculosis    got approp tx x 9 mo  . History of palpitations 2016   sinus tachycardia.  30 day event monitor was normal (Dr. Irish Lack)  . History of pneumonia   . HTN (hypertension) 01/28/2012  . Hyperlipidemia 01/28/2012  . Hypothyroidism 02/14/2014  . Lipoma 01/05/2018  . Mediastinal adenopathy 2016   on CT angio done for atypical CP.  Has FH of NHL in sister. Pulm saw her 01/2016 and recommended a f/u CT 09/2016 but this doesn't appear  to have been done.  Rpt CT 02/2019->resolved.  . Nasal polyp 08/30/2012  . Obesity   . OSA on CPAP   . Osteoarthritis    L knee-"end stage"--Dr. Delfino Lovett  . Osteopenia   . Peripheral vascular disease ([place]) 04   axillary,upper chest after 3 weeks on tamoxifen  . Sialoadenitis of submandibular gland 08/27/2018   LEFT. doxy rx'd by ED. ENT f/u the next day->reassured, dx'd with partial left submandibular gland obstruction w/out sign of infxn; f/u prn.  . Tobacco abuse-unspec 10/12/2013    M/A: Current Outpatient Medications on File Prior to Visit  Medication Sig Dispense Refill  . albuterol (PROVENTIL HFA;VENTOLIN HFA) 108 (90 Base) MCG/ACT inhaler  INHALE 2 PUFFS INTO THE LUNGS EVERY 4 HOURS AS NEEDED FOR WHEEZING OR SHORTNESS OF BREATH OR COUGH 48 Inhaler 4  . albuterol (PROVENTIL) (2.5 MG/3ML) 0.083% nebulizer solution Take 3 mLs (2.5 mg total) by nebulization every 6 (six) hours as needed for wheezing or shortness of breath. 150 mL 1  . amitriptyline (ELAVIL) 25 MG tablet 1-2 qhs 180 tablet 3  . cholecalciferol (VITAMIN D) 1000 UNITS tablet Take 1,000 Units by mouth daily.    . DULoxetine (CYMBALTA) 30 MG capsule TAKE 1 CAPSULES BY MOUTH EVERY DAY W/60 MG CAPSULE 90 capsule 3  . DULoxetine (CYMBALTA) 60 MG capsule 1 cap po qd taken with a duloxetine 30 mg cap 90 capsule 3  . esomeprazole (NEXIUM) 20 MG capsule Take 1 capsule (20 mg total) by mouth daily at 12 noon. 90 capsule 3  . Fluticasone-Salmeterol (ADVAIR DISKUS) 100-50 MCG/DOSE AEPB TAKE 1 PUFF BY MOUTH TWICE A DAY 180 each 1  . hydrochlorothiazide (HYDRODIURIL) 25 MG tablet TAKE 1 TABLET BY MOUTH  DAILY 90 tablet 3  . ibuprofen (ADVIL,MOTRIN) 200 MG tablet Take 800 mg by mouth every 6 (six) hours as needed for headache or mild pain.     Marland Kitchen ipratropium (ATROVENT) 0.03 % nasal spray Place 2 sprays into the nose 4 (four) times daily. 30 mL 1  . levothyroxine (SYNTHROID) 50 MCG tablet TAKE 1 TABLET BY MOUTH  DAILY BEFORE BREAKFAST 90 tablet 3  . lisinopril (ZESTRIL) 10 MG tablet Take 1 tablet (10 mg total) by mouth daily. 90 tablet 0  . metFORMIN (GLUCOPHAGE) 1000 MG tablet TAKE 1 TABLET BY MOUTH  TWICE DAILY WITH MEALS 180 tablet 3  . metoprolol tartrate (LOPRESSOR) 100 MG tablet TAKE 1 TABLET BY MOUTH  TWICE DAILY 180 tablet 3  . Multiple Vitamins-Minerals (MULTIVITAMIN WITH MINERALS) tablet Take 1 tablet by mouth daily.    . pravastatin (PRAVACHOL) 40 MG tablet TAKE 1 TABLET BY MOUTH  DAILY 90 tablet 0  . tiZANidine (ZANAFLEX) 4 MG tablet TAKE 1 TABLET BY MOUTH AT  BEDTIME 90 tablet 1   No current facility-administered medications on file prior to visit.   Allergies  Allergen  Reactions  . Chantix [Varenicline] Other (See Comments)    "Felt like having mental breakdown"  . Ciprofloxacin Other (See Comments)    Muscle tightness, tendon aching and pain  . Latex Itching  . Penicillins Anaphylaxis and Swelling  . Tamoxifen Other (See Comments)    Blood clots  . Levaquin [Levofloxacin] Other (See Comments)    Severe tendon pain  . Lyrica [Pregabalin] Swelling  . Neurontin [Gabapentin] Swelling  . Morphine And Related Other (See Comments)    MORPHINE DRIP - causes pt to feel irritable and itch  . Ceclor [Cefaclor] Rash  . Other Other (See Comments)  Redness from tape and bandaides  . Sulfa Antibiotics Rash    FH: Family History  Problem Relation Age of Onset  . Cancer Mother 16       liver cancer, hep c  . Heart disease Mother        chf  . Hypertension Mother   . Hyperlipidemia Mother   . Osteoporosis Mother   . Cancer Father 41       lung; smoker  . COPD Father   . Heart disease Sister        mvp  . Breast cancer Sister        dx. 86s  . Non-Hodgkin's lymphoma Sister 15       large B cell  . Hypertension Brother   . Arthritis Brother   . Kidney disease Brother   . Prostate cancer Brother        dx. early 45s  . Other Daughter        Beal's Syndrome  . Arthritis Daughter        Beal's  . Arthritis Son        Beal's connective tissue disease  . Vision loss Maternal Grandmother   . Dementia Maternal Grandmother   . Coronary artery disease Maternal Grandmother   . Heart Problems Maternal Grandmother   . Vision loss Paternal Grandfather   . Hypertension Brother   . Benign prostatic hyperplasia Brother   . Leukemia Brother        chronic lymphatic leukemia - small/B cell  . Hypertension Brother   . COPD Brother   . Prostate cancer Brother        dx. mid-60s  . Myelodysplastic syndrome Brother        dx. early 49s  . Hypertension Brother   . Hyperlipidemia Brother   . Prostate cancer Brother        dx. mid-70s  . Melanoma  Brother        (x2) melanomas dx. late 60s-early 31s  . Mental illness Brother        schizophrenia  . Breast cancer Brother        dx. 37s  . Cirrhosis Brother        hep c  . Hypertension Sister   . Other Sister        thalessemia, anemia; hx of hysterectomy in her 4s  . Hyperlipidemia Sister   . Gout Sister   . Diverticulitis Sister   . Other Daughter        overweight  . Breast cancer Maternal Aunt 62  . Lung cancer Cousin        maternal 1st cousin dx. 45 or younger; former smoker  . Colon cancer Cousin        maternal 1st cousin dx. late 19s-early 60s  . Cancer Cousin        maternal 1st cousin d. NOS cancer  . Emphysema Paternal Aunt   . Lung cancer Paternal Aunt        d. early 64s; smoker  . Leukemia Cousin        paternal 1st cousin d. early 51s  . Colon cancer Other 23       niece  . Melanoma Other        niece    SH: Social History   Socioeconomic History  . Marital status: Married    Spouse name: Not on file  . Number of children: Not on file  . Years of education: Not on file  .  Highest education level: Not on file  Occupational History  . Not on file  Tobacco Use  . Smoking status: Current Every Day Smoker    Packs/day: 1.00    Years: 35.00    Pack years: 35.00    Types: Cigarettes  . Smokeless tobacco: Never Used  Substance and Sexual Activity  . Alcohol use: No    Alcohol/week: 0.0 standard drinks  . Drug use: No  . Sexual activity: Never  Other Topics Concern  . Not on file  Social History Narrative   Married, 3 children.  Two have Beal's syndrome (connective tissue d/o similar to Marfan's).   Orig from [place].  [place] since 52s.   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   Tobacco (current as of 08/2018).   No alcohol.   Social Determinants of Health   Financial Resource Strain:   . Difficulty of Paying Living Expenses:   Food Insecurity:   . Worried About Paediatric nurse in the Last Year:   . Arboriculturist in the Last Year:   Transportation Needs:   . Film/video editor (Medical):   Marland Kitchen Lack of Transportation (Non-Medical):   Physical Activity:   . Days of Exercise per Week:   . Minutes of Exercise per Session:   Stress:   . Feeling of Stress :   Social Connections:   . Frequency of Communication with Friends and Family:   . Frequency of Social Gatherings with Friends and Family:   . Attends Religious Services:   . Active Member of Clubs or Organizations:   . Attends Archivist Meetings:   Marland Kitchen Marital Status:     ROS: Review of Systems  Constitutional: Negative for chills, diaphoresis, fever, malaise/fatigue and weight loss.  HENT: Negative for congestion, hearing loss, sinus pain, sore throat and tinnitus.   Eyes: Negative for blurred vision, double vision, photophobia and pain.  Respiratory: Negative for cough, hemoptysis, sputum production, shortness of breath and wheezing.   Cardiovascular: Negative for chest pain, palpitations, orthopnea, claudication, leg swelling and PND.  Gastrointestinal: Negative for abdominal pain, blood in stool, constipation, diarrhea, heartburn, nausea and vomiting.  Genitourinary: Negative for dysuria.  Musculoskeletal: Positive for falls and neck pain.  Skin: Negative for itching and rash.  Neurological: Negative for dizziness, tingling, tremors, sensory change, focal weakness and headaches.  Endo/Heme/Allergies: Negative.   Psychiatric/Behavioral: Positive for depression. Negative for suicidal ideas. The patient is nervous/anxious. The patient does not have insomnia.     PE: Vitals with BMI 08/27/2019 08/05/2019 07/01/2019  Height 5' 3.75" 5' 3.5" 5' 3.5"  Weight 235 lbs 10 oz 228 lbs 13 oz 226 lbs 3 oz  BMI 40.77 [id number]  Systolic [id number]  Diastolic 70 72 77  Pulse 68 89 66    Physical Exam  Constitutional: She is oriented to person, place, and time and well-developed,  well-nourished, and in no distress. No distress.  HENT:  Head: Normocephalic and atraumatic.  Right Ear: External ear normal.  Left Ear: External ear normal.  Nose: Nose normal.  Mouth/Throat: Oropharynx is clear and moist. No oropharyngeal exudate.  Eyes: Pupils are equal, round, and reactive to light. Conjunctivae and EOM are normal. Right eye exhibits no discharge. Left eye exhibits no discharge. No scleral icterus.  Neck: No JVD present. No tracheal deviation present. No thyromegaly present.  Cardiovascular: Normal rate, regular rhythm, normal heart sounds and intact  distal pulses. Exam reveals no gallop and no friction rub.  No murmur heard. Pulmonary/Chest: Effort normal and breath sounds normal. No stridor. No respiratory distress. She has no wheezes. She has no rales. She exhibits no tenderness.  Musculoskeletal:        General: No tenderness, deformity or edema. Normal range of motion.     Cervical back: Normal range of motion and neck supple.  Lymphadenopathy:    She has no cervical adenopathy.  Neurological: She is alert and oriented to person, place, and time. She has normal sensation, normal strength, normal reflexes and intact cranial nerves. She is not agitated and not disoriented. She displays no weakness, no atrophy, no tremor, facial symmetry, normal stance, normal speech and normal reflexes. No cranial nerve deficit or sensory deficit. She exhibits normal muscle tone. She has an abnormal Tandem Gait Test. She has a normal Cerebellar Exam, a normal Heel to [facility] and a normal Romberg Test. She shows no pronator drift. Coordination and gait normal. GCS score is 15.  Diabetic foot exam: 8/8 sensation b/l  Skin: Skin is warm and dry. No rash noted. She is not diaphoretic. No erythema. No pallor.  Psychiatric: Mood, memory, affect and judgment normal.    Labs: Recent Results (from the past 2160 hour(s))  Hemoglobin A1c     Status: None   Collection Time: 06/16/19  9:04 AM   Result Value Ref Range   Hgb A1c MFr Bld 6.5 4.6 - 6.5 %    Comment: Glycemic Control Guidelines for People with Diabetes:Non Diabetic:  <6%Goal of Therapy: <7%Additional Action Suggested:  >8%   Lipid panel     Status: None   Collection Time: 06/16/19  9:04 AM  Result Value Ref Range   Cholesterol 125 0 - 200 mg/dL    Comment: ATP III Classification       Desirable:  < 200 mg/dL               Borderline High:  200 - 239 mg/dL          High:  > = 240 mg/dL   Triglycerides 137.0 0.0 - 149.0 mg/dL    Comment: Normal:  <150 mg/dLBorderline High:  150 - 199 mg/dL   HDL 42.60 >39.00 mg/dL   VLDL 27.4 0.0 - 40.0 mg/dL   LDL Cholesterol 55 0 - 99 mg/dL   Total CHOL/HDL Ratio 3     Comment:                Men          Women1/2 Average Risk     3.4          3.3Average Risk          5.0          4.42X Average Risk          9.6          7.13X Average Risk          15.0          11.0                       NonHDL 82.37     Comment: NOTE:  Non-HDL goal should be 30 mg/dL higher than patient's LDL goal (i.e. LDL goal of < 70 mg/dL, would have non-HDL goal of < 100 mg/dL)  Comprehensive metabolic panel     Status: Abnormal   Collection Time: 06/16/19  9:04 AM  Result Value Ref Range   Sodium 138 135 - 145 mEq/L   Potassium 3.9 3.5 - 5.1 mEq/L   Chloride 99 96 - 112 mEq/L   CO2 31 19 - 32 mEq/L   Glucose, Bld 102 (H) 70 - 99 mg/dL   BUN 16 6 - 23 mg/dL   Creatinine, Ser 0.84 0.40 - 1.20 mg/dL   Total Bilirubin 0.4 0.2 - 1.2 mg/dL   Alkaline Phosphatase 62 39 - 117 U/L   AST 19 0 - 37 U/L   ALT 33 0 - 35 U/L   Total Protein 6.4 6.0 - 8.3 g/dL   Albumin 4.2 3.5 - 5.2 g/dL   GFR 67.39 >60.00 mL/min   Calcium 9.3 8.4 - 10.5 mg/dL  Microalbumin / creatinine urine ratio     Status: None   Collection Time: 06/16/19  9:04 AM  Result Value Ref Range   Microalb, Ur <0.7 0.0 - 1.9 mg/dL   Creatinine,U 27.1 mg/dL   Microalb Creat Ratio 2.6 0.0 - 30.0 mg/g  Basic metabolic panel     Status: None    Collection Time: 07/01/19  9:58 AM  Result Value Ref Range   Sodium 139 135 - 145 mEq/L   Potassium 4.5 3.5 - 5.1 mEq/L   Chloride 99 96 - 112 mEq/L   CO2 30 19 - 32 mEq/L   Glucose, Bld 93 70 - 99 mg/dL   BUN 13 6 - 23 mg/dL   Creatinine, Ser 0.75 0.40 - 1.20 mg/dL   GFR 76.80 >60.00 mL/min   Calcium 9.9 8.4 - 10.5 mg/dL     A/P: In summary, Susan [name] is a 69 year old woman with an extensive past medical history who presents with concerns over increasing forgetfulness and fall. On physical exam, she had trouble with the tandem walking but was otherwise unremarkable.   Pts increased mental fogginess and imbalance is suspected to be from increased amitriptyline dose (+mild "flare" of pt's fibromyalgia) relatively recently and due to chronic bilat knee pain and instability. My plan is to lower her amitriptyline dose back to 25mg  per day and follow up in 3 months to check on if balance issues improve, as the rest of her history and physical are not concerning for Vitamin B12 or Folate deficiency, dementia, hypothyroidism, progression of diabetic neuropathy fatigue/hypoxia from exacerbated OSA, UTI, CVA, or brain tumor. My plan is  Peripheral neuropathy/balance issues: cut dose of amitriptyline to 25mg  qd po (from bid)  HTN: continue meds as indicated  DM: continue metformin as indicated  OSA: follow up on sleep study and continue using CPAP and   Depression/anxiety: continue meds as indicated  Hypothyroidism: continue meds as indicated  COPD: continue meds as indicated  Smoking / obesity / diet: make a follow up appointment to exclusively address lifestyle modifications in depth Osteoarthritis: continue follow up with ortho as indicated (set for next week). Hyperlipidemia: continue statin and lifestyle modifications as indicated  GERD: continue PPI as indicated     Signed: Bennie Dallas, MS3 28 August 2019   I personally was present during the history, physical exam, and medical  decision-making activities of this service and have verified that the service and findings are accurately documented in the student's note. Signed:  Crissie Sickles, MD           08/28/2019

## 2019-08-28 NOTE — Progress Notes (Signed)
See med student note from this date. Signed:  Crissie Sickles, MD           08/28/2019

## 2019-08-31 DIAGNOSIS — M1712 Unilateral primary osteoarthritis, left knee: Secondary | ICD-10-CM | POA: Diagnosis not present

## 2019-08-31 DIAGNOSIS — M7052 Other bursitis of knee, left knee: Secondary | ICD-10-CM | POA: Diagnosis not present

## 2019-08-31 DIAGNOSIS — M7051 Other bursitis of knee, right knee: Secondary | ICD-10-CM | POA: Diagnosis not present

## 2019-08-31 DIAGNOSIS — M7062 Trochanteric bursitis, left hip: Secondary | ICD-10-CM | POA: Diagnosis not present

## 2019-09-01 DIAGNOSIS — M1712 Unilateral primary osteoarthritis, left knee: Secondary | ICD-10-CM | POA: Diagnosis not present

## 2019-09-02 ENCOUNTER — Ambulatory Visit: Payer: Medicare Other

## 2019-09-02 ENCOUNTER — Other Ambulatory Visit: Payer: Self-pay

## 2019-09-02 DIAGNOSIS — G4733 Obstructive sleep apnea (adult) (pediatric): Secondary | ICD-10-CM | POA: Diagnosis not present

## 2019-09-02 HISTORY — PX: OTHER SURGICAL HISTORY: SHX169

## 2019-09-03 DIAGNOSIS — M1712 Unilateral primary osteoarthritis, left knee: Secondary | ICD-10-CM | POA: Diagnosis not present

## 2019-09-05 ENCOUNTER — Other Ambulatory Visit: Payer: Self-pay | Admitting: Family Medicine

## 2019-09-07 DIAGNOSIS — M1712 Unilateral primary osteoarthritis, left knee: Secondary | ICD-10-CM | POA: Diagnosis not present

## 2019-09-07 DIAGNOSIS — G4733 Obstructive sleep apnea (adult) (pediatric): Secondary | ICD-10-CM | POA: Diagnosis not present

## 2019-09-08 ENCOUNTER — Telehealth: Payer: Self-pay | Admitting: Pulmonary Disease

## 2019-09-08 DIAGNOSIS — G4733 Obstructive sleep apnea (adult) (pediatric): Secondary | ICD-10-CM

## 2019-09-08 NOTE — Telephone Encounter (Signed)
Call patient   Sleep study result  Date of study: 09/02/2019   Impression: Moderate obstructive sleep apnea Mild oxygen desaturations  Recommendation: Recommend CPAP therapy for moderate obstructive sleep apnea Auto titrating CPAP with pressure settings of 5-15 will be appropriate  Follow-up as scheduled

## 2019-09-09 NOTE — Telephone Encounter (Signed)
Correction, DME is not [facility] and no information faxed.

## 2019-09-09 NOTE — Telephone Encounter (Signed)
Dr. Ander Slade has reviewed the home sleep test this showed:  Moderate obstructive sleep apnea  Mild oxygen desaturations   Recommendations   Recommend CPAP therapy for moderate obstructive sleep apnea  Treatment options are CPAP with the settings auto titrate 5-15 cm H2O..    Weight loss measures encouraged.  Advise against driving while sleepy & against medication with sedative side effects.    Make appointment for 3 months for compliance with download with Dr. Ander Slade.   Spoke with patient on 09/09/2019, identification verified, patient is ready to move forward with CPAP therapy. Orders for new CPAP placed, information requested by [facility] faxed (HST results and new order).

## 2019-09-09 NOTE — Addendum Note (Signed)
Addended by: Tery Sanfilippo R on: 09/09/2019 09:35 AM   Modules accepted: Orders

## 2019-09-28 DIAGNOSIS — G4733 Obstructive sleep apnea (adult) (pediatric): Secondary | ICD-10-CM | POA: Diagnosis not present

## 2019-10-12 ENCOUNTER — Ambulatory Visit: Payer: Medicare Other | Admitting: Family Medicine

## 2019-10-17 DIAGNOSIS — J0110 Acute frontal sinusitis, unspecified: Secondary | ICD-10-CM | POA: Diagnosis not present

## 2019-10-17 DIAGNOSIS — I10 Essential (primary) hypertension: Secondary | ICD-10-CM | POA: Diagnosis not present

## 2019-10-29 DIAGNOSIS — G4733 Obstructive sleep apnea (adult) (pediatric): Secondary | ICD-10-CM | POA: Diagnosis not present

## 2019-10-31 ENCOUNTER — Other Ambulatory Visit: Payer: Self-pay | Admitting: Family Medicine

## 2019-11-05 ENCOUNTER — Ambulatory Visit: Payer: Medicare Other | Admitting: Family Medicine

## 2019-11-13 ENCOUNTER — Encounter: Payer: Self-pay | Admitting: Family Medicine

## 2019-11-13 ENCOUNTER — Ambulatory Visit (INDEPENDENT_AMBULATORY_CARE_PROVIDER_SITE_OTHER): Payer: Medicare Other | Admitting: Family Medicine

## 2019-11-13 ENCOUNTER — Other Ambulatory Visit: Payer: Self-pay

## 2019-11-13 VITALS — BP 127/72 | HR 72 | Temp 98.1°F | Resp 18 | Ht 63.75 in | Wt 227.5 lb

## 2019-11-13 DIAGNOSIS — I10 Essential (primary) hypertension: Secondary | ICD-10-CM | POA: Diagnosis not present

## 2019-11-13 DIAGNOSIS — M797 Fibromyalgia: Secondary | ICD-10-CM

## 2019-11-13 DIAGNOSIS — E119 Type 2 diabetes mellitus without complications: Secondary | ICD-10-CM | POA: Diagnosis not present

## 2019-11-13 DIAGNOSIS — E039 Hypothyroidism, unspecified: Secondary | ICD-10-CM | POA: Diagnosis not present

## 2019-11-13 DIAGNOSIS — F17200 Nicotine dependence, unspecified, uncomplicated: Secondary | ICD-10-CM

## 2019-11-13 DIAGNOSIS — G4733 Obstructive sleep apnea (adult) (pediatric): Secondary | ICD-10-CM | POA: Diagnosis not present

## 2019-11-13 DIAGNOSIS — R200 Anesthesia of skin: Secondary | ICD-10-CM

## 2019-11-13 DIAGNOSIS — R202 Paresthesia of skin: Secondary | ICD-10-CM

## 2019-11-13 DIAGNOSIS — M79642 Pain in left hand: Secondary | ICD-10-CM

## 2019-11-13 DIAGNOSIS — M79641 Pain in right hand: Secondary | ICD-10-CM

## 2019-11-13 LAB — HEMOGLOBIN A1C: Hgb A1c MFr Bld: 6.3 % (ref 4.6–6.5)

## 2019-11-13 LAB — BASIC METABOLIC PANEL
BUN: 12 mg/dL (ref 6–23)
CO2: 31 mEq/L (ref 19–32)
Calcium: 9.5 mg/dL (ref 8.4–10.5)
Chloride: 98 mEq/L (ref 96–112)
Creatinine, Ser: 0.85 mg/dL (ref 0.40–1.20)
GFR: 66.4 mL/min (ref >60.00)
Glucose, Bld: 111 mg/dL — ABNORMAL HIGH (ref 70–99)
Potassium: 4.4 mEq/L (ref 3.5–5.1)
Sodium: 137 mEq/L (ref 135–145)

## 2019-11-13 LAB — TSH: TSH: 0.85 u[IU]/mL (ref 0.35–4.50)

## 2019-11-13 NOTE — Progress Notes (Signed)
OFFICE VISIT  11/13/2019   CC:  Chief Complaint  Patient presents with  . Hypertension  . Anxiety  . Depression   HPI:    Patient is a 69 y.o. Caucasian female who presents for 3 mo f/u chronic illnesses. A/P as of last visit: "Pts increased mental fogginess and imbalance is suspected to be from increased amitriptyline dose (+mild "flare" of pt's fibromyalgia) relatively recently and due to chronic bilat knee pain and instability. My plan is to lower her amitriptyline dose back to 25mg  per day and follow up in 3 months to check on if balance issues improve, as the rest of her history and physical are not concerning for Vitamin B12 or Folate deficiency, dementia, hypothyroidism, progression of diabetic neuropathy fatigue/hypoxia from exacerbated OSA, UTI, CVA, or brain tumor. My plan is  Peripheral neuropathy/balance issues: cut dose of amitriptyline to 25mg  qd po (from bid)  HTN: continue meds as indicated  DM: continue metformin as indicated  OSA: follow up on sleep study and continue using CPAP and   Depression/anxiety: continue meds as indicated  Hypothyroidism: continue meds as indicated  COPD: continue meds as indicated  Smoking / obesity / diet: make a follow up appointment to exclusively address lifestyle modifications in depth Osteoarthritis: continue follow up with ortho as indicated (set for next week). Hyperlipidemia: continue statin and lifestyle modifications as indicated  GERD: continue PPI as indicated "  INTERIM HX: "Lots of pain" Burning, prickling in both feet--entire foot up to ankle, both sides, pain in both hands, low back pain. Last 2 wks worse but onset of sx's about 4-6 wks ago.  No radiculpathy sx's. Says hands stiff and swollen upon waking up.  Started hurting after doing lots of knitting. Some tingling in upper L back region.  Taking ibup bid helps some. Saw neurologist in the past [facility] (Dr. Tish Frederickson).  She could not tolerate NCS/EMG  procedure.  Pt states concern was for "pinched nerves" as cause of her sx's but unclear if sx's were same as she complains of today.    Saw orthopedist not long after I last saw her, got steroid injection in L knee again, tried PT but could not tolerate having to wear a mask while doing it so she did not do this long at all.  She still smokes about 10 cigs per day.  When she starts thinking about quitting smoking she gets very anxious.  Nicotine replacement failed, chantix caused cognitive side effects/depression/anxiety. Wellbutrin of unclear help in the past. PLans to try nicotine patches soon along with a trip to visit grandkids where she can't smoke in front of them.    She has no home glucose monitoring information today.  NOT fasting today.  ROS: no fevers, no CP, no SOB, no wheezing, no cough, no dizziness, no HAs, no rashes, no melena/hematochezia.  No polyuria or polydipsia.  No redness or swelling of any joints.  No focal weakness,no tremors.  No acute vision or hearing abnormalities. No n/v/d or abd pain.  No palpitations.     Past Medical History:  Diagnosis Date  . Anxiety and depression   . Atypical chest pain 2016   ruled out for MI, stress test showed no ischemia.  CT angio NO PE.  . Cellulitis of chest wall 12/09/2015   s/p breast surgery  . Colon cancer screening 05/11/2018   2007 colonoscopy normal.  Cologuard neg 05/11/18; repeat 3 yrs.  Marland Kitchen COPD (chronic obstructive pulmonary disease) (HCC)    mild;  prn albut per Dr. Lamonte Sakai as of 2017--no f/u w/pulm since then.  . DDD (degenerative disc disease)   . Diabetes mellitus ([place])    w/DPN  . Diabetic peripheral neuropathy ([place])   . DJD (degenerative joint disease)   . Fatty liver disease, nonalcoholic   . Fibromyalgia    cymbalta  . GERD (gastroesophageal reflux disease)   . History of blood clots 2004   during cancer treatment  . History of breast cancer 2004   DCIS->lumpectomy + rad. Eventually go bilat mastectomy in  2017 (Sister had breast cancer in her 57s). Last seen by onc not long after surgery, no further tx or onc f/u needed.  Marland Kitchen History of latent tuberculosis    got approp tx x 9 mo  . History of palpitations 2016   sinus tachycardia.  30 day event monitor was normal (Dr. Irish Lack)  . History of pneumonia   . HTN (hypertension) 01/28/2012  . Hyperlipidemia 01/28/2012  . Hypothyroidism 02/14/2014  . Lipoma 01/05/2018  . Mediastinal adenopathy 2016   on CT angio done for atypical CP.  Has FH of NHL in sister. Pulm saw her 01/2016 and recommended a f/u CT 09/2016 but this doesn't appear to have been done.  Rpt CT 02/2019->resolved.  . Nasal polyp 08/30/2012  . Obesity   . OSA on CPAP   . Osteoarthritis    L knee-"end stage"--Dr. Delfino Lovett  . Osteopenia   . Peripheral vascular disease ([place]) 04   axillary,upper chest after 3 weeks on tamoxifen  . Sialoadenitis of submandibular gland 08/27/2018   LEFT. doxy rx'd by ED. ENT f/u the next day->reassured, dx'd with partial left submandibular gland obstruction w/out sign of infxn; f/u prn.  . Tobacco abuse-unspec 10/12/2013    Past Surgical History:  Procedure Laterality Date  . ABDOMINAL HYSTERECTOMY  1991  . APPENDECTOMY  91  . BILATERAL OOPHORECTOMY  01/2003  . BREAST SURGERY Left 2004   lumpectomy (DCIS)+ radiation  . CARPAL TUNNEL RELEASE     right  . COLONOSCOPY  10/15/2005   Dr. Bronson Ing, poor prep  . INCISION AND DRAINAGE ABSCESS Left 12/13/2015   Procedure: DRAINAGE LEFT MASTECTOMY WOUND INFECTION;  Surgeon: Fanny Skates, MD;  Location: [place];  Service: General;  Laterality: Left;  Marland Kitchen MASTECTOMY W/ SENTINEL NODE BIOPSY Bilateral 10/26/2015   Procedure: RIGHT TOTAL MASTECTOMY WITH RIGHT SENTINEL LYMPH NODE BIOPSY, INJECT BLUE DYE RIGHT BREAST, LEFT BREAST PROPHYLACTIC MASTECTOMY;  Surgeon: Fanny Skates, MD;  Location: [place];  Service: General;  Laterality: Bilateral;  . NASAL SEPTUM SURGERY  08  . PILONIDAL CYST EXCISION    .  THYROIDECTOMY, PARTIAL  mid 80's   right side  . TOTAL KNEE ARTHROPLASTY Right   . TRANSTHORACIC ECHOCARDIOGRAM  05/25/2008   ? mild increase in LV filling pressure, EF 65%, normal LV function/wall motion, no LVH or ventricular enlargement.    Outpatient Medications Prior to Visit  Medication Sig Dispense Refill  . albuterol (PROVENTIL HFA;VENTOLIN HFA) 108 (90 Base) MCG/ACT inhaler INHALE 2 PUFFS INTO THE LUNGS EVERY 4 HOURS AS NEEDED FOR WHEEZING OR SHORTNESS OF BREATH OR COUGH 48 Inhaler 4  . albuterol (PROVENTIL) (2.5 MG/3ML) 0.083% nebulizer solution Take 3 mLs (2.5 mg total) by nebulization every 6 (six) hours as needed for wheezing or shortness of breath. 150 mL 1  . amitriptyline (ELAVIL) 25 MG tablet 1-2 qhs 180 tablet 3  . cholecalciferol (VITAMIN D) 1000 UNITS tablet Take 1,000 Units by mouth daily.    Marland Kitchen  DULoxetine (CYMBALTA) 30 MG capsule TAKE 1 CAPSULES BY MOUTH EVERY DAY W/60 MG CAPSULE 90 capsule 3  . DULoxetine (CYMBALTA) 60 MG capsule 1 cap po qd taken with a duloxetine 30 mg cap 90 capsule 3  . esomeprazole (NEXIUM) 20 MG capsule Take 1 capsule (20 mg total) by mouth daily at 12 noon. 90 capsule 3  . Fluticasone-Salmeterol (ADVAIR DISKUS) 100-50 MCG/DOSE AEPB TAKE 1 PUFF BY MOUTH TWICE A DAY 180 each 1  . hydrochlorothiazide (HYDRODIURIL) 25 MG tablet TAKE 1 TABLET BY MOUTH  DAILY 90 tablet 3  . ibuprofen (ADVIL,MOTRIN) 200 MG tablet Take 800 mg by mouth every 6 (six) hours as needed for headache or mild pain.     Marland Kitchen ipratropium (ATROVENT) 0.03 % nasal spray Place 2 sprays into the nose 4 (four) times daily. 30 mL 1  . levothyroxine (SYNTHROID) 50 MCG tablet TAKE 1 TABLET BY MOUTH  DAILY BEFORE BREAKFAST 90 tablet 3  . lisinopril (ZESTRIL) 10 MG tablet TAKE 1 TABLET BY MOUTH EVERY DAY 90 tablet 0  . metFORMIN (GLUCOPHAGE) 1000 MG tablet TAKE 1 TABLET BY MOUTH  TWICE DAILY WITH MEALS 180 tablet 3  . metoprolol tartrate (LOPRESSOR) 100 MG tablet TAKE 1 TABLET BY MOUTH  TWICE  DAILY 180 tablet 3  . Multiple Vitamins-Minerals (MULTIVITAMIN WITH MINERALS) tablet Take 1 tablet by mouth daily.    . pravastatin (PRAVACHOL) 40 MG tablet TAKE 1 TABLET BY MOUTH  DAILY 90 tablet 0  . tiZANidine (ZANAFLEX) 4 MG tablet TAKE 1 TABLET BY MOUTH AT  BEDTIME 90 tablet 1   No facility-administered medications prior to visit.    Allergies  Allergen Reactions  . Chantix [Varenicline] Other (See Comments)    "Felt like having mental breakdown"  . Ciprofloxacin Other (See Comments)    Muscle tightness, tendon aching and pain  . Latex Itching  . Penicillins Anaphylaxis and Swelling  . Tamoxifen Other (See Comments)    Blood clots  . Levaquin [Levofloxacin] Other (See Comments)    Severe tendon pain  . Lyrica [Pregabalin] Swelling  . Neurontin [Gabapentin] Swelling  . Morphine And Related Other (See Comments)    MORPHINE DRIP - causes pt to feel irritable and itch  . Ceclor [Cefaclor] Rash  . Other Other (See Comments)    Redness from tape and bandaides  . Sulfa Antibiotics Rash    ROS As per HPI  PE: Vitals with BMI 11/13/2019 08/28/2019 08/27/2019  Height 5' 3.75" 5' 3.75" 5' 3.75"  Weight 227 lbs 8 oz 232 lbs 6 oz 235 lbs 10 oz  BMI 39.37 53.61 44.31  Systolic 540 086 761  Diastolic 72 73 70  Pulse 72 65 68  O2 sat on RA today 100%  Gen: Alert, well appearing.  Patient is oriented to person, place, time, and situation. AFFECT: pleasant, lucid thought and speech. PJK:DTOI: no injection, icteris, swelling, or exudate.  EOMI, PERRLA. Mouth: lips without lesion/swelling.  Oral mucosa pink and moist. Oropharynx without erythema, exudate, or swelling.  CV: RRR, no m/r/g.   LUNGS: CTA bilat, nonlabored resps, good aeration in all lung fields. Back: nontender. ROM intact. LE strength 5/5 bilat. Feet: normal sensation, with no swelling or erythema or impairment of ROM. No skin lesions.  DP and PT pulses intact bilat. Hands without any swelling, erythema, warmth, or  tenderness.  ROM of fingers and wrists normal.   LABS:  Lab Results  Component Value Date   TSH 1.88 02/16/2019   Lab Results  Component Value Date   WBC 8.0 08/26/2018   HGB 14.3 08/26/2018   HCT 41.6 08/26/2018   MCV 85.8 08/26/2018   PLT 268 08/26/2018   Lab Results  Component Value Date   CREATININE 0.75 07/01/2019   BUN 13 07/01/2019   NA 139 07/01/2019   K 4.5 07/01/2019   CL 99 07/01/2019   CO2 30 07/01/2019   Lab Results  Component Value Date   ALT 33 06/16/2019   AST 19 06/16/2019   ALKPHOS 62 06/16/2019   BILITOT 0.4 06/16/2019   Lab Results  Component Value Date   CHOL 125 06/16/2019   Lab Results  Component Value Date   HDL 42.60 06/16/2019   Lab Results  Component Value Date   LDLCALC 55 06/16/2019   Lab Results  Component Value Date   TRIG 137.0 06/16/2019   Lab Results  Component Value Date   CHOLHDL 3 06/16/2019   Lab Results  Component Value Date   HGBA1C 6.5 06/16/2019   IMPRESSION AND PLAN:  1) Diffuse soft tissue pain: fibromyalgia.  Cymbalta at 90mg  a day dosing. 2) Bilat feet paresthesias, chronic, waxing and waning intensity. 3) Bilat hands pain, w/out symptoms or signs of inflammatory arthritis.  Reassured--suspect all current sx's are a part of her fibromyalgia syndrome + some overuse of hands --knitting a lot + DPN. I don't think there is any need to go back to the neurologist at this time, esp since she was unable to tolerate NCS/EMG in the past. No new meds today. She is going to focus on quitting smoking. TSH, A1c, BMET today.  Spent 40 min with pt today, with >50% of this time spent in counseling and care coordination regarding the above problems.  An After Visit Summary was printed and given to the patient.  FOLLOW UP: Return in about 3 months (around 02/13/2020) for routine chronic illness f/u. Next cpe 06/2020  Signed:  Crissie Sickles, MD           11/13/2019

## 2019-11-17 DIAGNOSIS — M1712 Unilateral primary osteoarthritis, left knee: Secondary | ICD-10-CM | POA: Insufficient documentation

## 2019-11-20 DIAGNOSIS — M1712 Unilateral primary osteoarthritis, left knee: Secondary | ICD-10-CM | POA: Diagnosis not present

## 2019-11-22 ENCOUNTER — Other Ambulatory Visit: Payer: Self-pay | Admitting: Family Medicine

## 2019-11-23 ENCOUNTER — Other Ambulatory Visit: Payer: Self-pay

## 2019-11-23 MED ORDER — LISINOPRIL 10 MG PO TABS: 10.0000 mg | ORAL_TABLET | Freq: Every day | ORAL | 0 refills | Status: DC

## 2019-11-27 ENCOUNTER — Other Ambulatory Visit: Payer: Self-pay | Admitting: Family Medicine

## 2019-11-27 DIAGNOSIS — M797 Fibromyalgia: Secondary | ICD-10-CM

## 2019-11-28 DIAGNOSIS — G4733 Obstructive sleep apnea (adult) (pediatric): Secondary | ICD-10-CM | POA: Diagnosis not present

## 2019-12-13 ENCOUNTER — Other Ambulatory Visit: Payer: Self-pay | Admitting: Family Medicine

## 2019-12-13 DIAGNOSIS — M797 Fibromyalgia: Secondary | ICD-10-CM

## 2019-12-14 NOTE — Telephone Encounter (Signed)
RF request for cymbalta 30mg   LOV: 11/13/19 Next ov: 02/15/20 Last written: 08/12/19 (270,3)  Verified with pt that she is taking the 30 mg tablets, three tablets once daily. Pt states she has been taking Rx this way for a while.  Please advise if Rx refill is appropriate.

## 2019-12-22 ENCOUNTER — Telehealth: Payer: Self-pay | Admitting: Family Medicine

## 2019-12-22 NOTE — Telephone Encounter (Signed)
Left message for patient to schedule Annual Wellness Visit.  Please schedule with Nurse Health Advisor Caroleen Hamman, RN at [facility]

## 2019-12-24 ENCOUNTER — Other Ambulatory Visit: Payer: Self-pay | Admitting: Family Medicine

## 2019-12-29 DIAGNOSIS — G4733 Obstructive sleep apnea (adult) (pediatric): Secondary | ICD-10-CM | POA: Diagnosis not present

## 2020-01-09 ENCOUNTER — Other Ambulatory Visit: Payer: Self-pay | Admitting: Family Medicine

## 2020-01-24 ENCOUNTER — Other Ambulatory Visit: Payer: Self-pay | Admitting: Family Medicine

## 2020-01-26 ENCOUNTER — Ambulatory Visit: Payer: Medicare Other | Admitting: Family Medicine

## 2020-01-26 ENCOUNTER — Encounter: Payer: Self-pay | Admitting: Family Medicine

## 2020-01-26 NOTE — Progress Notes (Deleted)
OFFICE VISIT  01/26/2020  CC: No chief complaint on file.   HPI:    Patient is a 69 y.o. Caucasian female who presents for f/u fibromyalgia, HTN, and DM with DPN.  Past Medical History:  Diagnosis Date  . Anxiety and depression   . Atypical chest pain 2016   ruled out for MI, stress test showed no ischemia.  CT angio NO PE.  . Cellulitis of chest wall 12/09/2015   s/p breast surgery  . Colon cancer screening 05/11/2018   2007 colonoscopy normal.  Cologuard neg 05/11/18; repeat 3 yrs.  Marland Kitchen COPD (chronic obstructive pulmonary disease) (HCC)    mild; prn albut per Dr. Lamonte Sakai as of 2017--no f/u w/pulm since then.  . DDD (degenerative disc disease)   . Diabetes mellitus ([place])    w/DPN  . Diabetic peripheral neuropathy ([place])   . DJD (degenerative joint disease)   . Fatty liver disease, nonalcoholic   . Fibromyalgia    cymbalta  . GERD (gastroesophageal reflux disease)   . History of blood clots 2004   during cancer treatment  . History of breast cancer 2004   DCIS->lumpectomy + rad. Eventually go bilat mastectomy in 2017 (Sister had breast cancer in her 30s). Last seen by onc not long after surgery, no further tx or onc f/u needed.  Marland Kitchen History of latent tuberculosis    got approp tx x 9 mo  . History of palpitations 2016   sinus tachycardia.  30 day event monitor was normal (Dr. Irish Lack)  . History of pneumonia   . HTN (hypertension) 01/28/2012  . Hyperlipidemia 01/28/2012  . Hypothyroidism 02/14/2014  . Lipoma 01/05/2018  . Mediastinal adenopathy 2016   on CT angio done for atypical CP.  Has FH of NHL in sister. Pulm saw her 01/2016 and recommended a f/u CT 09/2016 but this doesn't appear to have been done.  Rpt CT 02/2019->resolved.  . Nasal polyp 08/30/2012  . Obesity   . OSA on CPAP   . Osteoarthritis    L knee-"end stage"--Dr. Delfino Lovett  . Osteopenia   . Peripheral vascular disease ([place]) 04   axillary,upper chest after 3 weeks on tamoxifen  . Sialoadenitis of submandibular  gland 08/27/2018   LEFT. doxy rx'd by ED. ENT f/u the next day->reassured, dx'd with partial left submandibular gland obstruction w/out sign of infxn; f/u prn.  . Tobacco abuse-unspec 10/12/2013    Past Surgical History:  Procedure Laterality Date  . ABDOMINAL HYSTERECTOMY  1991  . APPENDECTOMY  91  . BILATERAL OOPHORECTOMY  01/2003  . BREAST SURGERY Left 2004   lumpectomy (DCIS)+ radiation  . CARPAL TUNNEL RELEASE     right  . COLONOSCOPY  10/15/2005   Dr. Bronson Ing, poor prep  . INCISION AND DRAINAGE ABSCESS Left 12/13/2015   Procedure: DRAINAGE LEFT MASTECTOMY WOUND INFECTION;  Surgeon: Fanny Skates, MD;  Location: [place];  Service: General;  Laterality: Left;  Marland Kitchen MASTECTOMY W/ SENTINEL NODE BIOPSY Bilateral 10/26/2015   Procedure: RIGHT TOTAL MASTECTOMY WITH RIGHT SENTINEL LYMPH NODE BIOPSY, INJECT BLUE DYE RIGHT BREAST, LEFT BREAST PROPHYLACTIC MASTECTOMY;  Surgeon: Fanny Skates, MD;  Location: [place];  Service: General;  Laterality: Bilateral;  . NASAL SEPTUM SURGERY  08  . PILONIDAL CYST EXCISION    . THYROIDECTOMY, PARTIAL  mid 80's   right side  . TOTAL KNEE ARTHROPLASTY Right   . TRANSTHORACIC ECHOCARDIOGRAM  05/25/2008   ? mild increase in LV filling pressure, EF 65%, normal LV function/wall motion, no LVH or  ventricular enlargement.    Outpatient Medications Prior to Visit  Medication Sig Dispense Refill  . albuterol (PROVENTIL HFA;VENTOLIN HFA) 108 (90 Base) MCG/ACT inhaler INHALE 2 PUFFS INTO THE LUNGS EVERY 4 HOURS AS NEEDED FOR WHEEZING OR SHORTNESS OF BREATH OR COUGH 48 Inhaler 4  . albuterol (PROVENTIL) (2.5 MG/3ML) 0.083% nebulizer solution Take 3 mLs (2.5 mg total) by nebulization every 6 (six) hours as needed for wheezing or shortness of breath. 150 mL 1  . amitriptyline (ELAVIL) 25 MG tablet 1-2 qhs 180 tablet 3  . cholecalciferol (VITAMIN D) 1000 UNITS tablet Take 1,000 Units by mouth daily.    . DULoxetine (CYMBALTA) 30 MG capsule TAKE 3 CAPSULES BY MOUTH   DAILY 270 capsule 3  . DULoxetine (CYMBALTA) 60 MG capsule 1 cap po qd taken with a duloxetine 30 mg cap 90 capsule 3  . esomeprazole (NEXIUM) 20 MG capsule Take 1 capsule (20 mg total) by mouth daily at 12 noon. 90 capsule 3  . Fluticasone-Salmeterol (ADVAIR DISKUS) 100-50 MCG/DOSE AEPB TAKE 1 PUFF BY MOUTH TWICE A DAY 180 each 1  . hydrochlorothiazide (HYDRODIURIL) 25 MG tablet TAKE 1 TABLET BY MOUTH  DAILY 90 tablet 3  . ibuprofen (ADVIL,MOTRIN) 200 MG tablet Take 800 mg by mouth every 6 (six) hours as needed for headache or mild pain.     Marland Kitchen ipratropium (ATROVENT) 0.03 % nasal spray Place 2 sprays into the nose 4 (four) times daily. 30 mL 1  . levothyroxine (SYNTHROID) 50 MCG tablet TAKE 1 TABLET BY MOUTH  DAILY BEFORE BREAKFAST 90 tablet 3  . lisinopril (ZESTRIL) 10 MG tablet Take 1 tablet (10 mg total) by mouth daily. 90 tablet 0  . metFORMIN (GLUCOPHAGE) 1000 MG tablet TAKE 1 TABLET BY MOUTH  TWICE DAILY WITH MEALS 180 tablet 3  . metoprolol tartrate (LOPRESSOR) 100 MG tablet TAKE 1 TABLET BY MOUTH  TWICE DAILY 180 tablet 0  . Multiple Vitamins-Minerals (MULTIVITAMIN WITH MINERALS) tablet Take 1 tablet by mouth daily.    . pravastatin (PRAVACHOL) 40 MG tablet TAKE 1 TABLET BY MOUTH  DAILY 90 tablet 1  . tiZANidine (ZANAFLEX) 4 MG tablet TAKE 1 TABLET BY MOUTH AT  BEDTIME 90 tablet 1   No facility-administered medications prior to visit.    Allergies  Allergen Reactions  . Chantix [Varenicline] Other (See Comments)    "Felt like having mental breakdown"  . Ciprofloxacin Other (See Comments)    Muscle tightness, tendon aching and pain  . Latex Itching  . Penicillins Anaphylaxis and Swelling  . Tamoxifen Other (See Comments)    Blood clots  . Levaquin [Levofloxacin] Other (See Comments)    Severe tendon pain  . Lyrica [Pregabalin] Swelling  . Neurontin [Gabapentin] Swelling  . Morphine And Related Other (See Comments)    MORPHINE DRIP - causes pt to feel irritable and itch  .  Ceclor [Cefaclor] Rash  . Other Other (See Comments)    Redness from tape and bandaides  . Sulfa Antibiotics Rash    ROS As per HPI  PE: Vitals with BMI 11/13/2019 08/28/2019 08/27/2019  Height 5' 3.75" 5' 3.75" 5' 3.75"  Weight 227 lbs 8 oz 232 lbs 6 oz 235 lbs 10 oz  BMI 39.37 52.77 82.42  Systolic 353 614 431  Diastolic 72 73 70  Pulse 72 65 68     ***  LABS:  Lab Results  Component Value Date   TSH 0.85 11/13/2019   Lab Results  Component Value Date  WBC 8.0 08/26/2018   HGB 14.3 08/26/2018   HCT 41.6 08/26/2018   MCV 85.8 08/26/2018   PLT 268 08/26/2018   Lab Results  Component Value Date   CREATININE 0.85 11/13/2019   BUN 12 11/13/2019   NA 137 11/13/2019   K 4.4 11/13/2019   CL 98 11/13/2019   CO2 31 11/13/2019   Lab Results  Component Value Date   ALT 33 06/16/2019   AST 19 06/16/2019   ALKPHOS 62 06/16/2019   BILITOT 0.4 06/16/2019   Lab Results  Component Value Date   CHOL 125 06/16/2019   Lab Results  Component Value Date   HDL 42.60 06/16/2019   Lab Results  Component Value Date   LDLCALC 55 06/16/2019   Lab Results  Component Value Date   TRIG 137.0 06/16/2019   Lab Results  Component Value Date   CHOLHDL 3 06/16/2019   Lab Results  Component Value Date   HGBA1C 6.3 11/13/2019   IMPRESSION AND PLAN:  No problem-specific Assessment & Plan notes found for this encounter.  Flu No labs needed (?FLP, ? BMET) An After Visit Summary was printed and given to the patient.  FOLLOW UP: No follow-ups on file. cpe 6 mo  Signed:  Crissie Sickles, MD           01/26/2020

## 2020-01-26 NOTE — Progress Notes (Signed)
Subjective:   Susan [name] is a 69 y.o. female who presents for Medicare Annual (Subsequent) preventive examination.  I connected with Sarinah today by telephone and verified that I am speaking with the correct person using two identifiers. Location patient: home Location provider: work Persons participating in the virtual visit: patient, Marine scientist.    I discussed the limitations, risks, security and privacy concerns of performing an evaluation and management service by telephone and the availability of in person appointments. I also discussed with the patient that there may be a patient responsible charge related to this service. The patient expressed understanding and verbally consented to this telephonic visit.    Interactive audio and video telecommunications were attempted between this provider and patient, however failed, due to patient having technical difficulties OR patient did not have access to video capability.  We continued and completed visit with audio only.  Some vital signs may be absent or patient reported.   Time Spent with patient on telephone encounter: 30 minutes  Review of Systems     Cardiac Risk Factors include: advanced age (>75men, >50 women);diabetes mellitus;dyslipidemia;hypertension;obesity (BMI >30kg/m2);sedentary lifestyle;smoking/ tobacco exposure     Objective:    Today's Vitals   01/27/20 0859  Weight: 222 lb (100.7 kg)  Height: 5\' 3"  (1.6 m)   Body mass index is 39.33 kg/m.  Advanced Directives 01/27/2020 08/26/2018 02/06/2016 01/04/2016 12/27/2015 12/09/2015 10/26/2015  Does Patient Have a Medical Advance Directive? No No - No No No No  Would patient like information on creating a medical advance directive? No - Patient declined No - Patient declined No - patient declined information No - patient declined information - No - patient declined information No - patient declined information    Current Medications (verified) Outpatient Encounter  Medications as of 01/27/2020  Medication Sig  . albuterol (PROVENTIL HFA;VENTOLIN HFA) 108 (90 Base) MCG/ACT inhaler INHALE 2 PUFFS INTO THE LUNGS EVERY 4 HOURS AS NEEDED FOR WHEEZING OR SHORTNESS OF BREATH OR COUGH  . albuterol (PROVENTIL) (2.5 MG/3ML) 0.083% nebulizer solution Take 3 mLs (2.5 mg total) by nebulization every 6 (six) hours as needed for wheezing or shortness of breath.  Marland Kitchen amitriptyline (ELAVIL) 25 MG tablet 1-2 qhs  . DULoxetine (CYMBALTA) 30 MG capsule TAKE 3 CAPSULES BY MOUTH  DAILY  . DULoxetine (CYMBALTA) 60 MG capsule 1 cap po qd taken with a duloxetine 30 mg cap  . esomeprazole (NEXIUM) 20 MG capsule Take 1 capsule (20 mg total) by mouth daily at 12 noon.  . Fluticasone-Salmeterol (ADVAIR DISKUS) 100-50 MCG/DOSE AEPB TAKE 1 PUFF BY MOUTH TWICE A DAY  . hydrochlorothiazide (HYDRODIURIL) 25 MG tablet TAKE 1 TABLET BY MOUTH  DAILY  . ibuprofen (ADVIL,MOTRIN) 200 MG tablet Take 800 mg by mouth every 6 (six) hours as needed for headache or mild pain.   Marland Kitchen ipratropium (ATROVENT) 0.03 % nasal spray Place 2 sprays into the nose 4 (four) times daily.  Marland Kitchen levothyroxine (SYNTHROID) 50 MCG tablet TAKE 1 TABLET BY MOUTH  DAILY BEFORE BREAKFAST  . lisinopril (ZESTRIL) 10 MG tablet Take 1 tablet (10 mg total) by mouth daily.  . metFORMIN (GLUCOPHAGE) 1000 MG tablet TAKE 1 TABLET BY MOUTH  TWICE DAILY WITH MEALS  . metoprolol tartrate (LOPRESSOR) 100 MG tablet TAKE 1 TABLET BY MOUTH  TWICE DAILY  . Multiple Vitamins-Minerals (MULTIVITAMIN WITH MINERALS) tablet Take 1 tablet by mouth daily.  . pravastatin (PRAVACHOL) 40 MG tablet TAKE 1 TABLET BY MOUTH  DAILY  . cholecalciferol (VITAMIN  D) 1000 UNITS tablet Take 1,000 Units by mouth daily.  Marland Kitchen tiZANidine (ZANAFLEX) 4 MG tablet TAKE 1 TABLET BY MOUTH AT  BEDTIME (Patient not taking: Reported on 01/27/2020)   No facility-administered encounter medications on file as of 01/27/2020.    Allergies (verified) Chantix [varenicline], Ciprofloxacin,  Latex, Penicillins, Tamoxifen, Levaquin [levofloxacin], Lyrica [pregabalin], Neurontin [gabapentin], Morphine and related, Ceclor [cefaclor], Other, and Sulfa antibiotics   History: Past Medical History:  Diagnosis Date  . Anxiety and depression   . Atypical chest pain 2016   ruled out for MI, stress test showed no ischemia.  CT angio NO PE.  . Cellulitis of chest wall 12/09/2015   s/p breast surgery  . Colon cancer screening 05/11/2018   2007 colonoscopy normal.  Cologuard neg 05/11/18; repeat 3 yrs.  Marland Kitchen COPD (chronic obstructive pulmonary disease) (HCC)    mild; prn albut per Dr. Lamonte Sakai as of 2017--no f/u w/pulm since then.  . DDD (degenerative disc disease)   . Diabetes mellitus ([place])    w/DPN  . Diabetic peripheral neuropathy ([place])   . DJD (degenerative joint disease)   . Fatty liver disease, nonalcoholic   . Fibromyalgia    cymbalta  . GERD (gastroesophageal reflux disease)   . History of blood clots 2004   during cancer treatment  . History of breast cancer 2004   DCIS->lumpectomy + rad. Eventually go bilat mastectomy in 2017 (Sister had breast cancer in her 68s). Last seen by onc not long after surgery, no further tx or onc f/u needed.  Marland Kitchen History of latent tuberculosis    got approp tx x 9 mo  . History of palpitations 2016   sinus tachycardia.  30 day event monitor was normal (Dr. Irish Lack)  . History of pneumonia   . HTN (hypertension) 01/28/2012  . Hyperlipidemia 01/28/2012  . Hypothyroidism 02/14/2014  . Lipoma 01/05/2018  . Mediastinal adenopathy 2016   on CT angio done for atypical CP.  Has FH of NHL in sister. Pulm saw her 01/2016 and recommended a f/u CT 09/2016 but this doesn't appear to have been done.  Rpt CT 02/2019->resolved.  . Nasal polyp 08/30/2012  . Obesity   . OSA on CPAP   . Osteoarthritis    L knee-"end stage"--Dr. Delfino Lovett  . Osteopenia   . Peripheral vascular disease ([place]) 04   axillary,upper chest after 3 weeks on tamoxifen  . Sialoadenitis of  submandibular gland 08/27/2018   LEFT. doxy rx'd by ED. ENT f/u the next day->reassured, dx'd with partial left submandibular gland obstruction w/out sign of infxn; f/u prn.  . Tobacco abuse-unspec 10/12/2013   Past Surgical History:  Procedure Laterality Date  . ABDOMINAL HYSTERECTOMY  1991  . APPENDECTOMY  91  . BILATERAL OOPHORECTOMY  01/2003  . BREAST SURGERY Left 2004   lumpectomy (DCIS)+ radiation  . CARPAL TUNNEL RELEASE     right  . COLONOSCOPY  10/15/2005   Dr. Bronson Ing, poor prep  . INCISION AND DRAINAGE ABSCESS Left 12/13/2015   Procedure: DRAINAGE LEFT MASTECTOMY WOUND INFECTION;  Surgeon: Fanny Skates, MD;  Location: [facility];  Service: General;  Laterality: Left;  Marland Kitchen MASTECTOMY W/ SENTINEL NODE BIOPSY Bilateral 10/26/2015   Procedure: RIGHT TOTAL MASTECTOMY WITH RIGHT SENTINEL LYMPH NODE BIOPSY, INJECT BLUE DYE RIGHT BREAST, LEFT BREAST PROPHYLACTIC MASTECTOMY;  Surgeon: Fanny Skates, MD;  Location: [facility];  Service: General;  Laterality: Bilateral;  . NASAL SEPTUM SURGERY  08  . PILONIDAL CYST EXCISION    . THYROIDECTOMY, PARTIAL  mid  80's   right side  . TOTAL KNEE ARTHROPLASTY Right   . TRANSTHORACIC ECHOCARDIOGRAM  05/25/2008   ? mild increase in LV filling pressure, EF 65%, normal LV function/wall motion, no LVH or ventricular enlargement.   Family History  Problem Relation Age of Onset  . Cancer Mother 110       liver cancer, hep c  . Heart disease Mother        chf  . Hypertension Mother   . Hyperlipidemia Mother   . Osteoporosis Mother   . Cancer Father 6       lung; smoker  . COPD Father   . Heart disease Sister        mvp  . Breast cancer Sister        dx. 55s  . Non-Hodgkin's lymphoma Sister 34       large B cell  . Hypertension Brother   . Arthritis Brother   . Kidney disease Brother   . Prostate cancer Brother        dx. early 16s  . Other Daughter        Beal's Syndrome  . Arthritis Daughter        Beal's  . Arthritis Son        Beal's  connective tissue disease  . Vision loss Maternal Grandmother   . Dementia Maternal Grandmother   . Coronary artery disease Maternal Grandmother   . Heart Problems Maternal Grandmother   . Vision loss Paternal Grandfather   . Hypertension Brother   . Benign prostatic hyperplasia Brother   . Leukemia Brother        chronic lymphatic leukemia - small/B cell  . Hypertension Brother   . COPD Brother   . Prostate cancer Brother        dx. mid-60s  . Myelodysplastic syndrome Brother        dx. early 59s  . Hypertension Brother   . Hyperlipidemia Brother   . Prostate cancer Brother        dx. mid-70s  . Melanoma Brother        (x2) melanomas dx. late 60s-early 30s  . Mental illness Brother        schizophrenia  . Breast cancer Brother        dx. 18s  . Cirrhosis Brother        hep c  . Hypertension Sister   . Other Sister        thalessemia, anemia; hx of hysterectomy in her 77s  . Hyperlipidemia Sister   . Gout Sister   . Diverticulitis Sister   . Other Daughter        overweight  . Breast cancer Maternal Aunt 38  . Lung cancer Cousin        maternal 1st cousin dx. 67 or younger; former smoker  . Colon cancer Cousin        maternal 1st cousin dx. late 21s-early 60s  . Cancer Cousin        maternal 1st cousin d. NOS cancer  . Emphysema Paternal Aunt   . Lung cancer Paternal Aunt        d. early 84s; smoker  . Leukemia Cousin        paternal 1st cousin d. early 19s  . Colon cancer Other 81       niece  . Melanoma Other        niece   Social History   Socioeconomic History  . Marital status: Married  Spouse name: Not on file  . Number of children: Not on file  . Years of education: Not on file  . Highest education level: Not on file  Occupational History  . Not on file  Tobacco Use  . Smoking status: Current Every Day Smoker    Packs/day: 1.00    Years: 35.00    Pack years: 35.00    Types: Cigarettes  . Smokeless tobacco: Never Used  Vaping Use  .  Vaping Use: Never used  Substance and Sexual Activity  . Alcohol use: No    Alcohol/week: 0.0 standard drinks  . Drug use: No  . Sexual activity: Never  Other Topics Concern  . Not on file  Social History Narrative   Married, 3 children.  Two have Beal's syndrome (connective tissue d/o similar to Marfan's).   Orig from [place].  [place] since 17s.   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   Tobacco (current as of 08/2018).   No alcohol.   Social Determinants of Health   Financial Resource Strain: Low Risk   . Difficulty of Paying Living Expenses: Not hard at all  Food Insecurity: No Food Insecurity  . Worried About Charity fundraiser in the Last Year: Never true  . Ran Out of Food in the Last Year: Never true  Transportation Needs: No Transportation Needs  . Lack of Transportation (Medical): No  . Lack of Transportation (Non-Medical): No  Physical Activity: Inactive  . Days of Exercise per Week: 0 days  . Minutes of Exercise per Session: 0 min  Stress: No Stress Concern Present  . Feeling of Stress : Not at all  Social Connections: Socially Integrated  . Frequency of Communication with Friends and Family: More than three times a week  . Frequency of Social Gatherings with Friends and Family: Once a week  . Attends Religious Services: More than 4 times per year  . Active Member of Clubs or Organizations: Yes  . Attends Archivist Meetings: More than 4 times per year  . Marital Status: Married    Tobacco Counseling Ready to quit: Not Answered Counseling given: Not Answered   Clinical Intake:  Pre-visit preparation completed: Yes  Pain : No/denies pain (Patient denies pain at this time but states she has frequent nerve type pain in her lower extremeties & in her upper back & neck area)     Nutritional Status: BMI > 30  Obese Nutritional Risks: None Diabetes: Yes CBG done?: No Did pt. bring in  CBG monitor from home?: No (phone visit)  How often do you need to have someone help you when you read instructions, pamphlets, or other written materials from your doctor or pharmacy?: 1 - Never What is the last grade level you completed in school?: 2 Associate's degrees  Diabetic?Yes  Diabetes:  Is the patient diabetic?  Yes  If diabetic, was a CBG obtained today?  No  Did the patient bring in their glucometer from home?  No phone visit How often do you monitor your CBG's? never.   Financial Strains and Diabetes Management:  Are you having any financial strains with the device, your supplies or your medication? No .  Does the patient want to be seen by Chronic Care Management for management of their diabetes?  No  Would the patient like to be referred to a Nutritionist or for Diabetic Management?  No   Diabetic Exams:  Diabetic  Eye Exam: Completed 02/19/2019.   Diabetic Foot Exam: Completed 06/16/2019.   Interpreter Needed?: No  Information entered by :: Caroleen Hamman LPN   Activities of Daily Living In your present state of health, do you have any difficulty performing the following activities: 01/27/2020  Hearing? Y  Comment hearing loss  Vision? N  Difficulty concentrating or making decisions? N  Walking or climbing stairs? N  Dressing or bathing? N  Doing errands, shopping? N  Preparing Food and eating ? N  Using the Toilet? N  In the past six months, have you accidently leaked urine? N  Do you have problems with loss of bowel control? N  Managing your Medications? N  Managing your Finances? N  Housekeeping or managing your Housekeeping? N  Some recent data might be hidden    Patient Care Team: Tammi Sou, MD as PCP - General (Family Medicine) Fanny Skates, MD as Consulting Physician (General Surgery) Nicholas Lose, MD as Consulting Physician (Hematology and Oncology) Collene Gobble, MD as Consulting Physician (Pulmonary Disease) Rozetta Nunnery, MD as Consulting Physician (Otolaryngology) Debbra Riding, MD as Consulting Physician (Ophthalmology) Laurin Coder, MD as Consulting Physician (Pulmonary Disease)  Indicate any recent Medical Services you may have received from other than [facility] providers in the past year (date may be approximate).     Assessment:   This is a routine wellness examination for Tekia.  Hearing/Vision screen  Hearing Screening   125Hz  250Hz  500Hz  1000Hz  2000Hz  3000Hz  4000Hz  6000Hz  8000Hz   Right ear:           Left ear:           Comments: Sees Dr. Lucia Gaskins for hearing loss  Vision Screening Comments: Wears glasses Last eye exam-02/19/2019 Dr. Katy Fitch  Dietary issues and exercise activities discussed: Current Exercise Habits: The patient does not participate in regular exercise at present, Exercise limited by: orthopedic condition(s)  Goals    . Patient Stated     Would like to eat healthier, lose weight & drink more water      Depression Screen PHQ 2/9 Scores 01/27/2020 11/13/2019 06/16/2019 08/29/2018 05/20/2018 10/31/2017 10/08/2017  PHQ - 2 Score 0 0 0 0 0 0 0  PHQ- 9 Score - 5 0 - - - -    Fall Risk Fall Risk  01/27/2020 08/29/2018 05/20/2018 04/09/2018 10/31/2017  Falls in the past year? 1 0 1 0 Yes  Number falls in past yr: 1 0 0 - 1  Injury with Fall? 0 0 - - Yes  Risk for fall due to : History of fall(s) - - - -  Follow up Falls prevention discussed - - - -    Any stairs in or around the home? Yes  If so, are there any without handrails? No  Home free of loose throw rugs in walkways, pet beds, electrical cords, etc? Yes  Adequate lighting in your home to reduce risk of falls? Yes   ASSISTIVE DEVICES UTILIZED TO PREVENT FALLS:  Life alert? No  Use of a cane, walker or w/c? No  Grab bars in the bathroom? Yes  Shower chair or bench in shower? No  Elevated toilet seat or a handicapped toilet? No   TIMED UP AND GO:  Was the test performed? No . Phone  visit   Cognitive Function:No cognitive impairment noted.         Immunizations Immunization History  Administered Date(s) Administered  . Influenza Split 01/28/2012  . Influenza, High Dose  Seasonal PF 02/25/2018  . Influenza, Quadrivalent, Recombinant, Inj, Pf 02/10/2019  . Influenza,inj,Quad PF,6+ Mos 01/09/2013, 02/12/2014, 04/12/2015, 01/25/2016, 01/24/2017  . PFIZER SARS-COV-2 Vaccination 07/06/2019, 07/27/2019  . Pneumococcal Conjugate-13 03/09/2013  . Pneumococcal Polysaccharide-23 09/29/2014  . Tdap 12/26/2012  . Zoster 12/20/2014    TDAP status: Up to date   Flu Vaccine status: Declined, Education has been provided regarding the importance of this vaccine but patient still declined. Advised may receive this vaccine at local pharmacy or Health Dept. Aware to provide a copy of the vaccination record if obtained from local pharmacy or Health Dept. Verbalized acceptance and understanding.   Pneumococcal vaccine status: Up to date   Covid-19 vaccine status: Completed vaccines  Qualifies for Shingles Vaccine? Yes   Zostavax completed Yes   Shingrix Completed?: No.    Education has been provided regarding the importance of this vaccine. Patient has been advised to call insurance company to determine out of pocket expense if they have not yet received this vaccine. Advised may also receive vaccine at local pharmacy or Health Dept. Verbalized acceptance and understanding.  Screening Tests Health Maintenance  Topic Date Due  . PNA vac Low Risk Adult (2 of 2 - PPSV23) 09/29/2019  . INFLUENZA VACCINE  12/06/2019  . OPHTHALMOLOGY EXAM  02/19/2020  . HEMOGLOBIN A1C  05/15/2020  . FOOT EXAM  06/15/2020  . Fecal DNA (Cologuard)  05/15/2021  . TETANUS/TDAP  12/27/2022  . DEXA SCAN  Completed  . COVID-19 Vaccine  Completed  . Hepatitis C Screening  Addressed  . MAMMOGRAM  Discontinued    Health Maintenance  Health Maintenance Due  Topic Date Due  . PNA vac Low Risk Adult  (2 of 2 - PPSV23) 09/29/2019  . INFLUENZA VACCINE  12/06/2019    Colorectal cancer screening: Completed Cologuard 05/15/2018. Repeat every 3 years   Mammogram status: No longer required.  Patient has had a double mastectomy  Bone Density Status: Due- Patient states she will call back when she is ready to schedule.  Cancer Screening: (Low Dose CT Chest recommended if Age 76-80 years, 30 pack-year currently smoking OR have quit w/in 15years.) does not qualify. Chest CT done 02/23/2019   Additional Screening:  Hepatitis C Screening: Completed 05/07/2008  Vision Screening: Recommended annual ophthalmology exams for early detection of glaucoma and other disorders of the eye. Is the patient up to date with their annual eye exam?  Yes  Who is the provider or what is the name of the office in which the patient attends annual eye exams? Dr. Katy Fitch   Dental Screening: Recommended annual dental exams for proper oral hygiene  Community Resource Referral / Chronic Care Management: CRR required this visit?  No   CCM required this visit?  No      Plan:     I have personally reviewed and noted the following in the patient's chart:   . Medical and social history . Use of alcohol, tobacco or illicit drugs  . Current medications and supplements . Functional ability and status . Nutritional status . Physical activity . Advanced directives . List of other physicians . Hospitalizations, surgeries, and ER visits in previous 12 months . Vitals . Screenings to include cognitive, depression, and falls . Referrals and appointments  In addition, I have reviewed and discussed with patient certain preventive protocols, quality metrics, and best practice recommendations. A written personalized care plan for preventive services as well as general preventive health recommendations were provided to patient.    Due  to this being a telephonic visit, the after visit summary with patients personalized plan  was offered to patient via mail or my-chart.  Patient would like to access on my-chart.   Marta Antu, LPN   5/68/1275  Nurse Health Advisor  Nurse Notes: None

## 2020-01-27 ENCOUNTER — Ambulatory Visit (INDEPENDENT_AMBULATORY_CARE_PROVIDER_SITE_OTHER): Payer: Medicare Other

## 2020-01-27 VITALS — Ht 63.0 in | Wt 222.0 lb

## 2020-01-27 DIAGNOSIS — Z0000 Encounter for general adult medical examination without abnormal findings: Secondary | ICD-10-CM

## 2020-01-27 NOTE — Patient Instructions (Signed)
Susan [name] , Thank you for taking time to complete your Medicare Wellness Visit. I appreciate your ongoing commitment to your health goals. Please review the following plan we discussed and let me know if I can assist you in the future.   Screening recommendations/referrals: Colonoscopy: Completed Cologuard 05/15/2018- Due 05/15/2021 Mammogram: No longer indicated due to bilateral mastectomy Bone Density: Due- Please call the office when you are ready to schedule. Recommended yearly ophthalmology/optometry visit for glaucoma screening and checkup Recommended yearly dental visit for hygiene and checkup  Vaccinations: Influenza vaccine: Due- You may obtain vaccien at our office or your local pharmacy. Pneumococcal vaccine: Discuss need for  Pneumovax 23 with Dr. Anitra Lauth at your next office visit Tdap vaccine: Up to Date- Due-12/27/2022 Shingles vaccine: Discuss with pharmacy  Covid-19:Complted vaccines  Advanced directives: Declined information today.  Conditions/risks identified: See problem list  Next appointment: Follow up in one year for your annual wellness visit 02/01/21 @ 11:15   Preventive Care 65 Years and Older, Female Preventive care refers to lifestyle choices and visits with your health care provider that can promote health and wellness. What does preventive care include?  A yearly physical exam. This is also called an annual well check.  Dental exams once or twice a year.  Routine eye exams. Ask your health care provider how often you should have your eyes checked.  Personal lifestyle choices, including:  Daily care of your teeth and gums.  Regular physical activity.  Eating a healthy diet.  Avoiding tobacco and drug use.  Limiting alcohol use.  Practicing safe sex.  Taking low-dose aspirin every day.  Taking vitamin and mineral supplements as recommended by your health care provider. What happens during an annual well check? The services and screenings  done by your health care provider during your annual well check will depend on your age, overall health, lifestyle risk factors, and family history of disease. Counseling  Your health care provider may ask you questions about your:  Alcohol use.  Tobacco use.  Drug use.  Emotional well-being.  Home and relationship well-being.  Sexual activity.  Eating habits.  History of falls.  Memory and ability to understand (cognition).  Work and work Statistician.  Reproductive health. Screening  You may have the following tests or measurements:  Height, weight, and BMI.  Blood pressure.  Lipid and cholesterol levels. These may be checked every 5 years, or more frequently if you are over 62 years old.  Skin check.  Lung cancer screening. You may have this screening every year starting at age 42 if you have a 30-pack-year history of smoking and currently smoke or have quit within the past 15 years.  Fecal occult blood test (FOBT) of the stool. You may have this test every year starting at age 75.  Flexible sigmoidoscopy or colonoscopy. You may have a sigmoidoscopy every 5 years or a colonoscopy every 10 years starting at age 11.  Hepatitis C blood test.  Hepatitis B blood test.  Sexually transmitted disease (STD) testing.  Diabetes screening. This is done by checking your blood sugar (glucose) after you have not eaten for a while (fasting). You may have this done every 1-3 years.  Bone density scan. This is done to screen for osteoporosis. You may have this done starting at age 13.  Mammogram. This may be done every 1-2 years. Talk to your health care provider about how often you should have regular mammograms. Talk with your health care provider about your test results,  treatment options, and if necessary, the need for more tests. Vaccines  Your health care provider may recommend certain vaccines, such as:  Influenza vaccine. This is recommended every year.  Tetanus,  diphtheria, and acellular pertussis (Tdap, Td) vaccine. You may need a Td booster every 10 years.  Zoster vaccine. You may need this after age 45.  Pneumococcal 13-valent conjugate (PCV13) vaccine. One dose is recommended after age 53.  Pneumococcal polysaccharide (PPSV23) vaccine. One dose is recommended after age 80. Talk to your health care provider about which screenings and vaccines you need and how often you need them. This information is not intended to replace advice given to you by your health care provider. Make sure you discuss any questions you have with your health care provider. Document Released: 05/20/2015 Document Revised: 01/11/2016 Document Reviewed: 02/22/2015 Elsevier Interactive Patient Education  2017 [employer] Prevention in the Home Falls can cause injuries. They can happen to people of all ages. There are many things you can do to make your home safe and to help prevent falls. What can I do on the outside of my home?  Regularly fix the edges of walkways and driveways and fix any cracks.  Remove anything that might make you trip as you walk through a door, such as a raised step or threshold.  Trim any bushes or trees on the path to your home.  Use bright outdoor lighting.  Clear any walking paths of anything that might make someone trip, such as rocks or tools.  Regularly check to see if handrails are loose or broken. Make sure that both sides of any steps have handrails.  Any raised decks and porches should have guardrails on the edges.  Have any leaves, snow, or ice cleared regularly.  Use sand or salt on walking paths during winter.  Clean up any spills in your garage right away. This includes oil or grease spills. What can I do in the bathroom?  Use night lights.  Install grab bars by the toilet and in the tub and shower. Do not use towel bars as grab bars.  Use non-skid mats or decals in the tub or shower.  If you need to sit down in  the shower, use a plastic, non-slip stool.  Keep the floor dry. Clean up any water that spills on the floor as soon as it happens.  Remove soap buildup in the tub or shower regularly.  Attach bath mats securely with double-sided non-slip rug tape.  Do not have throw rugs and other things on the floor that can make you trip. What can I do in the bedroom?  Use night lights.  Make sure that you have a light by your bed that is easy to reach.  Do not use any sheets or blankets that are too big for your bed. They should not hang down onto the floor.  Have a firm chair that has side arms. You can use this for support while you get dressed.  Do not have throw rugs and other things on the floor that can make you trip. What can I do in the kitchen?  Clean up any spills right away.  Avoid walking on wet floors.  Keep items that you use a lot in easy-to-reach places.  If you need to reach something above you, use a strong step stool that has a grab bar.  Keep electrical cords out of the way.  Do not use floor polish or wax that makes floors  slippery. If you must use wax, use non-skid floor wax.  Do not have throw rugs and other things on the floor that can make you trip. What can I do with my stairs?  Do not leave any items on the stairs.  Make sure that there are handrails on both sides of the stairs and use them. Fix handrails that are broken or loose. Make sure that handrails are as long as the stairways.  Check any carpeting to make sure that it is firmly attached to the stairs. Fix any carpet that is loose or worn.  Avoid having throw rugs at the top or bottom of the stairs. If you do have throw rugs, attach them to the floor with carpet tape.  Make sure that you have a light switch at the top of the stairs and the bottom of the stairs. If you do not have them, ask someone to add them for you. What else can I do to help prevent falls?  Wear shoes that:  Do not have high  heels.  Have rubber bottoms.  Are comfortable and fit you well.  Are closed at the toe. Do not wear sandals.  If you use a stepladder:  Make sure that it is fully opened. Do not climb a closed stepladder.  Make sure that both sides of the stepladder are locked into place.  Ask someone to hold it for you, if possible.  Clearly mark and make sure that you can see:  Any grab bars or handrails.  First and last steps.  Where the edge of each step is.  Use tools that help you move around (mobility aids) if they are needed. These include:  Canes.  Walkers.  Scooters.  Crutches.  Turn on the lights when you go into a dark area. Replace any light bulbs as soon as they burn out.  Set up your furniture so you have a clear path. Avoid moving your furniture around.  If any of your floors are uneven, fix them.  If there are any pets around you, be aware of where they are.  Review your medicines with your doctor. Some medicines can make you feel dizzy. This can increase your chance of falling. Ask your doctor what other things that you can do to help prevent falls. This information is not intended to replace advice given to you by your health care provider. Make sure you discuss any questions you have with your health care provider. Document Released: 02/17/2009 Document Revised: 09/29/2015 Document Reviewed: 05/28/2014 Elsevier Interactive Patient Education  2017 [employer].

## 2020-01-28 ENCOUNTER — Encounter: Payer: Self-pay | Admitting: Family Medicine

## 2020-01-28 ENCOUNTER — Telehealth (INDEPENDENT_AMBULATORY_CARE_PROVIDER_SITE_OTHER): Payer: Medicare Other | Admitting: Family Medicine

## 2020-01-28 ENCOUNTER — Other Ambulatory Visit: Payer: Self-pay

## 2020-01-28 VITALS — Ht 63.75 in | Wt 222.0 lb

## 2020-01-28 DIAGNOSIS — G4733 Obstructive sleep apnea (adult) (pediatric): Secondary | ICD-10-CM | POA: Diagnosis not present

## 2020-01-28 DIAGNOSIS — Z9989 Dependence on other enabling machines and devices: Secondary | ICD-10-CM | POA: Diagnosis not present

## 2020-01-28 NOTE — Progress Notes (Addendum)
Virtual Visit via Video Note  I connected with pt on 01/28/20 at  1:00 PM EDT by a video enabled telemedicine application and verified that I am speaking with the correct person using two identifiers.  Location patient: home Location provider:work or home office Persons participating in the virtual visit: patient, provider  I discussed the limitations of evaluation and management by telemedicine and the availability of in person appointments. The patient expressed understanding and agreed to proceed.   HPI: 69 y/o WF being seen today for questions about OSA and CPAP. She got a new CPAP machine this year, medicare is requesting letter documenting that she is compliant with it and benefiting.  In review of her chart it appears she was dx'd with OSA by home sleep study with [place] [facility] pulm on 09/02/19 and CPAP was initiated after this. On 09/08/19 there is a note in the EMR from Dr. Ander Slade stating that pt needs to start cpap and f/u in 3 mo with him to review compliance with download.  She has not followed up as instructed.  ROS: See pertinent positives and negatives per HPI.  Past Medical History:  Diagnosis Date  . Anxiety and depression   . Atypical chest pain 2016   ruled out for MI, stress test showed no ischemia.  CT angio NO PE.  . Cellulitis of chest wall 12/09/2015   s/p breast surgery  . Colon cancer screening 05/11/2018   2007 colonoscopy normal.  Cologuard neg 05/11/18; repeat 3 yrs.  Marland Kitchen COPD (chronic obstructive pulmonary disease) (HCC)    mild; prn albut per Dr. Lamonte Sakai as of 2017--no f/u w/pulm since then.  . DDD (degenerative disc disease)   . Diabetes mellitus ([place])    w/DPN  . Diabetic peripheral neuropathy ([place])   . DJD (degenerative joint disease)   . Fatty liver disease, nonalcoholic   . Fibromyalgia    cymbalta  . GERD (gastroesophageal reflux disease)   . History of blood clots 2004   during cancer treatment  . History of breast cancer 2004   DCIS->lumpectomy +  rad. Eventually go bilat mastectomy in 2017 (Sister had breast cancer in her 32s). Last seen by onc not long after surgery, no further tx or onc f/u needed.  Marland Kitchen History of latent tuberculosis    got approp tx x 9 mo  . History of palpitations 2016   sinus tachycardia.  30 day event monitor was normal (Dr. Irish Lack)  . History of pneumonia   . HTN (hypertension) 01/28/2012  . Hyperlipidemia 01/28/2012  . Hypothyroidism 02/14/2014  . Lipoma 01/05/2018  . Mediastinal adenopathy 2016   on CT angio done for atypical CP.  Has FH of NHL in sister. Pulm saw her 01/2016 and recommended a f/u CT 09/2016 but this doesn't appear to have been done.  Rpt CT 02/2019->resolved.  . Nasal polyp 08/30/2012  . Obesity   . OSA on CPAP   . Osteoarthritis    L knee-"end stage"--Dr. Delfino Lovett  . Osteopenia   . Peripheral vascular disease ([place]) 04   axillary,upper chest after 3 weeks on tamoxifen  . Sialoadenitis of submandibular gland 08/27/2018   LEFT. doxy rx'd by ED. ENT f/u the next day->reassured, dx'd with partial left submandibular gland obstruction w/out sign of infxn; f/u prn.  . Tobacco abuse-unspec 10/12/2013    Past Surgical History:  Procedure Laterality Date  . ABDOMINAL HYSTERECTOMY  1991  . APPENDECTOMY  91  . BILATERAL OOPHORECTOMY  01/2003  . BREAST SURGERY Left 2004  lumpectomy (DCIS)+ radiation  . CARPAL TUNNEL RELEASE     right  . COLONOSCOPY  10/15/2005   Dr. Bronson Ing, poor prep  . INCISION AND DRAINAGE ABSCESS Left 12/13/2015   Procedure: DRAINAGE LEFT MASTECTOMY WOUND INFECTION;  Surgeon: Fanny Skates, MD;  Location: [facility];  Service: General;  Laterality: Left;  Marland Kitchen MASTECTOMY W/ SENTINEL NODE BIOPSY Bilateral 10/26/2015   Procedure: RIGHT TOTAL MASTECTOMY WITH RIGHT SENTINEL LYMPH NODE BIOPSY, INJECT BLUE DYE RIGHT BREAST, LEFT BREAST PROPHYLACTIC MASTECTOMY;  Surgeon: Fanny Skates, MD;  Location: [facility];  Service: General;  Laterality: Bilateral;  . NASAL SEPTUM SURGERY  08  .  PILONIDAL CYST EXCISION    . THYROIDECTOMY, PARTIAL  mid 80's   right side  . TOTAL KNEE ARTHROPLASTY Right   . TRANSTHORACIC ECHOCARDIOGRAM  05/25/2008   ? mild increase in LV filling pressure, EF 65%, normal LV function/wall motion, no LVH or ventricular enlargement.     Current Outpatient Medications:  .  albuterol (PROVENTIL HFA;VENTOLIN HFA) 108 (90 Base) MCG/ACT inhaler, INHALE 2 PUFFS INTO THE LUNGS EVERY 4 HOURS AS NEEDED FOR WHEEZING OR SHORTNESS OF BREATH OR COUGH, Disp: 48 Inhaler, Rfl: 4 .  albuterol (PROVENTIL) (2.5 MG/3ML) 0.083% nebulizer solution, Take 3 mLs (2.5 mg total) by nebulization every 6 (six) hours as needed for wheezing or shortness of breath., Disp: 150 mL, Rfl: 1 .  amitriptyline (ELAVIL) 25 MG tablet, 1-2 qhs, Disp: 180 tablet, Rfl: 3 .  cholecalciferol (VITAMIN D) 1000 UNITS tablet, Take 1,000 Units by mouth daily., Disp: , Rfl:  .  DULoxetine (CYMBALTA) 30 MG capsule, TAKE 3 CAPSULES BY MOUTH  DAILY, Disp: 270 capsule, Rfl: 3 .  DULoxetine (CYMBALTA) 60 MG capsule, 1 cap po qd taken with a duloxetine 30 mg cap, Disp: 90 capsule, Rfl: 3 .  esomeprazole (NEXIUM) 20 MG capsule, Take 1 capsule (20 mg total) by mouth daily at 12 noon., Disp: 90 capsule, Rfl: 3 .  Fluticasone-Salmeterol (ADVAIR DISKUS) 100-50 MCG/DOSE AEPB, TAKE 1 PUFF BY MOUTH TWICE A DAY, Disp: 180 each, Rfl: 1 .  hydrochlorothiazide (HYDRODIURIL) 25 MG tablet, TAKE 1 TABLET BY MOUTH  DAILY, Disp: 90 tablet, Rfl: 3 .  ibuprofen (ADVIL,MOTRIN) 200 MG tablet, Take 800 mg by mouth every 6 (six) hours as needed for headache or mild pain. , Disp: , Rfl:  .  ipratropium (ATROVENT) 0.03 % nasal spray, Place 2 sprays into the nose 4 (four) times daily., Disp: 30 mL, Rfl: 1 .  levothyroxine (SYNTHROID) 50 MCG tablet, TAKE 1 TABLET BY MOUTH  DAILY BEFORE BREAKFAST, Disp: 90 tablet, Rfl: 3 .  lisinopril (ZESTRIL) 10 MG tablet, Take 1 tablet (10 mg total) by mouth daily., Disp: 90 tablet, Rfl: 0 .  metFORMIN  (GLUCOPHAGE) 1000 MG tablet, TAKE 1 TABLET BY MOUTH  TWICE DAILY WITH MEALS, Disp: 180 tablet, Rfl: 3 .  metoprolol tartrate (LOPRESSOR) 100 MG tablet, TAKE 1 TABLET BY MOUTH  TWICE DAILY, Disp: 180 tablet, Rfl: 0 .  Multiple Vitamins-Minerals (MULTIVITAMIN WITH MINERALS) tablet, Take 1 tablet by mouth daily., Disp: , Rfl:  .  pravastatin (PRAVACHOL) 40 MG tablet, TAKE 1 TABLET BY MOUTH  DAILY, Disp: 90 tablet, Rfl: 1 .  tiZANidine (ZANAFLEX) 4 MG tablet, TAKE 1 TABLET BY MOUTH AT  BEDTIME (Patient not taking: Reported on 01/27/2020), Disp: 90 tablet, Rfl: 1  EXAM:  VITALS per patient if applicable:  Vitals with BMI 01/28/2020 01/27/2020 11/13/2019  Height 5' 3.75" 5\' 3"  5' 3.75"  Weight 222  lbs 222 lbs 227 lbs 8 oz  BMI 38.42 76.28 31.51  Systolic - - 761  Diastolic - - 72  Pulse - - 72     GENERAL: alert, oriented, appears well and in no acute distress  HEENT: atraumatic, conjunttiva clear, no obvious abnormalities on inspection of external nose and ears  NECK: normal movements of the head and neck  LUNGS: on inspection no signs of respiratory distress, breathing rate appears normal, no obvious gross SOB, gasping or wheezing  CV: no obvious cyanosis  MS: moves all visible extremities without noticeable abnormality  PSYCH/NEURO: pleasant and cooperative, no obvious depression or anxiety, speech and thought processing grossly intact  LABS: none today    Chemistry      Component Value Date/Time   NA 137 11/13/2019 0933   NA 141 04/09/2018 1545   NA 141 09/15/2015 1035   K 4.4 11/13/2019 0933   K 4.0 09/15/2015 1035   CL 98 11/13/2019 0933   CO2 31 11/13/2019 0933   CO2 30 (H) 09/15/2015 1035   BUN 12 11/13/2019 0933   BUN 12 04/09/2018 1545   BUN 15.5 09/15/2015 1035   CREATININE 0.85 11/13/2019 0933   CREATININE 0.91 01/25/2016 1011   CREATININE 0.9 09/15/2015 1035      Component Value Date/Time   CALCIUM 9.5 11/13/2019 0933   CALCIUM 9.8 09/15/2015 1035   ALKPHOS  62 06/16/2019 0904   ALKPHOS 60 09/15/2015 1035   AST 19 06/16/2019 0904   AST 35 (H) 09/15/2015 1035   ALT 33 06/16/2019 0904   ALT 53 09/15/2015 1035   BILITOT 0.4 06/16/2019 0904   BILITOT 0.3 04/09/2018 1545   BILITOT 0.31 09/15/2015 1035     Lab Results  Component Value Date   WBC 8.0 08/26/2018   HGB 14.3 08/26/2018   HCT 41.6 08/26/2018   MCV 85.8 08/26/2018   PLT 268 08/26/2018   Lab Results  Component Value Date   TSH 0.85 11/13/2019   Lab Results  Component Value Date   HGBA1C 6.3 11/13/2019    ASSESSMENT AND PLAN:  Discussed the following assessment and plan:  OSA, on CPAP: unfortunately, I had to notify pt that she needs to f/u with her pulmonologist (sleep MD) who did her sleep study, diagnosed her, and who recommended appropriate f/u in his office for compliance and download. Pt expressed understanding and was thankful for the explanation. She said she would contact Dr. Judson Roch office ASAP to follow through with this.   Spent 10 min with pt today reviewing her chart esp as pertains to past pulm/sleep med eval and recommendations, discussing her needs regarding letter, and reviewing/explaining/discussing plan.   F/u: prn  Signed:  Crissie Sickles, MD           01/28/2020

## 2020-01-29 DIAGNOSIS — G4733 Obstructive sleep apnea (adult) (pediatric): Secondary | ICD-10-CM | POA: Diagnosis not present

## 2020-02-04 ENCOUNTER — Other Ambulatory Visit: Payer: Self-pay | Admitting: Family Medicine

## 2020-02-11 DIAGNOSIS — G4733 Obstructive sleep apnea (adult) (pediatric): Secondary | ICD-10-CM | POA: Diagnosis not present

## 2020-02-15 ENCOUNTER — Ambulatory Visit: Payer: Medicare Other | Admitting: Family Medicine

## 2020-02-16 ENCOUNTER — Ambulatory Visit: Payer: Medicare Other | Admitting: Pulmonary Disease

## 2020-02-22 ENCOUNTER — Telehealth: Payer: Self-pay | Admitting: Family Medicine

## 2020-02-22 NOTE — Telephone Encounter (Signed)
Patient states that if she cannot transfer to Dr. Raoul Pitch, she wants very much to stay with Dr. Ernestine Conrad because she loves [facility]

## 2020-02-22 NOTE — Telephone Encounter (Signed)
Patient would like to transfer care from Dr. Anitra Lauth to Dr. Raoul Pitch. She states that she likes Dr. Anitra Lauth but she prefers a female provider. Please let me know if this is approved and I will call patient to schedule.

## 2020-02-22 NOTE — Telephone Encounter (Signed)
Please read below and advise if ok.

## 2020-02-22 NOTE — Telephone Encounter (Signed)
Please call patient back and advise her we do not permit switching between providers within the same office, since we are such a small office.

## 2020-02-23 ENCOUNTER — Ambulatory Visit: Payer: Medicare Other | Admitting: Family Medicine

## 2020-02-23 NOTE — Telephone Encounter (Signed)
I agree, cannot transfer between two physicians at the same office.

## 2020-02-23 NOTE — Telephone Encounter (Signed)
Pt is aware that she can not transfer care

## 2020-02-28 DIAGNOSIS — G4733 Obstructive sleep apnea (adult) (pediatric): Secondary | ICD-10-CM | POA: Diagnosis not present

## 2020-03-04 ENCOUNTER — Ambulatory Visit: Payer: Medicare Other | Admitting: Family Medicine

## 2020-03-07 ENCOUNTER — Ambulatory Visit: Payer: Medicare Other | Admitting: Pulmonary Disease

## 2020-03-07 ENCOUNTER — Encounter: Payer: Self-pay | Admitting: Pulmonary Disease

## 2020-03-07 ENCOUNTER — Other Ambulatory Visit: Payer: Self-pay

## 2020-03-07 VITALS — BP 126/66 | HR 71 | Temp 98.3°F | Ht 63.75 in | Wt 223.0 lb

## 2020-03-07 DIAGNOSIS — Z9989 Dependence on other enabling machines and devices: Secondary | ICD-10-CM

## 2020-03-07 DIAGNOSIS — G4733 Obstructive sleep apnea (adult) (pediatric): Secondary | ICD-10-CM | POA: Diagnosis not present

## 2020-03-07 NOTE — Addendum Note (Signed)
Addended by: Luanna Salk on: 03/07/2020 10:03 AM   Modules accepted: Orders

## 2020-03-07 NOTE — Progress Notes (Signed)
Susan [name]    [id number]    [birth year]  Primary Care Physician:McGowen, Adrian Blackwater, MD  Referring Physician: Tammi Sou, MD [address] [place],  [place] [address]  Chief complaint:   Patient with a history of obstructive sleep apnea  HPI:  Obstructive sleep apnea was diagnosed many years ago She currently is on CPAP Compliant with CPAP use on a regular basis  Having some issues with mask-needs a full facemask Chinstrap have not worked in the past    Usually goes to bed between 8 and 9 PM Takes few minutes to fall asleep about 5 to 10 minutes Wakes up maybe once or twice Final awakening time about 5 AM Sleep she feels is restorative enough Has gained about 15 pounds recently  Active smoker, trying to quit   no family history of obstructive sleep apnea  Medical problems include hypertension, diabetes, hypercholesterolemia  Outpatient Encounter Medications as of 03/07/2020  Medication Sig  . albuterol (PROVENTIL HFA;VENTOLIN HFA) 108 (90 Base) MCG/ACT inhaler INHALE 2 PUFFS INTO THE LUNGS EVERY 4 HOURS AS NEEDED FOR WHEEZING OR SHORTNESS OF BREATH OR COUGH  . albuterol (PROVENTIL) (2.5 MG/3ML) 0.083% nebulizer solution Take 3 mLs (2.5 mg total) by nebulization every 6 (six) hours as needed for wheezing or shortness of breath.  Marland Kitchen amitriptyline (ELAVIL) 25 MG tablet 1-2 qhs  . cholecalciferol (VITAMIN D) 1000 UNITS tablet Take 1,000 Units by mouth daily.  . DULoxetine (CYMBALTA) 30 MG capsule TAKE 3 CAPSULES BY MOUTH  DAILY  . DULoxetine (CYMBALTA) 60 MG capsule 1 cap po qd taken with a duloxetine 30 mg cap  . esomeprazole (NEXIUM) 20 MG capsule Take 1 capsule (20 mg total) by mouth daily at 12 noon.  . Fluticasone-Salmeterol (ADVAIR DISKUS) 100-50 MCG/DOSE AEPB TAKE 1 PUFF BY MOUTH TWICE A DAY  . hydrochlorothiazide (HYDRODIURIL) 25 MG tablet TAKE 1 TABLET BY MOUTH  DAILY  . ibuprofen (ADVIL,MOTRIN) 200 MG tablet Take 800 mg by mouth every 6  (six) hours as needed for headache or mild pain.   Marland Kitchen ipratropium (ATROVENT) 0.03 % nasal spray Place 2 sprays into the nose 4 (four) times daily.  Marland Kitchen levothyroxine (SYNTHROID) 50 MCG tablet TAKE 1 TABLET BY MOUTH  DAILY BEFORE BREAKFAST  . lisinopril (ZESTRIL) 10 MG tablet TAKE 1 TABLET BY MOUTH EVERY DAY  . metFORMIN (GLUCOPHAGE) 1000 MG tablet TAKE 1 TABLET BY MOUTH  TWICE DAILY WITH MEALS  . metoprolol tartrate (LOPRESSOR) 100 MG tablet TAKE 1 TABLET BY MOUTH  TWICE DAILY  . Multiple Vitamins-Minerals (MULTIVITAMIN WITH MINERALS) tablet Take 1 tablet by mouth daily.  . pravastatin (PRAVACHOL) 40 MG tablet TAKE 1 TABLET BY MOUTH  DAILY  . tiZANidine (ZANAFLEX) 4 MG tablet TAKE 1 TABLET BY MOUTH AT  BEDTIME   No facility-administered encounter medications on file as of 03/07/2020.    Allergies as of 03/07/2020 - Review Complete 03/07/2020  Allergen Reaction Noted  . Chantix [varenicline] Other (See Comments) 12/26/2011  . Ciprofloxacin Other (See Comments) 12/21/2011  . Latex Itching 06/16/2019  . Penicillins Anaphylaxis and Swelling 12/21/2011  . Tamoxifen Other (See Comments) 03/09/2013  . Levaquin [levofloxacin] Other (See Comments) 12/09/2015  . Lyrica [pregabalin] Swelling 12/21/2011  . Neurontin [gabapentin] Swelling 12/21/2011  . Morphine and related Other (See Comments) 10/17/2015  . Ceclor [cefaclor] Rash 12/26/2011  . Other Other (See Comments) 10/18/2015  . Sulfa antibiotics Rash 12/21/2011    Past Medical History:  Diagnosis Date  . Anxiety and depression   . Atypical chest pain 2016   ruled out for MI, stress test showed no ischemia.  CT angio NO PE.  . Cellulitis of chest wall 12/09/2015   s/p breast surgery  . Colon cancer screening 05/11/2018   2007 colonoscopy normal.  Cologuard neg 05/11/18; repeat 3 yrs.  Marland Kitchen COPD (chronic obstructive pulmonary disease) (HCC)    mild; prn albut per Dr. Lamonte Sakai as of 2017--no f/u w/pulm since then.  . DDD (degenerative disc disease)    . Diabetes mellitus ([place])    w/DPN  . Diabetic peripheral neuropathy ([place])   . DJD (degenerative joint disease)   . Fatty liver disease, nonalcoholic   . Fibromyalgia    cymbalta  . GERD (gastroesophageal reflux disease)   . History of blood clots 2004   during cancer treatment  . History of breast cancer 2004   DCIS->lumpectomy + rad. Eventually go bilat mastectomy in 2017 (Sister had breast cancer in her 74s). Last seen by onc not long after surgery, no further tx or onc f/u needed.  Marland Kitchen History of latent tuberculosis    got approp tx x 9 mo  . History of palpitations 2016   sinus tachycardia.  30 day event monitor was normal (Dr. Irish Lack)  . History of pneumonia   . HTN (hypertension) 01/28/2012  . Hyperlipidemia 01/28/2012  . Hypothyroidism 02/14/2014  . Lipoma 01/05/2018  . Mediastinal adenopathy 2016   on CT angio done for atypical CP.  Has FH of NHL in sister. Pulm saw her 01/2016 and recommended a f/u CT 09/2016 but this doesn't appear to have been done.  Rpt CT 02/2019->resolved.  . Nasal polyp 08/30/2012  . Obesity   . OSA on CPAP    HOme sleep study 09/02/19-->CPAP (Dr. Ander Slade w/[facility] pulm)  . Osteoarthritis    L knee-"end stage"--Dr. Delfino Lovett  . Osteopenia   . Peripheral vascular disease ([place]) 04   axillary,upper chest after 3 weeks on tamoxifen  . Sialoadenitis of submandibular gland 08/27/2018   LEFT. doxy rx'd by ED. ENT f/u the next day->reassured, dx'd with partial left submandibular gland obstruction w/out sign of infxn; f/u prn.  . Tobacco abuse-unspec 10/12/2013    Past Surgical History:  Procedure Laterality Date  . ABDOMINAL HYSTERECTOMY  1991  . APPENDECTOMY  91  . BILATERAL OOPHORECTOMY  01/2003  . BREAST SURGERY Left 2004   lumpectomy (DCIS)+ radiation  . CARPAL TUNNEL RELEASE     right  . COLONOSCOPY  10/15/2005   Dr. Bronson Ing, poor prep  . HOME SLEEP STUDY  09/02/2019   mod OSA->CPAP  . INCISION AND DRAINAGE ABSCESS Left 12/13/2015    Procedure: DRAINAGE LEFT MASTECTOMY WOUND INFECTION;  Surgeon: Fanny Skates, MD;  Location: [facility];  Service: General;  Laterality: Left;  Marland Kitchen MASTECTOMY W/ SENTINEL NODE BIOPSY Bilateral 10/26/2015   Procedure: RIGHT TOTAL MASTECTOMY WITH RIGHT SENTINEL LYMPH NODE BIOPSY, INJECT BLUE DYE RIGHT BREAST, LEFT BREAST PROPHYLACTIC MASTECTOMY;  Surgeon: Fanny Skates, MD;  Location: [facility];  Service: General;  Laterality: Bilateral;  . NASAL SEPTUM SURGERY  08  . PILONIDAL CYST EXCISION    . THYROIDECTOMY, PARTIAL  mid 80's   right side  . TOTAL KNEE ARTHROPLASTY Right   . TRANSTHORACIC ECHOCARDIOGRAM  05/25/2008   ? mild increase in LV filling pressure, EF 65%, normal LV function/wall motion, no LVH or ventricular enlargement.    Family History  Problem Relation Age of Onset  . Cancer Mother  66       liver cancer, hep c  . Heart disease Mother        chf  . Hypertension Mother   . Hyperlipidemia Mother   . Osteoporosis Mother   . Cancer Father 64       lung; smoker  . COPD Father   . Heart disease Sister        mvp  . Breast cancer Sister        dx. 87s  . Non-Hodgkin's lymphoma Sister 37       large B cell  . Hypertension Brother   . Arthritis Brother   . Kidney disease Brother   . Prostate cancer Brother        dx. early 58s  . Other Daughter        Beal's Syndrome  . Arthritis Daughter        Beal's  . Arthritis Son        Beal's connective tissue disease  . Vision loss Maternal Grandmother   . Dementia Maternal Grandmother   . Coronary artery disease Maternal Grandmother   . Heart Problems Maternal Grandmother   . Vision loss Paternal Grandfather   . Hypertension Brother   . Benign prostatic hyperplasia Brother   . Leukemia Brother        chronic lymphatic leukemia - small/B cell  . Hypertension Brother   . COPD Brother   . Prostate cancer Brother        dx. mid-60s  . Myelodysplastic syndrome Brother        dx. early 19s  . Hypertension Brother   .  Hyperlipidemia Brother   . Prostate cancer Brother        dx. mid-70s  . Melanoma Brother        (x2) melanomas dx. late 60s-early 33s  . Mental illness Brother        schizophrenia  . Breast cancer Brother        dx. 1s  . Cirrhosis Brother        hep c  . Hypertension Sister   . Other Sister        thalessemia, anemia; hx of hysterectomy in her 85s  . Hyperlipidemia Sister   . Gout Sister   . Diverticulitis Sister   . Other Daughter        overweight  . Breast cancer Maternal Aunt 21  . Lung cancer Cousin        maternal 1st cousin dx. 55 or younger; former smoker  . Colon cancer Cousin        maternal 1st cousin dx. late 43s-early 60s  . Cancer Cousin        maternal 1st cousin d. NOS cancer  . Emphysema Paternal Aunt   . Lung cancer Paternal Aunt        d. early 38s; smoker  . Leukemia Cousin        paternal 1st cousin d. early 60s  . Colon cancer Other 55       niece  . Melanoma Other        niece    Social History   Socioeconomic History  . Marital status: Married    Spouse name: Not on file  . Number of children: Not on file  . Years of education: Not on file  . Highest education level: Not on file  Occupational History  . Not on file  Tobacco Use  . Smoking status: Current Every Day Smoker  Packs/day: 1.00    Years: 35.00    Pack years: 35.00    Types: Cigarettes  . Smokeless tobacco: Never Used  Vaping Use  . Vaping Use: Never used  Substance and Sexual Activity  . Alcohol use: No    Alcohol/week: 0.0 standard drinks  . Drug use: No  . Sexual activity: Never  Other Topics Concern  . Not on file  Social History Narrative   Married, 3 children.  Two have Beal's syndrome (connective tissue d/o similar to Marfan's).   Orig from [place].  [place] since 36s.   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   Tobacco (current as of 08/2018).   No alcohol.   Social Determinants of Health    Financial Resource Strain: Low Risk   . Difficulty of Paying Living Expenses: Not hard at all  Food Insecurity: No Food Insecurity  . Worried About Charity fundraiser in the Last Year: Never true  . Ran Out of Food in the Last Year: Never true  Transportation Needs: No Transportation Needs  . Lack of Transportation (Medical): No  . Lack of Transportation (Non-Medical): No  Physical Activity: Inactive  . Days of Exercise per Week: 0 days  . Minutes of Exercise per Session: 0 min  Stress: No Stress Concern Present  . Feeling of Stress : Not at all  Social Connections: Socially Integrated  . Frequency of Communication with Friends and Family: More than three times a week  . Frequency of Social Gatherings with Friends and Family: Once a week  . Attends Religious Services: More than 4 times per year  . Active Member of Clubs or Organizations: Yes  . Attends Archivist Meetings: More than 4 times per year  . Marital Status: Married  Human resources officer Violence: Not At Risk  . Fear of Current or Ex-Partner: No  . Emotionally Abused: No  . Physically Abused: No  . Sexually Abused: No    Review of Systems  Constitutional: Negative.   Respiratory: Positive for apnea.   Psychiatric/Behavioral: Positive for sleep disturbance.    Vitals:   03/07/20 0918  BP: 126/66  Pulse: 71  Temp: 98.3 F (36.8 C)  SpO2: 100%     Physical Exam Constitutional:      Appearance: She is well-developed. She is obese.  HENT:     Head: Normocephalic.  Eyes:     General:        Right eye: No discharge.        Left eye: No discharge.     Pupils: Pupils are equal, round, and reactive to light.  Neck:     Thyroid: No thyromegaly.     Trachea: No tracheal deviation.  Cardiovascular:     Rate and Rhythm: Normal rate and regular rhythm.  Pulmonary:     Effort: Pulmonary effort is normal. No respiratory distress.     Breath sounds: No stridor. No wheezing, rhonchi or rales.   Musculoskeletal:     Cervical back: Normal range of motion and neck supple.  Neurological:     Mental Status: She is alert and oriented to person, place, and time.     Results of the Epworth flowsheet 08/27/2019  Sitting and reading 1  Watching TV 1  Sitting, inactive in a public place (e.g. a theatre or a meeting) 0  As a passenger in a car for an hour without a break 2  Lying down  to rest in the afternoon when circumstances permit 1  Sitting and talking to someone 0  Sitting quietly after a lunch without alcohol 1  In a car, while stopped for a few minutes in traffic 0  Total score 6   Assessment:  History of obstructive sleep apnea -Compliant with CPAP use -She still does have some snoring -Compliance data not available at present-she claims good compliance with daily use of CPAP  Obesity   Active smoker -About 10 sticks a day -Working on quitting  Plan/Recommendations: Continue CPAP on a regular basis  DME referral for CPAP supplies-switch to full facemask  Follow-up in 6 months  Encouraged to continue working on weight loss efforts  Smoking cessation counseling -She will set a date for her birthday to quit  Encouraged to call with any significant concerns  Sherrilyn Rist MD  Pulmonary and Critical Care 03/07/2020, 9:31 AM  CC: McGowen, Adrian Blackwater, MD

## 2020-03-07 NOTE — Patient Instructions (Signed)
Obstructive sleep apnea -DME referral for full facemask/CPAP supplies  Allergies -Continue with Zyrtec -You may try Xyzal -Nasal saline or nasal gel will help with congestion  Follow-up in 6 months  Continue with smoking cessation efforts

## 2020-03-08 ENCOUNTER — Other Ambulatory Visit: Payer: Self-pay | Admitting: Family Medicine

## 2020-03-15 DIAGNOSIS — M1712 Unilateral primary osteoarthritis, left knee: Secondary | ICD-10-CM | POA: Diagnosis not present

## 2020-03-16 ENCOUNTER — Other Ambulatory Visit: Payer: Self-pay

## 2020-03-16 ENCOUNTER — Encounter: Payer: Self-pay | Admitting: Family Medicine

## 2020-03-16 ENCOUNTER — Ambulatory Visit (INDEPENDENT_AMBULATORY_CARE_PROVIDER_SITE_OTHER): Payer: Medicare Other | Admitting: Family Medicine

## 2020-03-16 ENCOUNTER — Telehealth: Payer: Self-pay

## 2020-03-16 VITALS — BP 123/71 | HR 61 | Temp 98.0°F | Resp 16 | Ht 63.75 in | Wt 223.6 lb

## 2020-03-16 DIAGNOSIS — E118 Type 2 diabetes mellitus with unspecified complications: Secondary | ICD-10-CM | POA: Diagnosis not present

## 2020-03-16 DIAGNOSIS — Z23 Encounter for immunization: Secondary | ICD-10-CM | POA: Diagnosis not present

## 2020-03-16 DIAGNOSIS — I10 Essential (primary) hypertension: Secondary | ICD-10-CM

## 2020-03-16 LAB — BASIC METABOLIC PANEL
BUN: 18 mg/dL (ref 6–23)
CO2: 30 mEq/L (ref 19–32)
Calcium: 9.4 mg/dL (ref 8.4–10.5)
Chloride: 98 mEq/L (ref 96–112)
Creatinine, Ser: 0.84 mg/dL (ref 0.40–1.20)
GFR: 71.16 mL/min (ref >60.00)
Glucose, Bld: 87 mg/dL (ref 70–99)
Potassium: 3.7 mEq/L (ref 3.5–5.1)
Sodium: 137 mEq/L (ref 135–145)

## 2020-03-16 LAB — HEMOGLOBIN A1C: Hgb A1c MFr Bld: 6.6 % — ABNORMAL HIGH (ref 4.6–6.5)

## 2020-03-16 MED ORDER — CYCLOBENZAPRINE HCL 10 MG PO TABS: 10.0000 mg | ORAL_TABLET | Freq: Three times a day (TID) | ORAL | 1 refills | Status: DC | PRN

## 2020-03-16 NOTE — Progress Notes (Signed)
OFFICE VISIT  03/16/2020  CC:  Chief Complaint  Patient presents with  . Follow-up    RCI, pt is not fasting   HPI:    Patient is a 69 y.o. Caucasian female who presents for chronic illness f/u: fibromyalgia, DM, HTN, tob depend, hypoth. A/P as of telemed visit 4 mo ago: 1) Diffuse soft tissue pain: fibromyalgia.  Cymbalta at 90mg  a day dosing. 2) Bilat feet paresthesias, chronic, waxing and waning intensity. 3) Bilat hands pain, w/out symptoms or signs of inflammatory arthritis.  Reassured--suspect all current sx's are a part of her fibromyalgia syndrome + some overuse of hands --knitting a lot + DPN. I don't think there is any need to go back to the neurologist at this time, esp since she was unable to tolerate NCS/EMG in the past. No new meds today. She is going to focus on quitting smoking. TSH, A1c, BMET today."  INTERIM HX: Cutting back on cigs some, has a "cold [place]" quit date-->04/18/20.  Wearing 21mg  nicotine patch x 1 wk now. Saw pulm for OSA f/u, got full face mask.  HTN: home bp's 120s/60s.  Compliant with toprol, hctz, lisin. DM; no home gluc monitoring.  Compliant with metformin, trying to eat healthy. No exercise, wants to start water aerobics. Hypoth: Takes T4 on empty stomach w/out any other meds.  Has some "searing, burning, tingling" in L lateral/post neck and upper trap. Says flexeril hs has helped in the past for this and asks for rx for this.  Has not had any lately.  Hx of neuro eval showing DDD/spondylosis, ? Nerve compression in the past. L knee pain/osteoarth, got steroid inj yesterday at ortho, first one in 4 mo. Was rx'd voltaren gel by ortho.  ROS: no fevers, no CP, no SOB, no wheezing, no cough, no dizziness, no HAs, no rashes, no melena/hematochezia.  No polyuria or polydipsia.  No focal weakness, paresthesias, or tremors.  No acute vision or hearing abnormalities. No n/v/d or abd pain.  No palpitations.     Past Medical History:  Diagnosis  Date  . Anxiety and depression   . Atypical chest pain 2016   ruled out for MI, stress test showed no ischemia.  CT angio NO PE.  . Cellulitis of chest wall 12/09/2015   s/p breast surgery  . Colon cancer screening 05/11/2018   2007 colonoscopy normal.  Cologuard neg 05/11/18; repeat 3 yrs.  Marland Kitchen COPD (chronic obstructive pulmonary disease) (HCC)    mild; prn albut per Dr. Lamonte Sakai as of 2017--no f/u w/pulm since then.  . DDD (degenerative disc disease)   . Diabetes mellitus ([place])    w/DPN  . Diabetic peripheral neuropathy ([place])   . DJD (degenerative joint disease)   . Fatty liver disease, nonalcoholic   . Fibromyalgia    cymbalta  . GERD (gastroesophageal reflux disease)   . History of blood clots 2004   during cancer treatment  . History of breast cancer 2004   DCIS->lumpectomy + rad. Eventually go bilat mastectomy in 2017 (Sister had breast cancer in her 14s). Last seen by onc not long after surgery, no further tx or onc f/u needed.  Marland Kitchen History of latent tuberculosis    got approp tx x 9 mo  . History of palpitations 2016   sinus tachycardia.  30 day event monitor was normal (Dr. Irish Lack)  . History of pneumonia   . HTN (hypertension) 01/28/2012  . Hyperlipidemia 01/28/2012  . Hypothyroidism 02/14/2014  . Lipoma 01/05/2018  . Mediastinal adenopathy  2016   on CT angio done for atypical CP.  Has FH of NHL in sister. Pulm saw her 01/2016 and recommended a f/u CT 09/2016 but this doesn't appear to have been done.  Rpt CT 02/2019->resolved.  . Nasal polyp 08/30/2012  . Obesity   . OSA on CPAP    HOme sleep study 09/02/19-->CPAP (Dr. Ander Slade w/[facility] pulm)  . Osteoarthritis    L knee-"end stage"--Dr. Delfino Lovett  . Osteopenia   . Peripheral vascular disease ([place]) 04   axillary,upper chest after 3 weeks on tamoxifen  . Sialoadenitis of submandibular gland 08/27/2018   LEFT. doxy rx'd by ED. ENT f/u the next day->reassured, dx'd with partial left submandibular gland obstruction w/out sign of  infxn; f/u prn.  . Tobacco abuse-unspec 10/12/2013    Past Surgical History:  Procedure Laterality Date  . ABDOMINAL HYSTERECTOMY  1991  . APPENDECTOMY  91  . BILATERAL OOPHORECTOMY  01/2003  . BREAST SURGERY Left 2004   lumpectomy (DCIS)+ radiation  . CARPAL TUNNEL RELEASE     right  . COLONOSCOPY  10/15/2005   Dr. Bronson Ing, poor prep  . HOME SLEEP STUDY  09/02/2019   mod OSA->CPAP  . INCISION AND DRAINAGE ABSCESS Left 12/13/2015   Procedure: DRAINAGE LEFT MASTECTOMY WOUND INFECTION;  Surgeon: Fanny Skates, MD;  Location: [facility];  Service: General;  Laterality: Left;  Marland Kitchen MASTECTOMY W/ SENTINEL NODE BIOPSY Bilateral 10/26/2015   Procedure: RIGHT TOTAL MASTECTOMY WITH RIGHT SENTINEL LYMPH NODE BIOPSY, INJECT BLUE DYE RIGHT BREAST, LEFT BREAST PROPHYLACTIC MASTECTOMY;  Surgeon: Fanny Skates, MD;  Location: [facility];  Service: General;  Laterality: Bilateral;  . NASAL SEPTUM SURGERY  08  . PILONIDAL CYST EXCISION    . THYROIDECTOMY, PARTIAL  mid 80's   right side  . TOTAL KNEE ARTHROPLASTY Right   . TRANSTHORACIC ECHOCARDIOGRAM  05/25/2008   ? mild increase in LV filling pressure, EF 65%, normal LV function/wall motion, no LVH or ventricular enlargement.    Outpatient Medications Prior to Visit  Medication Sig Dispense Refill  . albuterol (PROVENTIL HFA;VENTOLIN HFA) 108 (90 Base) MCG/ACT inhaler INHALE 2 PUFFS INTO THE LUNGS EVERY 4 HOURS AS NEEDED FOR WHEEZING OR SHORTNESS OF BREATH OR COUGH 48 Inhaler 4  . amitriptyline (ELAVIL) 25 MG tablet 1-2 qhs 180 tablet 3  . cholecalciferol (VITAMIN D) 1000 UNITS tablet Take 1,000 Units by mouth daily.    . diclofenac Sodium (VOLTAREN) 1 % GEL Voltaren 1 % topical gel  APPLY 2 GRAM TO THE AFFECTED AREA(S) BY TOPICAL ROUTE 2 or 3 TIMES PER DAY    . DULoxetine (CYMBALTA) 30 MG capsule TAKE 3 CAPSULES BY MOUTH  DAILY 270 capsule 3  . esomeprazole (NEXIUM) 20 MG capsule Take 1 capsule (20 mg total) by mouth daily at 12 noon. 90 capsule 3  .  hydrochlorothiazide (HYDRODIURIL) 25 MG tablet TAKE 1 TABLET BY MOUTH  DAILY 90 tablet 3  . ibuprofen (ADVIL,MOTRIN) 200 MG tablet Take 800 mg by mouth every 6 (six) hours as needed for headache or mild pain.     Marland Kitchen levothyroxine (SYNTHROID) 50 MCG tablet TAKE 1 TABLET BY MOUTH  DAILY BEFORE BREAKFAST 90 tablet 3  . lisinopril (ZESTRIL) 10 MG tablet TAKE 1 TABLET BY MOUTH  DAILY 90 tablet 1  . metFORMIN (GLUCOPHAGE) 1000 MG tablet TAKE 1 TABLET BY MOUTH  TWICE DAILY WITH MEALS 180 tablet 3  . metoprolol tartrate (LOPRESSOR) 100 MG tablet TAKE 1 TABLET BY MOUTH  TWICE DAILY 180 tablet 1  .  Multiple Vitamins-Minerals (MULTIVITAMIN WITH MINERALS) tablet Take 1 tablet by mouth daily.    . pravastatin (PRAVACHOL) 40 MG tablet TAKE 1 TABLET BY MOUTH  DAILY 90 tablet 1  . albuterol (PROVENTIL) (2.5 MG/3ML) 0.083% nebulizer solution Take 3 mLs (2.5 mg total) by nebulization every 6 (six) hours as needed for wheezing or shortness of breath. (Patient not taking: Reported on 03/16/2020) 150 mL 1  . Fluticasone-Salmeterol (ADVAIR DISKUS) 100-50 MCG/DOSE AEPB TAKE 1 PUFF BY MOUTH TWICE A DAY (Patient not taking: Reported on 03/16/2020) 180 each 1  . ipratropium (ATROVENT) 0.03 % nasal spray Place 2 sprays into the nose 4 (four) times daily. (Patient not taking: Reported on 03/16/2020) 30 mL 1  . tiZANidine (ZANAFLEX) 4 MG tablet TAKE 1 TABLET BY MOUTH AT  BEDTIME (Patient not taking: Reported on 03/16/2020) 90 tablet 1  . DULoxetine (CYMBALTA) 60 MG capsule 1 cap po qd taken with a duloxetine 30 mg cap (Patient not taking: Reported on 03/16/2020) 90 capsule 3   No facility-administered medications prior to visit.    Allergies  Allergen Reactions  . Chantix [Varenicline] Other (See Comments)    "Felt like having mental breakdown"  . Ciprofloxacin Other (See Comments)    Muscle tightness, tendon aching and pain  . Latex Itching  . Penicillins Anaphylaxis and Swelling  . Tamoxifen Other (See Comments)     Blood clots  . Levaquin [Levofloxacin] Other (See Comments)    Severe tendon pain  . Lyrica [Pregabalin] Swelling  . Neurontin [Gabapentin] Swelling  . Morphine And Related Other (See Comments)    MORPHINE DRIP - causes pt to feel irritable and itch  . Ceclor [Cefaclor] Rash  . Other Other (See Comments)    Redness from tape and bandaides  . Sulfa Antibiotics Rash    ROS As per HPI  PE: Vitals with BMI 03/16/2020 03/07/2020 01/28/2020  Height 5' 3.75" 5' 3.75" 5' 3.75"  Weight 223 lbs 10 oz 223 lbs 222 lbs  BMI 38.69 32.20 25.42  Systolic 706 237 -  Diastolic 71 66 -  Pulse 61 71 -   Gen: Alert, well appearing.  Patient is oriented to person, place, time, and situation. AFFECT: pleasant, lucid thought and speech. CV: RRR, no m/r/g.   LUNGS: CTA bilat, nonlabored resps, good aeration in all lung fields. EXT: no clubbing or cyanosis.  no edema.    LABS:  Lab Results  Component Value Date   TSH 0.85 11/13/2019   Lab Results  Component Value Date   WBC 8.0 08/26/2018   HGB 14.3 08/26/2018   HCT 41.6 08/26/2018   MCV 85.8 08/26/2018   PLT 268 08/26/2018   Lab Results  Component Value Date   CREATININE 0.85 11/13/2019   BUN 12 11/13/2019   NA 137 11/13/2019   K 4.4 11/13/2019   CL 98 11/13/2019   CO2 31 11/13/2019   Lab Results  Component Value Date   ALT 33 06/16/2019   AST 19 06/16/2019   ALKPHOS 62 06/16/2019   BILITOT 0.4 06/16/2019   Lab Results  Component Value Date   CHOL 125 06/16/2019   Lab Results  Component Value Date   HDL 42.60 06/16/2019   Lab Results  Component Value Date   LDLCALC 55 06/16/2019   Lab Results  Component Value Date   TRIG 137.0 06/16/2019   Lab Results  Component Value Date   CHOLHDL 3 06/16/2019   Lab Results  Component Value Date   HGBA1C 6.3 11/13/2019  IMPRESSION AND PLAN:  1) Fibromyalgia: doing pretty well. Has some myalgia-type pain L neck/upper back/trap area lately. Flexeril 10mg  tid prn, #60, RF  x 1---this has helped for this pain in the past. Continue cymbalta 30mg , 3 tabs qd as well as amitriptyline 25-50mg  qhs prn.  2) Tob dependence: working on cessation currently, nicotine patches at this time, plan is to be completely quit by about a month from now.  3) DM 2: compliant with med. Diet fair. No exercise. A1c and non-fasting glucose check today.  4) HTN: good control.  Cont toprol xl 100mg  bid, hctz 25mg  qd, and lisinopril 10mg  qd. BMET today.  5) HLD: tolerating statin. Good lipids, normal hepatic panel 06/2019. Plan repeat hepatic panel and FLP in 3 mo.  6) Hypoth: Hypoth: Takes T4 on empty stomach w/out any other meds. TSH normal 11/2019. Plan TSH monitoring at f/u in 3 mo.  7) Preventative health:  Flu vacc UTD. Has had covid + booster.   She'll get shingrix at her pharmacy. Pneumovax 23 booster today.   An After Visit Summary was printed and given to the patient.  FOLLOW UP: No follow-ups on file.  Signed:  Crissie Sickles, MD           03/16/2020

## 2020-03-16 NOTE — Telephone Encounter (Signed)
Patient received pneumonia vaccine today.  Spot where injection is red, very swollen, warm to touch and has red line going thru it.    She is coming by because it is getting bigger just in the last hour.

## 2020-03-16 NOTE — Addendum Note (Signed)
Addended by: Deveron Furlong D on: 03/16/2020 10:11 AM   Modules accepted: Orders

## 2020-03-16 NOTE — Telephone Encounter (Signed)
Pt was seen by PCP

## 2020-03-18 ENCOUNTER — Encounter: Payer: Self-pay | Admitting: Family Medicine

## 2020-03-30 DIAGNOSIS — G4733 Obstructive sleep apnea (adult) (pediatric): Secondary | ICD-10-CM | POA: Diagnosis not present

## 2020-04-09 ENCOUNTER — Other Ambulatory Visit: Payer: Self-pay | Admitting: Family Medicine

## 2020-04-09 DIAGNOSIS — E039 Hypothyroidism, unspecified: Secondary | ICD-10-CM

## 2020-04-15 DIAGNOSIS — G4733 Obstructive sleep apnea (adult) (pediatric): Secondary | ICD-10-CM | POA: Diagnosis not present

## 2020-04-21 ENCOUNTER — Other Ambulatory Visit: Payer: Self-pay | Admitting: Family Medicine

## 2020-04-29 DIAGNOSIS — G4733 Obstructive sleep apnea (adult) (pediatric): Secondary | ICD-10-CM | POA: Diagnosis not present

## 2020-05-25 DIAGNOSIS — Z20822 Contact with and (suspected) exposure to covid-19: Secondary | ICD-10-CM | POA: Diagnosis not present

## 2020-05-30 DIAGNOSIS — G4733 Obstructive sleep apnea (adult) (pediatric): Secondary | ICD-10-CM | POA: Diagnosis not present

## 2020-06-02 ENCOUNTER — Other Ambulatory Visit: Payer: Self-pay | Admitting: Family Medicine

## 2020-06-16 ENCOUNTER — Other Ambulatory Visit: Payer: Self-pay

## 2020-06-17 ENCOUNTER — Encounter: Payer: Self-pay | Admitting: Family Medicine

## 2020-06-17 ENCOUNTER — Ambulatory Visit (INDEPENDENT_AMBULATORY_CARE_PROVIDER_SITE_OTHER): Payer: Medicare Other | Admitting: Family Medicine

## 2020-06-17 VITALS — BP 112/70 | HR 76 | Temp 97.7°F | Resp 16 | Ht 63.5 in | Wt 221.8 lb

## 2020-06-17 DIAGNOSIS — M797 Fibromyalgia: Secondary | ICD-10-CM

## 2020-06-17 DIAGNOSIS — E7800 Pure hypercholesterolemia, unspecified: Secondary | ICD-10-CM

## 2020-06-17 DIAGNOSIS — I10 Essential (primary) hypertension: Secondary | ICD-10-CM

## 2020-06-17 DIAGNOSIS — F17200 Nicotine dependence, unspecified, uncomplicated: Secondary | ICD-10-CM

## 2020-06-17 DIAGNOSIS — Z0000 Encounter for general adult medical examination without abnormal findings: Secondary | ICD-10-CM | POA: Diagnosis not present

## 2020-06-17 DIAGNOSIS — E119 Type 2 diabetes mellitus without complications: Secondary | ICD-10-CM | POA: Diagnosis not present

## 2020-06-17 DIAGNOSIS — E039 Hypothyroidism, unspecified: Secondary | ICD-10-CM

## 2020-06-17 DIAGNOSIS — E2839 Other primary ovarian failure: Secondary | ICD-10-CM

## 2020-06-17 LAB — LIPID PANEL
Cholesterol: 104 mg/dL (ref 0–200)
HDL: 36.9 mg/dL — ABNORMAL LOW (ref >39.00)
LDL Cholesterol: 46 mg/dL (ref 0–99)
NonHDL: 67.54
Total CHOL/HDL Ratio: 3
Triglycerides: 109 mg/dL (ref 0.0–149.0)
VLDL: 21.8 mg/dL (ref 0.0–40.0)

## 2020-06-17 LAB — COMPREHENSIVE METABOLIC PANEL
ALT: 25 U/L (ref 0–35)
AST: 15 U/L (ref 0–37)
Albumin: 4.4 g/dL (ref 3.5–5.2)
Alkaline Phosphatase: 60 U/L (ref 39–117)
BUN: 19 mg/dL (ref 6–23)
CO2: 31 mEq/L (ref 19–32)
Calcium: 9.2 mg/dL (ref 8.4–10.5)
Chloride: 95 mEq/L — ABNORMAL LOW (ref 96–112)
Creatinine, Ser: 0.94 mg/dL (ref 0.40–1.20)
GFR: 62.06 mL/min (ref >60.00)
Glucose, Bld: 84 mg/dL (ref 70–99)
Potassium: 3.6 mEq/L (ref 3.5–5.1)
Sodium: 137 mEq/L (ref 135–145)
Total Bilirubin: 0.5 mg/dL (ref 0.2–1.2)
Total Protein: 6.8 g/dL (ref 6.0–8.3)

## 2020-06-17 LAB — CBC WITH DIFFERENTIAL/PLATELET
Basophils Absolute: 0 10*3/uL (ref 0.0–0.1)
Basophils Relative: 0.5 % (ref 0.0–3.0)
Eosinophils Absolute: 0.3 10*3/uL (ref 0.0–0.7)
Eosinophils Relative: 4.5 % (ref 0.0–5.0)
HCT: 36.2 % (ref 36.0–46.0)
Hemoglobin: 12.4 g/dL (ref 12.0–15.0)
Lymphocytes Relative: 53 % — ABNORMAL HIGH (ref 12.0–46.0)
Lymphs Abs: 3.2 10*3/uL (ref 0.7–4.0)
MCHC: 34.1 g/dL (ref 30.0–36.0)
MCV: 84.9 fl (ref 78.0–100.0)
Monocytes Absolute: 0.5 10*3/uL (ref 0.1–1.0)
Monocytes Relative: 8.5 % (ref 3.0–12.0)
Neutro Abs: 2 10*3/uL (ref 1.4–7.7)
Neutrophils Relative %: 33.5 % — ABNORMAL LOW (ref 43.0–77.0)
Platelets: 247 10*3/uL (ref 150.0–400.0)
RBC: 4.27 Mil/uL (ref 3.87–5.11)
RDW: 13.3 % (ref 11.5–15.5)
WBC: 6 10*3/uL (ref 4.0–10.5)

## 2020-06-17 LAB — TSH: TSH: 1.2 u[IU]/mL (ref 0.35–4.50)

## 2020-06-17 LAB — MICROALBUMIN / CREATININE URINE RATIO
Creatinine,U: 58 mg/dL
Microalb Creat Ratio: 1.2 mg/g (ref 0.0–30.0)
Microalb, Ur: <0.7 mg/dL (ref 0.0–1.9)

## 2020-06-17 MED ORDER — LISINOPRIL 10 MG PO TABS: 10.0000 mg | ORAL_TABLET | Freq: Every day | ORAL | 3 refills | Status: DC

## 2020-06-17 NOTE — Patient Instructions (Signed)
Health Maintenance, Female Adopting a healthy lifestyle and getting preventive care are important in promoting health and wellness. Ask your health care provider about:  The right schedule for you to have regular tests and exams.  Things you can do on your own to prevent diseases and keep yourself healthy. What should I know about diet, weight, and exercise? Eat a healthy diet  Eat a diet that includes plenty of vegetables, fruits, low-fat dairy products, and lean protein.  Do not eat a lot of foods that are high in solid fats, added sugars, or sodium.   Maintain a healthy weight Body mass index (BMI) is used to identify weight problems. It estimates body fat based on height and weight. Your health care provider can help determine your BMI and help you achieve or maintain a healthy weight. Get regular exercise Get regular exercise. This is one of the most important things you can do for your health. Most adults should:  Exercise for at least 150 minutes each week. The exercise should increase your heart rate and make you sweat (moderate-intensity exercise).  Do strengthening exercises at least twice a week. This is in addition to the moderate-intensity exercise.  Spend less time sitting. Even light physical activity can be beneficial. Watch cholesterol and blood lipids Have your blood tested for lipids and cholesterol at 70 years of age, then have this test every 5 years. Have your cholesterol levels checked more often if:  Your lipid or cholesterol levels are high.  You are older than 70 years of age.  You are at high risk for heart disease. What should I know about cancer screening? Depending on your health history and family history, you may need to have cancer screening at various ages. This may include screening for:  Breast cancer.  Cervical cancer.  Colorectal cancer.  Skin cancer.  Lung cancer. What should I know about heart disease, diabetes, and high blood  pressure? Blood pressure and heart disease  High blood pressure causes heart disease and increases the risk of stroke. This is more likely to develop in people who have high blood pressure readings, are of African descent, or are overweight.  Have your blood pressure checked: ? Every 3-5 years if you are 18-39 years of age. ? Every year if you are 40 years old or older. Diabetes Have regular diabetes screenings. This checks your fasting blood sugar level. Have the screening done:  Once every three years after age 40 if you are at a normal weight and have a low risk for diabetes.  More often and at a younger age if you are overweight or have a high risk for diabetes. What should I know about preventing infection? Hepatitis B If you have a higher risk for hepatitis B, you should be screened for this virus. Talk with your health care provider to find out if you are at risk for hepatitis B infection. Hepatitis C Testing is recommended for:  Everyone born from 1945 through 1965.  Anyone with known risk factors for hepatitis C. Sexually transmitted infections (STIs)  Get screened for STIs, including gonorrhea and chlamydia, if: ? You are sexually active and are younger than 70 years of age. ? You are older than 70 years of age and your health care provider tells you that you are at risk for this type of infection. ? Your sexual activity has changed since you were last screened, and you are at increased risk for chlamydia or gonorrhea. Ask your health care provider   if you are at risk.  Ask your health care provider about whether you are at high risk for HIV. Your health care provider may recommend a prescription medicine to help prevent HIV infection. If you choose to take medicine to prevent HIV, you should first get tested for HIV. You should then be tested every 3 months for as long as you are taking the medicine. Pregnancy  If you are about to stop having your period (premenopausal) and  you may become pregnant, seek counseling before you get pregnant.  Take 400 to 800 micrograms (mcg) of folic acid every day if you become pregnant.  Ask for birth control (contraception) if you want to prevent pregnancy. Osteoporosis and menopause Osteoporosis is a disease in which the bones lose minerals and strength with aging. This can result in bone fractures. If you are 65 years old or older, or if you are at risk for osteoporosis and fractures, ask your health care provider if you should:  Be screened for bone loss.  Take a calcium or vitamin D supplement to lower your risk of fractures.  Be given hormone replacement therapy (HRT) to treat symptoms of menopause. Follow these instructions at home: Lifestyle  Do not use any products that contain nicotine or tobacco, such as cigarettes, e-cigarettes, and chewing tobacco. If you need help quitting, ask your health care provider.  Do not use street drugs.  Do not share needles.  Ask your health care provider for help if you need support or information about quitting drugs. Alcohol use  Do not drink alcohol if: ? Your health care provider tells you not to drink. ? You are pregnant, may be pregnant, or are planning to become pregnant.  If you drink alcohol: ? Limit how much you use to 0-1 drink a day. ? Limit intake if you are breastfeeding.  Be aware of how much alcohol is in your drink. In the [place] one drink equals one 12 oz bottle of beer (355 mL), one 5 oz glass of wine (148 mL), or one 1 oz glass of hard liquor (44 mL). General instructions  Schedule regular health, dental, and eye exams.  Stay current with your vaccines.  Tell your health care provider if: ? You often feel depressed. ? You have ever been abused or do not feel safe at home. Summary  Adopting a healthy lifestyle and getting preventive care are important in promoting health and wellness.  Follow your health care provider's instructions about healthy  diet, exercising, and getting tested or screened for diseases.  Follow your health care provider's instructions on monitoring your cholesterol and blood pressure. This information is not intended to replace advice given to you by your health care provider. Make sure you discuss any questions you have with your health care provider. Document Revised: 04/16/2018 Document Reviewed: 04/16/2018 Elsevier Patient Education  2021 Elsevier Inc.  

## 2020-06-17 NOTE — Progress Notes (Signed)
Office Note 06/17/2020  CC:  Chief Complaint  Patient presents with  . Annual Exam    Pt is fasting. UTD on vaccines, flu shot and covid booster completed on 02/05/20. Needs to reschedule eye exam w/ Dr.Groat at [facility].     HPI:  Susan [name] is a 70 y.o. White female who is here for annual health maintenance exam and 3 mo f/u chronic medical problems. A/P as of last visit: "1) Fibromyalgia: doing pretty well. Has some myalgia-type pain L neck/upper back/trap area lately. Flexeril 10mg  tid prn, #60, RF x 1---this has helped for this pain in the past. Continue cymbalta 30mg , 3 tabs qd as well as amitriptyline 25-50mg  qhs prn.  2) Tob dependence: working on cessation currently, nicotine patches at this time, plan is to be completely quit by about a month from now.  3) DM 2: compliant with med. Diet fair. No exercise. A1c and non-fasting glucose check today.  4) HTN: good control.  Cont toprol xl 100mg  bid, hctz 25mg  qd, and lisinopril 10mg  qd. BMET today.  5) HLD: tolerating statin. Good lipids, normal hepatic panel 06/2019. Plan repeat hepatic panel and FLP in 3 mo.  6) Hypoth: Hypoth: Takes T4 on empty stomach w/out any other meds. TSH normal 11/2019. Plan TSH monitoring at f/u in 3 mo.  7) Preventative health:  Flu vacc UTD. Has had covid + booster.   She'll get shingrix at her pharmacy. Pneumovax 23 booster today."  INTERIM HX: She quit smoking cold [place] 1 week ago and feels good about it. Says PN pain in feet has already begun to feel better.  Reports having covid resp infx, pos test 05/25/20. Still with some residual coughing, a bit rattly.  Taking mucinex.   Wheezing not so much anymore.    Has not taken her lisinopril in 4d b/c ran out. No home bp monitoring.  Hypoth: Takes 50 mcg T4 on empty stomach w/out any other meds.    Past Medical History:  Diagnosis Date  . Anxiety and depression   . Atypical chest pain 2016    ruled out for MI, stress test showed no ischemia.  CT angio NO PE.  . Cellulitis of chest wall 12/09/2015   s/p breast surgery  . Colon cancer screening 05/11/2018   2007 colonoscopy normal.  Cologuard neg 05/11/18; repeat 3 yrs.  Marland Kitchen COPD (chronic obstructive pulmonary disease) (HCC)    mild; prn albut per Dr. Lamonte Sakai as of 2017--no f/u w/pulm since then.  . DDD (degenerative disc disease)   . Diabetes mellitus ([place])    w/DPN  . Diabetic peripheral neuropathy ([place])   . DJD (degenerative joint disease)   . Fatty liver disease, nonalcoholic   . Fibromyalgia    cymbalta  . GERD (gastroesophageal reflux disease)   . History of blood clots 2004   during cancer treatment  . History of breast cancer 2004   DCIS->lumpectomy + rad. Eventually go bilat mastectomy in 2017 (Sister had breast cancer in her 62s). Last seen by onc not long after surgery, no further tx or onc f/u needed.  Marland Kitchen History of latent tuberculosis    got approp tx x 9 mo  . History of palpitations 2016   sinus tachycardia.  30 day event monitor was normal (Dr. Irish Lack)  . History of pneumonia   . HTN (hypertension) 01/28/2012  . Hyperlipidemia 01/28/2012  . Hypothyroidism 02/14/2014  . Lipoma 01/05/2018  . Mediastinal adenopathy 2016   on CT angio done  for atypical CP.  Has FH of NHL in sister. Pulm saw her 01/2016 and recommended a f/u CT 09/2016 but this doesn't appear to have been done.  Rpt CT 02/2019->resolved.  . Nasal polyp 08/30/2012  . Obesity   . OSA on CPAP    HOme sleep study 09/02/19-->CPAP (Dr. Ander Slade w/[facility])  . Osteoarthritis    L knee-"end stage"--Dr. Delfino Lovett  . Osteopenia   . Peripheral vascular disease ([place]) 04   axillary,upper chest after 3 weeks on tamoxifen  . Sialoadenitis of submandibular gland 08/27/2018   LEFT. doxy rx'd by ED. ENT f/u the next day->reassured, dx'd with partial left submandibular gland obstruction w/out sign of infxn; f/u prn.  . Tobacco abuse-unspec 10/12/2013    Past  Surgical History:  Procedure Laterality Date  . ABDOMINAL HYSTERECTOMY  1991  . APPENDECTOMY  91  . BILATERAL OOPHORECTOMY  01/2003  . BREAST SURGERY Left 2004   lumpectomy (DCIS)+ radiation  . CARPAL TUNNEL RELEASE     right  . COLONOSCOPY  10/15/2005   Dr. Bronson Ing, poor prep  . HOME SLEEP STUDY  09/02/2019   mod OSA->CPAP  . INCISION AND DRAINAGE ABSCESS Left 12/13/2015   Procedure: DRAINAGE LEFT MASTECTOMY WOUND INFECTION;  Surgeon: Fanny Skates, MD;  Location: [facility];  Service: General;  Laterality: Left;  Marland Kitchen MASTECTOMY W/ SENTINEL NODE BIOPSY Bilateral 10/26/2015   Procedure: RIGHT TOTAL MASTECTOMY WITH RIGHT SENTINEL LYMPH NODE BIOPSY, INJECT BLUE DYE RIGHT BREAST, LEFT BREAST PROPHYLACTIC MASTECTOMY;  Surgeon: Fanny Skates, MD;  Location: [facility];  Service: General;  Laterality: Bilateral;  . NASAL SEPTUM SURGERY  08  . PILONIDAL CYST EXCISION    . THYROIDECTOMY, PARTIAL  mid 80's   right side  . TOTAL KNEE ARTHROPLASTY Right   . TRANSTHORACIC ECHOCARDIOGRAM  05/25/2008   ? mild increase in LV filling pressure, EF 65%, normal LV function/wall motion, no LVH or ventricular enlargement.    Family History  Problem Relation Age of Onset  . Cancer Mother 59       liver cancer, hep c  . Heart disease Mother        chf  . Hypertension Mother   . Hyperlipidemia Mother   . Osteoporosis Mother   . Cancer Father 90       lung; smoker  . COPD Father   . Heart disease Sister        mvp  . Breast cancer Sister        dx. 73s  . Non-Hodgkin's lymphoma Sister 83       large B cell  . Hypertension Brother   . Arthritis Brother   . Kidney disease Brother   . Prostate cancer Brother        dx. early 83s  . Other Daughter        Beal's Syndrome  . Arthritis Daughter        Beal's  . Arthritis Son        Beal's connective tissue disease  . Vision loss Maternal Grandmother   . Dementia Maternal Grandmother   . Coronary artery disease Maternal Grandmother   . Heart  Problems Maternal Grandmother   . Vision loss Paternal Grandfather   . Hypertension Brother   . Benign prostatic hyperplasia Brother   . Leukemia Brother        chronic lymphatic leukemia - small/B cell  . Hypertension Brother   . COPD Brother   . Prostate cancer Brother  dx. mid-60s  . Myelodysplastic syndrome Brother        dx. early 71s  . Hypertension Brother   . Hyperlipidemia Brother   . Prostate cancer Brother        dx. mid-70s  . Melanoma Brother        (x2) melanomas dx. late 60s-early 56s  . Mental illness Brother        schizophrenia  . Breast cancer Brother        dx. 12s  . Cirrhosis Brother        hep c  . Hypertension Sister   . Other Sister        thalessemia, anemia; hx of hysterectomy in her 58s  . Hyperlipidemia Sister   . Gout Sister   . Diverticulitis Sister   . Other Daughter        overweight  . Breast cancer Maternal Aunt 52  . Lung cancer Cousin        maternal 1st cousin dx. 46 or younger; former smoker  . Colon cancer Cousin        maternal 1st cousin dx. late 49s-early 60s  . Cancer Cousin        maternal 1st cousin d. NOS cancer  . Emphysema Paternal Aunt   . Lung cancer Paternal Aunt        d. early 32s; smoker  . Leukemia Cousin        paternal 1st cousin d. early 63s  . Colon cancer Other 23       niece  . Melanoma Other        niece    Social History   Socioeconomic History  . Marital status: Married    Spouse name: Not on file  . Number of children: Not on file  . Years of education: Not on file  . Highest education level: Not on file  Occupational History  . Not on file  Tobacco Use  . Smoking status: Former Smoker    Packs/day: 1.00    Years: 35.00    Pack years: 35.00    Types: Cigarettes    Quit date: 06/10/2020    Years since quitting: 0.0  . Smokeless tobacco: Never Used  Vaping Use  . Vaping Use: Never used  Substance and Sexual Activity  . Alcohol use: No    Alcohol/week: 0.0 standard drinks  .  Drug use: No  . Sexual activity: Never  Other Topics Concern  . Not on file  Social History Narrative   Married, 3 children.  Two have Beal's syndrome (connective tissue d/o similar to Marfan's).   Orig from [place].  [place] since 4s.   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   Tobacco (current as of 08/2018).   No alcohol.   Social Determinants of Health   Financial Resource Strain: Low Risk   . Difficulty of Paying Living Expenses: Not hard at all  Food Insecurity: No Food Insecurity  . Worried About Charity fundraiser in the Last Year: Never true  . Ran Out of Food in the Last Year: Never true  Transportation Needs: No Transportation Needs  . Lack of Transportation (Medical): No  . Lack of Transportation (Non-Medical): No  Physical Activity: Inactive  . Days of Exercise per Week: 0 days  . Minutes of Exercise per Session: 0 min  Stress: No Stress Concern Present  . Feeling of Stress : Not at all  Social Connections: Socially Integrated  . Frequency of Communication with Friends and Family: More than three times a week  . Frequency of Social Gatherings with Friends and Family: Once a week  . Attends Religious Services: More than 4 times per year  . Active Member of Clubs or Organizations: Yes  . Attends Archivist Meetings: More than 4 times per year  . Marital Status: Married  Human resources officer Violence: Not At Risk  . Fear of Current or Ex-Partner: No  . Emotionally Abused: No  . Physically Abused: No  . Sexually Abused: No    Outpatient Medications Prior to Visit  Medication Sig Dispense Refill  . albuterol (PROVENTIL) (2.5 MG/3ML) 0.083% nebulizer solution Take 3 mLs (2.5 mg total) by nebulization every 6 (six) hours as needed for wheezing or shortness of breath. 150 mL 1  . amitriptyline (ELAVIL) 25 MG tablet TAKE 1 TO 2 TABLETS BY  MOUTH AT BEDTIME 180 tablet 3  . cholecalciferol (VITAMIN D)  1000 UNITS tablet Take 1,000 Units by mouth daily.    . cyclobenzaprine (FLEXERIL) 10 MG tablet Take 1 tablet (10 mg total) by mouth 3 (three) times daily as needed for muscle spasms. 60 tablet 1  . diclofenac Sodium (VOLTAREN) 1 % GEL Voltaren 1 % topical gel  APPLY 2 GRAM TO THE AFFECTED AREA(S) BY TOPICAL ROUTE 2 or 3 TIMES PER DAY    . DULoxetine (CYMBALTA) 30 MG capsule TAKE 3 CAPSULES BY MOUTH  DAILY 270 capsule 3  . esomeprazole (NEXIUM) 20 MG capsule Take 1 capsule (20 mg total) by mouth daily at 12 noon. 90 capsule 3  . hydrochlorothiazide (HYDRODIURIL) 25 MG tablet TAKE 1 TABLET BY MOUTH  DAILY 90 tablet 1  . ibuprofen (ADVIL,MOTRIN) 200 MG tablet Take 800 mg by mouth every 6 (six) hours as needed for headache or mild pain.     Marland Kitchen levothyroxine (SYNTHROID) 50 MCG tablet TAKE 1 TABLET BY MOUTH  DAILY BEFORE BREAKFAST 90 tablet 1  . metFORMIN (GLUCOPHAGE) 1000 MG tablet TAKE 1 TABLET BY MOUTH  TWICE DAILY WITH MEALS 180 tablet 1  . metoprolol tartrate (LOPRESSOR) 100 MG tablet TAKE 1 TABLET BY MOUTH  TWICE DAILY 180 tablet 1  . Multiple Vitamins-Minerals (MULTIVITAMIN WITH MINERALS) tablet Take 1 tablet by mouth daily.    . pravastatin (PRAVACHOL) 40 MG tablet TAKE 1 TABLET BY MOUTH  DAILY 90 tablet 1  . lisinopril (ZESTRIL) 10 MG tablet TAKE 1 TABLET BY MOUTH  DAILY 90 tablet 1  . albuterol (PROVENTIL HFA;VENTOLIN HFA) 108 (90 Base) MCG/ACT inhaler INHALE 2 PUFFS INTO THE LUNGS EVERY 4 HOURS AS NEEDED FOR WHEEZING OR SHORTNESS OF BREATH OR COUGH (Patient not taking: Reported on 06/17/2020) 48 Inhaler 4   No facility-administered medications prior to visit.    Allergies  Allergen Reactions  . Chantix [Varenicline] Other (See Comments)    "Felt like having mental breakdown"  . Ciprofloxacin Other (See Comments)    Muscle tightness, tendon aching and pain  . Latex Itching  . Penicillins Anaphylaxis and Swelling  . Tamoxifen Other (See Comments)    Blood clots  . Levaquin  [Levofloxacin] Other (See Comments)    Severe tendon pain  . Lyrica [Pregabalin] Swelling  . Neurontin [Gabapentin] Swelling  . Morphine And Related Other (See Comments)    MORPHINE DRIP - causes pt to feel irritable and itch  . Ceclor [Cefaclor] Rash  . Other Other (See Comments)    Redness from tape and bandaides  .  Sulfa Antibiotics Rash    ROS Review of Systems  Constitutional: Negative for appetite change, chills, fatigue and fever.  HENT: Negative for congestion, dental problem, ear pain and sore throat.   Eyes: Negative for discharge, redness and visual disturbance.  Respiratory: Positive for cough (mild residual from covid--see hpi). Negative for chest tightness, shortness of breath and wheezing.   Cardiovascular: Negative for chest pain, palpitations and leg swelling.  Gastrointestinal: Negative for abdominal pain, blood in stool, diarrhea, nausea and vomiting.  Genitourinary: Negative for difficulty urinating, dysuria, flank pain, frequency, hematuria and urgency.  Musculoskeletal: Negative for arthralgias, back pain, joint swelling, myalgias and neck stiffness.  Skin: Negative for pallor and rash.  Neurological: Negative for dizziness, speech difficulty, weakness and headaches.  Hematological: Negative for adenopathy. Does not bruise/bleed easily.  Psychiatric/Behavioral: Negative for confusion and sleep disturbance. The patient is not nervous/anxious.    PE; Vitals with BMI 06/17/2020 03/16/2020 03/07/2020  Height 5' 3.5" 5' 3.75" 5' 3.75"  Weight 221 lbs 13 oz 223 lbs 10 oz 223 lbs  BMI 38.67 81.19 14.78  Systolic 295 621 308  Diastolic 70 71 66  Pulse 76 61 71   Exam chaperoned by Deveron Furlong, CMA. Gen: Alert, well appearing.  Patient is oriented to person, place, time, and situation. AFFECT: pleasant, lucid thought and speech. ENT: Ears: EACs clear, normal epithelium.  TMs with good light reflex and landmarks bilaterally.  Eyes: no injection, icteris,  swelling, or exudate.  EOMI, PERRLA. Nose: no drainage or turbinate edema/swelling.  No injection or focal lesion.  Mouth: lips without lesion/swelling.  Oral mucosa pink and moist.  Dentition intact and without obvious caries or gingival swelling.  Oropharynx without erythema, exudate, or swelling.  Neck: supple/nontender.  No LAD, mass, or TM.  Carotid pulses 2+ bilaterally, without bruits. CV: RRR, no m/r/g.   LUNGS: CTA bilat, nonlabored resps, good aeration in all lung fields. ABD: soft, NT, ND, BS normal.  No hepatospenomegaly or mass.  No bruits. EXT: no clubbing, cyanosis, or edema.  Musculoskeletal: no joint swelling, erythema, warmth, or tenderness.  ROM of all joints intact. Skin - no sores or suspicious lesions or rashes or color changes Foot exam -  no swelling, tenderness or skin or vascular lesions. Color and temperature is normal. Sensation is diminished to monofilament testing on plantar surfaces bilat. Peripheral pulses are palpable. Toenails are normal.   Pertinent labs:  Lab Results  Component Value Date   TSH 0.85 11/13/2019   Lab Results  Component Value Date   WBC 8.0 08/26/2018   HGB 14.3 08/26/2018   HCT 41.6 08/26/2018   MCV 85.8 08/26/2018   PLT 268 08/26/2018   Lab Results  Component Value Date   CREATININE 0.84 03/16/2020   BUN 18 03/16/2020   NA 137 03/16/2020   K 3.7 03/16/2020   CL 98 03/16/2020   CO2 30 03/16/2020   Lab Results  Component Value Date   ALT 33 06/16/2019   AST 19 06/16/2019   ALKPHOS 62 06/16/2019   BILITOT 0.4 06/16/2019   Lab Results  Component Value Date   CHOL 125 06/16/2019   Lab Results  Component Value Date   HDL 42.60 06/16/2019   Lab Results  Component Value Date   LDLCALC 55 06/16/2019   Lab Results  Component Value Date   TRIG 137.0 06/16/2019   Lab Results  Component Value Date   CHOLHDL 3 06/16/2019   Lab Results  Component Value Date   HGBA1C  6.6 (H) 03/16/2020   ASSESSMENT AND PLAN:   1)  HTN: well controlled. Cont lisin 10mg  qd, lopressor 100mg  qd, and hctz 25mg  qd. Lytes/cr today.  2) DM: good control by last a1c 3 mo ago (6.6%). No home gluc monitoring. Cont metformin 1000 mg bid. Hba1c and urine microalb/cr today. Pt to arrange eye exam. Feet exam today stable mild dec sensation.  3) Hypothyroidism: TSH monitoring today.  4) Tobacco dependence/abuse: she quit smoking 1 week ago and feels good about her chances of maintaining abstinence. Plan for lung ca screening CT in about 6 mo.  5) HLD: tolerating pravastatin 40mg  qd. FLP and hepatic panel today.  6) Health maintenance exam: Reviewed age and gender appropriate health maintenance issues (prudent diet, regular exercise, health risks of tobacco and excessive alcohol, use of seatbelts, fire alarms in home, use of sunscreen).  Also reviewed age and gender appropriate health screening as well as vaccine recommendations. Vaccines: All UTD. Labs: CBC, TSH, CMET, FLP, A1c, urine microalb/cr, diabetic feet exam. Cervical ca screening: n/a->pt w/hx of hysterectomy for nonmalignant dx. Breast ca screening: n/a-->hx of bilat mastectomy for DCIS 10/2015. Colon ca screening: rpt colonoscopy was due 2017 (Dr. Brodie)-->cologuard NEG 05/2018, plan rpt cologuard 05/2021. Osteoporosis screening: 08/2015 DEXA T score of 0.4->recommended repeat any time now->ordered today. Plan for lung ca screening CT in about 6 mo.  An After Visit Summary was printed and given to the patient.  FOLLOW UP:  Return in about 3 months (around 09/14/2020) for routine chronic illness f/u.  Signed:  Crissie Sickles, MD           06/17/2020

## 2020-06-20 LAB — HEMOGLOBIN A1C: Hgb A1c MFr Bld: 6.1 % (ref 4.6–6.5)

## 2020-06-30 DIAGNOSIS — G4733 Obstructive sleep apnea (adult) (pediatric): Secondary | ICD-10-CM | POA: Diagnosis not present

## 2020-07-11 ENCOUNTER — Other Ambulatory Visit: Payer: Self-pay | Admitting: Family Medicine

## 2020-07-18 DIAGNOSIS — G4733 Obstructive sleep apnea (adult) (pediatric): Secondary | ICD-10-CM | POA: Diagnosis not present

## 2020-07-19 ENCOUNTER — Other Ambulatory Visit: Payer: Self-pay | Admitting: Family Medicine

## 2020-08-24 ENCOUNTER — Other Ambulatory Visit: Payer: Self-pay | Admitting: Family Medicine

## 2020-09-02 ENCOUNTER — Other Ambulatory Visit: Payer: Self-pay | Admitting: Family Medicine

## 2020-09-06 LAB — HM DIABETES EYE EXAM

## 2020-09-08 ENCOUNTER — Encounter: Payer: Self-pay | Admitting: Family Medicine

## 2020-09-14 ENCOUNTER — Ambulatory Visit: Payer: Medicare Other | Admitting: Family Medicine

## 2020-09-26 ENCOUNTER — Other Ambulatory Visit: Payer: Self-pay | Admitting: Family Medicine

## 2020-09-26 DIAGNOSIS — E039 Hypothyroidism, unspecified: Secondary | ICD-10-CM

## 2020-09-27 NOTE — Telephone Encounter (Signed)
Pt was last seen 06/17/20, cancelled appt for 5/11. LM for pt to return call regarding rx and appt.

## 2020-10-04 ENCOUNTER — Telehealth (INDEPENDENT_AMBULATORY_CARE_PROVIDER_SITE_OTHER): Payer: Medicare Other | Admitting: Medical

## 2020-10-04 ENCOUNTER — Encounter: Payer: Self-pay | Admitting: Family Medicine

## 2020-10-04 ENCOUNTER — Other Ambulatory Visit: Payer: Self-pay

## 2020-10-04 VITALS — BP 135/66 | HR 74 | Wt 222.0 lb

## 2020-10-04 DIAGNOSIS — R062 Wheezing: Secondary | ICD-10-CM | POA: Diagnosis not present

## 2020-10-04 DIAGNOSIS — J40 Bronchitis, not specified as acute or chronic: Secondary | ICD-10-CM

## 2020-10-04 DIAGNOSIS — J3489 Other specified disorders of nose and nasal sinuses: Secondary | ICD-10-CM

## 2020-10-04 MED ORDER — BUDESONIDE-FORMOTEROL FUMARATE 160-4.5 MCG/ACT IN AERO: INHALATION_SPRAY | RESPIRATORY_TRACT | 0 refills | Status: DC

## 2020-10-04 MED ORDER — BENZONATATE 100 MG PO CAPS: 100.0000 mg | ORAL_CAPSULE | Freq: Three times a day (TID) | ORAL | 0 refills | Status: DC | PRN

## 2020-10-04 MED ORDER — AZITHROMYCIN 250 MG PO TABS: ORAL_TABLET | ORAL | 0 refills | Status: AC

## 2020-10-04 NOTE — Progress Notes (Signed)
   Subjective:    Patient ID: Susan [name], female    DOB: 1950/05/10, 70 y.o.   MRN: [id number]  HPI  Virtual Visit via Video Note  I connected with Susan [name] on 10/04/20 at  8:40 AM EDT by a video enabled telemedicine application and verified that I am speaking with the correct person using two identifiers.  Location: Patient: home Provider: office  particpants- pt and myself.   I discussed the limitations of evaluation and management by telemedicine and the availability of in person appointments. The patient expressed understanding and agreed to proceed.  History of Present Illness: Pt has both nasal congestion, sinus pressure and some chest congestion for 3 weeks. Pt has used mucinex and nebulizer/albuterol. Using neb about twice a day.   Pt states gets hx of uri type symptoms with wheezing easily. In the past pt states uses antibiotics and sometimes even needs to use steroid.  Pt is smoker. Pt states some exposure to daughter who had covid.  Pt did get covid vaccine 3 shots. Tested negative again.   Pt is coughing up mucus.     Observations/Objective:  General-no acute distress, pleasant, oriented. Lungs- on inspection lungs appear unlabored. Neck- no tracheal deviation or jvd on inspection. Neuro- gross motor function appears intact.  Assessment and Plan: Recent bronchitis and sinus pressure symptoms for about 3 weeks.  Also wheezing intermittently.  History of smoking and describes some intermittent bronchitis in the past with need for antibiotic and sometimes prednisone tabs.   Glad to hear that she tested negative for COVID.  Recommend retesting again on Wednesday and let us know immediately if positive results.  Discussed that could prescribe paxlovid.  Will prescribe azithromycin antibiotic for bronchitis and sent in benzonatate for cough.  For wheezing prescribed Symbicort inhaler.  If wheezing worsens despite use of Symbicort or albuterol neb  treatments then would prescribe taper prednisone.  Advised not to smoke.  Patient states currently not smoking and will get nicotine patches over-the-counter.  Follow-up in 7 to 10 days or as needed.  Mackie Pai, PA-C   Time spent with patient today was 20  minutes which consisted of chart revdiew, discussing diagnosis, work up treatment and documentation.  Follow Up Instructions:    I discussed the assessment and treatment plan with the patient. The patient was provided an opportunity to ask questions and all were answered. The patient agreed with the plan and demonstrated an understanding of the instructions.   The patient was advised to call back or seek an in-person evaluation if the symptoms worsen or if the condition fails to improve as anticipated.  Time spent with patient today was 21  minutes which consisted of chart revdiew, discussing diagnosis, work up treatment and documentation.   Mackie Pai, PA-C   Review of Systems  Constitutional: Negative for fatigue.  HENT: Positive for congestion and sinus pressure. Negative for hearing loss and sneezing.   Respiratory: Positive for cough and wheezing.   Cardiovascular: Negative for chest pain and palpitations.  Gastrointestinal: Negative for abdominal pain, nausea and vomiting.  Musculoskeletal: Negative for back pain.  Skin: Negative for rash.  Neurological: Negative for dizziness and headaches.  Hematological: Negative for adenopathy. Does not bruise/bleed easily.  Psychiatric/Behavioral: Negative for behavioral problems, confusion and sleep disturbance. The patient is not nervous/anxious.        Objective:   Physical Exam        Assessment & Plan:

## 2020-10-04 NOTE — Patient Instructions (Signed)
Recent bronchitis and sinus pressure symptoms for about 3 weeks.  Also wheezing intermittently.  History of smoking and describes some intermittent bronchitis in the past with need for antibiotic and sometimes prednisone tabs.   Glad to hear that she tested negative for COVID.  Recommend retesting again on Wednesday and let us know immediately if positive results.  Discussed that could prescribe paxlovid.  Will prescribe azithromycin antibiotic for bronchitis and sent in benzonatate for cough.  For wheezing prescribed Symbicort inhaler.  If wheezing worsens despite use of Symbicort or albuterol neb treatments then would prescribe taper prednisone.  Advised not to smoke.  Patient states currently not smoking and will get nicotine patches over-the-counter.  Follow-up in 7 to 10 days or as needed.

## 2020-10-04 NOTE — Telephone Encounter (Signed)
Need to see. If she hasn't had a neg covid test in the last 5d then needs to be virtual. If neg covid test in the last 5d I'd prefer in person visit.

## 2020-10-14 ENCOUNTER — Other Ambulatory Visit: Payer: Self-pay

## 2020-10-14 ENCOUNTER — Encounter: Payer: Self-pay | Admitting: Family Medicine

## 2020-10-14 ENCOUNTER — Ambulatory Visit (INDEPENDENT_AMBULATORY_CARE_PROVIDER_SITE_OTHER): Payer: Medicare Other | Admitting: Family Medicine

## 2020-10-14 VITALS — BP 107/62 | HR 72 | Temp 98.1°F | Resp 16 | Ht 63.5 in | Wt 225.2 lb

## 2020-10-14 DIAGNOSIS — J209 Acute bronchitis, unspecified: Secondary | ICD-10-CM

## 2020-10-14 DIAGNOSIS — J069 Acute upper respiratory infection, unspecified: Secondary | ICD-10-CM

## 2020-10-14 DIAGNOSIS — R600 Localized edema: Secondary | ICD-10-CM

## 2020-10-14 NOTE — Progress Notes (Signed)
OFFICE VISIT  10/14/2020  CC:  Chief Complaint  Patient presents with   Leg & Feet swelling    X 2 days; feels tight. Normally elevates her feet   HPI:    Patient is a 70 y.o. Caucasian female who presents for legs swelling. Onset 2 d/a most notable in lower pretibial region, better today.  Both legs looked/felt the same.  No legs pain or redness. She does add lots of salt to food.  No recent dietary changes.  10/04/20->video visit for bronchitis.  Was rx'd symbicort but didn't take yet b/c wanted to make sure ok with me. Wheezing, coughing.  No fevers. She just recently stopped smoking (again). Covid test x 2 neg. All sx's gradually better until today, more cough and wheeze.  Feels PND worse at night.  NSAIDs lately: yes, takes 800 mg ibup qhs chronically. BP normal at home lately: normal  ROS as above, plus--> no CP, no SOB,  no dizziness, no HAs, no rashes, no melena/hematochezia.  No polyuria or polydipsia.  No myalgias or arthralgias.  No focal weakness, paresthesias, or tremors.  No acute vision or hearing abnormalities.  No dysuria or unusual/new urinary urgency or frequency.   No n/v/d or abd pain.  No palpitations.     Past Medical History:  Diagnosis Date   Anxiety and depression    Atypical chest pain 2016   ruled out for MI, stress test showed no ischemia.  CT angio NO PE.   Cellulitis of chest wall 12/09/2015   s/p breast surgery   Colon cancer screening 05/11/2018   2007 colonoscopy normal.  Cologuard neg 05/11/18; repeat 3 yrs.   COPD (chronic obstructive pulmonary disease) (HCC)    mild; prn albut per Dr. Lamonte Sakai as of 2017--no f/u w/pulm since then.   DDD (degenerative disc disease)    Diabetes mellitus ([place])    w/DPN   Diabetic peripheral neuropathy (HCC)    DJD (degenerative joint disease)    Fatty liver disease, nonalcoholic    Fibromyalgia    cymbalta   GERD (gastroesophageal reflux disease)    History of blood clots 2004   during cancer treatment    History of breast cancer 2004   DCIS->lumpectomy + rad. Eventually go bilat mastectomy in 2017 (Sister had breast cancer in her 60s). Last seen by onc not long after surgery, no further tx or onc f/u needed.   History of latent tuberculosis    got approp tx x 9 mo   History of palpitations 2016   sinus tachycardia.  30 day event monitor was normal (Dr. Irish Lack)   History of pneumonia    HTN (hypertension) 01/28/2012   Hyperlipidemia 01/28/2012   Hypothyroidism 02/14/2014   Lipoma 01/05/2018   Mediastinal adenopathy 2016   on CT angio done for atypical CP.  Has FH of NHL in sister. Pulm saw her 01/2016 and recommended a f/u CT 09/2016 but this doesn't appear to have been done.  Rpt CT 02/2019->resolved.   Nasal polyp 08/30/2012   Obesity    OSA on CPAP    HOme sleep study 09/02/19-->CPAP (Dr. Ander Slade w/[place] pulm)   Osteoarthritis    L knee-"end stage"--Dr. Delfino Lovett   Osteopenia    Peripheral vascular disease ([place]) 04   axillary,upper chest after 3 weeks on tamoxifen   Sialoadenitis of submandibular gland 08/27/2018   LEFT. doxy rx'd by ED. ENT f/u the next day->reassured, dx'd with partial left submandibular gland obstruction w/out sign of infxn; f/u prn.  Tobacco abuse-unspec 10/12/2013    Past Surgical History:  Procedure Laterality Date   ABDOMINAL HYSTERECTOMY  1991   APPENDECTOMY  91   BILATERAL OOPHORECTOMY  01/2003   BREAST SURGERY Left 2004   lumpectomy (DCIS)+ radiation   CARPAL TUNNEL RELEASE     right   COLONOSCOPY  10/15/2005   Dr. Bronson Ing, poor prep   HOME SLEEP STUDY  09/02/2019   mod OSA->CPAP   INCISION AND DRAINAGE ABSCESS Left 12/13/2015   Procedure: DRAINAGE LEFT MASTECTOMY WOUND INFECTION;  Surgeon: Fanny Skates, MD;  Location: [place];  Service: General;  Laterality: Left;   MASTECTOMY W/ SENTINEL NODE BIOPSY Bilateral 10/26/2015   Procedure: RIGHT TOTAL MASTECTOMY WITH RIGHT SENTINEL LYMPH NODE BIOPSY, INJECT BLUE DYE RIGHT BREAST, LEFT BREAST  PROPHYLACTIC MASTECTOMY;  Surgeon: Fanny Skates, MD;  Location: [place];  Service: General;  Laterality: Bilateral;   NASAL SEPTUM SURGERY  08   PILONIDAL CYST EXCISION     THYROIDECTOMY, PARTIAL  mid 80's   right side   TOTAL KNEE ARTHROPLASTY Right    TRANSTHORACIC ECHOCARDIOGRAM  05/25/2008   ? mild increase in LV filling pressure, EF 65%, normal LV function/wall motion, no LVH or ventricular enlargement.    Outpatient Medications Prior to Visit  Medication Sig Dispense Refill   albuterol (PROVENTIL HFA;VENTOLIN HFA) 108 (90 Base) MCG/ACT inhaler INHALE 2 PUFFS INTO THE LUNGS EVERY 4 HOURS AS NEEDED FOR WHEEZING OR SHORTNESS OF BREATH OR COUGH 48 Inhaler 4   amitriptyline (ELAVIL) 25 MG tablet TAKE 1 TO 2 TABLETS BY  MOUTH AT BEDTIME 180 tablet 3   benzonatate (TESSALON) 100 MG capsule Take 1 capsule (100 mg total) by mouth 3 (three) times daily as needed for cough. 30 capsule 0   cholecalciferol (VITAMIN D) 1000 UNITS tablet Take 1,000 Units by mouth daily.     cyclobenzaprine (FLEXERIL) 10 MG tablet TAKE 1 TABLET BY MOUTH THREE TIMES A DAY AS NEEDED FOR MUSCLE SPASMS 60 tablet 1   diclofenac Sodium (VOLTAREN) 1 % GEL Voltaren 1 % topical gel  APPLY 2 GRAM TO THE AFFECTED AREA(S) BY TOPICAL ROUTE 2 or 3 TIMES PER DAY     DULoxetine (CYMBALTA) 30 MG capsule TAKE 3 CAPSULES BY MOUTH  DAILY 270 capsule 3   esomeprazole (NEXIUM) 20 MG capsule Take 1 capsule (20 mg total) by mouth daily at 12 noon. 90 capsule 3   hydrochlorothiazide (HYDRODIURIL) 25 MG tablet TAKE 1 TABLET BY MOUTH  DAILY 90 tablet 0   ibuprofen (ADVIL,MOTRIN) 200 MG tablet Take 800 mg by mouth every 6 (six) hours as needed for headache or mild pain.      levothyroxine (SYNTHROID) 50 MCG tablet TAKE 1 TABLET BY MOUTH  DAILY BEFORE BREAKFAST 90 tablet 0   lisinopril (ZESTRIL) 10 MG tablet Take 1 tablet (10 mg total) by mouth daily. 90 tablet 3   metFORMIN (GLUCOPHAGE) 1000 MG tablet TAKE 1 TABLET BY MOUTH  TWICE DAILY WITH  MEALS 180 tablet 0   metoprolol tartrate (LOPRESSOR) 100 MG tablet TAKE 1 TABLET BY MOUTH  TWICE DAILY 180 tablet 1   Multiple Vitamins-Minerals (MULTIVITAMIN WITH MINERALS) tablet Take 1 tablet by mouth daily.     pravastatin (PRAVACHOL) 40 MG tablet TAKE 1 TABLET BY MOUTH  DAILY 90 tablet 1   albuterol (PROVENTIL) (2.5 MG/3ML) 0.083% nebulizer solution Take 3 mLs (2.5 mg total) by nebulization every 6 (six) hours as needed for wheezing or shortness of breath. (Patient not taking: Reported on 10/14/2020)  150 mL 1   budesonide-formoterol (SYMBICORT) 160-4.5 MCG/ACT inhaler 2 inhalations twice daily (Patient not taking: Reported on 10/14/2020) 1 each 0   No facility-administered medications prior to visit.    Allergies  Allergen Reactions   Chantix [Varenicline] Other (See Comments)    "Felt like having mental breakdown"   Ciprofloxacin Other (See Comments)    Muscle tightness, tendon aching and pain   Latex Itching   Penicillins Anaphylaxis and Swelling   Tamoxifen Other (See Comments)    Blood clots   Levaquin [Levofloxacin] Other (See Comments)    Severe tendon pain   Lyrica [Pregabalin] Swelling   Neurontin [Gabapentin] Swelling   Morphine And Related Other (See Comments)    MORPHINE DRIP - causes pt to feel irritable and itch   Ceclor [Cefaclor] Rash   Other Other (See Comments)    Redness from tape and bandaides   Sulfa Antibiotics Rash    ROS As per HPI  PE: Vitals with BMI 10/14/2020 10/04/2020 06/17/2020  Height 5' 3.5" - 5' 3.5"  Weight 225 lbs 3 oz 222 lbs 221 lbs 13 oz  BMI 82.95 - 62.13  Systolic 086 578 469  Diastolic 62 66 70  Pulse 72 74 76    Gen: Alert, well appearing.  Patient is oriented to person, place, time, and situation. AFFECT: pleasant, lucid thought and speech. HEENT: eyes without injection, drainage, or swelling.  Ears: EACs clear, TMs with normal light reflex and landmarks.  Nose: Clear rhinorrhea, with some dried, crusty exudate adherent to  mildly injected mucosa.  No purulent d/c.  No paranasal sinus TTP.  No facial swelling.  Throat and mouth without focal lesion.  No pharyngial swelling, erythema, or exudate.   Neck: supple, no LAD.   LUNGS: CTA bilat, nonlabored resps.   CV: RRR, no m/r/g. EXT: no clubbing or cyanosis.  Trace bilat LL pitting edema.  No skin abnormalities, no tenderness. SKIN: no rash   LABS:    Chemistry      Component Value Date/Time   NA 137 06/17/2020 0959   NA 141 04/09/2018 1545   NA 141 09/15/2015 1035   K 3.6 06/17/2020 0959   K 4.0 09/15/2015 1035   CL 95 (L) 06/17/2020 0959   CO2 31 06/17/2020 0959   CO2 30 (H) 09/15/2015 1035   BUN 19 06/17/2020 0959   BUN 12 04/09/2018 1545   BUN 15.5 09/15/2015 1035   CREATININE 0.94 06/17/2020 0959   CREATININE 0.91 01/25/2016 1011   CREATININE 0.9 09/15/2015 1035      Component Value Date/Time   CALCIUM 9.2 06/17/2020 0959   CALCIUM 9.8 09/15/2015 1035   ALKPHOS 60 06/17/2020 0959   ALKPHOS 60 09/15/2015 1035   AST 15 06/17/2020 0959   AST 35 (H) 09/15/2015 1035   ALT 25 06/17/2020 0959   ALT 53 09/15/2015 1035   BILITOT 0.5 06/17/2020 0959   BILITOT 0.3 04/09/2018 1545   BILITOT 0.31 09/15/2015 1035     Lab Results  Component Value Date   WBC 6.0 06/17/2020   HGB 12.4 06/17/2020   HCT 36.2 06/17/2020   MCV 84.9 06/17/2020   PLT 247.0 06/17/2020   Lab Results  Component Value Date   TSH 1.20 06/17/2020   Lab Results  Component Value Date   HGBA1C 6.1 06/17/2020   IMPRESSION AND PLAN:  1) LE edema, mild, resolved. Suspect inc fluid retention d/t excessive Na intake. Low Na diet discussed--discussed benefit of this for her swelling and  her bp.  2) Acute URI with bronchitis. Lingering sx's but improved. Recommended she start the symbicort rx'd 10 d/a. Start otc zyrtec 10mg  qd as well.  An After Visit Summary was printed and given to the patient.  FOLLOW UP: No follow-ups on file.  Signed:  Crissie Sickles, MD            10/14/2020

## 2020-10-18 DIAGNOSIS — G4733 Obstructive sleep apnea (adult) (pediatric): Secondary | ICD-10-CM | POA: Diagnosis not present

## 2020-10-19 ENCOUNTER — Other Ambulatory Visit: Payer: Self-pay | Admitting: Family Medicine

## 2020-10-19 ENCOUNTER — Ambulatory Visit: Payer: Medicare Other | Admitting: Family Medicine

## 2020-10-19 DIAGNOSIS — M797 Fibromyalgia: Secondary | ICD-10-CM

## 2020-11-01 ENCOUNTER — Other Ambulatory Visit: Payer: Self-pay | Admitting: Medical

## 2020-11-16 ENCOUNTER — Other Ambulatory Visit: Payer: Medicare Other

## 2020-12-20 ENCOUNTER — Other Ambulatory Visit: Payer: Self-pay | Admitting: Family Medicine

## 2020-12-20 DIAGNOSIS — E039 Hypothyroidism, unspecified: Secondary | ICD-10-CM

## 2020-12-29 ENCOUNTER — Other Ambulatory Visit: Payer: Self-pay | Admitting: Family Medicine

## 2021-01-13 ENCOUNTER — Encounter: Payer: Self-pay | Admitting: Family Medicine

## 2021-01-13 ENCOUNTER — Telehealth (INDEPENDENT_AMBULATORY_CARE_PROVIDER_SITE_OTHER): Payer: Medicare Other | Admitting: Family Medicine

## 2021-01-13 ENCOUNTER — Other Ambulatory Visit: Payer: Self-pay

## 2021-01-13 DIAGNOSIS — J069 Acute upper respiratory infection, unspecified: Secondary | ICD-10-CM | POA: Diagnosis not present

## 2021-01-13 DIAGNOSIS — I889 Nonspecific lymphadenitis, unspecified: Secondary | ICD-10-CM | POA: Diagnosis not present

## 2021-01-13 MED ORDER — AZITHROMYCIN 250 MG PO TABS: ORAL_TABLET | ORAL | 0 refills | Status: DC

## 2021-01-13 NOTE — Progress Notes (Signed)
Virtual Visit via Video Note  I connected with pt on 01/13/21 at  4:00 PM EDT by a video enabled telemedicine application and verified that I am speaking with the correct person using two identifiers.  Location patient: home, [name] Location provider:work or home office Persons participating in the virtual visit: patient, provider  I discussed the limitations of evaluation and management by telemedicine and the availability of in person appointments. The patient expressed understanding and agreed to proceed.  Telemedicine visit is a necessity given the COVID-19 restrictions in place at the current time.  HPI: 70 y/o WF being seen today for "hurts to swallow". Has lots of nasal/sinus PND recently x 1 wk, recently improved significantly, just some nasal congestion remains. Now with the sensation of pain/ache/fullness in L side of throat only when swallows--this began getting worse today.  Says it actually feels like it is in L side of neck in front.  Says glands were swollen in this spot last week but now seems to have gone down but still sore to touch. She still smokes 5 cigs/day and asks if this might be throat cancer. Susan [name] has had similar URI illness the last 3 wks. No fever or SOB.   ROS: See pertinent positives and negatives per HPI.  Past Medical History:  Diagnosis Date   Anxiety and depression    Atypical chest pain 2016   ruled out for MI, stress test showed no ischemia.  CT angio NO PE.   Cellulitis of chest wall 12/09/2015   s/p breast surgery   Colon cancer screening 05/11/2018   2007 colonoscopy normal.  Cologuard neg 05/11/18; repeat 3 yrs.   COPD (chronic obstructive pulmonary disease) (HCC)    mild; prn albut per Dr. Lamonte Sakai as of 2017--no f/u w/pulm since then.   DDD (degenerative disc disease)    Diabetes mellitus ([place])    w/DPN   Diabetic peripheral neuropathy (HCC)    DJD (degenerative joint disease)    Fatty liver disease, nonalcoholic    Fibromyalgia     cymbalta   GERD (gastroesophageal reflux disease)    History of blood clots 2004   during cancer treatment   History of breast cancer 2004   DCIS->lumpectomy + rad. Eventually go bilat mastectomy in 2017 (Sister had breast cancer in her 19s). Last seen by onc not long after surgery, no further tx or onc f/u needed.   History of latent tuberculosis    got approp tx x 9 mo   History of palpitations 2016   sinus tachycardia.  30 day event monitor was normal (Dr. Irish Lack)   History of pneumonia    HTN (hypertension) 01/28/2012   Hyperlipidemia 01/28/2012   Hypothyroidism 02/14/2014   Lipoma 01/05/2018   Mediastinal adenopathy 2016   on CT angio done for atypical CP.  Has FH of NHL in sister. Pulm saw her 01/2016 and recommended a f/u CT 09/2016 but this doesn't appear to have been done.  Rpt CT 02/2019->resolved.   Nasal polyp 08/30/2012   Obesity    OSA on CPAP    HOme sleep study 09/02/19-->CPAP (Dr. Ander Slade w/[place] pulm)   Osteoarthritis    L knee-"end stage"--Dr. Delfino Lovett   Osteopenia    Peripheral vascular disease ([place]) 04   axillary,upper chest after 3 weeks on tamoxifen   Sialoadenitis of submandibular gland 08/27/2018   LEFT. doxy rx'd by ED. ENT f/u the next day->reassured, dx'd with partial left submandibular gland obstruction w/out sign of infxn; f/u prn.   Tobacco  abuse-unspec 10/12/2013    Past Surgical History:  Procedure Laterality Date   ABDOMINAL HYSTERECTOMY  1991   APPENDECTOMY  91   BILATERAL OOPHORECTOMY  01/2003   BREAST SURGERY Left 2004   lumpectomy (DCIS)+ radiation   CARPAL TUNNEL RELEASE     right   COLONOSCOPY  10/15/2005   Dr. Bronson Ing, poor prep   HOME SLEEP STUDY  09/02/2019   mod OSA->CPAP   INCISION AND DRAINAGE ABSCESS Left 12/13/2015   Procedure: DRAINAGE LEFT MASTECTOMY WOUND INFECTION;  Surgeon: Fanny Skates, MD;  Location: [place];  Service: General;  Laterality: Left;   MASTECTOMY W/ SENTINEL NODE BIOPSY Bilateral 10/26/2015   Procedure:  RIGHT TOTAL MASTECTOMY WITH RIGHT SENTINEL LYMPH NODE BIOPSY, INJECT BLUE DYE RIGHT BREAST, LEFT BREAST PROPHYLACTIC MASTECTOMY;  Surgeon: Fanny Skates, MD;  Location: [place];  Service: General;  Laterality: Bilateral;   NASAL SEPTUM SURGERY  08   PILONIDAL CYST EXCISION     THYROIDECTOMY, PARTIAL  mid 80's   right side   TOTAL KNEE ARTHROPLASTY Right    TRANSTHORACIC ECHOCARDIOGRAM  05/25/2008   ? mild increase in LV filling pressure, EF 65%, normal LV function/wall motion, no LVH or ventricular enlargement.     Current Outpatient Medications:    amitriptyline (ELAVIL) 25 MG tablet, TAKE 1 TO 2 TABLETS BY  MOUTH AT BEDTIME, Disp: 180 tablet, Rfl: 3   azithromycin (ZITHROMAX) 250 MG tablet, 2 tabs po qd x 1d, then 1 tab po qd x 4d, Disp: 6 tablet, Rfl: 0   cholecalciferol (VITAMIN D) 1000 UNITS tablet, Take 1,000 Units by mouth daily., Disp: , Rfl:    cyclobenzaprine (FLEXERIL) 10 MG tablet, TAKE 1 TABLET BY MOUTH THREE TIMES A DAY AS NEEDED FOR MUSCLE SPASMS, Disp: 60 tablet, Rfl: 1   diclofenac Sodium (VOLTAREN) 1 % GEL, Voltaren 1 % topical gel  APPLY 2 GRAM TO THE AFFECTED AREA(S) BY TOPICAL ROUTE 2 or 3 TIMES PER DAY, Disp: , Rfl:    DULoxetine (CYMBALTA) 30 MG capsule, TAKE 3 CAPSULES BY MOUTH  DAILY, Disp: 270 capsule, Rfl: 1   esomeprazole (NEXIUM) 20 MG capsule, Take 1 capsule (20 mg total) by mouth daily at 12 noon., Disp: 90 capsule, Rfl: 3   hydrochlorothiazide (HYDRODIURIL) 25 MG tablet, TAKE 1 TABLET BY MOUTH  DAILY, Disp: 30 tablet, Rfl: 0   ibuprofen (ADVIL,MOTRIN) 200 MG tablet, Take 800 mg by mouth every 6 (six) hours as needed for headache or mild pain. , Disp: , Rfl:    levothyroxine (SYNTHROID) 50 MCG tablet, TAKE 1 TABLET BY MOUTH  DAILY BEFORE BREAKFAST, Disp: 30 tablet, Rfl: 0   lisinopril (ZESTRIL) 10 MG tablet, Take 1 tablet (10 mg total) by mouth daily., Disp: 90 tablet, Rfl: 3   metFORMIN (GLUCOPHAGE) 1000 MG tablet, TAKE 1 TABLET BY MOUTH  TWICE DAILY WITH MEALS,  Disp: 60 tablet, Rfl: 0   metoprolol tartrate (LOPRESSOR) 100 MG tablet, TAKE 1 TABLET BY MOUTH  TWICE DAILY, Disp: 180 tablet, Rfl: 1   Multiple Vitamins-Minerals (MULTIVITAMIN WITH MINERALS) tablet, Take 1 tablet by mouth daily., Disp: , Rfl:    pravastatin (PRAVACHOL) 40 MG tablet, TAKE 1 TABLET BY MOUTH  DAILY, Disp: 90 tablet, Rfl: 1   albuterol (PROVENTIL HFA;VENTOLIN HFA) 108 (90 Base) MCG/ACT inhaler, INHALE 2 PUFFS INTO THE LUNGS EVERY 4 HOURS AS NEEDED FOR WHEEZING OR SHORTNESS OF BREATH OR COUGH (Patient not taking: Reported on 01/13/2021), Disp: 48 Inhaler, Rfl: 4   albuterol (PROVENTIL) (2.5 MG/3ML)  0.083% nebulizer solution, Take 3 mLs (2.5 mg total) by nebulization every 6 (six) hours as needed for wheezing or shortness of breath. (Patient not taking: No sig reported), Disp: 150 mL, Rfl: 1   benzonatate (TESSALON) 100 MG capsule, Take 1 capsule (100 mg total) by mouth 3 (three) times daily as needed for cough. (Patient not taking: Reported on 01/13/2021), Disp: 30 capsule, Rfl: 0   budesonide-formoterol (SYMBICORT) 160-4.5 MCG/ACT inhaler, INHALE 2 PUFFS BY MOUTH TWICE A DAY (Patient not taking: Reported on 01/13/2021), Disp: 10.2 each, Rfl: 2  EXAM:  VITALS per patient if applicable:  Vitals with BMI 10/14/2020 10/04/2020 06/17/2020  Height 5' 3.5" - 5' 3.5"  Weight 225 lbs 3 oz 222 lbs 221 lbs 13 oz  BMI 99991111 - 0000000  Systolic [id number]  Diastolic 62 66 70  Pulse 72 74 76     GENERAL: alert, oriented, appears well and in no acute distress  HEENT: atraumatic, conjunttiva clear, no obvious abnormalities on inspection of external nose and ears  NECK: normal movements of the head and neck  LUNGS: on inspection no signs of respiratory distress, breathing rate appears normal, no obvious gross SOB, gasping or wheezing  CV: no obvious cyanosis  MS: moves all visible extremities without noticeable abnormality  PSYCH/NEURO: pleasant and cooperative, no obvious depression or  anxiety, speech and thought processing grossly intact  LABS: none today    Chemistry      Component Value Date/Time   NA 137 06/17/2020 0959   NA 141 04/09/2018 1545   NA 141 09/15/2015 1035   K 3.6 06/17/2020 0959   K 4.0 09/15/2015 1035   CL 95 (L) 06/17/2020 0959   CO2 31 06/17/2020 0959   CO2 30 (H) 09/15/2015 1035   BUN 19 06/17/2020 0959   BUN 12 04/09/2018 1545   BUN 15.5 09/15/2015 1035   CREATININE 0.94 06/17/2020 0959   CREATININE 0.91 01/25/2016 1011   CREATININE 0.9 09/15/2015 1035      Component Value Date/Time   CALCIUM 9.2 06/17/2020 0959   CALCIUM 9.8 09/15/2015 1035   ALKPHOS 60 06/17/2020 0959   ALKPHOS 60 09/15/2015 1035   AST 15 06/17/2020 0959   AST 35 (H) 09/15/2015 1035   ALT 25 06/17/2020 0959   ALT 53 09/15/2015 1035   BILITOT 0.5 06/17/2020 0959   BILITOT 0.3 04/09/2018 1545   BILITOT 0.31 09/15/2015 1035     Lab Results  Component Value Date   WBC 6.0 06/17/2020   HGB 12.4 06/17/2020   HCT 36.2 06/17/2020   MCV 84.9 06/17/2020   PLT 247.0 06/17/2020   Lab Results  Component Value Date   TSH 1.20 06/17/2020   Lab Results  Component Value Date   HGBA1C 6.1 06/17/2020   ASSESSMENT AND PLAN:  Discussed the following assessment and plan:  URI, lymphadenitis (reactive vs bacterial). Pt with multiple antibiotic allergies/intolerances. Azith 500 x 1d, then 250 qd x 4d. Ibup or tylenol q6h prn.   I discussed the assessment and treatment plan with the patient. The patient was provided an opportunity to ask questions and all were answered. The patient agreed with the plan and demonstrated an understanding of the instructions.   F/u: call in 5d or so if not signif improved.  Signed:  Crissie Sickles, MD           01/13/2021

## 2021-01-17 ENCOUNTER — Other Ambulatory Visit: Payer: Self-pay | Admitting: Family Medicine

## 2021-01-17 NOTE — Telephone Encounter (Signed)
Rx has been taken care of

## 2021-01-17 NOTE — Telephone Encounter (Signed)
Patient called in to let us know that she is completely out of medication,

## 2021-01-18 DIAGNOSIS — G4733 Obstructive sleep apnea (adult) (pediatric): Secondary | ICD-10-CM | POA: Diagnosis not present

## 2021-01-19 DIAGNOSIS — M1712 Unilateral primary osteoarthritis, left knee: Secondary | ICD-10-CM | POA: Diagnosis not present

## 2021-02-01 ENCOUNTER — Telehealth: Payer: Self-pay | Admitting: *Deleted

## 2021-02-01 ENCOUNTER — Ambulatory Visit: Payer: Medicare Other

## 2021-02-01 NOTE — Telephone Encounter (Signed)
Unable to reach patient for AWV

## 2021-02-02 ENCOUNTER — Other Ambulatory Visit: Payer: Self-pay | Admitting: Family Medicine

## 2021-02-02 ENCOUNTER — Telehealth: Payer: Self-pay | Admitting: Family Medicine

## 2021-02-02 DIAGNOSIS — E039 Hypothyroidism, unspecified: Secondary | ICD-10-CM

## 2021-02-02 NOTE — Telephone Encounter (Signed)
No answer unable to leave a message for patient to call back and schedule Medicare Annual Wellness Visit (AWV) by video or phone  Last AWV 01/27/2020  Please schedule at any time with [facility].

## 2021-02-07 ENCOUNTER — Other Ambulatory Visit: Payer: Self-pay | Admitting: Family Medicine

## 2021-02-10 ENCOUNTER — Telehealth: Payer: Self-pay

## 2021-02-10 NOTE — Telephone Encounter (Signed)
Patient due for  "Return in about 3 months (around 09/14/2020) for routine chronic illness f/u."  Left message on vm for patient to call office to schedule appt

## 2021-03-03 DIAGNOSIS — M1712 Unilateral primary osteoarthritis, left knee: Secondary | ICD-10-CM | POA: Diagnosis not present

## 2021-03-24 ENCOUNTER — Other Ambulatory Visit: Payer: Self-pay | Admitting: Family Medicine

## 2021-03-24 DIAGNOSIS — E2839 Other primary ovarian failure: Secondary | ICD-10-CM

## 2021-03-29 ENCOUNTER — Other Ambulatory Visit: Payer: Medicare Other

## 2021-04-05 ENCOUNTER — Other Ambulatory Visit: Payer: Self-pay | Admitting: Family Medicine

## 2021-04-05 DIAGNOSIS — M797 Fibromyalgia: Secondary | ICD-10-CM

## 2021-04-12 ENCOUNTER — Other Ambulatory Visit: Payer: Self-pay | Admitting: Family Medicine

## 2021-04-12 DIAGNOSIS — E039 Hypothyroidism, unspecified: Secondary | ICD-10-CM

## 2021-04-19 DIAGNOSIS — G4733 Obstructive sleep apnea (adult) (pediatric): Secondary | ICD-10-CM | POA: Diagnosis not present

## 2021-05-03 ENCOUNTER — Other Ambulatory Visit: Payer: Self-pay

## 2021-05-03 ENCOUNTER — Emergency Department (INDEPENDENT_AMBULATORY_CARE_PROVIDER_SITE_OTHER): Payer: Medicare Other

## 2021-05-03 ENCOUNTER — Emergency Department
Admission: EM | Admit: 2021-05-03 | Discharge: 2021-05-03 | Disposition: A | Discharge status: Home / Self Care | Payer: Medicare Other | Source: Home / Self Care | Attending: Emergency Medicine | Admitting: Emergency Medicine

## 2021-05-03 DIAGNOSIS — J441 Chronic obstructive pulmonary disease with (acute) exacerbation: Secondary | ICD-10-CM

## 2021-05-03 DIAGNOSIS — R059 Cough, unspecified: Secondary | ICD-10-CM | POA: Diagnosis not present

## 2021-05-03 DIAGNOSIS — J988 Other specified respiratory disorders: Secondary | ICD-10-CM

## 2021-05-03 DIAGNOSIS — J449 Chronic obstructive pulmonary disease, unspecified: Secondary | ICD-10-CM

## 2021-05-03 DIAGNOSIS — Z760 Encounter for issue of repeat prescription: Secondary | ICD-10-CM

## 2021-05-03 LAB — POC SARS CORONAVIRUS 2 AG -  ED: SARS Coronavirus 2 Ag: NEGATIVE

## 2021-05-03 LAB — POCT INFLUENZA A/B
Influenza A, POC: NEGATIVE
Influenza B, POC: NEGATIVE

## 2021-05-03 MED ORDER — ALBUTEROL SULFATE HFA 108 (90 BASE) MCG/ACT IN AERS: 1.0000 | INHALATION_SPRAY | RESPIRATORY_TRACT | 0 refills | Status: DC | PRN

## 2021-05-03 MED ORDER — AZITHROMYCIN 250 MG PO TABS: 250.0000 mg | ORAL_TABLET | Freq: Every day | ORAL | 0 refills | Status: DC

## 2021-05-03 MED ORDER — AEROCHAMBER PLUS MISC: 2 refills | Status: DC

## 2021-05-03 MED ORDER — BUDESONIDE-FORMOTEROL FUMARATE 160-4.5 MCG/ACT IN AERO: 2.0000 | INHALATION_SPRAY | Freq: Two times a day (BID) | RESPIRATORY_TRACT | 0 refills | Status: DC

## 2021-05-03 MED ORDER — PREDNISONE 20 MG PO TABS: 40.0000 mg | ORAL_TABLET | Freq: Every day | ORAL | 0 refills | Status: AC

## 2021-05-03 MED ORDER — BENZONATATE 200 MG PO CAPS: 200.0000 mg | ORAL_CAPSULE | Freq: Three times a day (TID) | ORAL | 0 refills | Status: DC | PRN

## 2021-05-03 NOTE — ED Triage Notes (Signed)
Pt states that she has a cough, and chest congestion. X4 days  Pt states that she does have some shallow breathing as well.  Pt states that she has been told in the past that she has COPD.  Pt states that she is vaccinated for covid.  Pt states that she has had flu vaccine.

## 2021-05-03 NOTE — Discharge Instructions (Addendum)
Your COVID,  flu were negative.  You do not have a pneumonia on your x-ray.  2 puffs from your albuterol inhaler every 4 hours for 2 days, then every 6 hours for 2 days, then as needed.  You can back off on the albuterol if you start to feel better sooner.  Finish the prednisone, although this will elevate your sugars.  Unfortunately, you are allergic to cephalosporins, fluoroquinolones which include Levaquin, in addition to Augmentin, so the only antibiotic choice that I can treat you with is azithromycin.  Please finish it even if you feel better.  Please follow-up with primary care [facility] as soon as you can.  Go immediately to the ER for you get worse or for the signs and symptoms we discussed.

## 2021-05-03 NOTE — ED Provider Notes (Signed)
HPI  SUBJECTIVE:  Susan [name] is a 70 y.o. female who presents with 5 days of a deep cough that is occasionally productive of yellow mucus, rhinorrhea, wheezing, dyspnea on exertion,  diffuse upper chest tightness and left lower chest soreness present only with coughing.  She denies chest pressure, heaviness, pleuritic chest pain.   No fevers, headaches, body aches, nasal congestion, hemoptysis, nausea, vomiting, sinus pain and pressure, postnasal drip, shortness of breath at rest.  She states this is identical to previous COPD exacerbations.  No antibiotics in the past 3 months.  No antipyretic in the past 6 hours.  She tried her albuterol nebulizer once, NyQuil and keeping her head elevated.  The albuterol helps.  Symptoms worse with activity.  She has a past medical history of COVID in 2021, pneumonia, COPD, PE when on tamoxifen in 2004, diabetes, hypertension, fibromyalgia, bilateral breast cancer.  She denies history of chronic kidney disease, prolonged QT.  ZWC:HENIDPO, Adrian Blackwater, MD   Past Medical History:  Diagnosis Date   Anxiety and depression    Atypical chest pain 2016   ruled out for MI, stress test showed no ischemia.  CT angio NO PE.   Cellulitis of chest wall 12/09/2015   s/p breast surgery   Colon cancer screening 05/11/2018   2007 colonoscopy normal.  Cologuard neg 05/11/18; repeat 3 yrs.   COPD (chronic obstructive pulmonary disease) (HCC)    mild; prn albut per Dr. Lamonte Sakai as of 2017--no f/u w/pulm since then.   DDD (degenerative disc disease)    Diabetes mellitus ([place])    w/DPN   Diabetic peripheral neuropathy (HCC)    DJD (degenerative joint disease)    Fatty liver disease, nonalcoholic    Fibromyalgia    cymbalta   GERD (gastroesophageal reflux disease)    History of blood clots 2004   during cancer treatment   History of breast cancer 2004   DCIS->lumpectomy + rad. Eventually go bilat mastectomy in 2017 (Sister had breast cancer in her 86s). Last seen by onc not  long after surgery, no further tx or onc f/u needed.   History of latent tuberculosis    got approp tx x 9 mo   History of palpitations 2016   sinus tachycardia.  30 day event monitor was normal (Dr. Irish Lack)   History of pneumonia    HTN (hypertension) 01/28/2012   Hyperlipidemia 01/28/2012   Hypothyroidism 02/14/2014   Lipoma 01/05/2018   Mediastinal adenopathy 2016   on CT angio done for atypical CP.  Has FH of NHL in sister. Pulm saw her 01/2016 and recommended a f/u CT 09/2016 but this doesn't appear to have been done.  Rpt CT 02/2019->resolved.   Nasal polyp 08/30/2012   Obesity    OSA on CPAP    HOme sleep study 09/02/19-->CPAP (Dr. Ander Slade w/[facility])   Osteoarthritis    L knee-"end stage"--Dr. Delfino Lovett   Osteopenia    Peripheral vascular disease ([place]) 04   axillary,upper chest after 3 weeks on tamoxifen   Sialoadenitis of submandibular gland 08/27/2018   LEFT. doxy rx'd by ED. ENT f/u the next day->reassured, dx'd with partial left submandibular gland obstruction w/out sign of infxn; f/u prn.   Tobacco abuse-unspec 10/12/2013    Past Surgical History:  Procedure Laterality Date   ABDOMINAL HYSTERECTOMY  1991   APPENDECTOMY  91   BILATERAL OOPHORECTOMY  01/2003   BREAST SURGERY Left 2004   lumpectomy (DCIS)+ radiation   CARPAL TUNNEL RELEASE  right   COLONOSCOPY  10/15/2005   Dr. Bronson Ing, poor prep   HOME SLEEP STUDY  09/02/2019   mod OSA->CPAP   INCISION AND DRAINAGE ABSCESS Left 12/13/2015   Procedure: DRAINAGE LEFT MASTECTOMY WOUND INFECTION;  Surgeon: Fanny Skates, MD;  Location: [place];  Service: General;  Laterality: Left;   MASTECTOMY W/ SENTINEL NODE BIOPSY Bilateral 10/26/2015   Procedure: RIGHT TOTAL MASTECTOMY WITH RIGHT SENTINEL LYMPH NODE BIOPSY, INJECT BLUE DYE RIGHT BREAST, LEFT BREAST PROPHYLACTIC MASTECTOMY;  Surgeon: Fanny Skates, MD;  Location: [place];  Service: General;  Laterality: Bilateral;   NASAL SEPTUM SURGERY  08   PILONIDAL CYST  EXCISION     THYROIDECTOMY, PARTIAL  mid 80's   right side   TOTAL KNEE ARTHROPLASTY Right    TRANSTHORACIC ECHOCARDIOGRAM  05/25/2008   ? mild increase in LV filling pressure, EF 65%, normal LV function/wall motion, no LVH or ventricular enlargement.    Family History  Problem Relation Age of Onset   Cancer Mother 82       liver cancer, hep c   Heart disease Mother        chf   Hypertension Mother    Hyperlipidemia Mother    Osteoporosis Mother    Cancer Father 50       lung; smoker   COPD Father    Heart disease Sister        mvp   Breast cancer Sister        dx. 52s   Non-Hodgkin's lymphoma Sister 88       large B cell   Cancer Sister    Hypertension Sister    Other Sister        thalessemia, anemia; hx of hysterectomy in her 28s   Hyperlipidemia Sister    Gout Sister    Diverticulitis Sister    Hypertension Brother    Arthritis Brother    Kidney disease Brother    Prostate cancer Brother        dx. early 17s   Hypertension Brother    Benign prostatic hyperplasia Brother    Leukemia Brother        chronic lymphatic leukemia - small/B cell   Hypertension Brother    COPD Brother    Prostate cancer Brother        dx. mid-60s   Myelodysplastic syndrome Brother        dx. early 57s   Hypertension Brother    Hyperlipidemia Brother    Prostate cancer Brother        dx. mid-70s   Melanoma Brother        (x2) melanomas dx. late 60s-early 70s   Mental illness Brother        schizophrenia   Breast cancer Brother        dx. 57s   Cirrhosis Brother        hep c   Vision loss Maternal Grandmother    Dementia Maternal Grandmother    Coronary artery disease Maternal Grandmother    Heart Problems Maternal Grandmother    Vision loss Paternal Grandfather    Other Daughter        Beal's Syndrome   Arthritis Daughter        Beal's   Other Daughter        overweight   Arthritis Son        Beal's connective tissue disease   Breast cancer Maternal Aunt 68    Emphysema Paternal Aunt    Lung  cancer Paternal Aunt        d. early 50s; smoker   Lung cancer Cousin        maternal 1st cousin dx. 1 or younger; former smoker   Colon cancer Cousin        maternal 1st cousin dx. late 50s-early 37s   Cancer Cousin        maternal 1st cousin d. NOS cancer   Leukemia Cousin        paternal 1st cousin d. early 40s   Colon cancer Other 77       niece   Melanoma Other        niece    Social History   Tobacco Use   Smoking status: Some Days    Packs/day: 0.50    Years: 35.00    Pack years: 17.50    Types: Cigarettes   Smokeless tobacco: Never  Vaping Use   Vaping Use: Never used  Substance Use Topics   Alcohol use: No    Alcohol/week: 0.0 standard drinks   Drug use: No    No current facility-administered medications for this encounter.  Current Outpatient Medications:    albuterol (PROVENTIL HFA;VENTOLIN HFA) 108 (90 Base) MCG/ACT inhaler, INHALE 2 PUFFS INTO THE LUNGS EVERY 4 HOURS AS NEEDED FOR WHEEZING OR SHORTNESS OF BREATH OR COUGH, Disp: 48 Inhaler, Rfl: 4   albuterol (PROVENTIL) (2.5 MG/3ML) 0.083% nebulizer solution, Take 3 mLs (2.5 mg total) by nebulization every 6 (six) hours as needed for wheezing or shortness of breath., Disp: 150 mL, Rfl: 1   albuterol (VENTOLIN HFA) 108 (90 Base) MCG/ACT inhaler, Inhale 1-2 puffs into the lungs every 4 (four) hours as needed for wheezing or shortness of breath., Disp: 1 each, Rfl: 0   amitriptyline (ELAVIL) 25 MG tablet, TAKE 1 TO 2 TABLETS BY  MOUTH AT BEDTIME, Disp: 180 tablet, Rfl: 3   azithromycin (ZITHROMAX) 250 MG tablet, Take 1 tablet (250 mg total) by mouth daily. 2 tabs po on day 1, 1 tab po on days 2-5, Disp: 6 tablet, Rfl: 0   benzonatate (TESSALON) 200 MG capsule, Take 1 capsule (200 mg total) by mouth 3 (three) times daily as needed for cough., Disp: 30 capsule, Rfl: 0   cholecalciferol (VITAMIN D) 1000 UNITS tablet, Take 1,000 Units by mouth daily., Disp: , Rfl:    cyclobenzaprine  (FLEXERIL) 10 MG tablet, TAKE 1 TABLET BY MOUTH THREE TIMES A DAY AS NEEDED FOR MUSCLE SPASMS, Disp: 60 tablet, Rfl: 1   diclofenac Sodium (VOLTAREN) 1 % GEL, Voltaren 1 % topical gel  APPLY 2 GRAM TO THE AFFECTED AREA(S) BY TOPICAL ROUTE 2 or 3 TIMES PER DAY, Disp: , Rfl:    DULoxetine (CYMBALTA) 30 MG capsule, TAKE 3 CAPSULES BY MOUTH  DAILY, Disp: 270 capsule, Rfl: 0   esomeprazole (NEXIUM) 20 MG capsule, Take 1 capsule (20 mg total) by mouth daily at 12 noon., Disp: 90 capsule, Rfl: 3   hydrochlorothiazide (HYDRODIURIL) 25 MG tablet, TAKE 1 TABLET BY MOUTH  DAILY, Disp: 90 tablet, Rfl: 0   ibuprofen (ADVIL,MOTRIN) 200 MG tablet, Take 800 mg by mouth every 6 (six) hours as needed for headache or mild pain. , Disp: , Rfl:    levothyroxine (SYNTHROID) 50 MCG tablet, TAKE 1 TABLET BY MOUTH  DAILY BEFORE BREAKFAST, Disp: 90 tablet, Rfl: 0   lisinopril (ZESTRIL) 10 MG tablet, Take 1 tablet (10 mg total) by mouth daily., Disp: 90 tablet, Rfl: 3   metFORMIN (GLUCOPHAGE) 1000  MG tablet, TAKE 1 TABLET BY MOUTH  TWICE DAILY WITH MEALS, Disp: 180 tablet, Rfl: 0   metoprolol tartrate (LOPRESSOR) 100 MG tablet, TAKE 1 TABLET BY MOUTH  TWICE DAILY, Disp: 180 tablet, Rfl: 0   Multiple Vitamins-Minerals (MULTIVITAMIN WITH MINERALS) tablet, Take 1 tablet by mouth daily., Disp: , Rfl:    pravastatin (PRAVACHOL) 40 MG tablet, TAKE 1 TABLET BY MOUTH  DAILY, Disp: 90 tablet, Rfl: 1   predniSONE (DELTASONE) 20 MG tablet, Take 2 tablets (40 mg total) by mouth daily with breakfast for 5 days., Disp: 10 tablet, Rfl: 0   Spacer/Aero-Holding Chambers (AEROCHAMBER PLUS) inhaler, Use with inhaler, Disp: 1 each, Rfl: 2   budesonide-formoterol (SYMBICORT) 160-4.5 MCG/ACT inhaler, Inhale 2 puffs into the lungs 2 (two) times daily. INHALE 2 PUFFS BY MOUTH TWICE A DAY, Disp: 10.2 g, Rfl: 0  Allergies  Allergen Reactions   Chantix [Varenicline] Other (See Comments)    "Felt like having mental breakdown"   Ciprofloxacin Other  (See Comments)    Muscle tightness, tendon aching and pain   Latex Itching   Penicillins Anaphylaxis and Swelling   Tamoxifen Other (See Comments)    Blood clots   Levaquin [Levofloxacin] Other (See Comments)    Severe tendon pain   Lyrica [Pregabalin] Swelling   Neurontin [Gabapentin] Swelling   Morphine And Related Other (See Comments)    MORPHINE DRIP - causes pt to feel irritable and itch   Ceclor [Cefaclor] Rash   Other Other (See Comments)    Redness from tape and bandaides   Sulfa Antibiotics Rash     ROS  As noted in HPI.   Physical Exam  BP 123/73 (BP Location: Left Arm)    Pulse (!) 108    Temp 98.4 F (36.9 C)    Resp 16    Ht 5\' 4"  (1.626 m)    Wt 103.4 kg    SpO2 100%    BMI 39.14 kg/m   Constitutional: Well developed, well nourished, no acute distress Eyes:  EOMI, conjunctiva normal bilaterally HENT: Normocephalic, atraumatic,mucus membranes moist Respiratory: Normal inspiratory effort, diffuse inspiratory and expiratory wheezing throughout all lung fields.  No rales or rhonchi.  No anterior, lateral chest wall tenderness. Cardiovascular: Mild regular tachycardia, no murmurs, rubs, gallops GI: nondistended skin: No rash, skin intact Musculoskeletal: Calves symmetric, nontender, no edema. Neurologic: Alert & oriented x 3, no focal neuro deficits Psychiatric: Speech and behavior appropriate   ED Course   Medications - No data to display  Orders Placed This Encounter  Procedures   DG Chest 2 View    Standing Status:   Standing    Number of Occurrences:   1    Order Specific Question:   Reason for Exam (SYMPTOM  OR DIAGNOSIS REQUIRED)    Answer:   Cough, history of COPD, rule out pneumonia   POC SARS Coronavirus 2 Ag-ED - Nasal Swab    Standing Status:   Standing    Number of Occurrences:   1   POCT Influenza A/B    Standing Status:   Standing    Number of Occurrences:   1    Results for orders placed or performed during the hospital encounter of  05/03/21 (from the past 24 hour(s))  POC SARS Coronavirus 2 Ag-ED - Nasal Swab     Status: Normal   Collection Time: 05/03/21  2:17 PM  Result Value Ref Range   SARS Coronavirus 2 Ag Negative Negative  POCT Influenza A/B  Status: Normal   Collection Time: 05/03/21  2:17 PM  Result Value Ref Range   Influenza A, POC Negative Negative   Influenza B, POC Negative Negative   DG Chest 2 View  Result Date: 05/03/2021 CLINICAL DATA:  Cough, COPD, concern for pneumonia EXAM: CHEST - 2 VIEW COMPARISON:  Chest radiograph 04/09/2018 FINDINGS: The cardiomediastinal silhouette is normal. The central hilar vasculature is prominent, unchanged. There is no focal consolidation or pulmonary edema. There is no pleural effusion or pneumothorax. There is no acute osseous abnormality. IMPRESSION: No radiographic evidence of acute cardiopulmonary process. Electronically Signed   By: Valetta Mole M.D.   On: 05/03/2021 14:28    ED Clinical Impression  1. COPD exacerbation ([place])   2. Respiratory infection   3. Medication refill      ED Assessment/Plan  Concern for pneumonia versus COPD exacerbation.  Checking a rapid flu, COVID because today is the last day to initiate antivirals.  Also checking chest x-ray to rule out pneumonia.  Reviewed imaging independently.  No pneumonia.  See radiology report for full details.  Presentation consistent with a COPD exacerbation.  X-rays negative for pneumonia. will send home with regularly scheduled albuterol with a spacer, or she may take a nebulizer treatment.  Prednisone, discussed with her that this will elevate her sugars, Tessalon, and antibiotics because she is reporting increased sputum volume, purulence and shortness of breath.  However, feel that she is stable to attempt outpatient therapy.  Follow-up with PMD in 3 days, strict ER return precautions given.  Sending home on azithromycin Z-Pak.  She is allergic to fluoroquinolones, penicillins and  cephalosporins.  Discussed labs, imaging, MDM, treatment plan, and plan for follow-up with patient. Discussed sn/sx that should prompt return to the ED. patient agrees with plan.   Meds ordered this encounter  Medications   budesonide-formoterol (SYMBICORT) 160-4.5 MCG/ACT inhaler    Sig: Inhale 2 puffs into the lungs 2 (two) times daily. INHALE 2 PUFFS BY MOUTH TWICE A DAY    Dispense:  10.2 g    Refill:  0   albuterol (VENTOLIN HFA) 108 (90 Base) MCG/ACT inhaler    Sig: Inhale 1-2 puffs into the lungs every 4 (four) hours as needed for wheezing or shortness of breath.    Dispense:  1 each    Refill:  0   Spacer/Aero-Holding Chambers (AEROCHAMBER PLUS) inhaler    Sig: Use with inhaler    Dispense:  1 each    Refill:  2    Please educate patient on use   azithromycin (ZITHROMAX) 250 MG tablet    Sig: Take 1 tablet (250 mg total) by mouth daily. 2 tabs po on day 1, 1 tab po on days 2-5    Dispense:  6 tablet    Refill:  0   predniSONE (DELTASONE) 20 MG tablet    Sig: Take 2 tablets (40 mg total) by mouth daily with breakfast for 5 days.    Dispense:  10 tablet    Refill:  0   benzonatate (TESSALON) 200 MG capsule    Sig: Take 1 capsule (200 mg total) by mouth 3 (three) times daily as needed for cough.    Dispense:  30 capsule    Refill:  0      *This clinic note was created using [employer]. Therefore, there may be occasional mistakes despite careful proofreading.  ?    Melynda Ripple, MD 05/05/21 [phone]

## 2021-05-04 ENCOUNTER — Other Ambulatory Visit: Payer: Self-pay | Admitting: Family Medicine

## 2021-06-05 ENCOUNTER — Other Ambulatory Visit: Payer: Self-pay | Admitting: Family Medicine

## 2021-06-15 ENCOUNTER — Other Ambulatory Visit: Payer: Self-pay | Admitting: Family Medicine

## 2021-06-15 DIAGNOSIS — M797 Fibromyalgia: Secondary | ICD-10-CM

## 2021-06-22 ENCOUNTER — Encounter: Payer: Self-pay | Admitting: Family Medicine

## 2021-06-22 DIAGNOSIS — F17210 Nicotine dependence, cigarettes, uncomplicated: Secondary | ICD-10-CM | POA: Diagnosis not present

## 2021-06-22 DIAGNOSIS — B353 Tinea pedis: Secondary | ICD-10-CM | POA: Diagnosis not present

## 2021-06-22 DIAGNOSIS — K219 Gastro-esophageal reflux disease without esophagitis: Secondary | ICD-10-CM | POA: Diagnosis not present

## 2021-06-22 DIAGNOSIS — E1140 Type 2 diabetes mellitus with diabetic neuropathy, unspecified: Secondary | ICD-10-CM | POA: Diagnosis not present

## 2021-06-22 DIAGNOSIS — G629 Polyneuropathy, unspecified: Secondary | ICD-10-CM | POA: Diagnosis not present

## 2021-06-22 DIAGNOSIS — E785 Hyperlipidemia, unspecified: Secondary | ICD-10-CM | POA: Diagnosis not present

## 2021-06-22 DIAGNOSIS — E039 Hypothyroidism, unspecified: Secondary | ICD-10-CM | POA: Diagnosis not present

## 2021-06-22 DIAGNOSIS — I10 Essential (primary) hypertension: Secondary | ICD-10-CM | POA: Diagnosis not present

## 2021-06-22 DIAGNOSIS — Z122 Encounter for screening for malignant neoplasm of respiratory organs: Secondary | ICD-10-CM | POA: Diagnosis not present

## 2021-06-22 DIAGNOSIS — Z0000 Encounter for general adult medical examination without abnormal findings: Secondary | ICD-10-CM | POA: Diagnosis not present

## 2021-06-22 DIAGNOSIS — F17218 Nicotine dependence, cigarettes, with other nicotine-induced disorders: Secondary | ICD-10-CM | POA: Diagnosis not present

## 2021-06-22 DIAGNOSIS — E1159 Type 2 diabetes mellitus with other circulatory complications: Secondary | ICD-10-CM | POA: Diagnosis not present

## 2021-06-22 NOTE — Telephone Encounter (Signed)
Noted  

## 2021-07-05 DIAGNOSIS — Z780 Asymptomatic menopausal state: Secondary | ICD-10-CM | POA: Diagnosis not present

## 2021-07-05 DIAGNOSIS — Z13820 Encounter for screening for osteoporosis: Secondary | ICD-10-CM | POA: Diagnosis not present

## 2021-07-06 DIAGNOSIS — M1712 Unilateral primary osteoarthritis, left knee: Secondary | ICD-10-CM | POA: Diagnosis not present

## 2021-07-10 DIAGNOSIS — F17218 Nicotine dependence, cigarettes, with other nicotine-induced disorders: Secondary | ICD-10-CM | POA: Diagnosis not present

## 2021-07-10 DIAGNOSIS — Z87891 Personal history of nicotine dependence: Secondary | ICD-10-CM | POA: Diagnosis not present

## 2021-07-18 DIAGNOSIS — G4733 Obstructive sleep apnea (adult) (pediatric): Secondary | ICD-10-CM | POA: Diagnosis not present

## 2021-08-22 DIAGNOSIS — I10 Essential (primary) hypertension: Secondary | ICD-10-CM | POA: Diagnosis not present

## 2021-08-22 DIAGNOSIS — K582 Mixed irritable bowel syndrome: Secondary | ICD-10-CM | POA: Diagnosis not present

## 2021-08-22 DIAGNOSIS — K219 Gastro-esophageal reflux disease without esophagitis: Secondary | ICD-10-CM | POA: Diagnosis not present

## 2021-08-22 DIAGNOSIS — I7300 Raynaud's syndrome without gangrene: Secondary | ICD-10-CM | POA: Diagnosis not present

## 2021-08-22 DIAGNOSIS — R490 Dysphonia: Secondary | ICD-10-CM | POA: Diagnosis not present

## 2021-08-24 ENCOUNTER — Other Ambulatory Visit: Payer: Medicare Other

## 2021-10-31 ENCOUNTER — Ambulatory Visit: Payer: Medicare Other | Admitting: Pulmonary Disease

## 2022-01-29 NOTE — Progress Notes (Signed)
Cardiology Office Note:    Date:  01/30/2022   ID:  Susan [name], DOB [birth year], MRN [id number]  PCP:  Jettie Booze, NP  Cardiologist:  None   Referring MD: Jettie Booze, NP   Chief Complaint  Patient presents with   Shortness of Breath   Advice Only    Cardiomegaly noted as an incidental finding on a noncardiac MRI.    History of Present Illness:    Susan [name] is a 71 y.o. female with a hx referred for evaluation of Cardiomegaly identified on an MRI in June 2023 that was being done to characterize the presence or absence of lipoma presenting as an arm mass.   Referral was set up by Morene Crocker, NP, [facility] with concerned about cardiomegaly.  Suffering with chronic low back pain.  Therefore, exertional activities have been severely curtailed.  Now notices over the past 6 months that with physical activity she tires more easily and has shortness of breath.  She denies orthopnea, PND, and lower extremity swelling.  She also denies chest pain, wheezing, and weight gain.  She has on occasion palpitations that are not sustained.  Prior history of bilateral mastectomy for breast cancer, most recently in 2017.  Never received chemotherapy.  There is family history of CAD involving her mother, and a sister.  She continues to smoke cigarettes, is diabetic, has aortic atherosclerosis, COPD, hyperlipidemia, and obstructive sleep apnea on CPAP.  Past Medical History:  Diagnosis Date   Anxiety and depression    Atypical chest pain 2016   ruled out for MI, stress test showed no ischemia.  CT angio NO PE.   Cellulitis of chest wall 12/09/2015   s/p breast surgery   Colon cancer screening 05/11/2018   2007 colonoscopy normal.  Cologuard neg 05/11/18; repeat 3 yrs.   COPD (chronic obstructive pulmonary disease) (HCC)    mild; prn albut per Dr. Lamonte Sakai as of 2017--no f/u w/pulm since then.   DDD (degenerative disc disease)    Diabetes mellitus ([place])    w/DPN    Diabetic peripheral neuropathy (HCC)    DJD (degenerative joint disease)    Fatty liver disease, nonalcoholic    Fibromyalgia    cymbalta   GERD (gastroesophageal reflux disease)    History of blood clots 2004   during cancer treatment   History of breast cancer 2004   DCIS->lumpectomy + rad. Eventually go bilat mastectomy in 2017 (Sister had breast cancer in her 57s). Last seen by onc not long after surgery, no further tx or onc f/u needed.   History of latent tuberculosis    got approp tx x 9 mo   History of palpitations 2016   sinus tachycardia.  30 day event monitor was normal (Dr. Irish Lack)   History of pneumonia    HTN (hypertension) 01/28/2012   Hyperlipidemia 01/28/2012   Hypothyroidism 02/14/2014   Lipoma 01/05/2018   Mediastinal adenopathy 2016   on CT angio done for atypical CP.  Has FH of NHL in sister. Pulm saw her 01/2016 and recommended a f/u CT 09/2016 but this doesn't appear to have been done.  Rpt CT 02/2019->resolved.   Nasal polyp 08/30/2012   Obesity    OSA on CPAP    HOme sleep study 09/02/19-->CPAP (Dr. Ander Slade w/[place])   Osteoarthritis    L knee-"end stage"--Dr. Delfino Lovett   Osteopenia    Peripheral vascular disease ([place]) 04   axillary,upper chest after 3 weeks on tamoxifen  Sialoadenitis of submandibular gland 08/27/2018   LEFT. doxy rx'd by ED. ENT f/u the next day->reassured, dx'd with partial left submandibular gland obstruction w/out sign of infxn; f/u prn.   Tobacco abuse-unspec 10/12/2013    Past Surgical History:  Procedure Laterality Date   ABDOMINAL HYSTERECTOMY  1991   APPENDECTOMY  91   BILATERAL OOPHORECTOMY  01/2003   BREAST SURGERY Left 2004   lumpectomy (DCIS)+ radiation   CARPAL TUNNEL RELEASE     right   COLONOSCOPY  10/15/2005   Dr. Bronson Ing, poor prep   HOME SLEEP STUDY  09/02/2019   mod OSA->CPAP   INCISION AND DRAINAGE ABSCESS Left 12/13/2015   Procedure: DRAINAGE LEFT MASTECTOMY WOUND INFECTION;  Surgeon: Fanny Skates,  MD;  Location: [facility];  Service: General;  Laterality: Left;   MASTECTOMY W/ SENTINEL NODE BIOPSY Bilateral 10/26/2015   Procedure: RIGHT TOTAL MASTECTOMY WITH RIGHT SENTINEL LYMPH NODE BIOPSY, INJECT BLUE DYE RIGHT BREAST, LEFT BREAST PROPHYLACTIC MASTECTOMY;  Surgeon: Fanny Skates, MD;  Location: [facility];  Service: General;  Laterality: Bilateral;   NASAL SEPTUM SURGERY  08   PILONIDAL CYST EXCISION     THYROIDECTOMY, PARTIAL  mid 80's   right side   TOTAL KNEE ARTHROPLASTY Right    TRANSTHORACIC ECHOCARDIOGRAM  05/25/2008   ? mild increase in LV filling pressure, EF 65%, normal LV function/wall motion, no LVH or ventricular enlargement.    Current Medications: Current Meds  Medication Sig   acetaminophen (TYLENOL) 500 MG tablet Take 1 tablet by mouth as needed.   albuterol (PROVENTIL) (2.5 MG/3ML) 0.083% nebulizer solution Take 3 mLs (2.5 mg total) by nebulization every 6 (six) hours as needed for wheezing or shortness of breath.   albuterol (VENTOLIN HFA) 108 (90 Base) MCG/ACT inhaler Inhale 1-2 puffs into the lungs every 4 (four) hours as needed for wheezing or shortness of breath.   amitriptyline (ELAVIL) 25 MG tablet TAKE 1 TO 2 TABLETS BY  MOUTH AT BEDTIME   cholecalciferol (VITAMIN D) 1000 UNITS tablet Take 1,000 Units by mouth daily.   clonazePAM (KLONOPIN) 0.5 MG tablet Take 0.5 mg by mouth 2 (two) times daily as needed.   cyclobenzaprine (FLEXERIL) 10 MG tablet TAKE 1 TABLET BY MOUTH THREE TIMES A DAY AS NEEDED FOR MUSCLE SPASMS   diclofenac Sodium (VOLTAREN) 1 % GEL Voltaren 1 % topical gel  APPLY 2 GRAM TO THE AFFECTED AREA(S) BY TOPICAL ROUTE 2 or 3 TIMES PER DAY   dicyclomine (BENTYL) 10 MG capsule Take 1 capsule by mouth as needed.   DULoxetine (CYMBALTA) 30 MG capsule TAKE 3 CAPSULES BY MOUTH  DAILY   esomeprazole (NEXIUM) 40 MG capsule Take 40 mg by mouth daily at 12 noon.   hydrochlorothiazide (HYDRODIURIL) 25 MG tablet TAKE 1 TABLET BY MOUTH  DAILY   ibuprofen  (ADVIL,MOTRIN) 200 MG tablet Take 800 mg by mouth every 6 (six) hours as needed for headache or mild pain.    levothyroxine (SYNTHROID) 50 MCG tablet TAKE 1 TABLET BY MOUTH  DAILY BEFORE BREAKFAST   lisinopril (ZESTRIL) 10 MG tablet Take 1 tablet (10 mg total) by mouth daily. OFFICE VISIT NEEDED FOR FURTHER REFILLS   metFORMIN (GLUCOPHAGE) 1000 MG tablet TAKE 1 TABLET BY MOUTH  TWICE DAILY WITH MEALS   metoprolol tartrate (LOPRESSOR) 100 MG tablet TAKE 1 TABLET BY MOUTH  TWICE DAILY   Multiple Vitamins-Minerals (MULTIVITAMIN WITH MINERALS) tablet Take 1 tablet by mouth daily.   Omega-3 Fatty Acids (FISH OIL PO) Take 1,200 mg  by mouth daily at 12 noon.   pravastatin (PRAVACHOL) 40 MG tablet TAKE 1 TABLET BY MOUTH  DAILY   Spacer/Aero-Holding Chambers (AEROCHAMBER PLUS) inhaler Use with inhaler     Allergies:   Chantix [varenicline], Ciprofloxacin, Latex, Penicillins, Tamoxifen, Levaquin [levofloxacin], Lyrica [pregabalin], Neurontin [gabapentin], Morphine and related, Ceclor [cefaclor], Other, and Sulfa antibiotics   Social History   Socioeconomic History   Marital status: Married    Spouse name: Not on file   Number of children: Not on file   Years of education: Not on file   Highest education level: Not on file  Occupational History   Not on file  Tobacco Use   Smoking status: Some Days    Packs/day: 0.50    Years: 35.00    Total pack years: 17.50    Types: Cigarettes   Smokeless tobacco: Never  Vaping Use   Vaping Use: Never used  Substance and Sexual Activity   Alcohol use: No    Alcohol/week: 0.0 standard drinks of alcohol   Drug use: No   Sexual activity: Never  Other Topics Concern   Not on file  Social History Narrative   Married, 3 children.  Two have Beal's syndrome (connective tissue d/o similar to Marfan's).   Orig from [place].  [place] since 56s.   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   Tobacco  (current as of 08/2018).   No alcohol.   Social Determinants of Health   Financial Resource Strain: Low Risk  (01/27/2020)   Overall Financial Resource Strain (CARDIA)    Difficulty of Paying Living Expenses: Not hard at all  Food Insecurity: No Food Insecurity (01/27/2020)   Hunger Vital Sign    Worried About Running Out of Food in the Last Year: Never true    Ran Out of Food in the Last Year: Never true  Transportation Needs: No Transportation Needs (01/27/2020)   PRAPARE - Hydrologist (Medical): No    Lack of Transportation (Non-Medical): No  Physical Activity: Inactive (01/27/2020)   Exercise Vital Sign    Days of Exercise per Week: 0 days    Minutes of Exercise per Session: 0 min  Stress: No Stress Concern Present (01/27/2020)   [place]    Feeling of Stress : Not at all  Social Connections: [place] (01/27/2020)   Social Connection and Isolation Panel [NHANES]    Frequency of Communication with Friends and Family: More than three times a week    Frequency of Social Gatherings with Friends and Family: Once a week    Attends Religious Services: More than 4 times per year    Active Member of [employer] or Organizations: Yes    Attends Music therapist: More than 4 times per year    Marital Status: Married     Family History: The patient's family history includes Arthritis in her brother, daughter, and son; Benign prostatic hyperplasia in her brother; Breast cancer in her brother and sister; Breast cancer (age of onset: 5) in her maternal aunt; COPD in her brother and father; Cancer in her cousin and sister; Cancer (age of onset: 61) in her father; Cancer (age of onset: 14) in her mother; Cirrhosis in her brother; Colon cancer in her cousin; Colon cancer (age of onset: 70) in an other family member; Coronary artery disease in her maternal grandmother; Dementia in her  maternal grandmother; Diverticulitis in her sister; Emphysema in her paternal aunt; Gout in her sister; Heart Problems in her maternal grandmother; Heart disease in her mother and sister; Hyperlipidemia in her brother, mother, and sister; Hypertension in her brother, brother, brother, brother, mother, and sister; Kidney disease in her brother; Leukemia in her brother and cousin; Lung cancer in her cousin and paternal aunt; Melanoma in her brother and another family member; Mental illness in her brother; Myelodysplastic syndrome in her brother; Non-Hodgkin's lymphoma (age of onset: 4) in her sister; Osteoporosis in her mother; Other in her daughter, daughter, and sister; Prostate cancer in her brother, brother, and brother; Vision loss in her maternal grandmother and paternal grandfather.  ROS:   Please see the history of present illness.    When she gets a virus and also during pollen season she gets pulmonary congestion, cough, and lingering symptoms of bronchitis.  She had COVID-19 in 2020.  All other systems reviewed and are negative.  EKGs/Labs/Other Studies Reviewed:    The following studies were reviewed today: No prior cardiac studies  EKG:  EKG normal sinus rhythm with normal EKG appearance, heart rate 68 bpm.  Recent Labs: No results found for requested labs within last 365 days.  Recent Lipid Panel    Component Value Date/Time   CHOL 104 06/17/2020 0959   CHOL 115 09/03/2017 1100   TRIG 109.0 06/17/2020 0959   HDL 36.90 (L) 06/17/2020 0959   HDL 31 (L) 09/03/2017 1100   CHOLHDL 3 06/17/2020 0959   VLDL 21.8 06/17/2020 0959   LDLCALC 46 06/17/2020 0959   LDLCALC 45 09/03/2017 1100    Physical Exam:    VS:  BP 118/76   Pulse 68   Ht '5\' 4"'$  (1.626 m)   Wt 222 lb (100.7 kg)   SpO2 98%   BMI 38.11 kg/m     Wt Readings from Last 3 Encounters:  01/30/22 222 lb (100.7 kg)  05/03/21 228 lb (103.4 kg)  10/14/20 225 lb 3.2 oz (102.2 kg)     GEN: Morbid obesity. No acute  distress HEENT: Normal NECK: No JVD. LYMPHATICS: No lymphadenopathy CARDIAC: No murmur. RRR no gallop, or edema. VASCULAR:  Normal Pulses. No bruits. RESPIRATORY:  Clear to auscultation without rales, wheezing or rhonchi  ABDOMEN: Soft, non-tender, non-distended, No pulsatile mass, MUSCULOSKELETAL: No deformity  SKIN: Warm and dry NEUROLOGIC:  Alert and oriented x 3 PSYCHIATRIC:  Normal affect   ASSESSMENT:    1. Cardiomegaly   2. Shortness of breath   3. Pure hypercholesterolemia   4. Essential hypertension   5. OSA on CPAP   6. Controlled type 2 diabetes mellitus with diabetic polyneuropathy, without long-term current use of insulin ([place])   7. Chronic obstructive pulmonary disease with acute lower respiratory infection (HCC)    PLAN:    In order of problems listed above:  2D Doppler echocardiogram to interrogate structural integrity of the heart and to rule out pericardial disease in the setting of prior breast cancer.  Rule out significant diastolic dysfunction. Brain natruretic peptide.  Shortness of breath could be related to continued cigarette smoking.  Rule out diastolic heart failure. Continue statin therapy with LDL target less than 70. Target blood pressure 130/80 mmHg.  Continue HCTZ, metoprolol succinate, and lisinopril. Continue CPAP. If diastolic dysfunction is identified, addition of SGLT2 therapy would be very appropriate given symptoms of dyspnea on exertion. I encouraged her to discontinue cigarette smoking.    Medication Adjustments/Labs and Tests Ordered: Current medicines  are reviewed at length with the patient today.  Concerns regarding medicines are outlined above.  Orders Placed This Encounter  Procedures   Pro b natriuretic peptide (BNP)   EKG 12-Lead   ECHOCARDIOGRAM COMPLETE   No orders of the defined types were placed in this encounter.   Patient Instructions  Medication Instructions:  Your physician recommends that you continue on your  current medications as directed. Please refer to the Current Medication list given to you today.  *If you need a refill on your cardiac medications before your next appointment, please call your pharmacy*   Lab Work: BNP If you have labs (blood work) drawn today and your tests are completely normal, you will receive your results only by: [place] (if you have MyChart) OR A paper copy in the mail If you have any lab test that is abnormal or we need to change your treatment, we will call you to review the results.   Testing/Procedures: Your physician has requested that you have an echocardiogram. Echocardiography is a painless test that uses sound waves to create images of your heart. It provides your doctor with information about the size and shape of your heart and how well your heart's chambers and valves are working. This procedure takes approximately one hour. There are no restrictions for this procedure.    Follow-Up: At [facility], you and your health needs are our priority.  As part of our continuing mission to provide you with exceptional heart care, we have created designated Provider Care Teams.  These Care Teams include your primary Cardiologist (physician) and Advanced Practice Providers (APPs -  Physician Assistants and Nurse Practitioners) who all work together to provide you with the care you need, when you need it.  Your next appointment:   As needed  Provider:   Dr. Tamala Julian  Important Information About Sugar         Signed, Sinclair Grooms, MD  01/30/2022 11:58 AM    [place]

## 2022-01-30 ENCOUNTER — Encounter: Payer: Self-pay | Admitting: Interventional Cardiology

## 2022-01-30 ENCOUNTER — Ambulatory Visit: Payer: Medicare Other | Attending: Interventional Cardiology | Admitting: Interventional Cardiology

## 2022-01-30 VITALS — BP 118/76 | HR 68 | Ht 64.0 in | Wt 222.0 lb

## 2022-01-30 DIAGNOSIS — I10 Essential (primary) hypertension: Secondary | ICD-10-CM | POA: Diagnosis not present

## 2022-01-30 DIAGNOSIS — Z9989 Dependence on other enabling machines and devices: Secondary | ICD-10-CM

## 2022-01-30 DIAGNOSIS — R0602 Shortness of breath: Secondary | ICD-10-CM

## 2022-01-30 DIAGNOSIS — I517 Cardiomegaly: Secondary | ICD-10-CM | POA: Diagnosis not present

## 2022-01-30 DIAGNOSIS — E1142 Type 2 diabetes mellitus with diabetic polyneuropathy: Secondary | ICD-10-CM

## 2022-01-30 DIAGNOSIS — J440 Chronic obstructive pulmonary disease with acute lower respiratory infection: Secondary | ICD-10-CM

## 2022-01-30 DIAGNOSIS — E7800 Pure hypercholesterolemia, unspecified: Secondary | ICD-10-CM | POA: Diagnosis not present

## 2022-01-30 DIAGNOSIS — G4733 Obstructive sleep apnea (adult) (pediatric): Secondary | ICD-10-CM

## 2022-01-30 NOTE — Patient Instructions (Signed)
Medication Instructions:  Your physician recommends that you continue on your current medications as directed. Please refer to the Current Medication list given to you today.  *If you need a refill on your cardiac medications before your next appointment, please call your pharmacy*   Lab Work: BNP If you have labs (blood work) drawn today and your tests are completely normal, you will receive your results only by: [place] (if you have MyChart) OR A paper copy in the mail If you have any lab test that is abnormal or we need to change your treatment, we will call you to review the results.   Testing/Procedures: Your physician has requested that you have an echocardiogram. Echocardiography is a painless test that uses sound waves to create images of your heart. It provides your doctor with information about the size and shape of your heart and how well your heart's chambers and valves are working. This procedure takes approximately one hour. There are no restrictions for this procedure.    Follow-Up: At [facility], you and your health needs are our priority.  As part of our continuing mission to provide you with exceptional heart care, we have created designated Provider Care Teams.  These Care Teams include your primary Cardiologist (physician) and Advanced Practice Providers (APPs -  Physician Assistants and Nurse Practitioners) who all work together to provide you with the care you need, when you need it.  Your next appointment:   As needed  Provider:   Dr. Tamala Julian  Important Information About [place]

## 2022-01-31 LAB — PRO B NATRIURETIC PEPTIDE: NT-Pro BNP: 124 pg/mL (ref 0–301)

## 2022-02-16 ENCOUNTER — Ambulatory Visit (HOSPITAL_COMMUNITY): Payer: Medicare Other | Attending: Interventional Cardiology

## 2022-02-16 DIAGNOSIS — I517 Cardiomegaly: Secondary | ICD-10-CM

## 2022-02-16 LAB — ECHOCARDIOGRAM COMPLETE
AR max vel: 1.85 cm2
AV Area VTI: 1.83 cm2
AV Area mean vel: 1.82 cm2
AV Mean grad: 7 mmHg
AV Peak grad: 13.2 mmHg
Ao pk vel: 1.82 m/s
Area-P 1/2: 3.65 cm2
S' Lateral: 2.3 cm

## 2022-06-04 NOTE — Progress Notes (Deleted)
Office Visit Note  Patient: Susan [name]             Date of Birth: 07/23/1950           MRN: [id number]             PCP: Jettie Booze, NP Referring: Jettie Booze, NP Visit Date: 06/14/2022 Occupation: '@GUAROCC'$ @  Subjective:  No chief complaint on file.   History of Present Illness: Susan [name] is a 72 y.o. female ***     Activities of Daily Living:  Patient reports morning stiffness for *** {minute/hour:19697}.   Patient {ACTIONS;DENIES/REPORTS:21021675::"Denies"} nocturnal pain.  Difficulty dressing/grooming: {ACTIONS;DENIES/REPORTS:21021675::"Denies"} Difficulty climbing stairs: {ACTIONS;DENIES/REPORTS:21021675::"Denies"} Difficulty getting out of chair: {ACTIONS;DENIES/REPORTS:21021675::"Denies"} Difficulty using hands for taps, buttons, cutlery, and/or writing: {ACTIONS;DENIES/REPORTS:21021675::"Denies"}  No Rheumatology ROS completed.   PMFS History:  Patient Active Problem List   Diagnosis Date Noted   Osteoarthritis of left knee 11/17/2019   Trochanteric bursitis of left hip 05/22/2019   Anxiety and depression 01/05/2018   Osteoarthritis 01/05/2018   Fatty liver disease, nonalcoholic 12/45/8099   Induration, breast 01/05/2018   Left shoulder pain 01/05/2018   DDD (degenerative disc disease) 10/28/2017   GERD (gastroesophageal reflux disease) 10/28/2017   Fibromyalgia 10/28/2017   Obesity 10/28/2017   History of blood clots 10/28/2017   Cancer of central portion of right breast ([place]) 10/26/2015   Genetic testing 10/17/2015   History of breast cancer in female 09/15/2015   Breast cancer of upper-outer quadrant of right female breast ([place]) 09/15/2015   Family history of breast cancer in female 09/15/2015   Family history of colon cancer 09/15/2015   TB lung, latent 01/11/2015   COPD (chronic obstructive pulmonary disease) ([place]) 10/07/2014   Mediastinal lymphadenopathy 10/07/2014   Diabetes mellitus type 2, controlled ([place]) 09/29/2014    Pulmonary nodule 09/29/2014   History of chest pain 09/29/2014   Essential hypertension 09/29/2014   Chest pain 09/23/2014   Hypothyroidism 02/14/2014   Hip pain, right 10/12/2013   OSA on CPAP 05/31/2013   Cervical cancer screening 03/09/2013   Seasonal allergies 09/12/2012   SOM (serous otitis media) 08/30/2012   Peripheral neuropathy 07/19/2012   Cervical myofascial pain syndrome 06/08/2012   Hyperlipidemia 01/28/2012   Hyperglycemia 01/28/2012   Neck pain 12/30/2011   Muscle spasm 12/30/2011    Past Medical History:  Diagnosis Date   Anxiety and depression    Atypical chest pain 2016   ruled out for MI, stress test showed no ischemia.  CT angio NO PE.   Cellulitis of chest wall 12/09/2015   s/p breast surgery   Colon cancer screening 05/11/2018   2007 colonoscopy normal.  Cologuard neg 05/11/18; repeat 3 yrs.   COPD (chronic obstructive pulmonary disease) (HCC)    mild; prn albut per Dr. Lamonte Sakai as of 2017--no f/u w/pulm since then.   DDD (degenerative disc disease)    Diabetes mellitus ([place])    w/DPN   Diabetic peripheral neuropathy (HCC)    DJD (degenerative joint disease)    Fatty liver disease, nonalcoholic    Fibromyalgia    cymbalta   GERD (gastroesophageal reflux disease)    History of blood clots 2004   during cancer treatment   History of breast cancer 2004   DCIS->lumpectomy + rad. Eventually go bilat mastectomy in 2017 (Sister had breast cancer in her 22s). Last seen by onc not long after surgery, no further tx or onc f/u needed.   History of latent tuberculosis    got  approp tx x 9 mo   History of palpitations 2016   sinus tachycardia.  30 day event monitor was normal (Dr. Irish Lack)   History of pneumonia    HTN (hypertension) 01/28/2012   Hyperlipidemia 01/28/2012   Hypothyroidism 02/14/2014   Lipoma 01/05/2018   Mediastinal adenopathy 2016   on CT angio done for atypical CP.  Has FH of NHL in sister. Pulm saw her 01/2016 and recommended a f/u CT 09/2016  but this doesn't appear to have been done.  Rpt CT 02/2019->resolved.   Nasal polyp 08/30/2012   Obesity    OSA on CPAP    HOme sleep study 09/02/19-->CPAP (Dr. Ander Slade w/[facility])   Osteoarthritis    L knee-"end stage"--Dr. Delfino Lovett   Osteopenia    Peripheral vascular disease ([place]) 04   axillary,upper chest after 3 weeks on tamoxifen   Sialoadenitis of submandibular gland 08/27/2018   LEFT. doxy rx'd by ED. ENT f/u the next day->reassured, dx'd with partial left submandibular gland obstruction w/out sign of infxn; f/u prn.   Tobacco abuse-unspec 10/12/2013    Family History  Problem Relation Age of Onset   Cancer Mother 62       liver cancer, hep c   Heart disease Mother        chf   Hypertension Mother    Hyperlipidemia Mother    Osteoporosis Mother    Cancer Father 30       lung; smoker   COPD Father    Heart disease Sister        mvp   Breast cancer Sister        dx. 55s   Non-Hodgkin's lymphoma Sister 46       large B cell   Cancer Sister    Hypertension Sister    Other Sister        thalessemia, anemia; hx of hysterectomy in her 45s   Hyperlipidemia Sister    Gout Sister    Diverticulitis Sister    Hypertension Brother    Arthritis Brother    Kidney disease Brother    Prostate cancer Brother        dx. early 35s   Hypertension Brother    Benign prostatic hyperplasia Brother    Leukemia Brother        chronic lymphatic leukemia - small/B cell   Hypertension Brother    COPD Brother    Prostate cancer Brother        dx. mid-60s   Myelodysplastic syndrome Brother        dx. early 50s   Hypertension Brother    Hyperlipidemia Brother    Prostate cancer Brother        dx. mid-70s   Melanoma Brother        (x2) melanomas dx. late 60s-early 70s   Mental illness Brother        schizophrenia   Breast cancer Brother        dx. 1s   Cirrhosis Brother        hep c   Vision loss Maternal Grandmother    Dementia Maternal Grandmother    Coronary artery disease  Maternal Grandmother    Heart Problems Maternal Grandmother    Vision loss Paternal Grandfather    Other Daughter        Beal's Syndrome   Arthritis Daughter        Beal's   Other Daughter        overweight   Arthritis Son  Beal's connective tissue disease   Breast cancer Maternal Aunt 46   Emphysema Paternal Aunt    Lung cancer Paternal Aunt        d. early 13s; smoker   Lung cancer Cousin        maternal 1st cousin dx. 27 or younger; former smoker   Colon cancer Cousin        maternal 1st cousin dx. late 50s-early 68s   Cancer Cousin        maternal 1st cousin d. NOS cancer   Leukemia Cousin        paternal 1st cousin d. early 93s   Colon cancer Other 17       niece   Melanoma Other        niece   Past Surgical History:  Procedure Laterality Date   ABDOMINAL HYSTERECTOMY  1991   APPENDECTOMY  14   BILATERAL OOPHORECTOMY  01/2003   BREAST SURGERY Left 2004   lumpectomy (DCIS)+ radiation   CARPAL TUNNEL RELEASE     right   COLONOSCOPY  10/15/2005   Dr. Bronson Ing, poor prep   HOME SLEEP STUDY  09/02/2019   mod OSA->CPAP   INCISION AND DRAINAGE ABSCESS Left 12/13/2015   Procedure: DRAINAGE LEFT MASTECTOMY WOUND INFECTION;  Surgeon: Fanny Skates, MD;  Location: [facility];  Service: General;  Laterality: Left;   MASTECTOMY W/ SENTINEL NODE BIOPSY Bilateral 10/26/2015   Procedure: RIGHT TOTAL MASTECTOMY WITH RIGHT SENTINEL LYMPH NODE BIOPSY, INJECT BLUE DYE RIGHT BREAST, LEFT BREAST PROPHYLACTIC MASTECTOMY;  Surgeon: Fanny Skates, MD;  Location: [facility];  Service: General;  Laterality: Bilateral;   NASAL SEPTUM SURGERY  08   PILONIDAL CYST EXCISION     THYROIDECTOMY, PARTIAL  mid 80's   right side   TOTAL KNEE ARTHROPLASTY Right    TRANSTHORACIC ECHOCARDIOGRAM  05/25/2008   ? mild increase in LV filling pressure, EF 65%, normal LV function/wall motion, no LVH or ventricular enlargement.   Social History   Social History Narrative   Married, 3 children.  Two  have Beal's syndrome (connective tissue d/o similar to Marfan's).   Orig from [place].  [place] since 89s.   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   Tobacco (current as of 08/2018).   No alcohol.   Immunization History  Administered Date(s) Administered   Fluad Quad(high Dose 65+) 02/05/2020   Influenza Split 01/28/2012   Influenza, High Dose Seasonal PF 02/25/2018   Influenza, Quadrivalent, Recombinant, Inj, Pf 02/10/2019   Influenza,inj,Quad PF,6+ Mos 01/09/2013, 02/12/2014, 04/12/2015, 01/25/2016, 01/24/2017   PFIZER(Purple Top)SARS-COV-2 Vaccination 07/06/2019, 07/27/2019, 02/05/2020   Pfizer Covid-19 Vaccine Bivalent Booster 43yr & up 04/20/2021   Pneumococcal Conjugate-13 03/09/2013   Pneumococcal Polysaccharide-23 09/29/2014, 03/16/2020   Tdap 12/26/2012   Zoster, Live 12/20/2014     Objective: Vital Signs: There were no vitals taken for this visit.   Physical Exam   Musculoskeletal Exam: ***  CDAI Exam: CDAI Score: -- Patient Global: --; Provider Global: -- Swollen: --; Tender: -- Joint Exam 06/14/2022   No joint exam has been documented for this visit   There is currently no information documented on the homunculus. Go to the Rheumatology activity and complete the homunculus joint exam.  Investigation: No additional findings.  Imaging: No results found.  Recent Labs: Lab Results  Component Value Date   WBC 6.0 06/17/2020   HGB 12.4 06/17/2020   PLT 247.0 06/17/2020   NA 137  06/17/2020   K 3.6 06/17/2020   CL 95 (L) 06/17/2020   CO2 31 06/17/2020   GLUCOSE 84 06/17/2020   BUN 19 06/17/2020   CREATININE 0.94 06/17/2020   BILITOT 0.5 06/17/2020   ALKPHOS 60 06/17/2020   AST 15 06/17/2020   ALT 25 06/17/2020   PROT 6.8 06/17/2020   ALBUMIN 4.4 06/17/2020   CALCIUM 9.2 06/17/2020   GFRAA >60 08/26/2018   QFTBGOLD Positive (A) 10/07/2014    Speciality Comments: No specialty comments  available.  Procedures:  No procedures performed Allergies: Chantix [varenicline], Ciprofloxacin, Latex, Penicillins, Tamoxifen, Levaquin [levofloxacin], Lyrica [pregabalin], Neurontin [gabapentin], Morphine and related, Ceclor [cefaclor], Other, and Sulfa antibiotics   Assessment / Plan:     Visit Diagnoses: Fibromyalgia  Polyneuropathy associated with underlying disease ([place])  Cervical myofascial pain syndrome  Primary osteoarthritis involving multiple joints  Primary osteoarthritis of left knee  Trochanteric bursitis of left hip  Mediastinal lymphadenopathy  Cancer of central portion of right breast (HCC)  OSA on CPAP  Chronic obstructive pulmonary disease with acute lower respiratory infection ([place])  Pulmonary nodule  TB lung, latent  Essential hypertension  Fatty liver disease, nonalcoholic  History of gastroesophageal reflux (GERD)  Controlled type 2 diabetes mellitus with diabetic polyneuropathy, without long-term current use of insulin (HCC)  Acquired hypothyroidism  Anxiety and depression  Family history of breast cancer in female  Family history of colon cancer  History of blood clots  History of hyperlipidemia  Orders: No orders of the defined types were placed in this encounter.  No orders of the defined types were placed in this encounter.   Face-to-face time spent with patient was *** minutes. Greater than 50% of time was spent in counseling and coordination of care.  Follow-Up Instructions: No follow-ups on file.   Ofilia Neas, PA-C  Note - This record has been created using Dragon software.  Chart creation errors have been sought, but may not always  have been located. Such creation errors do not reflect on  the standard of medical care.

## 2022-06-07 ENCOUNTER — Inpatient Hospital Stay (HOSPITAL_BASED_OUTPATIENT_CLINIC_OR_DEPARTMENT_OTHER): Payer: Medicare Other | Admitting: Medical Oncology

## 2022-06-07 ENCOUNTER — Inpatient Hospital Stay: Payer: Medicare Other | Attending: Hematology & Oncology

## 2022-06-07 ENCOUNTER — Encounter: Payer: Self-pay | Admitting: Medical Oncology

## 2022-06-07 VITALS — BP 123/53 | HR 72 | Temp 98.1°F | Resp 20 | Ht 63.0 in | Wt 223.0 lb

## 2022-06-07 DIAGNOSIS — E119 Type 2 diabetes mellitus without complications: Secondary | ICD-10-CM | POA: Insufficient documentation

## 2022-06-07 DIAGNOSIS — Z86000 Personal history of in-situ neoplasm of breast: Secondary | ICD-10-CM

## 2022-06-07 DIAGNOSIS — E538 Deficiency of other specified B group vitamins: Secondary | ICD-10-CM | POA: Diagnosis not present

## 2022-06-07 DIAGNOSIS — D509 Iron deficiency anemia, unspecified: Secondary | ICD-10-CM

## 2022-06-07 DIAGNOSIS — Z9013 Acquired absence of bilateral breasts and nipples: Secondary | ICD-10-CM | POA: Insufficient documentation

## 2022-06-07 DIAGNOSIS — I10 Essential (primary) hypertension: Secondary | ICD-10-CM | POA: Insufficient documentation

## 2022-06-07 DIAGNOSIS — F17210 Nicotine dependence, cigarettes, uncomplicated: Secondary | ICD-10-CM | POA: Insufficient documentation

## 2022-06-07 DIAGNOSIS — D649 Anemia, unspecified: Secondary | ICD-10-CM

## 2022-06-07 LAB — IRON AND IRON BINDING CAPACITY (CC-WL,HP ONLY)
Iron: 52 ug/dL (ref 28–170)
Saturation Ratios: 12 % (ref 10.4–31.8)
TIBC: 454 ug/dL — ABNORMAL HIGH (ref 250–450)
UIBC: 402 ug/dL (ref 148–442)

## 2022-06-07 LAB — CBC WITH DIFFERENTIAL/PLATELET
Abs Immature Granulocytes: 0.02 10*3/uL (ref 0.00–0.07)
Basophils Absolute: 0 10*3/uL (ref 0.0–0.1)
Basophils Relative: 1 %
Eosinophils Absolute: 0.3 10*3/uL (ref 0.0–0.5)
Eosinophils Relative: 5 %
HCT: 34.5 % — ABNORMAL LOW (ref 36.0–46.0)
Hemoglobin: 10.9 g/dL — ABNORMAL LOW (ref 12.0–15.0)
Immature Granulocytes: 0 %
Lymphocytes Relative: 50 %
Lymphs Abs: 3.4 10*3/uL (ref 0.7–4.0)
MCH: 25.8 pg — ABNORMAL LOW (ref 26.0–34.0)
MCHC: 31.6 g/dL (ref 30.0–36.0)
MCV: 81.8 fL (ref 80.0–100.0)
Monocytes Absolute: 0.5 10*3/uL (ref 0.1–1.0)
Monocytes Relative: 7 %
Neutro Abs: 2.4 10*3/uL (ref 1.7–7.7)
Neutrophils Relative %: 37 %
Platelets: 290 10*3/uL (ref 150–400)
RBC: 4.22 MIL/uL (ref 3.87–5.11)
RDW: 15.3 % (ref 11.5–15.5)
WBC: 6.6 10*3/uL (ref 4.0–10.5)
nRBC: 0 % (ref 0.0–0.2)

## 2022-06-07 LAB — LACTATE DEHYDROGENASE: LDH: 133 U/L (ref 98–192)

## 2022-06-07 LAB — CMP (CANCER CENTER ONLY)
ALT: 21 U/L (ref 0–44)
AST: 16 U/L (ref 15–41)
Albumin: 4.4 g/dL (ref 3.5–5.0)
Alkaline Phosphatase: 50 U/L (ref 38–126)
Anion gap: 12 (ref 5–15)
BUN: 12 mg/dL (ref 8–23)
CO2: 26 mmol/L (ref 22–32)
Calcium: 9.6 mg/dL (ref 8.9–10.3)
Chloride: 97 mmol/L — ABNORMAL LOW (ref 98–111)
Creatinine: 0.93 mg/dL (ref 0.44–1.00)
GFR, Estimated: >60 mL/min (ref >60)
Glucose, Bld: 104 mg/dL — ABNORMAL HIGH (ref 70–99)
Potassium: 3.9 mmol/L (ref 3.5–5.1)
Sodium: 135 mmol/L (ref 135–145)
Total Bilirubin: 0.3 mg/dL (ref 0.3–1.2)
Total Protein: 7 g/dL (ref 6.5–8.1)

## 2022-06-07 LAB — FERRITIN: Ferritin: 8 ng/mL — ABNORMAL LOW (ref 11–307)

## 2022-06-07 LAB — RETICULOCYTES
Immature Retic Fract: 16.2 % — ABNORMAL HIGH (ref 2.3–15.9)
RBC.: 4.26 MIL/uL (ref 3.87–5.11)
Retic Count, Absolute: 66.9 10*3/uL (ref 19.0–186.0)
Retic Ct Pct: 1.6 % (ref 0.4–3.1)

## 2022-06-07 LAB — VITAMIN B12: Vitamin B-12: 354 pg/mL (ref 180–914)

## 2022-06-07 LAB — FOLATE: Folate: 29.9 ng/mL (ref >5.9)

## 2022-06-07 LAB — SAVE SMEAR(SSMR), FOR PROVIDER SLIDE REVIEW

## 2022-06-07 NOTE — Progress Notes (Signed)
[place] [phone]   Fax:([phone]) [place] NOTE  Patient Care Team: Susan Booze, NP as PCP - General (Family Medicine) Susan Skates, MD as Consulting Physician (General Surgery) Susan Lose, MD as Consulting Physician (Hematology and Oncology) Susan Gobble, MD as Consulting Physician (Pulmonary Disease) Susan Nunnery, MD (Inactive) as Consulting Physician (Otolaryngology) Susan Riding, MD as Consulting Physician (Ophthalmology) Susan Coder, MD as Consulting Physician (Pulmonary Disease)  Hematological/Oncological History # Formerly followed by Dr. Lindi [name] for history of breast cancer 2004-(Left breast DCIS ER/PR positive) clinical and pathological stage 0. On Tamoxifen briefly but developed blood clots [name] this was discontinued. S/p bilateral mastectomy. She thinks that she started having anemia about 3-4 months ago because she began to feel unusually tired and fatigued.   CHIEF COMPLAINTS/PURPOSE OF CONSULTATION:  "Anemia "  HISTORY OF PRESENTING ILLNESS:  Susan [name] 72 y.o. female with a complex medical history:  Blood in stools:No Blood in urine: No Difficulty swallowing: No Pica: Yes SOB: Mild Fatigue: Yes Change of bowel movement/constipation: Has IBS [name] hard to say Prior blood transfusion: Yes after miscarriage in roughly 1982.  Prior history of blood loss: Yes see above Liver disease: Fatty Liver Disease non alcoholic  Bariatric surgery: No B12 Def: Yes. Oral supplementation since Jan 10th.  Smoking about 5 cigarettes daily. Had a colonoscopy last year which has 6 benign polyps removed. Takes ibuprofen daily. Has distant history of h. Pylori.   Vaginal bleeding: No- S/p hysterectomy  Prior evaluation with hematology: No Prior bone marrow biopsy: No. Had a bone scan on 09/21/2021 at [facility] which showed minimal uptake in the lower lumbar spine that looked to be degenerative in nature.   Oral iron: On oral iron since Jan 10th. Now tolerating it well.  Prior IV iron infusions: No Family history Anemia: Yes-thalassemia but she has had testing and was negative No Personal or family history of bleeding or clotting disorders.  Not on any special diets No upcoming planned pregnancies and not currently pregnant   MEDICAL HISTORY:  Past Medical History:  Diagnosis Date   Anxiety and depression    Atypical chest pain 2016   ruled out for MI, stress test showed no ischemia.  CT angio NO PE.   Cellulitis of chest wall 12/09/2015   s/p breast surgery   Colon cancer screening 05/11/2018   2007 colonoscopy normal.  Cologuard neg 05/11/18; repeat 3 yrs.   COPD (chronic obstructive pulmonary disease) (HCC)    mild; prn albut per Susan [name] as of 2017--no f/u w/[name] since then.   DDD (degenerative disc disease)    Diabetes mellitus ([place])    w/DPN   Diabetic peripheral neuropathy (HCC)    DJD (degenerative joint disease)    Fatty liver disease, nonalcoholic    Fibromyalgia    cymbalta   GERD (gastroesophageal reflux disease)    History of blood clots 2004   during cancer treatment   History of breast cancer 2004   DCIS->lumpectomy + rad. Eventually go bilat mastectomy in 2017 (Sister had breast cancer in her 19s). Last seen by onc not long after surgery, no further tx or onc f/u needed.   History of latent tuberculosis    got approp tx x 9 mo   History of palpitations 2016   sinus tachycardia.  30 day event monitor was normal (Dr. Irish [name])   History of pneumonia    HTN (hypertension) 01/28/2012   Hyperlipidemia 01/28/2012   Hypothyroidism  02/14/2014   Lipoma 01/05/2018   Mediastinal adenopathy 2016   on CT angio done for atypical CP.  Has FH of NHL in sister. [name] saw her 01/2016 and recommended a f/u CT 09/2016 but this doesn't appear to have been done.  Rpt CT 02/2019->resolved.   Nasal polyp 08/30/2012   Obesity    OSA on CPAP    HOme sleep study 09/02/19-->CPAP (Dr. Ander [name]  w/Susan [name])   Osteoarthritis    L knee-"end stage"--Dr. Delfino [name]   Osteopenia    Peripheral vascular disease ([place]) 04   axillary,upper chest after 3 weeks on tamoxifen   Sialoadenitis of submandibular gland 08/27/2018   LEFT. Susan [name]. ENT f/u the next day->reassured, dx'd with partial left submandibular gland obstruction w/out sign of infxn; f/u prn.   Tobacco abuse-unspec 10/12/2013    SURGICAL HISTORY: Past Surgical History:  Procedure Laterality Date   ABDOMINAL HYSTERECTOMY  1991   APPENDECTOMY  91   BILATERAL OOPHORECTOMY  01/2003   BREAST SURGERY Left 2004   lumpectomy (DCIS)+ radiation   CARPAL TUNNEL RELEASE     right   COLONOSCOPY  10/15/2005   Dr. Bronson [name], poor prep   HOME SLEEP STUDY  09/02/2019   mod OSA->CPAP   INCISION AND DRAINAGE ABSCESS Left 12/13/2015   Procedure: DRAINAGE LEFT MASTECTOMY WOUND INFECTION;  Surgeon: Susan Skates, MD;  Location: [facility];  Service: General;  Laterality: Left;   MASTECTOMY W/ SENTINEL NODE BIOPSY Bilateral 10/26/2015   Procedure: RIGHT TOTAL MASTECTOMY WITH RIGHT SENTINEL LYMPH NODE BIOPSY, INJECT BLUE DYE RIGHT BREAST, LEFT BREAST PROPHYLACTIC MASTECTOMY;  Surgeon: Susan Skates, MD;  Location: [facility];  Service: General;  Laterality: Bilateral;   NASAL SEPTUM SURGERY  08   PILONIDAL CYST EXCISION     THYROIDECTOMY, PARTIAL  mid 80's   right side   TOTAL KNEE ARTHROPLASTY Right    TRANSTHORACIC ECHOCARDIOGRAM  05/25/2008   ? mild increase in LV filling pressure, EF 65%, normal LV function/wall motion, no LVH or ventricular enlargement.    SOCIAL HISTORY: Social History   Socioeconomic History   Marital status: Married    Spouse name: Not on file   Number of children: Not on file   Years of education: Not on file   Highest education level: Not on file  Occupational History   Not on file  Tobacco Use   Smoking status: Some Days    Packs/day: 0.50    Years: 35.00    Total pack years: 17.50    Types:  Cigarettes   Smokeless tobacco: Never  Vaping Use   Vaping Use: Never used  Substance and Sexual Activity   Alcohol use: No    Alcohol/week: 0.0 standard drinks of alcohol   Drug use: No   Sexual activity: Never  Other Topics Concern   Not on file  Social History Narrative   Married, 3 children.  Two have Beal's syndrome (connective tissue d/o similar to Marfan's).   Orig from [place].  [place] since 7s.   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   Tobacco (current as of 08/2018).   No alcohol.   Social Determinants of Health   Financial Resource Strain: Low Risk  (01/27/2020)   Overall Financial Resource Strain (CARDIA)    Difficulty of Paying Living Expenses: Not hard at all  Food Insecurity: No Food Insecurity (01/27/2020)   Hunger Vital Sign    Worried About Running Out of  Food in the Last Year: Never true    [place] in the Last Year: Never true  Transportation Needs: No Transportation Needs (01/27/2020)   PRAPARE - Hydrologist (Medical): No    [name] of Transportation (Non-Medical): No  Physical Activity: Inactive (01/27/2020)   Exercise Vital Sign    Days of Exercise per Week: 0 days    Minutes of Exercise per Session: 0 min  Stress: No Stress Concern Present (01/27/2020)   [place]    Feeling of Stress : Not at all  Social Connections: [place] (01/27/2020)   Social Connection and Isolation Panel [NHANES]    Frequency of Communication with Friends and Family: More than three times a week    Frequency of Social Gatherings with Friends and Family: Once a week    Attends Religious Services: More than 4 times per year    Active Member of [employer] or Organizations: Yes    Attends Music therapist: More than 4 times per year    Marital Status: Married  Human resources officer Violence: Not At Risk  (01/27/2020)   Humiliation, Afraid, Rape, and Kick questionnaire    Fear of Current or Ex-Partner: No    Emotionally Abused: No    Physically Abused: No    Sexually Abused: No    FAMILY HISTORY: Family History  Problem Relation Age of Onset   Cancer Mother 26       liver cancer, hep c   Heart disease Mother        chf   Hypertension Mother    Hyperlipidemia Mother    Osteoporosis Mother    Cancer Father 34       lung; smoker   COPD Father    Heart disease Sister        mvp   Breast cancer Sister        dx. 83s   Non-Hodgkin's lymphoma Sister 70       large B cell   Cancer Sister    Hypertension Sister    Other Sister        thalessemia, anemia; hx of hysterectomy in her 45s   Hyperlipidemia Sister    Gout Sister    Diverticulitis Sister    Hypertension Brother    Arthritis Brother    Kidney disease Brother    Prostate cancer Brother        dx. early 72s   Hypertension Brother    Benign prostatic hyperplasia Brother    Leukemia Brother        chronic lymphatic leukemia - small/B cell   Hypertension Brother    COPD Brother    Prostate cancer Brother        dx. mid-60s   Myelodysplastic syndrome Brother        dx. early 47s   Hypertension Brother    Hyperlipidemia Brother    Prostate cancer Brother        dx. mid-70s   Melanoma Brother        (x2) melanomas dx. late 60s-early 70s   Mental illness Brother        schizophrenia   Breast cancer Brother        dx. 78s   Cirrhosis Brother        hep c   Vision loss Maternal Grandmother    Dementia Maternal Grandmother    Coronary artery disease Maternal Grandmother    Heart  Problems Maternal Grandmother    Vision loss Paternal Grandfather    Other Daughter        Beal's Syndrome   Arthritis Daughter        Beal's   Other Daughter        overweight   Arthritis Son        Beal's connective tissue disease   Breast cancer Maternal Aunt 46   Emphysema Paternal Aunt    Lung cancer Paternal Aunt        d.  early 70s; smoker   Lung cancer Cousin        maternal 1st cousin dx. 53 or younger; former smoker   Colon cancer Cousin        maternal 1st cousin dx. late 50s-early 55s   Cancer Cousin        maternal 1st cousin d. NOS cancer   Leukemia Cousin        paternal 1st cousin d. early 75s   Colon cancer Other 74       niece   Melanoma Other        niece    ALLERGIES:  is allergic to chantix [varenicline], ciprofloxacin, latex, penicillins, tamoxifen, levaquin [levofloxacin], lyrica [pregabalin], neurontin [gabapentin], morphine and related, ceclor [cefaclor], other, and sulfa antibiotics.  MEDICATIONS:  Current Outpatient Medications  Medication Sig Dispense Refill   acetaminophen (TYLENOL) 500 MG tablet Take 1 tablet by mouth as needed.     albuterol (PROVENTIL) (2.5 MG/3ML) 0.083% nebulizer solution Take 3 mLs (2.5 mg total) by nebulization every 6 (six) hours as needed for wheezing or shortness of breath. 150 mL 1   albuterol (VENTOLIN HFA) 108 (90 Base) MCG/ACT inhaler Inhale 1-2 puffs into the lungs every 4 (four) hours as needed for wheezing or shortness of breath. 1 each 0   amitriptyline (ELAVIL) 25 MG tablet TAKE 1 TO 2 TABLETS BY  MOUTH AT BEDTIME 180 tablet 3   Ascorbic Acid (VITAMIN C) 1000 MG tablet Take 1,000 mg by mouth daily.     cholecalciferol (VITAMIN D) 1000 UNITS tablet Take 1,000 Units by mouth daily.     clonazePAM (KLONOPIN) 0.5 MG tablet Take 0.5 mg by mouth 2 (two) times daily as needed.     cyclobenzaprine (FLEXERIL) 10 MG tablet TAKE 1 TABLET BY MOUTH THREE TIMES A DAY AS NEEDED FOR MUSCLE SPASMS 60 tablet 1   diclofenac Sodium (VOLTAREN) 1 % GEL Voltaren 1 % topical gel  APPLY 2 GRAM TO THE AFFECTED AREA(S) BY TOPICAL ROUTE 2 or 3 TIMES PER DAY     dicyclomine (BENTYL) 10 MG capsule Take 1 capsule by mouth as needed.     DULoxetine (CYMBALTA) 30 MG capsule TAKE 3 CAPSULES BY MOUTH  DAILY 270 capsule 0   esomeprazole (NEXIUM) 40 MG capsule Take 40 mg by mouth  daily at 12 noon.     ferrous sulfate 325 (65 FE) MG tablet Take 325 mg by mouth daily with breakfast.     hydrochlorothiazide (HYDRODIURIL) 25 MG tablet TAKE 1 TABLET BY MOUTH  DAILY 90 tablet 0   ibuprofen (ADVIL,MOTRIN) 200 MG tablet Take 800 mg by mouth every 6 (six) hours as needed for headache or mild pain.      levothyroxine (SYNTHROID) 50 MCG tablet TAKE 1 TABLET BY MOUTH  DAILY BEFORE BREAKFAST 90 tablet 0   lisinopril (ZESTRIL) 10 MG tablet Take 1 tablet (10 mg total) by mouth daily. OFFICE VISIT NEEDED FOR FURTHER REFILLS 30 tablet 0  metFORMIN (GLUCOPHAGE) 1000 MG tablet TAKE 1 TABLET BY MOUTH  TWICE DAILY WITH MEALS 180 tablet 0   METHYLCOBALAMIN PO Take 2,000 mcg by mouth daily.     metoprolol tartrate (LOPRESSOR) 100 MG tablet TAKE 1 TABLET BY MOUTH  TWICE DAILY 180 tablet 0   Multiple Vitamins-Minerals (WOMENS 50+ MULTI VITAMIN PO) Take by mouth. Centrum Womens 50+ gummies - 2 a day     Omega-3 Fatty Acids (FISH OIL PO) Take 2,000 mg by mouth daily at 12 noon.     OZEMPIC, 0.25 OR 0.5 MG/DOSE, 2 MG/3ML SOPN Inject 1 Application into the skin once a week.     pravastatin (PRAVACHOL) 40 MG tablet TAKE 1 TABLET BY MOUTH  DAILY 90 tablet 1   PSYLLIUM HUSK PO Take 5 tablets by mouth daily at 6 (six) AM.     Spacer/Aero-Holding Chambers (AEROCHAMBER PLUS) inhaler Use with inhaler 1 each 2   Multiple Vitamins-Minerals (MULTIVITAMIN WITH MINERALS) tablet Take 1 tablet by mouth daily. (Patient not taking: Reported on 06/07/2022)     No current facility-administered medications for this visit.    REVIEW OF SYSTEMS:   Constitutional: ( - ) fevers, ( - )  chills , ( - ) night sweats Eyes: ( - ) blurriness of vision, ( - ) double vision, ( - ) watery eyes Ears, nose, mouth, throat, and face: ( - ) mucositis, ( - ) sore throat Respiratory: ( - ) cough, ( - ) dyspnea, ( - ) wheezes Cardiovascular: ( - ) palpitation, ( - ) chest discomfort, ( - ) lower extremity swelling Gastrointestinal:  (  - ) nausea, ( - ) heartburn, ( - ) change in bowel habits Skin: ( - ) abnormal skin rashes Lymphatics: ( - ) new lymphadenopathy, ( - ) easy bruising Neurological: ( - ) numbness, ( - ) tingling, ( - ) new weaknesses Behavioral/Psych: ( - ) mood change, ( - ) new changes  All other systems were reviewed with the patient and are negative.  PHYSICAL EXAMINATION: ECOG PERFORMANCE STATUS: 1 - Symptomatic but completely ambulatory  Vitals:   06/07/22 1327  BP: (!) 123/53  Pulse: 72  Resp: 20  Temp: 98.1 F (36.7 C)  SpO2: 100%   Filed Weights   06/07/22 1327  Weight: 223 lb (101.2 kg)    GENERAL: well appearing female with increased body habitus in NAD  SKIN: skin color, texture, turgor are normal, no rashes or significant lesions EYES: conjunctiva are pink and non-injected, sclera clear OROPHARYNX: no exudate, no erythema; lips, buccal mucosa, and tongue normal  NECK: supple, non-tender LYMPH:  no palpable lymphadenopathy in the cervical, axillary or supraclavicular lymph nodes.  LUNGS: clear to auscultation and percussion with normal breathing effort HEART: regular rate & rhythm and no murmurs and no lower extremity edema ABDOMEN: soft, non-tender, non-distended, normal bowel sounds. Mild possible splenomegaly  Musculoskeletal: no cyanosis of digits and no clubbing  PSYCH: alert & oriented x 3, fluent speech NEURO: no focal motor/sensory deficits  LABORATORY DATA:  I have reviewed the data as listed    Latest Ref Rng & Units 06/07/2022    1:12 PM 06/17/2020    9:59 AM 08/26/2018    3:31 PM  CBC  WBC 4.0 - 10.5 K/uL 6.6  6.0  8.0   Hemoglobin 12.0 - 15.0 g/dL 10.9  12.4  14.3   Hematocrit 36.0 - 46.0 % 34.5  36.2  41.6   Platelets 150 - 400 K/uL 290  247.0  268        Latest Ref Rng & Units 06/07/2022    1:12 PM 06/17/2020    9:59 AM 03/16/2020   10:02 AM  CMP  Glucose 70 - 99 mg/dL 104  84  87   BUN 8 - 23 mg/dL '12  19  18   '$ Creatinine 0.44 - 1.00 mg/dL 0.93  0.94   0.84   Sodium 135 - 145 mmol/L 135  137  137   Potassium 3.5 - 5.1 mmol/L 3.9  3.6  3.7   Chloride 98 - 111 mmol/L 97  95  98   CO2 22 - 32 mmol/L '26  31  30   '$ Calcium 8.9 - 10.3 mg/dL 9.6  9.2  9.4   Total Protein 6.5 - 8.1 g/dL 7.0  6.8    Total Bilirubin 0.3 - 1.2 mg/dL 0.3  0.5    Alkaline Phos 38 - 126 U/L 50  60    AST 15 - 41 U/L 16  15    ALT 0 - 44 U/L 21  25      RADIOGRAPHIC STUDIES: I have personally reviewed the radiological images as listed and agreed with the findings in the report. No results found.  ASSESSMENT & PLAN Encounter Diagnosis  Name Primary?   Anemia, unspecified type Yes    Susan [name] is a very pleasant 72 year old patient who presents with mild normocytic anemia. Known iron and B12 deficiency. First and foremost she would benefit from a repeat colonoscopy and endoscopy. I suspect that she has some sort of slow upper GI bleed likely secondary to her NSAID use or possibly even repeat h. Pylori infection. She has a GI who she is established with. I have asked that she contact them this week to scheduled follow up- ideally before our next visit. In the meantime she will stop her ibuprofen and switch to tylenol. She will continue working on smoking cessation. Given her strong family history of CLL/MDS I would suggest we have a more aggressive approach with patient. For this reason I am going to see her again in 2 months to recheck labs. She will continue her B12 and iron supplementation at this time. If needed we will have her get IV iron infusions. We reviewed risks and benefits today.   Orders Placed This Encounter  Procedures   CBC with Differential    Standing Status:   Future    Number of Occurrences:   1    Standing Expiration Date:   06/07/2023   CMP ([place] only)    Standing Status:   Future    Number of Occurrences:   1    Standing Expiration Date:   06/08/2023   Reticulocytes    Standing Status:   Future    Number of Occurrences:   1     Standing Expiration Date:   06/08/2023   Lactate dehydrogenase (LDH)    Standing Status:   Future    Number of Occurrences:   1    Standing Expiration Date:   06/07/2023   Vitamin B12    Standing Status:   Future    Number of Occurrences:   1    Standing Expiration Date:   06/08/2023   Folate    Standing Status:   Future    Number of Occurrences:   1    Standing Expiration Date:   06/08/2023   Ferritin    Standing Status:   Future  Number of Occurrences:   1    Standing Expiration Date:   06/08/2023   Save Smear for Provider Slide Review    Standing Status:   Future    Number of Occurrences:   1    Standing Expiration Date:   06/08/2023   Ambulatory referral to Gastroenterology    Referral Priority:   Routine    Referral Type:   Consultation    Referral Reason:   Specialty Services Required    Number of Visits Requested:   1    All questions were answered. The patient knows to call the clinic with any problems, questions or concerns.  I have spent a total of 30 minutes minutes of face-to-face and non-face-to-face time, preparing to see the patient, obtaining and/or reviewing separately obtained history, performing a medically appropriate examination, counseling and educating the patient, ordering medications/tests/procedures, referring and communicating with other health care professionals, documenting clinical information in the electronic health record, independently interpreting results and communicating results to the patient, and care coordination.    Nelwyn Salisbury PA-C Department of Hematology/Oncology [facility]

## 2022-06-14 ENCOUNTER — Ambulatory Visit: Payer: Medicare Other | Admitting: Rheumatology

## 2022-06-14 ENCOUNTER — Telehealth: Payer: Self-pay | Admitting: *Deleted

## 2022-06-14 DIAGNOSIS — J440 Chronic obstructive pulmonary disease with acute lower respiratory infection: Secondary | ICD-10-CM

## 2022-06-14 DIAGNOSIS — E039 Hypothyroidism, unspecified: Secondary | ICD-10-CM

## 2022-06-14 DIAGNOSIS — Z86718 Personal history of other venous thrombosis and embolism: Secondary | ICD-10-CM

## 2022-06-14 DIAGNOSIS — G63 Polyneuropathy in diseases classified elsewhere: Secondary | ICD-10-CM

## 2022-06-14 DIAGNOSIS — Z227 Latent tuberculosis: Secondary | ICD-10-CM

## 2022-06-14 DIAGNOSIS — R911 Solitary pulmonary nodule: Secondary | ICD-10-CM

## 2022-06-14 DIAGNOSIS — M159 Polyosteoarthritis, unspecified: Secondary | ICD-10-CM

## 2022-06-14 DIAGNOSIS — E1142 Type 2 diabetes mellitus with diabetic polyneuropathy: Secondary | ICD-10-CM

## 2022-06-14 DIAGNOSIS — I10 Essential (primary) hypertension: Secondary | ICD-10-CM

## 2022-06-14 DIAGNOSIS — Z8719 Personal history of other diseases of the digestive system: Secondary | ICD-10-CM

## 2022-06-14 DIAGNOSIS — F419 Anxiety disorder, unspecified: Secondary | ICD-10-CM

## 2022-06-14 DIAGNOSIS — Z800 Family history of malignant neoplasm of digestive organs: Secondary | ICD-10-CM

## 2022-06-14 DIAGNOSIS — Z8639 Personal history of other endocrine, nutritional and metabolic disease: Secondary | ICD-10-CM

## 2022-06-14 DIAGNOSIS — C50111 Malignant neoplasm of central portion of right female breast: Secondary | ICD-10-CM

## 2022-06-14 DIAGNOSIS — R590 Localized enlarged lymph nodes: Secondary | ICD-10-CM

## 2022-06-14 DIAGNOSIS — M1712 Unilateral primary osteoarthritis, left knee: Secondary | ICD-10-CM

## 2022-06-14 DIAGNOSIS — K760 Fatty (change of) liver, not elsewhere classified: Secondary | ICD-10-CM

## 2022-06-14 DIAGNOSIS — Z803 Family history of malignant neoplasm of breast: Secondary | ICD-10-CM

## 2022-06-14 DIAGNOSIS — M7062 Trochanteric bursitis, left hip: Secondary | ICD-10-CM

## 2022-06-14 DIAGNOSIS — M7918 Myalgia, other site: Secondary | ICD-10-CM

## 2022-06-14 DIAGNOSIS — M797 Fibromyalgia: Secondary | ICD-10-CM

## 2022-06-14 DIAGNOSIS — G4733 Obstructive sleep apnea (adult) (pediatric): Secondary | ICD-10-CM

## 2022-06-14 NOTE — Telephone Encounter (Signed)
Message received from patient requesting a call back regarding lab results from 06/07/22.  Lab results reviewed by Nelwyn Salisbury PA.  Call placed back to patient and patient notified per order of S. Berwind PA that "she is still very iron deficient. Please have her let us know if she is interested in iron infusion and I will place orders. If not I would recommend Fusion plus iron every morning with orange juice with recheck visit in 2 months."  Pt is requesting to receive IV iron. Marquis Lunch PA notified. Pt is appreciative of call and has no further questions at this time.

## 2022-06-15 ENCOUNTER — Telehealth: Payer: Self-pay | Admitting: *Deleted

## 2022-06-15 ENCOUNTER — Other Ambulatory Visit: Payer: Self-pay | Admitting: Medical Oncology

## 2022-06-15 DIAGNOSIS — D509 Iron deficiency anemia, unspecified: Secondary | ICD-10-CM | POA: Insufficient documentation

## 2022-06-15 NOTE — Telephone Encounter (Signed)
Per scheduling message [place] patient and lvm for a call back to schedule (5) doses of IV Iron

## 2022-06-18 ENCOUNTER — Inpatient Hospital Stay: Payer: Medicare Other

## 2022-06-18 VITALS — BP 100/44 | HR 64 | Temp 98.3°F | Resp 18

## 2022-06-18 DIAGNOSIS — D509 Iron deficiency anemia, unspecified: Secondary | ICD-10-CM

## 2022-06-18 MED ORDER — SODIUM CHLORIDE 0.9 % IV SOLN: Freq: Once | INTRAVENOUS | Status: AC

## 2022-06-18 MED ORDER — SODIUM CHLORIDE 0.9 % IV SOLN
200.0000 mg | Freq: Once | INTRAVENOUS | Status: AC
  Administered 2022-06-18: 200 mg via INTRAVENOUS
  Filled 2022-06-18: qty 200

## 2022-06-18 NOTE — Patient Instructions (Signed)

## 2022-06-25 ENCOUNTER — Inpatient Hospital Stay: Payer: Medicare Other

## 2022-06-25 VITALS — BP 105/44 | HR 69 | Temp 97.7°F | Resp 17

## 2022-06-25 DIAGNOSIS — D509 Iron deficiency anemia, unspecified: Secondary | ICD-10-CM

## 2022-06-25 MED ORDER — SODIUM CHLORIDE 0.9 % IV SOLN
200.0000 mg | Freq: Once | INTRAVENOUS | Status: AC
  Administered 2022-06-25: 200 mg via INTRAVENOUS
  Filled 2022-06-25: qty 200

## 2022-06-25 MED ORDER — SODIUM CHLORIDE 0.9 % IV SOLN: Freq: Once | INTRAVENOUS | Status: AC

## 2022-06-25 NOTE — Patient Instructions (Signed)

## 2022-07-02 ENCOUNTER — Inpatient Hospital Stay: Payer: Medicare Other

## 2022-07-02 VITALS — BP 110/51 | HR 63 | Temp 98.1°F | Resp 18

## 2022-07-02 DIAGNOSIS — D509 Iron deficiency anemia, unspecified: Secondary | ICD-10-CM | POA: Diagnosis not present

## 2022-07-02 MED ORDER — SODIUM CHLORIDE 0.9 % IV SOLN: Freq: Once | INTRAVENOUS | Status: AC

## 2022-07-02 MED ORDER — SODIUM CHLORIDE 0.9 % IV SOLN
200.0000 mg | Freq: Once | INTRAVENOUS | Status: AC
  Administered 2022-07-02: 200 mg via INTRAVENOUS
  Filled 2022-07-02: qty 200

## 2022-07-02 NOTE — Patient Instructions (Signed)

## 2022-07-09 ENCOUNTER — Ambulatory Visit: Payer: Medicare Other

## 2022-07-13 ENCOUNTER — Inpatient Hospital Stay: Payer: Medicare Other | Attending: Hematology & Oncology

## 2022-07-13 VITALS — BP 117/61 | HR 64 | Temp 98.1°F | Resp 18

## 2022-07-13 DIAGNOSIS — E611 Iron deficiency: Secondary | ICD-10-CM | POA: Diagnosis present

## 2022-07-13 DIAGNOSIS — D509 Iron deficiency anemia, unspecified: Secondary | ICD-10-CM

## 2022-07-13 MED ORDER — SODIUM CHLORIDE 0.9 % IV SOLN
200.0000 mg | Freq: Once | INTRAVENOUS | Status: AC
  Administered 2022-07-13: 200 mg via INTRAVENOUS
  Filled 2022-07-13: qty 200

## 2022-07-13 MED ORDER — SODIUM CHLORIDE 0.9 % IV SOLN: Freq: Once | INTRAVENOUS | Status: AC

## 2022-07-13 NOTE — Patient Instructions (Signed)

## 2022-07-16 ENCOUNTER — Ambulatory Visit: Payer: Medicare Other

## 2022-07-20 ENCOUNTER — Inpatient Hospital Stay: Payer: Medicare Other

## 2022-07-20 VITALS — BP 116/80 | HR 68 | Temp 98.9°F | Resp 18

## 2022-07-20 DIAGNOSIS — D509 Iron deficiency anemia, unspecified: Secondary | ICD-10-CM

## 2022-07-20 DIAGNOSIS — E611 Iron deficiency: Secondary | ICD-10-CM | POA: Diagnosis not present

## 2022-07-20 MED ORDER — SODIUM CHLORIDE 0.9 % IV SOLN: Freq: Once | INTRAVENOUS | Status: AC

## 2022-07-20 MED ORDER — SODIUM CHLORIDE 0.9 % IV SOLN
200.0000 mg | Freq: Once | INTRAVENOUS | Status: AC
  Administered 2022-07-20: 200 mg via INTRAVENOUS
  Filled 2022-07-20: qty 200

## 2022-07-20 NOTE — Patient Instructions (Signed)

## 2022-08-07 ENCOUNTER — Other Ambulatory Visit: Payer: Self-pay

## 2022-08-07 ENCOUNTER — Encounter: Payer: Self-pay | Admitting: Medical Oncology

## 2022-08-07 ENCOUNTER — Inpatient Hospital Stay: Payer: Medicare Other | Admitting: Medical Oncology

## 2022-08-07 ENCOUNTER — Inpatient Hospital Stay: Payer: Medicare Other | Attending: Medical Oncology

## 2022-08-07 VITALS — BP 116/53 | HR 62 | Temp 98.6°F | Resp 18 | Ht 63.0 in | Wt 214.0 lb

## 2022-08-07 DIAGNOSIS — D649 Anemia, unspecified: Secondary | ICD-10-CM | POA: Diagnosis not present

## 2022-08-07 DIAGNOSIS — K7460 Unspecified cirrhosis of liver: Secondary | ICD-10-CM | POA: Insufficient documentation

## 2022-08-07 DIAGNOSIS — D509 Iron deficiency anemia, unspecified: Secondary | ICD-10-CM

## 2022-08-07 DIAGNOSIS — K760 Fatty (change of) liver, not elsewhere classified: Secondary | ICD-10-CM | POA: Insufficient documentation

## 2022-08-07 LAB — CMP (CANCER CENTER ONLY)
ALT: 26 U/L (ref 0–44)
AST: 19 U/L (ref 15–41)
Albumin: 4.7 g/dL (ref 3.5–5.0)
Alkaline Phosphatase: 52 U/L (ref 38–126)
Anion gap: 11 (ref 5–15)
BUN: 14 mg/dL (ref 8–23)
CO2: 29 mmol/L (ref 22–32)
Calcium: 9.8 mg/dL (ref 8.9–10.3)
Chloride: 100 mmol/L (ref 98–111)
Creatinine: 0.95 mg/dL (ref 0.44–1.00)
GFR, Estimated: >60 mL/min (ref >60)
Glucose, Bld: 117 mg/dL — ABNORMAL HIGH (ref 70–99)
Potassium: 4.3 mmol/L (ref 3.5–5.1)
Sodium: 140 mmol/L (ref 135–145)
Total Bilirubin: 0.4 mg/dL (ref 0.3–1.2)
Total Protein: 7 g/dL (ref 6.5–8.1)

## 2022-08-07 LAB — CBC WITH DIFFERENTIAL (CANCER CENTER ONLY)
Abs Immature Granulocytes: 0.05 10*3/uL (ref 0.00–0.07)
Basophils Absolute: 0 10*3/uL (ref 0.0–0.1)
Basophils Relative: 0 %
Eosinophils Absolute: 0.3 10*3/uL (ref 0.0–0.5)
Eosinophils Relative: 4 %
HCT: 37.9 % (ref 36.0–46.0)
Hemoglobin: 12.4 g/dL (ref 12.0–15.0)
Immature Granulocytes: 1 %
Lymphocytes Relative: 46 %
Lymphs Abs: 3 10*3/uL (ref 0.7–4.0)
MCH: 28.1 pg (ref 26.0–34.0)
MCHC: 32.7 g/dL (ref 30.0–36.0)
MCV: 85.7 fL (ref 80.0–100.0)
Monocytes Absolute: 0.4 10*3/uL (ref 0.1–1.0)
Monocytes Relative: 6 %
Neutro Abs: 2.9 10*3/uL (ref 1.7–7.7)
Neutrophils Relative %: 43 %
Platelet Count: 265 10*3/uL (ref 150–400)
RBC: 4.42 MIL/uL (ref 3.87–5.11)
RDW: 15.3 % (ref 11.5–15.5)
WBC Count: 6.7 10*3/uL (ref 4.0–10.5)
nRBC: 0 % (ref 0.0–0.2)

## 2022-08-07 LAB — RETICULOCYTES
Immature Retic Fract: 13.8 % (ref 2.3–15.9)
RBC.: 4.38 MIL/uL (ref 3.87–5.11)
Retic Count, Absolute: 76.2 10*3/uL (ref 19.0–186.0)
Retic Ct Pct: 1.7 % (ref 0.4–3.1)

## 2022-08-07 LAB — FERRITIN: Ferritin: 100 ng/mL (ref 11–307)

## 2022-08-07 LAB — SAVE SMEAR(SSMR), FOR PROVIDER SLIDE REVIEW

## 2022-08-07 LAB — IRON AND IRON BINDING CAPACITY (CC-WL,HP ONLY)
Iron: 86 ug/dL (ref 28–170)
Saturation Ratios: 22 % (ref 10.4–31.8)
TIBC: 385 ug/dL (ref 250–450)
UIBC: 299 ug/dL (ref 148–442)

## 2022-08-07 LAB — LACTATE DEHYDROGENASE: LDH: 142 U/L (ref 98–192)

## 2022-08-07 NOTE — Progress Notes (Signed)
Hematology and Oncology Follow Up Visit  Susan [name] [id number] [birth year] 72 y.o. 08/07/2022  Past Medical History:  Diagnosis Date   Anxiety and depression    Atypical chest pain 2016   ruled out for MI, stress test showed no ischemia.  CT angio NO PE.   Cellulitis of chest wall 12/09/2015   s/p breast surgery   Colon cancer screening 05/11/2018   2007 colonoscopy normal.  Cologuard neg 05/11/18; repeat 3 yrs.   COPD (chronic obstructive pulmonary disease)    mild; prn albut per Dr. Lamonte Sakai as of 2017--no f/u w/pulm since then.   DDD (degenerative disc disease)    Diabetes mellitus    w/DPN   Diabetic peripheral neuropathy    DJD (degenerative joint disease)    Fatty liver disease, nonalcoholic    Fibromyalgia    cymbalta   GERD (gastroesophageal reflux disease)    History of blood clots 2004   during cancer treatment   History of breast cancer 2004   DCIS->lumpectomy + rad. Eventually go bilat mastectomy in 2017 (Sister had breast cancer in her 49s). Last seen by onc not long after surgery, no further tx or onc f/u needed.   History of latent tuberculosis    got approp tx x 9 mo   History of palpitations 2016   sinus tachycardia.  30 day event monitor was normal (Dr. Irish Lack)   History of pneumonia    HTN (hypertension) 01/28/2012   Hyperlipidemia 01/28/2012   Hypothyroidism 02/14/2014   Lipoma 01/05/2018   Mediastinal adenopathy 2016   on CT angio done for atypical CP.  Has FH of NHL in sister. Pulm saw her 01/2016 and recommended a f/u CT 09/2016 but this doesn't appear to have been done.  Rpt CT 02/2019->resolved.   Nasal polyp 08/30/2012   Obesity    OSA on CPAP    HOme sleep study 09/02/19-->CPAP (Dr. Ander Slade w/[facility])   Osteoarthritis    L knee-"end stage"--Dr. Delfino Lovett   Osteopenia    Peripheral vascular disease 04   axillary,upper chest after 3 weeks on tamoxifen   Sialoadenitis of submandibular gland 08/27/2018   LEFT. doxy rx'd by ED. ENT f/u the next  day->reassured, dx'd with partial left submandibular gland obstruction w/out sign of infxn; f/u prn.   Tobacco abuse-unspec 10/12/2013    Principle Diagnosis:  Anemia  Current Therapy:   IV Iron- Venofer (06/18/2022, 06/25/2022, 07/02/2022, 07/13/2022, 07/20/2022)     Interim History:  Susan [name] is back for follow-up for her anemia.   Since our last visit she has had an endoscopy through [facility] (March 2024) which was normal and has a planned camera study on April 17th. According to patient the surgeon declined a repeat colonoscopy as she had just had one in June 2024. They have found that she has a fatty liver with some cirrhosis changes. She denies any bleeding episodes and in general feels much better. Of note she is also taking B12.   Wt Readings from Last 3 Encounters:  08/07/22 214 lb 0.6 oz (97.1 kg)  06/07/22 223 lb (101.2 kg)  01/30/22 222 lb (100.7 kg)     Medications:   Current Outpatient Medications:    acetaminophen (TYLENOL) 500 MG tablet, Take 1 tablet by mouth as needed., Disp: , Rfl:    albuterol (PROVENTIL) (2.5 MG/3ML) 0.083% nebulizer solution, Take 3 mLs (2.5 mg total) by nebulization every 6 (six) hours as needed for wheezing or shortness of breath., Disp: 150 mL, Rfl: 1  albuterol (VENTOLIN HFA) 108 (90 Base) MCG/ACT inhaler, Inhale 1-2 puffs into the lungs every 4 (four) hours as needed for wheezing or shortness of breath., Disp: 1 each, Rfl: 0   amitriptyline (ELAVIL) 25 MG tablet, TAKE 1 TO 2 TABLETS BY  MOUTH AT BEDTIME, Disp: 180 tablet, Rfl: 3   cholecalciferol (VITAMIN D) 1000 UNITS tablet, Take 1,000 Units by mouth daily., Disp: , Rfl:    clonazePAM (KLONOPIN) 0.5 MG tablet, Take 0.5 mg by mouth 2 (two) times daily as needed., Disp: , Rfl:    cyclobenzaprine (FLEXERIL) 10 MG tablet, TAKE 1 TABLET BY MOUTH THREE TIMES A DAY AS NEEDED FOR MUSCLE SPASMS, Disp: 60 tablet, Rfl: 1   diclofenac Sodium (VOLTAREN) 1 % GEL, Voltaren 1 % topical gel  APPLY 2 GRAM TO  THE AFFECTED AREA(S) BY TOPICAL ROUTE 2 or 3 TIMES PER DAY, Disp: , Rfl:    dicyclomine (BENTYL) 10 MG capsule, Take 1 capsule by mouth as needed., Disp: , Rfl:    DULoxetine (CYMBALTA) 30 MG capsule, TAKE 3 CAPSULES BY MOUTH  DAILY, Disp: 270 capsule, Rfl: 0   esomeprazole (NEXIUM) 40 MG capsule, Take 40 mg by mouth daily at 12 noon., Disp: , Rfl:    hydrochlorothiazide (HYDRODIURIL) 25 MG tablet, TAKE 1 TABLET BY MOUTH  DAILY, Disp: 90 tablet, Rfl: 0   levothyroxine (SYNTHROID) 50 MCG tablet, TAKE 1 TABLET BY MOUTH  DAILY BEFORE BREAKFAST, Disp: 90 tablet, Rfl: 0   lisinopril (ZESTRIL) 10 MG tablet, Take 1 tablet (10 mg total) by mouth daily. OFFICE VISIT NEEDED FOR FURTHER REFILLS, Disp: 30 tablet, Rfl: 0   metFORMIN (GLUCOPHAGE) 1000 MG tablet, TAKE 1 TABLET BY MOUTH  TWICE DAILY WITH MEALS, Disp: 180 tablet, Rfl: 0   METHYLCOBALAMIN PO, Take 2,000 mcg by mouth daily., Disp: , Rfl:    metoprolol tartrate (LOPRESSOR) 100 MG tablet, TAKE 1 TABLET BY MOUTH  TWICE DAILY, Disp: 180 tablet, Rfl: 0   Multiple Vitamins-Minerals (MULTIVITAMIN WITH MINERALS) tablet, Take 1 tablet by mouth daily., Disp: , Rfl:    Omega-3 Fatty Acids (FISH OIL PO), Take 2,000 mg by mouth daily at 12 noon., Disp: , Rfl:    pravastatin (PRAVACHOL) 40 MG tablet, TAKE 1 TABLET BY MOUTH  DAILY, Disp: 90 tablet, Rfl: 1   PSYLLIUM HUSK PO, Take 5 tablets by mouth daily at 6 (six) AM., Disp: , Rfl:   Allergies:  Allergies  Allergen Reactions   Chantix [Varenicline] Other (See Comments)    "Felt like having mental breakdown"   Ciprofloxacin Other (See Comments)    Muscle tightness, tendon aching and pain   Latex Itching   Morphine And Related Itching    MORPHINE DRIP - causes pt to feel irritable and itch   Penicillins Anaphylaxis and Swelling   Tamoxifen Other (See Comments)    Blood clots   Levaquin [Levofloxacin] Other (See Comments)    Severe tendon pain   Lyrica [Pregabalin] Swelling   Neurontin [Gabapentin]  Swelling   Ceclor [Cefaclor] Rash   Other Other (See Comments)    Redness from tape and bandaides   Sulfa Antibiotics Rash   Tape Rash    Past Medical History, Surgical history, Social history, and Family History were reviewed and updated.  Review of Systems: Review of Systems  Constitutional:  Negative for appetite change, fatigue and unexpected weight change.  HENT:   Negative for mouth sores and nosebleeds.   Respiratory:  Negative for cough and shortness of breath.  Cardiovascular:  Negative for chest pain and leg swelling.  Gastrointestinal:  Negative for abdominal pain, blood in stool and constipation.  Endocrine: Negative for hot flashes.  Genitourinary:  Negative for hematuria.   Hematological:  Negative for adenopathy. Does not bruise/bleed easily.     Physical Exam:  height is 5\' 3"  (1.6 m) and weight is 214 lb 0.6 oz (97.1 kg). Her oral temperature is 98.6 F (37 C). Her blood pressure is 116/53 (abnormal) and her pulse is 62. Her respiration is 18 and oxygen saturation is 100%.   Physical Exam Vitals and nursing note reviewed.  Constitutional:      General: She is not in acute distress.    Appearance: Normal appearance. She is not ill-appearing, toxic-appearing or diaphoretic.  HENT:     Head: Normocephalic and atraumatic.  Cardiovascular:     Rate and Rhythm: Normal rate and regular rhythm.     Pulses: Normal pulses.     Heart sounds: Normal heart sounds.  Pulmonary:     Effort: Pulmonary effort is normal.     Breath sounds: Normal breath sounds.  Abdominal:     General: Bowel sounds are normal.     Palpations: Abdomen is soft.     Tenderness: There is no abdominal tenderness. There is no guarding.  Skin:    Coloration: Skin is not jaundiced.  Neurological:     Mental Status: She is alert.       Lab Results  Component Value Date   WBC 6.7 08/07/2022   HGB 12.4 08/07/2022   HCT 37.9 08/07/2022   MCV 85.7 08/07/2022   PLT 265 08/07/2022      Chemistry      Component Value Date/Time   NA 135 06/07/2022 1312   NA 141 04/09/2018 1545   NA 141 09/15/2015 1035   K 3.9 06/07/2022 1312   K 4.0 09/15/2015 1035   CL 97 (L) 06/07/2022 1312   CO2 26 06/07/2022 1312   CO2 30 (H) 09/15/2015 1035   BUN 12 06/07/2022 1312   BUN 12 04/09/2018 1545   BUN 15.5 09/15/2015 1035   CREATININE 0.93 06/07/2022 1312   CREATININE 0.91 01/25/2016 1011   CREATININE 0.9 09/15/2015 1035      Component Value Date/Time   CALCIUM 9.6 06/07/2022 1312   CALCIUM 9.8 09/15/2015 1035   ALKPHOS 50 06/07/2022 1312   ALKPHOS 60 09/15/2015 1035   AST 16 06/07/2022 1312   AST 35 (H) 09/15/2015 1035   ALT 21 06/07/2022 1312   ALT 53 09/15/2015 1035   BILITOT 0.3 06/07/2022 1312   BILITOT 0.31 09/15/2015 1035      Impression and Plan: 1. Iron deficiency anemia, unspecified iron deficiency anemia type - CBC with Differential (Cancer Center Only); Future - CMP ([facility] only); Future - Reticulocytes; Future - Lactate dehydrogenase (LDH); Future - Ferritin; Future - Save Smear for Provider Slide Review; Future    Susan [name]   Disposition: Happy to hear that she is working closely with GI. Glad to hear that her endoscopy was normal. I agree that she would benefit from a camera study. Labs today are reassuring. Hgb has improved from 10.9 to 12.4. Reticulocytes have normalized. Likely does not require additional IV iron at this time but will supplement if needed. CMP is unremarkable other than her non-fasting glucose level. I have advised her to continue working with GI. Continue her B12 supplementation. RTC 6 months or sooner as needed.     Nelwyn Salisbury  PA-C 4/2/202410:47 AM

## 2023-01-21 NOTE — Progress Notes (Signed)
Office Visit Note  Patient: Susan [name]             Date of Birth: Jul 14, 1950           MRN: [id number]             PCP: April Manson, NP Referring: April Manson, NP Visit Date: 02/04/2023 Occupation: @GUAROCC @  Subjective:  Pain in multiple joints  History of Present Illness: Susan [name] is a 72 y.o. female seen in consultation per quest of her PCP.  According the patient her symptoms started in her early 44s with thoracic and lower back pain.  She states she used to take ibuprofen 4 tablets at bedtime to relieve her discomfort.  Gradually she started having neuropathy and was seen by neurologist.  She states she was told that the neuropathy was coming from the cervical thoracic spine region.  She did not have follow-up with the neurologist.  She dealt with neuropathy all her life.  She states about 20 years ago she was also diagnosed with fibromyalgia syndrome.  In 2000 she started having bilateral knee joint pain and underwent right total knee replacement in 2007 by Dr. Constance Goltz.  She had good response to the total knee replacement.  She states for her left knee osteoarthritis she has been followed by Dr. Valentina Gu.  She believes that she has left chondromalacia patella.  She continues to have discomfort in her right shoulder, bilateral hands, left hip, left knee and bilateral feet.  She had bilateral bunionectomy in 2002 by Dr. Charlsie Merles.  She has noticed hammertoes in her feet.  She has difficulty walking due to hammertoes and neuropathy.  She has not noticed any joint swelling.  She gives history of significant morning stiffness and nocturnal pain in her lower back.  She denies any personal history of psoriasis.  There is family history of psoriatic arthritis in her first cousin and ankylosing spondylitis and her daughter.  She is gravida 5, para 3, miscarriages 2.    Activities of Daily Living:  Patient reports morning stiffness for 1 hour.   Patient Reports nocturnal pain.   Difficulty dressing/grooming: Denies Difficulty climbing stairs: Denies Difficulty getting out of chair: Denies Difficulty using hands for taps, buttons, cutlery, and/or writing: Reports  Review of Systems  Constitutional:  Positive for fatigue.  HENT:  Positive for mouth dryness. Negative for mouth sores.   Eyes:  Positive for dryness.  Respiratory:  Negative for shortness of breath.   Cardiovascular:  Positive for chest pain. Negative for palpitations.  Gastrointestinal:  Positive for constipation and diarrhea. Negative for blood in stool.  Endocrine: Negative for increased urination.  Genitourinary:  Negative for involuntary urination.  Musculoskeletal:  Positive for joint pain, gait problem, joint pain, joint swelling, myalgias, muscle weakness, morning stiffness, muscle tenderness and myalgias.  Skin:  Positive for hair loss, redness and sensitivity to sunlight. Negative for color change and rash.  Allergic/Immunologic: Negative for susceptible to infections.  Neurological:  Positive for numbness. Negative for dizziness and headaches.  Hematological:  Positive for swollen glands.  Psychiatric/Behavioral:  Positive for depressed mood and sleep disturbance. The patient is nervous/anxious.     PMFS History:  Patient Active Problem List   Diagnosis Date Noted   IDA (iron deficiency anemia) 06/15/2022   Osteoarthritis of left knee 11/17/2019   Trochanteric bursitis of left hip 05/22/2019   Anxiety and depression 01/05/2018   Osteoarthritis 01/05/2018   Fatty liver disease, nonalcoholic 01/05/2018   Induration,  breast 01/05/2018   Left shoulder pain 01/05/2018   DDD (degenerative disc disease) 10/28/2017   GERD (gastroesophageal reflux disease) 10/28/2017   Fibromyalgia 10/28/2017   Obesity 10/28/2017   History of blood clots 10/28/2017   Cancer of central portion of right breast (HCC) 10/26/2015   Genetic testing 10/17/2015   History of breast cancer in female 09/15/2015    Breast cancer of upper-outer quadrant of right female breast (HCC) 09/15/2015   Family history of breast cancer in female 09/15/2015   Family history of colon cancer 09/15/2015   TB lung, latent 01/11/2015   COPD (chronic obstructive pulmonary disease) (HCC) 10/07/2014   Mediastinal lymphadenopathy 10/07/2014   Diabetes mellitus type 2, controlled (HCC) 09/29/2014   Pulmonary nodule 09/29/2014   History of chest pain 09/29/2014   Essential hypertension 09/29/2014   Chest pain 09/23/2014   Hypothyroidism 02/14/2014   Hip pain, right 10/12/2013   OSA on CPAP 05/31/2013   Cervical cancer screening 03/09/2013   Seasonal allergies 09/12/2012   SOM (serous otitis media) 08/30/2012   Peripheral neuropathy 07/19/2012   Cervical myofascial pain syndrome 06/08/2012   Hyperlipidemia 01/28/2012   Hyperglycemia 01/28/2012   Neck pain 12/30/2011   Muscle spasm 12/30/2011    Past Medical History:  Diagnosis Date   Anxiety and depression    Atypical chest pain 2016   ruled out for MI, stress test showed no ischemia.  CT angio NO PE.   Cellulitis of chest wall 12/09/2015   s/p breast surgery   Colon cancer screening 05/11/2018   2007 colonoscopy normal.  Cologuard neg 05/11/18; repeat 3 yrs.   COPD (chronic obstructive pulmonary disease) (HCC)    mild; prn albut per Dr. Delton Coombes as of 2017--no f/u w/pulm since then.   DDD (degenerative disc disease)    Diabetes mellitus (HCC)    w/DPN   Diabetic peripheral neuropathy (HCC)    DJD (degenerative joint disease)    Fatty liver disease, nonalcoholic    Fibromyalgia    cymbalta   GERD (gastroesophageal reflux disease)    History of blood clots 2004   during cancer treatment   History of breast cancer 2004   DCIS->lumpectomy + rad. Eventually go bilat mastectomy in 2017 (Sister had breast cancer in her 30s). Last seen by onc not long after surgery, no further tx or onc f/u needed.   History of latent tuberculosis    got approp tx x 9 mo    History of palpitations 2016   sinus tachycardia.  30 day event monitor was normal (Dr. Eldridge Dace)   History of pneumonia    HTN (hypertension) 01/28/2012   Hyperlipidemia 01/28/2012   Hypothyroidism 02/14/2014   Lipoma 01/05/2018   Mediastinal adenopathy 2016   on CT angio done for atypical CP.  Has FH of NHL in sister. Pulm saw her 01/2016 and recommended a f/u CT 09/2016 but this doesn't appear to have been done.  Rpt CT 02/2019->resolved.   Nasal polyp 08/30/2012   Obesity    OSA on CPAP    HOme sleep study 09/02/19-->CPAP (Dr. Wynona Neat w/ pulm)   Osteoarthritis    L knee-"end stage"--Dr. Veda Canning   Osteopenia    Peripheral vascular disease (HCC) 04   axillary,upper chest after 3 weeks on tamoxifen   Sialoadenitis of submandibular gland 08/27/2018   LEFT. doxy rx'd by ED. ENT f/u the next day->reassured, dx'd with partial left submandibular gland obstruction w/out sign of infxn; f/u prn.   Tobacco abuse-unspec 10/12/2013    Family  History  Problem Relation Age of Onset   Hepatitis C Mother    Cancer Mother 23       liver cancer, hep c   Heart disease Mother        chf   Hypertension Mother    Hyperlipidemia Mother    Osteoporosis Mother    Cancer Father 25       lung; smoker, heart   COPD Father    Non-Hodgkin's lymphoma Sister 49       large B cell   Heart disease Sister    Kidney disease Sister    Hypertension Sister    Other Sister        thalessemia, anemia; hx of hysterectomy in her 93s   Hyperlipidemia Sister    Gout Sister    Diverticulitis Sister    Melanoma Brother    Heart disease Brother    Hypertension Brother    Arthritis Brother    Kidney disease Brother    Prostate cancer Brother        dx. early 25s   Melanoma Brother    Leukemia Brother        chronic lymphatic leukemia -   Heart disease Brother    Hypertension Brother    Benign prostatic hyperplasia Brother    Heart disease Brother    Hypertension Brother    COPD Brother    Prostate  cancer Brother        dx. mid-60s   Myelodysplastic syndrome Brother        dx. early 30s   Hypertension Brother    Hyperlipidemia Brother    Hepatitis C Brother    Hemachromatosis Brother    Parkinson's disease Brother    Breast cancer Maternal Aunt 46   Emphysema Paternal Aunt    Lung cancer Paternal Aunt        d. early 1s; smoker   Vision loss Maternal Grandmother    Dementia Maternal Grandmother    Coronary artery disease Maternal Grandmother    Heart Problems Maternal Grandmother    Vision loss Paternal Grandfather    Other Daughter        Beal's Syndrome   Arthritis Daughter        Beal's   Other Daughter        overweight   Ankylosing spondylitis Daughter    Hypertension Daughter    Arthritis Son        Beal's connective tissue disease   Lung cancer Cousin        maternal 1st cousin dx. 3 or younger; former smoker   Colon cancer Cousin        maternal 1st cousin dx. late 50s-early 80s   Cancer Cousin        maternal 1st cousin d. NOS cancer   Leukemia Cousin        paternal 1st cousin d. early 4s   Colon cancer Other 83       niece   Melanoma Other        niece   Past Surgical History:  Procedure Laterality Date   ABDOMINAL HYSTERECTOMY  1991   APPENDECTOMY  1991   BILATERAL OOPHORECTOMY  01/2003   BREAST LUMPECTOMY Left 2004   BREAST SURGERY Left 2004   lumpectomy (DCIS)+ radiation   CARPAL TUNNEL RELEASE     right   COLONOSCOPY  10/15/2005   Dr. Everett Graff, poor prep   HOME SLEEP STUDY  09/02/2019   mod OSA->CPAP   INCISION AND  DRAINAGE ABSCESS Left 12/13/2015   Procedure: DRAINAGE LEFT MASTECTOMY WOUND INFECTION;  Surgeon: Claud Kelp, MD;  Location: [facility] OR;  Service: General;  Laterality: Left;   MASTECTOMY W/ SENTINEL NODE BIOPSY Bilateral 10/26/2015   Procedure: RIGHT TOTAL MASTECTOMY WITH RIGHT SENTINEL LYMPH NODE BIOPSY, INJECT BLUE DYE RIGHT BREAST, LEFT BREAST PROPHYLACTIC MASTECTOMY;  Surgeon: Claud Kelp, MD;  Location: [facility] OR;   Service: General;  Laterality: Bilateral;   NASAL SEPTUM SURGERY  2008   PILONIDAL CYST EXCISION     THYROIDECTOMY, PARTIAL  mid 80's   right side   TOTAL KNEE ARTHROPLASTY Right    TRANSTHORACIC ECHOCARDIOGRAM  05/25/2008   ? mild increase in LV filling pressure, EF 65%, normal LV function/wall motion, no LVH or ventricular enlargement.   Social History   Social History Narrative   Married, 3 children.  Two have Beal's syndrome (connective tissue d/o similar to Marfan's).   Orig from [place].  [place] since 22s.   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   Tobacco (current as of 08/2018).   No alcohol.   Immunization History  Administered Date(s) Administered   Fluad Quad(high Dose 65+) 02/05/2020   Influenza Split 01/28/2012   Influenza, High Dose Seasonal PF 02/25/2018   Influenza, Quadrivalent, Recombinant, Inj, Pf 02/10/2019   Influenza,inj,Quad PF,6+ Mos 01/09/2013, 02/12/2014, 04/12/2015, 01/25/2016, 01/24/2017   PFIZER(Purple Top)SARS-COV-2 Vaccination 07/06/2019, 07/27/2019, 02/05/2020   Pfizer Covid-19 Vaccine Bivalent Booster 25yrs & up 04/20/2021   Pneumococcal Conjugate-13 03/09/2013   Pneumococcal Polysaccharide-23 09/29/2014, 03/16/2020   Tdap 12/26/2012   Zoster, Live 12/20/2014     Objective: Vital Signs: BP 134/71 (BP Location: Left Arm, Patient Position: Sitting, Cuff Size: Normal)   Pulse 61   Resp 15   Ht 5\' 3"  (1.6 m)   Wt 213 lb (96.6 kg)   BMI 37.73 kg/m    Physical Exam Vitals and nursing note reviewed.  Constitutional:      Appearance: She is well-developed.  HENT:     Head: Normocephalic and atraumatic.  Eyes:     Conjunctiva/sclera: Conjunctivae normal.  Cardiovascular:     Rate and Rhythm: Normal rate and regular rhythm.     Heart sounds: Normal heart sounds.  Pulmonary:     Effort: Pulmonary effort is normal.     Breath sounds: Normal breath sounds.  Abdominal:      General: Bowel sounds are normal.     Palpations: Abdomen is soft.  Musculoskeletal:     Cervical back: Normal range of motion.  Lymphadenopathy:     Cervical: No cervical adenopathy.  Skin:    General: Skin is warm and dry.     Capillary Refill: Capillary refill takes less than 2 seconds.  Neurological:     Mental Status: She is alert and oriented to person, place, and time.  Psychiatric:        Behavior: Behavior normal.      Musculoskeletal Exam: Cervical spine was in good range of motion without any discomfort.  She had no tenderness over thoracic spine.  She had good mobility in her lumbar spine.  Schober's was negative.  She had mild tenderness over the lower lumbar region and over the left SI joint.  She also had tenderness over left trochanteric bursa.  Shoulder joints, elbow joints, wrist joints, MCPs PIPs and DIPs were in good range of motion with no synovitis.  DIP thickening was noted bilaterally without any synovitis.  Hip  joints and knee joints were in good range of motion.  Right knee joint was replaced.  She had bilateral bunionectomy.  Hammertoes were noted more prominent in the right foot.  No synovitis was noted.  CDAI Exam: CDAI Score: -- Patient Global: --; Provider Global: -- Swollen: --; Tender: -- Joint Exam 02/04/2023   No joint exam has been documented for this visit   There is currently no information documented on the homunculus. Go to the Rheumatology activity and complete the homunculus joint exam.  Investigation: No additional findings.  Imaging: No results found.  Recent Labs: Lab Results  Component Value Date   WBC 6.7 08/07/2022   HGB 12.4 08/07/2022   PLT 265 08/07/2022   NA 140 08/07/2022   K 4.3 08/07/2022   CL 100 08/07/2022   CO2 29 08/07/2022   GLUCOSE 117 (H) 08/07/2022   BUN 14 08/07/2022   CREATININE 0.95 08/07/2022   BILITOT 0.4 08/07/2022   ALKPHOS 52 08/07/2022   AST 19 08/07/2022   ALT 26 08/07/2022   PROT 7.0  08/07/2022   ALBUMIN 4.7 08/07/2022   CALCIUM 9.8 08/07/2022   GFRAA >60 08/26/2018   QFTBGOLD Positive (A) 10/07/2014   Sep 26, 2021 ANA negative, RF negative, ESR 6, uric acid 6.8, HLA-B27 positive  Speciality Comments: No specialty comments available.  Procedures:  No procedures performed Allergies: Chantix [varenicline], Ciprofloxacin, Latex, Morphine and codeine, Penicillins, Tamoxifen, Levaquin [levofloxacin], Lyrica [pregabalin], Neurontin [gabapentin], Ceclor [cefaclor], Other, Sulfa antibiotics, and Tape   Assessment / Plan:     Visit Diagnoses: HLA B27 positive-patient has no clinical features of ankylosing spondylitis.  Clinical and radiographic findings were consistent with degenerative disc disease.  Advised patient to contact me if she develops any inflammatory arthritis in the future.  DDD (degenerative disc disease), cervical-patient complains of pain and stiffness of cervical spine for many years.  She states the symptoms improved after she started sleeping on a neck pillow.  I reviewed CT of the cervical spine without contrast from September 2019 which showed degenerative changes.  She had good range of motion of the cervical spine today without any radiculopathy.  DDD (degenerative disc disease), lumbar -she complains of pain and discomfort in her lower back for many years.  She reports was negative.  She has some tenderness on palpation over lower lumbar region and over the left SI joint.  I will obtain x-rays today.  Plan: XR Lumbar Spine 2-3 Views, XR Pelvis 1-2 Views.  X-rays of the lumbar spine were suggestive of multilevel spondylosis, dextroscoliosis and facet joint arthropathy.  X-rays of bilateral SI joints were unremarkable.  X-ray findings were reviewed with the patient.  I offered physical therapy which she declined.  She may benefit from seeing a spine specialist.  Patient wants to see a spine specialist at [facility].  She will schedule an  appointment.  Primary osteoarthritis of both hands-she complains of pain and stiffness in her hands.  No synovitis was noted.  Bilateral DIP thickening consistent with osteoarthritis was noted.  There was no nail pitting or nail dystrophy.  Status post total knee replacement, right-patient states that she had right total knee replacement by Dr. Charlann Boxer in 2017 which has been doing well.  Primary osteoarthritis of left knee-she has been under care of Dr. Valentina Gu for osteoarthritis of the knee joint and chondromalacia patella per patient.  She states Dr. Valentina Gu has discussed total knee replacement with her.  Primary osteoarthritis of both feet-she had bilateral bunionectomy by  Dr. Charlsie Merles in 2002.  She had hammertoes more prominent in the right foot.  History of breast cancer - 2004-left lumpectomy and RTX, 2017 Right breast-bilateral mastectomy no CTX or RTX.Not followed by oncologist.  Fibromyalgia  Cervical myofascial pain syndrome  Anxiety and depression  Controlled type 2 diabetes mellitus with diabetic polyneuropathy, without long-term current use of insulin (HCC)  Essential hypertension  Mixed hyperlipidemia  History of blood clots - DVTs after lumpectomy.  Fatty liver disease, nonalcoholic  Gastroesophageal reflux disease without esophagitis  Acquired hypothyroidism  Iron deficiency anemia, unspecified iron deficiency anemia type  Seasonal allergies  TB lung, latent - Ttd x 1 year.  OSA on CPAP  History of COPD  Smoker - 1PPD and reduced to 1/4 PPD over more than 50 years.  Family history of ankylosing spondylitis-daughter  Family history of psoriatic arthritis-cousin  Orders: Orders Placed This Encounter  Procedures   XR Lumbar Spine 2-3 Views   XR Pelvis 1-2 Views   No orders of the defined types were placed in this encounter.   Face-to-face time spent with patient was 47 minutes. Greater than 50% of time was spent in counseling and coordination of  care.  Follow-Up Instructions: Return for Osteoarthritis.   Pollyann Savoy, MD  Note - This record has been created using Animal nutritionist.  Chart creation errors have been sought, but may not always  have been located. Such creation errors do not reflect on  the standard of medical care.

## 2023-02-04 ENCOUNTER — Encounter: Payer: Self-pay | Admitting: Rheumatology

## 2023-02-04 ENCOUNTER — Ambulatory Visit: Payer: Medicare Other | Attending: Rheumatology | Admitting: Rheumatology

## 2023-02-04 ENCOUNTER — Ambulatory Visit (INDEPENDENT_AMBULATORY_CARE_PROVIDER_SITE_OTHER): Payer: Medicare Other

## 2023-02-04 VITALS — BP 134/71 | HR 61 | Resp 15 | Ht 63.0 in | Wt 213.0 lb

## 2023-02-04 DIAGNOSIS — F17200 Nicotine dependence, unspecified, uncomplicated: Secondary | ICD-10-CM

## 2023-02-04 DIAGNOSIS — R590 Localized enlarged lymph nodes: Secondary | ICD-10-CM

## 2023-02-04 DIAGNOSIS — F32A Depression, unspecified: Secondary | ICD-10-CM

## 2023-02-04 DIAGNOSIS — J302 Other seasonal allergic rhinitis: Secondary | ICD-10-CM

## 2023-02-04 DIAGNOSIS — Z96651 Presence of right artificial knee joint: Secondary | ICD-10-CM

## 2023-02-04 DIAGNOSIS — Z1589 Genetic susceptibility to other disease: Secondary | ICD-10-CM | POA: Diagnosis not present

## 2023-02-04 DIAGNOSIS — M19049 Primary osteoarthritis, unspecified hand: Secondary | ICD-10-CM | POA: Diagnosis not present

## 2023-02-04 DIAGNOSIS — E782 Mixed hyperlipidemia: Secondary | ICD-10-CM

## 2023-02-04 DIAGNOSIS — E1142 Type 2 diabetes mellitus with diabetic polyneuropathy: Secondary | ICD-10-CM

## 2023-02-04 DIAGNOSIS — D509 Iron deficiency anemia, unspecified: Secondary | ICD-10-CM

## 2023-02-04 DIAGNOSIS — M1712 Unilateral primary osteoarthritis, left knee: Secondary | ICD-10-CM

## 2023-02-04 DIAGNOSIS — M5030 Other cervical disc degeneration, unspecified cervical region: Secondary | ICD-10-CM | POA: Diagnosis not present

## 2023-02-04 DIAGNOSIS — Z840 Family history of diseases of the skin and subcutaneous tissue: Secondary | ICD-10-CM

## 2023-02-04 DIAGNOSIS — G4733 Obstructive sleep apnea (adult) (pediatric): Secondary | ICD-10-CM

## 2023-02-04 DIAGNOSIS — M51369 Other intervertebral disc degeneration, lumbar region without mention of lumbar back pain or lower extremity pain: Secondary | ICD-10-CM

## 2023-02-04 DIAGNOSIS — M19041 Primary osteoarthritis, right hand: Secondary | ICD-10-CM

## 2023-02-04 DIAGNOSIS — K219 Gastro-esophageal reflux disease without esophagitis: Secondary | ICD-10-CM

## 2023-02-04 DIAGNOSIS — M7918 Myalgia, other site: Secondary | ICD-10-CM

## 2023-02-04 DIAGNOSIS — I10 Essential (primary) hypertension: Secondary | ICD-10-CM

## 2023-02-04 DIAGNOSIS — M19071 Primary osteoarthritis, right ankle and foot: Secondary | ICD-10-CM

## 2023-02-04 DIAGNOSIS — M5136 Other intervertebral disc degeneration, lumbar region: Secondary | ICD-10-CM

## 2023-02-04 DIAGNOSIS — E039 Hypothyroidism, unspecified: Secondary | ICD-10-CM

## 2023-02-04 DIAGNOSIS — M19072 Primary osteoarthritis, left ankle and foot: Secondary | ICD-10-CM

## 2023-02-04 DIAGNOSIS — Z8709 Personal history of other diseases of the respiratory system: Secondary | ICD-10-CM

## 2023-02-04 DIAGNOSIS — Z853 Personal history of malignant neoplasm of breast: Secondary | ICD-10-CM

## 2023-02-04 DIAGNOSIS — K760 Fatty (change of) liver, not elsewhere classified: Secondary | ICD-10-CM

## 2023-02-04 DIAGNOSIS — R911 Solitary pulmonary nodule: Secondary | ICD-10-CM

## 2023-02-04 DIAGNOSIS — M797 Fibromyalgia: Secondary | ICD-10-CM

## 2023-02-04 DIAGNOSIS — M19042 Primary osteoarthritis, left hand: Secondary | ICD-10-CM

## 2023-02-04 DIAGNOSIS — Z8269 Family history of other diseases of the musculoskeletal system and connective tissue: Secondary | ICD-10-CM

## 2023-02-04 DIAGNOSIS — Z86718 Personal history of other venous thrombosis and embolism: Secondary | ICD-10-CM

## 2023-02-04 DIAGNOSIS — F419 Anxiety disorder, unspecified: Secondary | ICD-10-CM

## 2023-02-04 DIAGNOSIS — Z227 Latent tuberculosis: Secondary | ICD-10-CM

## 2023-02-04 DIAGNOSIS — M19079 Primary osteoarthritis, unspecified ankle and foot: Secondary | ICD-10-CM

## 2023-02-04 NOTE — Patient Instructions (Signed)
Low Back Sprain or Strain Rehab Ask your health care provider which exercises are safe for you. Do exercises exactly as told by your health care provider and adjust them as directed. It is normal to feel mild stretching, pulling, tightness, or discomfort as you do these exercises. Stop right away if you feel sudden pain or your pain gets worse. Do not begin these exercises until told by your health care provider. Stretching and range-of-motion exercises These exercises warm up your muscles and joints and improve the movement and flexibility of your back. These exercises also help to relieve pain, numbness, and tingling. Lumbar rotation  Lie on your back on a firm bed or the floor with your knees bent. Straighten your arms out to your sides so each arm forms a 90-degree angle (right angle) with a side of your body. Slowly move (rotate) both of your knees to one side of your body until you feel a stretch in your lower back (lumbar). Try not to let your shoulders lift off the floor. Hold this position for __________ seconds. Tense your abdominal muscles and slowly move your knees back to the starting position. Repeat this exercise on the other side of your body. Repeat __________ times. Complete this exercise __________ times a day. Single knee to chest  Lie on your back on a firm bed or the floor with both legs straight. Bend one of your knees. Use your hands to move your knee up toward your chest until you feel a gentle stretch in your lower back and buttock. Hold your leg in this position by holding on to the front of your knee. Keep your other leg as straight as possible. Hold this position for __________ seconds. Slowly return to the starting position. Repeat with your other leg. Repeat __________ times. Complete this exercise __________ times a day. Prone extension on elbows  Lie on your abdomen on a firm bed or the floor (prone position). Prop yourself up on your elbows. Use your arms  to help lift your chest up until you feel a gentle stretch in your abdomen and your lower back. This will place some of your body weight on your elbows. If this is uncomfortable, try stacking pillows under your chest. Your hips should stay down, against the surface that you are lying on. Keep your hip and back muscles relaxed. Hold this position for __________ seconds. Slowly relax your upper body and return to the starting position. Repeat __________ times. Complete this exercise __________ times a day. Strengthening exercises These exercises build strength and endurance in your back. Endurance is the ability to use your muscles for a long time, even after they get tired. Pelvic tilt This exercise strengthens the muscles that lie deep in the abdomen. Lie on your back on a firm bed or the floor with your legs extended. Bend your knees so they are pointing toward the ceiling and your feet are flat on the floor. Tighten your lower abdominal muscles to press your lower back against the floor. This motion will tilt your pelvis so your tailbone points up toward the ceiling instead of pointing to your feet or the floor. To help with this exercise, you may place a small towel under your lower back and try to push your back into the towel. Hold this position for __________ seconds. Let your muscles relax completely before you repeat this exercise. Repeat __________ times. Complete this exercise __________ times a day. Alternating arm and leg raises  Get on your hands  and knees on a firm surface. If you are on a hard floor, you may want to use padding, such as an exercise mat, to cushion your knees. Line up your arms and legs. Your hands should be directly below your shoulders, and your knees should be directly below your hips. Lift your left leg behind you. At the same time, raise your right arm and straighten it in front of you. Do not lift your leg higher than your hip. Do not lift your arm higher  than your shoulder. Keep your abdominal and back muscles tight. Keep your hips facing the ground. Do not arch your back. Keep your balance carefully, and do not hold your breath. Hold this position for __________ seconds. Slowly return to the starting position. Repeat with your right leg and your left arm. Repeat __________ times. Complete this exercise __________ times a day. Abdominal set with straight leg raise  Lie on your back on a firm bed or the floor. Bend one of your knees and keep your other leg straight. Tense your abdominal muscles and lift your straight leg up, 4-6 inches (10-15 cm) off the ground. Keep your abdominal muscles tight and hold this position for __________ seconds. Do not hold your breath. Do not arch your back. Keep it flat against the ground. Keep your abdominal muscles tense as you slowly lower your leg back to the starting position. Repeat with your other leg. Repeat __________ times. Complete this exercise __________ times a day. Single leg lower with bent knees Lie on your back on a firm bed or the floor. Tense your abdominal muscles and lift your feet off the floor, one foot at a time, so your knees and hips are bent in 90-degree angles (right angles). Your knees should be over your hips and your lower legs should be parallel to the floor. Keeping your abdominal muscles tense and your knee bent, slowly lower one of your legs so your toe touches the ground. Lift your leg back up to return to the starting position. Do not hold your breath. Do not let your back arch. Keep your back flat against the ground. Repeat with your other leg. Repeat __________ times. Complete this exercise __________ times a day. Posture and body mechanics Good posture and healthy body mechanics can help to relieve stress in your body's tissues and joints. Body mechanics refers to the movements and positions of your body while you do your daily activities. Posture is part of body  mechanics. Good posture means: Your spine is in its natural S-curve position (neutral). Your shoulders are pulled back slightly. Your head is not tipped forward (neutral). Follow these guidelines to improve your posture and body mechanics in your everyday activities. Standing  When standing, keep your spine neutral and your feet about hip-width apart. Keep a slight bend in your knees. Your ears, shoulders, and hips should line up. When you do a task in which you stand in one place for a long time, place one foot up on a stable object that is 2-4 inches (5-10 cm) high, such as a footstool. This helps keep your spine neutral. Sitting  When sitting, keep your spine neutral and keep your feet flat on the floor. Use a footrest, if necessary, and keep your thighs parallel to the floor. Avoid rounding your shoulders, and avoid tilting your head forward. When working at a desk or a computer, keep your desk at a height where your hands are slightly lower than your elbows. Slide your  chair under your desk so you are close enough to maintain good posture. When working at a computer, place your monitor at a height where you are looking straight ahead and you do not have to tilt your head forward or downward to look at the screen. Resting When lying down and resting, avoid positions that are most painful for you. If you have pain with activities such as sitting, bending, stooping, or squatting, lie in a position in which your body does not bend very much. For example, avoid curling up on your side with your arms and knees near your chest (fetal position). If you have pain with activities such as standing for a long time or reaching with your arms, lie with your spine in a neutral position and bend your knees slightly. Try the following positions: Lying on your side with a pillow between your knees. Lying on your back with a pillow under your knees. Lifting  When lifting objects, keep your feet at least  shoulder-width apart and tighten your abdominal muscles. Bend your knees and hips and keep your spine neutral. It is important to lift using the strength of your legs, not your back. Do not lock your knees straight out. Always ask for help to lift heavy or awkward objects. This information is not intended to replace advice given to you by your health care provider. Make sure you discuss any questions you have with your health care provider. Document Revised: 08/27/2022 Document Reviewed: 07/11/2020 Elsevier Patient Education  2024 [employer].

## 2023-02-07 ENCOUNTER — Other Ambulatory Visit: Payer: Self-pay

## 2023-02-07 ENCOUNTER — Inpatient Hospital Stay: Payer: Medicare Other | Admitting: Medical Oncology

## 2023-02-07 ENCOUNTER — Encounter: Payer: Self-pay | Admitting: Medical Oncology

## 2023-02-07 ENCOUNTER — Inpatient Hospital Stay: Payer: Medicare Other | Attending: Hematology & Oncology

## 2023-02-07 VITALS — BP 149/64 | HR 80 | Temp 98.2°F | Resp 18 | Ht 63.0 in | Wt 212.0 lb

## 2023-02-07 DIAGNOSIS — D500 Iron deficiency anemia secondary to blood loss (chronic): Secondary | ICD-10-CM | POA: Insufficient documentation

## 2023-02-07 DIAGNOSIS — E538 Deficiency of other specified B group vitamins: Secondary | ICD-10-CM | POA: Diagnosis not present

## 2023-02-07 DIAGNOSIS — K922 Gastrointestinal hemorrhage, unspecified: Secondary | ICD-10-CM | POA: Insufficient documentation

## 2023-02-07 DIAGNOSIS — D509 Iron deficiency anemia, unspecified: Secondary | ICD-10-CM

## 2023-02-07 DIAGNOSIS — D649 Anemia, unspecified: Secondary | ICD-10-CM

## 2023-02-07 LAB — CMP (CANCER CENTER ONLY)
ALT: 20 U/L (ref 0–44)
AST: 15 U/L (ref 15–41)
Albumin: 4.4 g/dL (ref 3.5–5.0)
Alkaline Phosphatase: 58 U/L (ref 38–126)
Anion gap: 11 (ref 5–15)
BUN: 14 mg/dL (ref 8–23)
CO2: 30 mmol/L (ref 22–32)
Calcium: 9.6 mg/dL (ref 8.9–10.3)
Chloride: 99 mmol/L (ref 98–111)
Creatinine: 0.88 mg/dL (ref 0.44–1.00)
GFR, Estimated: >60 mL/min (ref >60)
Glucose, Bld: 163 mg/dL — ABNORMAL HIGH (ref 70–99)
Potassium: 3.7 mmol/L (ref 3.5–5.1)
Sodium: 140 mmol/L (ref 135–145)
Total Bilirubin: 0.4 mg/dL (ref 0.3–1.2)
Total Protein: 7 g/dL (ref 6.5–8.1)

## 2023-02-07 LAB — CBC WITH DIFFERENTIAL (CANCER CENTER ONLY)
Abs Immature Granulocytes: 0.02 10*3/uL (ref 0.00–0.07)
Basophils Absolute: 0 10*3/uL (ref 0.0–0.1)
Basophils Relative: 1 %
Eosinophils Absolute: 0.3 10*3/uL (ref 0.0–0.5)
Eosinophils Relative: 5 %
HCT: 39.2 % (ref 36.0–46.0)
Hemoglobin: 13.4 g/dL (ref 12.0–15.0)
Immature Granulocytes: 0 %
Lymphocytes Relative: 50 %
Lymphs Abs: 2.9 10*3/uL (ref 0.7–4.0)
MCH: 30 pg (ref 26.0–34.0)
MCHC: 34.2 g/dL (ref 30.0–36.0)
MCV: 87.9 fL (ref 80.0–100.0)
Monocytes Absolute: 0.4 10*3/uL (ref 0.1–1.0)
Monocytes Relative: 6 %
Neutro Abs: 2.3 10*3/uL (ref 1.7–7.7)
Neutrophils Relative %: 38 %
Platelet Count: 274 10*3/uL (ref 150–400)
RBC: 4.46 MIL/uL (ref 3.87–5.11)
RDW: 12.2 % (ref 11.5–15.5)
WBC Count: 5.9 10*3/uL (ref 4.0–10.5)
nRBC: 0 % (ref 0.0–0.2)

## 2023-02-07 LAB — IRON AND IRON BINDING CAPACITY (CC-WL,HP ONLY)
Iron: 63 ug/dL (ref 28–170)
Saturation Ratios: 15 % (ref 10.4–31.8)
TIBC: 409 ug/dL (ref 250–450)
UIBC: 346 ug/dL (ref 148–442)

## 2023-02-07 LAB — VITAMIN B12: Vitamin B-12: 858 pg/mL (ref 180–914)

## 2023-02-07 LAB — FERRITIN: Ferritin: 31 ng/mL (ref 11–307)

## 2023-02-07 NOTE — Progress Notes (Signed)
Hematology and Oncology Follow Up Visit  Susan [name] [id number] 1950-06-28 72 y.o. 02/07/2023  Past Medical History:  Diagnosis Date   Anxiety and depression    Atypical chest pain 2016   ruled out for MI, stress test showed no ischemia.  CT angio NO PE.   Cellulitis of chest wall 12/09/2015   s/p breast surgery   Colon cancer screening 05/11/2018   2007 colonoscopy normal.  Cologuard neg 05/11/18; repeat 3 yrs.   COPD (chronic obstructive pulmonary disease) (HCC)    mild; prn albut per Dr. Delton Coombes as of 2017--no f/u w/pulm since then.   DDD (degenerative disc disease)    Diabetes mellitus (HCC)    w/DPN   Diabetic peripheral neuropathy (HCC)    DJD (degenerative joint disease)    Fatty liver disease, nonalcoholic    Fibromyalgia    cymbalta   GERD (gastroesophageal reflux disease)    History of blood clots 2004   during cancer treatment   History of breast cancer 2004   DCIS->lumpectomy + rad. Eventually go bilat mastectomy in 2017 (Sister had breast cancer in her 53s). Last seen by onc not long after surgery, no further tx or onc f/u needed.   History of latent tuberculosis    got approp tx x 9 mo   History of palpitations 2016   sinus tachycardia.  30 day event monitor was normal (Dr. Eldridge Dace)   History of pneumonia    HTN (hypertension) 01/28/2012   Hyperlipidemia 01/28/2012   Hypothyroidism 02/14/2014   Lipoma 01/05/2018   Mediastinal adenopathy 2016   on CT angio done for atypical CP.  Has FH of NHL in sister. Pulm saw her 01/2016 and recommended a f/u CT 09/2016 but this doesn't appear to have been done.  Rpt CT 02/2019->resolved.   Nasal polyp 08/30/2012   Obesity    OSA on CPAP    HOme sleep study 09/02/19-->CPAP (Dr. Wynona Neat w/[facility])   Osteoarthritis    L knee-"end stage"--Dr. Veda Canning   Osteopenia    Peripheral vascular disease (HCC) 04   axillary,upper chest after 3 weeks on tamoxifen   Sialoadenitis of submandibular gland 08/27/2018   LEFT. doxy  rx'd by ED. ENT f/u the next day->reassured, dx'd with partial left submandibular gland obstruction w/out sign of infxn; f/u prn.   Tobacco abuse-unspec 10/12/2013    Principle Diagnosis:  Anemia  Current Therapy:   IV Iron- Venofer (06/18/2022, 06/25/2022, 07/02/2022, 07/13/2022, 07/20/2022)     Interim History:  Susan [name] is back for follow-up for her anemia.   Had an endoscopy through [facility] (March 2024) which was normal and had a camera study on April 17th. This study showed 3 places of the small intestine that likely were where she was bleeding. They have appeared to stop. According to patient the surgeon declined a repeat colonoscopy as she had just had one in June 2024. They have found that she has a fatty liver with some cirrhosis changes. She is getting established with a new GI specialist who specializes in the liver.   She denies any bleeding episodes and in general feels much better. Of note she is also taking oral B12. Not taking oral iron as it was ineffective.   There has been no bleeding to her knowledge: denies epistaxis, gingivitis, hemoptysis, hematemesis, hematuria, melena, excessive bruising, blood donation.   Has reduced her carbohydrate intake to help with her glucose values. She has had some secondary weight loss from this.    Wt Readings from  Last 3 Encounters:  02/07/23 212 lb (96.2 kg)  02/04/23 213 lb (96.6 kg)  08/07/22 214 lb 0.6 oz (97.1 kg)     Medications:   Current Outpatient Medications:    acetaminophen (TYLENOL) 500 MG tablet, Take 1 tablet by mouth as needed., Disp: , Rfl:    albuterol (PROVENTIL) (2.5 MG/3ML) 0.083% nebulizer solution, Take 3 mLs (2.5 mg total) by nebulization every 6 (six) hours as needed for wheezing or shortness of breath., Disp: 150 mL, Rfl: 1   albuterol (VENTOLIN HFA) 108 (90 Base) MCG/ACT inhaler, Inhale 1-2 puffs into the lungs every 4 (four) hours as needed for wheezing or shortness of breath., Disp: 1 each, Rfl: 0    amitriptyline (ELAVIL) 25 MG tablet, TAKE 1 TO 2 TABLETS BY  MOUTH AT BEDTIME, Disp: 180 tablet, Rfl: 3   BIOTIN PO, Take by mouth daily., Disp: , Rfl:    cholecalciferol (VITAMIN D) 1000 UNITS tablet, Take 1,000 Units by mouth daily., Disp: , Rfl:    clonazePAM (KLONOPIN) 0.5 MG tablet, Take 0.5 mg by mouth 2 (two) times daily as needed., Disp: , Rfl:    cyclobenzaprine (FLEXERIL) 10 MG tablet, TAKE 1 TABLET BY MOUTH THREE TIMES A DAY AS NEEDED FOR MUSCLE SPASMS, Disp: 60 tablet, Rfl: 1   diclofenac Sodium (VOLTAREN) 1 % GEL, Voltaren 1 % topical gel  APPLY 2 GRAM TO THE AFFECTED AREA(S) BY TOPICAL ROUTE 2 or 3 TIMES PER DAY, Disp: , Rfl:    dicyclomine (BENTYL) 10 MG capsule, Take 1 capsule by mouth as needed., Disp: , Rfl:    DULoxetine (CYMBALTA) 30 MG capsule, TAKE 3 CAPSULES BY MOUTH  DAILY, Disp: 270 capsule, Rfl: 0   esomeprazole (NEXIUM) 40 MG capsule, Take 40 mg by mouth daily at 12 noon., Disp: , Rfl:    hydrochlorothiazide (HYDRODIURIL) 25 MG tablet, TAKE 1 TABLET BY MOUTH  DAILY, Disp: 90 tablet, Rfl: 0   levothyroxine (SYNTHROID) 50 MCG tablet, TAKE 1 TABLET BY MOUTH  DAILY BEFORE BREAKFAST, Disp: 90 tablet, Rfl: 0   metFORMIN (GLUCOPHAGE) 1000 MG tablet, TAKE 1 TABLET BY MOUTH  TWICE DAILY WITH MEALS, Disp: 180 tablet, Rfl: 0   METHYLCOBALAMIN PO, Take 2,000 mcg by mouth daily., Disp: , Rfl:    metoprolol tartrate (LOPRESSOR) 100 MG tablet, TAKE 1 TABLET BY MOUTH  TWICE DAILY, Disp: 180 tablet, Rfl: 0   Multiple Vitamins-Minerals (MULTIVITAMIN WITH MINERALS) tablet, Take 1 tablet by mouth daily., Disp: , Rfl:    pravastatin (PRAVACHOL) 40 MG tablet, TAKE 1 TABLET BY MOUTH  DAILY, Disp: 90 tablet, Rfl: 1   PSYLLIUM HUSK PO, Take 5 tablets by mouth daily at 6 (six) AM., Disp: , Rfl:    Thiamine HCl (VITAMIN B-1 PO), Take by mouth daily., Disp: , Rfl:    traMADol (ULTRAM) 50 MG tablet, Take 50 mg by mouth as needed., Disp: , Rfl:   Allergies:  Allergies  Allergen Reactions    Chantix [Varenicline] Other (See Comments)    "Felt like having mental breakdown"   Ciprofloxacin Other (See Comments)    Muscle tightness, tendon aching and pain   Latex Itching   Morphine And Codeine Itching    MORPHINE DRIP - causes pt to feel irritable and itch   Penicillins Anaphylaxis and Swelling   Tamoxifen Other (See Comments)    Blood clots   Levaquin [Levofloxacin] Other (See Comments)    Severe tendon pain   Lyrica [Pregabalin] Swelling   Neurontin [Gabapentin] Swelling  Ceclor [Cefaclor] Rash   Other Other (See Comments)    Redness from tape and bandaides   Sulfa Antibiotics Rash   Tape Rash    Past Medical History, Surgical history, Social history, and Family History were reviewed and updated.  Review of Systems: Review of Systems  Constitutional:  Negative for appetite change, fatigue and unexpected weight change.  HENT:   Negative for mouth sores and nosebleeds.   Respiratory:  Negative for cough and shortness of breath.   Cardiovascular:  Negative for chest pain and leg swelling.  Gastrointestinal:  Negative for abdominal pain, blood in stool and constipation.  Endocrine: Negative for hot flashes.  Genitourinary:  Negative for hematuria.   Hematological:  Negative for adenopathy. Does not bruise/bleed easily.   Physical Exam:  height is 5\' 3"  (1.6 m) and weight is 212 lb (96.2 kg). Her oral temperature is 98.2 F (36.8 C). Her blood pressure is 149/64 (abnormal) and her pulse is 80. Her respiration is 18 and oxygen saturation is 100%.   Physical Exam Vitals and nursing note reviewed.  Constitutional:      General: She is not in acute distress.    Appearance: Normal appearance. She is not ill-appearing, toxic-appearing or diaphoretic.  HENT:     Head: Normocephalic and atraumatic.  Cardiovascular:     Rate and Rhythm: Normal rate and regular rhythm.     Pulses: Normal pulses.     Heart sounds: Normal heart sounds.  Pulmonary:     Effort: Pulmonary  effort is normal.     Breath sounds: Normal breath sounds.  Abdominal:     General: Bowel sounds are normal.     Palpations: Abdomen is soft.     Tenderness: There is no abdominal tenderness. There is no guarding.  Skin:    Coloration: Skin is not jaundiced.  Neurological:     Mental Status: She is alert.     Lab Results  Component Value Date   WBC 5.9 02/07/2023   HGB 13.4 02/07/2023   HCT 39.2 02/07/2023   MCV 87.9 02/07/2023   PLT 274 02/07/2023     Chemistry      Component Value Date/Time   NA 140 02/07/2023 1017   NA 141 04/09/2018 1545   NA 141 09/15/2015 1035   K 3.7 02/07/2023 1017   K 4.0 09/15/2015 1035   CL 99 02/07/2023 1017   CO2 30 02/07/2023 1017   CO2 30 (H) 09/15/2015 1035   BUN 14 02/07/2023 1017   BUN 12 04/09/2018 1545   BUN 15.5 09/15/2015 1035   CREATININE 0.88 02/07/2023 1017   CREATININE 0.91 01/25/2016 1011   CREATININE 0.9 09/15/2015 1035      Component Value Date/Time   CALCIUM 9.6 02/07/2023 1017   CALCIUM 9.8 09/15/2015 1035   ALKPHOS 58 02/07/2023 1017   ALKPHOS 60 09/15/2015 1035   AST 15 02/07/2023 1017   AST 35 (H) 09/15/2015 1035   ALT 20 02/07/2023 1017   ALT 53 09/15/2015 1035   BILITOT 0.4 02/07/2023 1017   BILITOT 0.31 09/15/2015 1035      Impression and Plan: 1. Iron deficiency anemia, unspecified iron deficiency anemia type   Susan [name] is a 72 y.o. female with IDA from GI bleed of small intestine. She does not have any known bleeding at this time. She is being treated with IV iron PRN and B12.   CBC normal. CMP shows normal kidney and liver function.  Iron studied pending. Will replenish if  needed.  Disposition: RTC 6 months APP, labs ( CBC, CMP, B12, iron, ferritin)   Clent Jacks PA-C 10/3/202411:09 AM

## 2023-02-26 ENCOUNTER — Ambulatory Visit: Payer: Medicare Other | Admitting: Rheumatology

## 2023-08-08 ENCOUNTER — Ambulatory Visit: Payer: Medicare Other | Admitting: Medical Oncology

## 2023-08-08 ENCOUNTER — Inpatient Hospital Stay: Payer: No Typology Code available for payment source

## 2023-08-15 ENCOUNTER — Encounter: Payer: Self-pay | Admitting: Medical Oncology

## 2023-08-15 ENCOUNTER — Inpatient Hospital Stay (HOSPITAL_BASED_OUTPATIENT_CLINIC_OR_DEPARTMENT_OTHER): Payer: Medicare Other | Admitting: Medical Oncology

## 2023-08-15 ENCOUNTER — Inpatient Hospital Stay: Payer: No Typology Code available for payment source | Attending: Medical Oncology

## 2023-08-15 VITALS — BP 133/53 | HR 73 | Temp 98.4°F | Resp 18 | Ht 63.0 in | Wt 204.0 lb

## 2023-08-15 DIAGNOSIS — K922 Gastrointestinal hemorrhage, unspecified: Secondary | ICD-10-CM | POA: Diagnosis not present

## 2023-08-15 DIAGNOSIS — E538 Deficiency of other specified B group vitamins: Secondary | ICD-10-CM | POA: Insufficient documentation

## 2023-08-15 DIAGNOSIS — D500 Iron deficiency anemia secondary to blood loss (chronic): Secondary | ICD-10-CM | POA: Insufficient documentation

## 2023-08-15 DIAGNOSIS — D509 Iron deficiency anemia, unspecified: Secondary | ICD-10-CM

## 2023-08-15 LAB — CMP (CANCER CENTER ONLY)
ALT: 16 U/L (ref 0–44)
AST: 15 U/L (ref 15–41)
Albumin: 4.5 g/dL (ref 3.5–5.0)
Alkaline Phosphatase: 65 U/L (ref 38–126)
Anion gap: 9 (ref 5–15)
BUN: 11 mg/dL (ref 8–23)
CO2: 30 mmol/L (ref 22–32)
Calcium: 9.6 mg/dL (ref 8.9–10.3)
Chloride: 98 mmol/L (ref 98–111)
Creatinine: 0.86 mg/dL (ref 0.44–1.00)
GFR, Estimated: >60 mL/min (ref >60)
Glucose, Bld: 103 mg/dL — ABNORMAL HIGH (ref 70–99)
Potassium: 3.5 mmol/L (ref 3.5–5.1)
Sodium: 137 mmol/L (ref 135–145)
Total Bilirubin: 0.5 mg/dL (ref 0.0–1.2)
Total Protein: 6.8 g/dL (ref 6.5–8.1)

## 2023-08-15 LAB — IRON AND IRON BINDING CAPACITY (CC-WL,HP ONLY)
Iron: 66 ug/dL (ref 28–170)
Saturation Ratios: 16 % (ref 10.4–31.8)
TIBC: 426 ug/dL (ref 250–450)
UIBC: 360 ug/dL (ref 148–442)

## 2023-08-15 LAB — CBC
HCT: 36.8 % (ref 36.0–46.0)
Hemoglobin: 12.4 g/dL (ref 12.0–15.0)
MCH: 28.4 pg (ref 26.0–34.0)
MCHC: 33.7 g/dL (ref 30.0–36.0)
MCV: 84.4 fL (ref 80.0–100.0)
Platelets: 295 10*3/uL (ref 150–400)
RBC: 4.36 MIL/uL (ref 3.87–5.11)
RDW: 12.2 % (ref 11.5–15.5)
WBC: 5.5 10*3/uL (ref 4.0–10.5)
nRBC: 0 % (ref 0.0–0.2)

## 2023-08-15 LAB — VITAMIN B12: Vitamin B-12: 1125 pg/mL — ABNORMAL HIGH (ref 180–914)

## 2023-08-15 LAB — FERRITIN: Ferritin: 32 ng/mL (ref 11–307)

## 2023-08-15 NOTE — Progress Notes (Signed)
 Hematology and Oncology Follow Up Visit  Susan [name] [id number] 04/08/1951 73 y.o. 08/15/2023  Past Medical History:  Diagnosis Date   Anxiety and depression    Atypical chest pain 2016   ruled out for MI, stress test showed no ischemia.  CT angio NO PE.   Cellulitis of chest wall 12/09/2015   s/p breast surgery   Colon cancer screening 05/11/2018   2007 colonoscopy normal.  Cologuard neg 05/11/18; repeat 3 yrs.   COPD (chronic obstructive pulmonary disease) (HCC)    mild; prn albut per Dr. Delton Coombes as of 2017--no f/u w/pulm since then.   DDD (degenerative disc disease)    Diabetes mellitus (HCC)    w/DPN   Diabetic peripheral neuropathy (HCC)    DJD (degenerative joint disease)    Fatty liver disease, nonalcoholic    Fibromyalgia    cymbalta   GERD (gastroesophageal reflux disease)    History of blood clots 2004   during cancer treatment   History of breast cancer 2004   DCIS->lumpectomy + rad. Eventually go bilat mastectomy in 2017 (Sister had breast cancer in her 109s). Last seen by onc not long after surgery, no further tx or onc f/u needed.   History of latent tuberculosis    got approp tx x 9 mo   History of palpitations 2016   sinus tachycardia.  30 day event monitor was normal (Dr. Eldridge Dace)   History of pneumonia    HTN (hypertension) 01/28/2012   Hyperlipidemia 01/28/2012   Hypothyroidism 02/14/2014   Lipoma 01/05/2018   Mediastinal adenopathy 2016   on CT angio done for atypical CP.  Has FH of NHL in sister. Pulm saw her 01/2016 and recommended a f/u CT 09/2016 but this doesn't appear to have been done.  Rpt CT 02/2019->resolved.   Nasal polyp 08/30/2012   Obesity    OSA on CPAP    HOme sleep study 09/02/19-->CPAP (Dr. Wynona Neat w/ pulm)   Osteoarthritis    L knee-"end stage"--Dr. Veda Canning   Osteopenia    Peripheral vascular disease (HCC) 04   axillary,upper chest after 3 weeks on tamoxifen   Sialoadenitis of submandibular gland 08/27/2018   LEFT. doxy  rx'd by ED. ENT f/u the next day->reassured, dx'd with partial left submandibular gland obstruction w/out sign of infxn; f/u prn.   Tobacco abuse-unspec 10/12/2013    Principle Diagnosis:  Anemia- Iron deficiency- GI bleed [facility]- Endoscopy- March 2024 [facility]- C[facility]- August 22, 2022 [facility]- Colonoscopy- June 2024  Current Therapy:   IV Iron- Venofer (06/18/2022, 06/25/2022, 07/02/2022, 07/13/2022, 07/20/2022) Oral B12- once daily      Interim History:  Susan [name] is back for follow-up for her anemia.   Had an endoscopy through [facility] (March 2024) which was normal and had a camera study on April 17th. This study showed 3 places of the small intestine that likely were where she was bleeding. They have appeared to stop. According to patient the surgeon declined a repeat colonoscopy as she had just had one in June 2024. They have found that she has a fatty liver with some cirrhosis changes. She is now followed by a liver specialist  Today she states that she has been well. She had a left total knee replacement in Jan 2025. She has a difficult recovery with pain but is doing much better. She quit smoking for her surgery.   She is also taking oral B12. Not taking oral iron as it was ineffective.   There has been no bleeding to  her knowledge: denies epistaxis, gingivitis, hemoptysis, hematemesis, hematuria, melena, excessive bruising, blood donation.   She is s/p bilateral mastectomy She is UTD on endoscopy/colonoscopy- [facility] but switched to [facility]  She is followed by GI for her liver- [facility] - July 10th She is UTD on her lung cancer screening - PCP She is due for a TBSE   Wt Readings from Last 3 Encounters:  08/15/23 204 lb (92.5 kg)  02/07/23 212 lb (96.2 kg)  02/04/23 213 lb (96.6 kg)     Medications:   Current Outpatient Medications:    acetaminophen (TYLENOL) 500 MG tablet, Take 1 tablet by mouth as needed., Disp: , Rfl:    BIOTIN PO, Take by mouth daily., Disp: , Rfl:     cholecalciferol (VITAMIN D) 1000 UNITS tablet, Take 1,000 Units by mouth daily., Disp: , Rfl:    clonazePAM (KLONOPIN) 0.5 MG tablet, Take 0.5 mg by mouth 2 (two) times daily as needed., Disp: , Rfl:    cyclobenzaprine (FLEXERIL) 10 MG tablet, TAKE 1 TABLET BY MOUTH THREE TIMES A DAY AS NEEDED FOR MUSCLE SPASMS, Disp: 60 tablet, Rfl: 1   diclofenac Sodium (VOLTAREN) 1 % GEL, Voltaren 1 % topical gel  APPLY 2 GRAM TO THE AFFECTED AREA(S) BY TOPICAL ROUTE 2 or 3 TIMES PER DAY, Disp: , Rfl:    dicyclomine (BENTYL) 10 MG capsule, Take 1 capsule by mouth as needed., Disp: , Rfl:    DULoxetine (CYMBALTA) 30 MG capsule, TAKE 3 CAPSULES BY MOUTH  DAILY, Disp: 270 capsule, Rfl: 0   esomeprazole (NEXIUM) 40 MG capsule, Take 40 mg by mouth daily at 12 noon., Disp: , Rfl:    hydrochlorothiazide (HYDRODIURIL) 25 MG tablet, TAKE 1 TABLET BY MOUTH  DAILY, Disp: 90 tablet, Rfl: 0   levothyroxine (SYNTHROID) 50 MCG tablet, TAKE 1 TABLET BY MOUTH  DAILY BEFORE BREAKFAST, Disp: 90 tablet, Rfl: 0   losartan (COZAAR) 25 MG tablet, Take 25 mg by mouth daily., Disp: , Rfl:    metFORMIN (GLUCOPHAGE) 1000 MG tablet, TAKE 1 TABLET BY MOUTH  TWICE DAILY WITH MEALS, Disp: 180 tablet, Rfl: 0   METHYLCOBALAMIN PO, Take 2,000 mcg by mouth daily., Disp: , Rfl:    metoprolol tartrate (LOPRESSOR) 100 MG tablet, TAKE 1 TABLET BY MOUTH  TWICE DAILY, Disp: 180 tablet, Rfl: 0   Multiple Vitamins-Minerals (MULTIVITAMIN WITH MINERALS) tablet, Take 1 tablet by mouth daily., Disp: , Rfl:    pravastatin (PRAVACHOL) 40 MG tablet, TAKE 1 TABLET BY MOUTH  DAILY, Disp: 90 tablet, Rfl: 1   PSYLLIUM HUSK PO, Take 5 tablets by mouth daily at 6 (six) AM., Disp: , Rfl:    Thiamine HCl (VITAMIN B-1 PO), Take by mouth daily., Disp: , Rfl:    traMADol (ULTRAM) 50 MG tablet, Take 50 mg by mouth as needed., Disp: , Rfl:   Allergies:  Allergies  Allergen Reactions   Chantix [Varenicline] Other (See Comments)    "Felt like having mental breakdown"    Ciprofloxacin Other (See Comments)    Muscle tightness, tendon aching and pain   Latex Itching   Lisinopril Other (See Comments)    Reports that anxiety and depression worsened with lisinopril, sleepless nights   Morphine And Codeine Itching    MORPHINE DRIP - causes pt to feel irritable and itch   Penicillins Anaphylaxis and Swelling   Tamoxifen Other (See Comments)    Blood clots   Levaquin [Levofloxacin] Other (See Comments)    Severe tendon pain   Lyrica [  Pregabalin] Swelling   Neurontin [Gabapentin] Swelling   Semaglutide Diarrhea   Ceclor [Cefaclor] Rash   Other Other (See Comments)    Redness from tape and bandaides   Sulfa Antibiotics Rash   Tape Rash    Past Medical History, Surgical history, Social history, and Family History were reviewed and updated.  Review of Systems: Review of Systems  Constitutional:  Negative for appetite change, fatigue and unexpected weight change.  HENT:   Negative for mouth sores and nosebleeds.   Respiratory:  Negative for cough and shortness of breath.   Cardiovascular:  Negative for chest pain and leg swelling.  Gastrointestinal:  Negative for abdominal pain, blood in stool and constipation.  Endocrine: Negative for hot flashes.  Genitourinary:  Negative for hematuria.   Hematological:  Negative for adenopathy. Does not bruise/bleed easily.   Physical Exam:  height is 5\' 3"  (1.6 m) and weight is 204 lb (92.5 kg). Her oral temperature is 98.4 F (36.9 C). Her blood pressure is 133/53 (abnormal) and her pulse is 73. Her respiration is 18 and oxygen saturation is 100%.   Physical Exam Vitals and nursing note reviewed.  Constitutional:      General: She is not in acute distress.    Appearance: Normal appearance. She is not ill-appearing, toxic-appearing or diaphoretic.  HENT:     Head: Normocephalic and atraumatic.  Cardiovascular:     Rate and Rhythm: Normal rate and regular rhythm.     Pulses: Normal pulses.     Heart sounds:  Normal heart sounds.  Pulmonary:     Effort: Pulmonary effort is normal.     Breath sounds: Normal breath sounds.  Abdominal:     General: Bowel sounds are normal.     Palpations: Abdomen is soft.     Tenderness: There is no abdominal tenderness. There is no guarding.  Skin:    Coloration: Skin is not jaundiced.  Neurological:     Mental Status: She is alert.     Lab Results  Component Value Date   WBC 5.5 08/15/2023   HGB 12.4 08/15/2023   HCT 36.8 08/15/2023   MCV 84.4 08/15/2023   PLT 295 08/15/2023     Chemistry      Component Value Date/Time   NA 137 08/15/2023 1008   NA 141 04/09/2018 1545   NA 141 09/15/2015 1035   K 3.5 08/15/2023 1008   K 4.0 09/15/2015 1035   CL 98 08/15/2023 1008   CO2 30 08/15/2023 1008   CO2 30 (H) 09/15/2015 1035   BUN 11 08/15/2023 1008   BUN 12 04/09/2018 1545   BUN 15.5 09/15/2015 1035   CREATININE 0.86 08/15/2023 1008   CREATININE 0.91 01/25/2016 1011   CREATININE 0.9 09/15/2015 1035      Component Value Date/Time   CALCIUM 9.6 08/15/2023 1008   CALCIUM 9.8 09/15/2015 1035   ALKPHOS 65 08/15/2023 1008   ALKPHOS 60 09/15/2015 1035   AST 15 08/15/2023 1008   AST 35 (H) 09/15/2015 1035   ALT 16 08/15/2023 1008   ALT 53 09/15/2015 1035   BILITOT 0.5 08/15/2023 1008   BILITOT 0.31 09/15/2015 1035     Encounter Diagnoses  Name Primary?   Iron deficiency anemia due to chronic blood loss Yes   B12 deficiency    Impression and Plan:  Susan [name] is a 73 y.o. female with IDA from GI bleed of small intestine. She does not have any known bleeding at this time. She is being  treated with IV iron PRN and B12.   Hgb today is 12.4. MCV is 84.4 Iron studied pending. Will replenish if needed. B12 pending.   Disposition: RTC 6 months APP, labs ( CBC, CMP, B12, iron, ferritin)   Clent Jacks PA-C 4/10/202510:58 AM

## 2023-08-20 ENCOUNTER — Other Ambulatory Visit: Payer: Self-pay | Admitting: Medical Oncology

## 2023-08-21 ENCOUNTER — Inpatient Hospital Stay

## 2023-08-21 VITALS — BP 126/54 | HR 70 | Temp 98.0°F | Resp 19

## 2023-08-21 DIAGNOSIS — D509 Iron deficiency anemia, unspecified: Secondary | ICD-10-CM

## 2023-08-21 DIAGNOSIS — D500 Iron deficiency anemia secondary to blood loss (chronic): Secondary | ICD-10-CM | POA: Diagnosis not present

## 2023-08-21 MED ORDER — SODIUM CHLORIDE 0.9 % IV SOLN: INTRAVENOUS | Status: DC

## 2023-08-21 MED ORDER — SODIUM CHLORIDE 0.9 % IV SOLN
300.0000 mg | Freq: Once | INTRAVENOUS | Status: AC
  Administered 2023-08-21: 300 mg via INTRAVENOUS
  Filled 2023-08-21: qty 300

## 2023-08-21 NOTE — Patient Instructions (Signed)

## 2023-08-28 ENCOUNTER — Inpatient Hospital Stay

## 2023-08-28 VITALS — BP 124/55 | HR 70 | Temp 98.0°F | Resp 20

## 2023-08-28 DIAGNOSIS — D509 Iron deficiency anemia, unspecified: Secondary | ICD-10-CM

## 2023-08-28 DIAGNOSIS — D500 Iron deficiency anemia secondary to blood loss (chronic): Secondary | ICD-10-CM | POA: Diagnosis not present

## 2023-08-28 MED ORDER — SODIUM CHLORIDE 0.9 % IV SOLN
300.0000 mg | Freq: Once | INTRAVENOUS | Status: AC
  Administered 2023-08-28: 300 mg via INTRAVENOUS
  Filled 2023-08-28: qty 300

## 2023-08-28 MED ORDER — SODIUM CHLORIDE 0.9 % IV SOLN: INTRAVENOUS | Status: DC

## 2023-08-28 NOTE — Patient Instructions (Signed)

## 2023-09-04 ENCOUNTER — Inpatient Hospital Stay

## 2023-09-04 VITALS — BP 121/42 | HR 65 | Temp 98.5°F | Resp 17

## 2023-09-04 DIAGNOSIS — D509 Iron deficiency anemia, unspecified: Secondary | ICD-10-CM

## 2023-09-04 DIAGNOSIS — D500 Iron deficiency anemia secondary to blood loss (chronic): Secondary | ICD-10-CM | POA: Diagnosis not present

## 2023-09-04 MED ORDER — SODIUM CHLORIDE 0.9 % IV SOLN: INTRAVENOUS | Status: DC

## 2023-09-04 MED ORDER — SODIUM CHLORIDE 0.9 % IV SOLN
300.0000 mg | Freq: Once | INTRAVENOUS | Status: AC
  Administered 2023-09-04: 300 mg via INTRAVENOUS
  Filled 2023-09-04: qty 300

## 2023-09-04 NOTE — Patient Instructions (Signed)

## 2023-09-05 ENCOUNTER — Other Ambulatory Visit: Payer: Self-pay

## 2023-09-05 ENCOUNTER — Emergency Department (HOSPITAL_COMMUNITY)

## 2023-09-05 ENCOUNTER — Emergency Department (HOSPITAL_COMMUNITY)
Admission: EM | Admit: 2023-09-05 | Discharge: 2023-09-05 | Disposition: A | Discharge status: Home / Self Care | Attending: Emergency Medicine | Admitting: Emergency Medicine

## 2023-09-05 ENCOUNTER — Encounter (HOSPITAL_COMMUNITY): Payer: Self-pay | Admitting: Emergency Medicine

## 2023-09-05 DIAGNOSIS — M25552 Pain in left hip: Secondary | ICD-10-CM | POA: Diagnosis present

## 2023-09-05 DIAGNOSIS — J449 Chronic obstructive pulmonary disease, unspecified: Secondary | ICD-10-CM | POA: Diagnosis not present

## 2023-09-05 DIAGNOSIS — E1140 Type 2 diabetes mellitus with diabetic neuropathy, unspecified: Secondary | ICD-10-CM | POA: Insufficient documentation

## 2023-09-05 DIAGNOSIS — I10 Essential (primary) hypertension: Secondary | ICD-10-CM | POA: Insufficient documentation

## 2023-09-05 DIAGNOSIS — R1032 Left lower quadrant pain: Secondary | ICD-10-CM | POA: Diagnosis not present

## 2023-09-05 DIAGNOSIS — Z853 Personal history of malignant neoplasm of breast: Secondary | ICD-10-CM | POA: Insufficient documentation

## 2023-09-05 DIAGNOSIS — Z720 Tobacco use: Secondary | ICD-10-CM | POA: Insufficient documentation

## 2023-09-05 DIAGNOSIS — E039 Hypothyroidism, unspecified: Secondary | ICD-10-CM | POA: Diagnosis not present

## 2023-09-05 DIAGNOSIS — Z7984 Long term (current) use of oral hypoglycemic drugs: Secondary | ICD-10-CM | POA: Insufficient documentation

## 2023-09-05 DIAGNOSIS — Z96652 Presence of left artificial knee joint: Secondary | ICD-10-CM | POA: Diagnosis not present

## 2023-09-05 DIAGNOSIS — Z79899 Other long term (current) drug therapy: Secondary | ICD-10-CM | POA: Insufficient documentation

## 2023-09-05 DIAGNOSIS — Z91040 Latex allergy status: Secondary | ICD-10-CM | POA: Diagnosis not present

## 2023-09-05 LAB — BASIC METABOLIC PANEL WITH GFR
Anion gap: 10 (ref 5–15)
BUN: 14 mg/dL (ref 8–23)
CO2: 27 mmol/L (ref 22–32)
Calcium: 9.3 mg/dL (ref 8.9–10.3)
Chloride: 97 mmol/L — ABNORMAL LOW (ref 98–111)
Creatinine, Ser: 0.89 mg/dL (ref 0.44–1.00)
GFR, Estimated: >60 mL/min (ref >60)
Glucose, Bld: 109 mg/dL — ABNORMAL HIGH (ref 70–99)
Potassium: 3.6 mmol/L (ref 3.5–5.1)
Sodium: 134 mmol/L — ABNORMAL LOW (ref 135–145)

## 2023-09-05 LAB — URINALYSIS, ROUTINE W REFLEX MICROSCOPIC
Bacteria, UA: NONE SEEN
Bilirubin Urine: NEGATIVE
Glucose, UA: NEGATIVE mg/dL
Hgb urine dipstick: NEGATIVE
Ketones, ur: NEGATIVE mg/dL
Leukocytes,Ua: NEGATIVE
Nitrite: NEGATIVE
Protein, ur: NEGATIVE mg/dL
Specific Gravity, Urine: 1.006 (ref 1.005–1.030)
pH: 7 (ref 5.0–8.0)

## 2023-09-05 LAB — CBC WITH DIFFERENTIAL/PLATELET
Abs Immature Granulocytes: 0.02 10*3/uL (ref 0.00–0.07)
Basophils Absolute: 0 10*3/uL (ref 0.0–0.1)
Basophils Relative: 0 %
Eosinophils Absolute: 0.4 10*3/uL (ref 0.0–0.5)
Eosinophils Relative: 5 %
HCT: 38.5 % (ref 36.0–46.0)
Hemoglobin: 12.5 g/dL (ref 12.0–15.0)
Immature Granulocytes: 0 %
Lymphocytes Relative: 45 %
Lymphs Abs: 3 10*3/uL (ref 0.7–4.0)
MCH: 28.6 pg (ref 26.0–34.0)
MCHC: 32.5 g/dL (ref 30.0–36.0)
MCV: 88.1 fL (ref 80.0–100.0)
Monocytes Absolute: 0.6 10*3/uL (ref 0.1–1.0)
Monocytes Relative: 9 %
Neutro Abs: 2.8 10*3/uL (ref 1.7–7.7)
Neutrophils Relative %: 41 %
Platelets: 231 10*3/uL (ref 150–400)
RBC: 4.37 MIL/uL (ref 3.87–5.11)
RDW: 12.6 % (ref 11.5–15.5)
WBC: 6.7 10*3/uL (ref 4.0–10.5)
nRBC: 0 % (ref 0.0–0.2)

## 2023-09-05 MED ORDER — KETOROLAC TROMETHAMINE 30 MG/ML IJ SOLN
30.0000 mg | Freq: Once | INTRAMUSCULAR | Status: AC
  Administered 2023-09-05: 30 mg via INTRAVENOUS
  Filled 2023-09-05: qty 1

## 2023-09-05 MED ORDER — ONDANSETRON HCL 4 MG/2ML IJ SOLN
4.0000 mg | Freq: Once | INTRAMUSCULAR | Status: AC
  Administered 2023-09-05: 4 mg via INTRAVENOUS
  Filled 2023-09-05: qty 2

## 2023-09-05 MED ORDER — FENTANYL CITRATE PF 50 MCG/ML IJ SOSY
50.0000 ug | PREFILLED_SYRINGE | Freq: Once | INTRAMUSCULAR | Status: AC
  Administered 2023-09-05: 50 ug via INTRAVENOUS
  Filled 2023-09-05: qty 1

## 2023-09-05 NOTE — ED Notes (Signed)
 Pt transport to CT

## 2023-09-05 NOTE — ED Notes (Signed)
 Called lab to inquire about UA, lab states they are running it now.

## 2023-09-05 NOTE — ED Triage Notes (Signed)
 Patient c/o hip pain x 1 week. Patient report she use a 50lbs leg pressed machine in the gym last Friday. Patient report worsening pain this morning.

## 2023-09-05 NOTE — Discharge Instructions (Addendum)
 You were seen today for hip and LLQ pain your workup is reassuring.  Follow-up with Dr. Genevive Ket.Aaron Aas

## 2023-09-05 NOTE — ED Provider Notes (Addendum)
 [place] EMERGENCY DEPARTMENT AT [facility] Provider Note   CSN: [id number] Arrival date & time: 09/05/23  0346     History  Chief Complaint  Patient presents with   Hip Pain    Susan [name] is a 73 y.o. female.  HPI     This is a 73 year old female who presents with concern for left lower quadrant left hip pain.  Patient reports she has had progressive worsening symptoms over the last 1 week.  She feels like she is having left hip pain.  However it is not necessarily worse with ambulation.  She did do some working out last Friday and is unsure whether this is related.  She had a left knee replacement in January.  Pain has become progressively worse.  It is constant in nature.  Not relieved with Aleve or tramadol.  Denies urinary symptoms.  Sometimes the pain radiates into the left thigh.  Home Medications Prior to Admission medications   Medication Sig Start Date End Date Taking? Authorizing Provider  acetaminophen  (TYLENOL ) 500 MG tablet Take 1 tablet by mouth as needed.    [provider]  BIOTIN PO Take by mouth daily.    [provider]  cholecalciferol  (VITAMIN D ) 1000 UNITS tablet Take 1,000 Units by mouth daily.    [provider]  clonazePAM (KLONOPIN) 0.5 MG tablet Take 0.5 mg by mouth 2 (two) times daily as needed. 09/19/21   [provider]  cyclobenzaprine  (FLEXERIL ) 10 MG tablet TAKE 1 TABLET BY MOUTH THREE TIMES A DAY AS NEEDED FOR MUSCLE SPASMS 01/17/21   McGowen, Minetta Aly, MD  diclofenac Sodium (VOLTAREN) 1 % GEL Voltaren 1 % topical gel  APPLY 2 GRAM TO THE AFFECTED AREA(S) BY TOPICAL ROUTE 2 or 3 TIMES PER DAY    [provider]  dicyclomine (BENTYL) 10 MG capsule Take 1 capsule by mouth as needed. 08/22/21   [provider]  DULoxetine  (CYMBALTA ) 30 MG capsule TAKE 3 CAPSULES BY MOUTH  DAILY 04/06/21   McGowen, Minetta Aly, MD  esomeprazole  (NEXIUM ) 40 MG capsule Take 40 mg by mouth daily at 12  noon.    [provider]  hydrochlorothiazide  (HYDRODIURIL ) 25 MG tablet TAKE 1 TABLET BY MOUTH  DAILY 04/13/21   McGowen, Minetta Aly, MD  levothyroxine  (SYNTHROID ) 50 MCG tablet TAKE 1 TABLET BY MOUTH  DAILY BEFORE BREAKFAST 04/13/21   McGowen, Minetta Aly, MD  losartan (COZAAR) 25 MG tablet Take 25 mg by mouth daily. 02/20/23   [provider]  metFORMIN  (GLUCOPHAGE ) 1000 MG tablet TAKE 1 TABLET BY MOUTH  TWICE DAILY WITH MEALS 04/13/21   McGowen, Minetta Aly, MD  METHYLCOBALAMIN PO Take 2,000 mcg by mouth daily.    [provider]  metoprolol  tartrate (LOPRESSOR ) 100 MG tablet TAKE 1 TABLET BY MOUTH  TWICE DAILY 05/04/21   McGowen, Minetta Aly, MD  Multiple Vitamins-Minerals (MULTIVITAMIN WITH MINERALS) tablet Take 1 tablet by mouth daily.    [provider]  pravastatin  (PRAVACHOL ) 40 MG tablet TAKE 1 TABLET BY MOUTH  DAILY 12/30/20   McGowen, Minetta Aly, MD  PSYLLIUM HUSK PO Take 5 tablets by mouth daily at 6 (six) AM.    [provider]  Thiamine HCl (VITAMIN B-1 PO) Take by mouth daily.    [provider]  traMADol (ULTRAM) 50 MG tablet Take 50 mg by mouth as needed.    [provider]      Allergies    Chantix [varenicline],  Ciprofloxacin, Latex, Lisinopril , Morphine  and codeine, Penicillins, Tamoxifen, Levaquin  [levofloxacin ], Lyrica [pregabalin], Neurontin [gabapentin], Semaglutide, Ceclor [cefaclor], Other, Sulfa antibiotics, and Tape    Review of Systems   Review of Systems  Constitutional:  Negative for fever.  Respiratory:  Negative for shortness of breath.   Genitourinary:  Negative for dysuria and hematuria.  All other systems reviewed and are negative.   Physical Exam Updated Vital Signs BP 139/67 (BP Location: Left Arm)   Pulse 62   Temp 98.5 F (36.9 C) (Oral)   Resp 16   SpO2 98%  Physical Exam Vitals and nursing note reviewed.  Constitutional:      Appearance: She is well-developed. She is obese. She is not  ill-appearing.  HENT:     Head: Normocephalic and atraumatic.  Eyes:     Pupils: Pupils are equal, round, and reactive to light.  Cardiovascular:     Rate and Rhythm: Normal rate and regular rhythm.     Heart sounds: Normal heart sounds.  Pulmonary:     Effort: Pulmonary effort is normal. No respiratory distress.     Breath sounds: No wheezing.  Abdominal:     General: Bowel sounds are normal.     Palpations: Abdomen is soft.     Tenderness: There is abdominal tenderness.     Comments: Left lower quadrant tenderness to palpation just above the left hip, no rebound or guarding  Musculoskeletal:     Cervical back: Neck supple.     Comments: Normal range of motion left hip  Skin:    General: Skin is warm and dry.  Neurological:     Mental Status: She is alert and oriented to person, place, and time.  Psychiatric:        Mood and Affect: Mood normal.     ED Results / Procedures / Treatments   Labs (all labs ordered are listed, but only abnormal results are displayed) Labs Reviewed  BASIC METABOLIC PANEL WITH GFR - Abnormal; Notable for the following components:      Result Value   Sodium 134 (*)    Chloride 97 (*)    Glucose, Bld 109 (*)    All other components within normal limits  CBC WITH DIFFERENTIAL/PLATELET  URINALYSIS, ROUTINE W REFLEX MICROSCOPIC    EKG None  Radiology DG Hip Unilat W or Wo Pelvis 2-3 Views Left Result Date: 09/05/2023 CLINICAL DATA:  Left hip pain. EXAM: DG HIP (WITH OR WITHOUT PELVIS) 2-3V LEFT COMPARISON:  AP pelvis 02/04/2023 FINDINGS: Three views including AP pelvis and AP and frog-leg left hip. There is no evidence of left hip fracture or dislocation. No pelvic fracture or diastasis is seen. Bone mineralization is normal. There is advanced degenerative change in the lower lumbar spine. There are mild degenerative features at both hips, without erosive arthropathy. Both SI joints, pubic symphysis are patent with mild spurring. Pelvic  phleboliths. There is a 1 cm calcification in the left lower abdomen superimposing over the medial left iliac crest, which is probably either a calcified lymph node or a calcified fecalith within a distal descending colon diverticulum. IMPRESSION: 1. No evidence of left hip fracture or dislocation. 2. Mild degenerative features at both hips. 3. Advanced degenerative change in the lower lumbar spine. 4. 1 cm calcification in the left lower abdomen. Electronically Signed   By: Denman Fischer M.D.   On: 09/05/2023 04:46    Procedures Procedures    Medications Ordered in ED Medications  ondansetron  (ZOFRAN ) injection 4 mg (  4 mg Intravenous Given 09/05/23 0540)  fentaNYL  (SUBLIMAZE ) injection 50 mcg (50 mcg Intravenous Given 09/05/23 0540)    ED Course/ Medical Decision Making/ A&P                                 Medical Decision Making Amount and/or Complexity of Data Reviewed Labs: ordered. Radiology: ordered.  Risk Prescription drug management.   This patient presents to the ED for concern of left lower quadrant, left hip pain, this involves an extensive number of treatment options, and is a complaint that carries with it a high risk of complications and morbidity.  I considered the following differential and admission for this acute, potentially life threatening condition.  The differential diagnosis includes left hip strain, labral tear, less likely fracture, given left lower quadrant discomfort, other considerations include kidney stone, UTI, ovarian pathology  MDM:    This is a 73 year old female who presents with left hip and left lower quadrant pain.  She is nontoxic and vital signs are reassuring.  She has reproducible tenderness really in the left lower quadrant.  She has normal rotation of the left hip and has not had any notable trauma.  X-rays of the left hip obtained and reviewed.  They show degeneration.  Of note, there is also calcification in the left lower quadrant.  This  makes me question whether she may have a large kidney stone that could be masquerading as hip pain.  Labs and urine obtained.  Will obtain CT imaging for better evaluation.  CT imaging without obvious stone or abnormality.  Urinalysis reassuring.  Will have patient follow-up with Dr. Genevive Ket.  (Labs, imaging, consults)  Labs: I Ordered, and personally interpreted labs.  The pertinent results include: CBC, BMP, urinalysis  Imaging Studies ordered: I ordered imaging studies including x-ray left hip, CT stone study I independently visualized and interpreted imaging. I agree with the radiologist interpretation  Additional history obtained from husband.  External records from outside source obtained and reviewed including prior evaluations  Cardiac Monitoring: The patient was not maintained on a cardiac monitor.  If on the cardiac monitor, I personally viewed and interpreted the cardiac monitored which showed an underlying rhythm of: N/A  Reevaluation: After the interventions noted above, I reevaluated the patient and found that they have :stayed the same  Social Determinants of Health:  lives independently  Disposition: D/C  Co morbidities that complicate the patient evaluation  Past Medical History:  Diagnosis Date   Anxiety and depression    Atypical chest pain 2016   ruled out for MI, stress test showed no ischemia.  CT angio NO PE.   Cellulitis of chest wall 12/09/2015   s/p breast surgery   Colon cancer screening 05/11/2018   2007 colonoscopy normal.  Cologuard neg 05/11/18; repeat 3 yrs.   COPD (chronic obstructive pulmonary disease) (HCC)    mild; prn albut per Dr. Baldwin Levee as of 2017--no f/u w/pulm since then.   DDD (degenerative disc disease)    Diabetes mellitus (HCC)    w/DPN   Diabetic peripheral neuropathy (HCC)    DJD (degenerative joint disease)    Fatty liver disease, nonalcoholic    Fibromyalgia    cymbalta    GERD (gastroesophageal reflux disease)    History of  blood clots 2004   during cancer treatment   History of breast cancer 2004   DCIS->lumpectomy + rad. Eventually go bilat mastectomy in 2017 (Sister  had breast cancer in her 87s). Last seen by onc not long after surgery, no further tx or onc f/u needed.   History of latent tuberculosis    got approp tx x 9 mo   History of palpitations 2016   sinus tachycardia.  30 day event monitor was normal (Dr. Jacquelynn Matter)   History of pneumonia    HTN (hypertension) 01/28/2012   Hyperlipidemia 01/28/2012   Hypothyroidism 02/14/2014   Lipoma 01/05/2018   Mediastinal adenopathy 2016   on CT angio done for atypical CP.  Has FH of NHL in sister. Pulm saw her 01/2016 and recommended a f/u CT 09/2016 but this doesn't appear to have been done.  Rpt CT 02/2019->resolved.   Nasal polyp 08/30/2012   Obesity    OSA on CPAP    HOme sleep study 09/02/19-->CPAP (Dr. Gaynell Keeler w/[facility])   Osteoarthritis    L knee-"end stage"--Dr. Carlye Child   Osteopenia    Peripheral vascular disease (HCC) 04   axillary,upper chest after 3 weeks on tamoxifen   Sialoadenitis of submandibular gland 08/27/2018   LEFT. doxy rx'd by ED. ENT f/u the next day->reassured, dx'd with partial left submandibular gland obstruction w/out sign of infxn; f/u prn.   Tobacco abuse-unspec 10/12/2013     Medicines Meds ordered this encounter  Medications   ondansetron  (ZOFRAN ) injection 4 mg   fentaNYL  (SUBLIMAZE ) injection 50 mcg    I have reviewed the patients home medicines and have made adjustments as needed  Problem List / ED Course: Problem List Items Addressed This Visit   None               Final Clinical Impression(s) / ED Diagnoses Final diagnoses:  None    Rx / DC Orders ED Discharge Orders     None         Marili Vader, Vonzella Guernsey, MD 09/05/23 1610    Rory Collard, MD 09/05/23 [phone]

## 2023-11-14 ENCOUNTER — Other Ambulatory Visit: Payer: Self-pay | Admitting: Nurse Practitioner

## 2023-11-14 DIAGNOSIS — K760 Fatty (change of) liver, not elsewhere classified: Secondary | ICD-10-CM

## 2023-11-14 DIAGNOSIS — K7400 Hepatic fibrosis, unspecified: Secondary | ICD-10-CM

## 2023-11-15 ENCOUNTER — Telehealth: Payer: Self-pay | Admitting: *Deleted

## 2023-11-15 NOTE — Telephone Encounter (Signed)
 Message received from patient stating that she has increased fatigue and would like to come in sooner than October for her lab work and to see Lauraine Dais PA.  Call placed back to patient and message left to notify her that a scheduler would be calling her to move up appts in October to next week and to call office back with any other questions or concerns. Message sent to scheduling.

## 2023-11-18 ENCOUNTER — Ambulatory Visit
Admission: RE | Admit: 2023-11-18 | Discharge: 2023-11-18 | Disposition: A | Discharge status: Home / Self Care | Source: Ambulatory Visit | Attending: Nurse Practitioner | Admitting: Nurse Practitioner

## 2023-11-18 DIAGNOSIS — K760 Fatty (change of) liver, not elsewhere classified: Secondary | ICD-10-CM

## 2023-11-18 DIAGNOSIS — K7400 Hepatic fibrosis, unspecified: Secondary | ICD-10-CM

## 2023-11-26 ENCOUNTER — Encounter: Payer: Self-pay | Admitting: Medical Oncology

## 2023-11-26 ENCOUNTER — Inpatient Hospital Stay: Admitting: Medical Oncology

## 2023-11-26 ENCOUNTER — Inpatient Hospital Stay: Attending: Hematology & Oncology

## 2023-11-26 VITALS — BP 130/38 | HR 58 | Temp 98.4°F | Resp 18 | Ht 63.0 in | Wt 199.0 lb

## 2023-11-26 DIAGNOSIS — E538 Deficiency of other specified B group vitamins: Secondary | ICD-10-CM

## 2023-11-26 DIAGNOSIS — D500 Iron deficiency anemia secondary to blood loss (chronic): Secondary | ICD-10-CM | POA: Diagnosis present

## 2023-11-26 DIAGNOSIS — K922 Gastrointestinal hemorrhage, unspecified: Secondary | ICD-10-CM | POA: Insufficient documentation

## 2023-11-26 LAB — IRON AND IRON BINDING CAPACITY (CC-WL,HP ONLY)
Iron: 88 ug/dL (ref 28–170)
Saturation Ratios: 22 % (ref 10.4–31.8)
TIBC: 400 ug/dL (ref 250–450)
UIBC: 312 ug/dL

## 2023-11-26 LAB — CBC WITH DIFFERENTIAL (CANCER CENTER ONLY)
Abs Immature Granulocytes: 0.02 K/uL (ref 0.00–0.07)
Basophils Absolute: 0 K/uL (ref 0.0–0.1)
Basophils Relative: 1 %
Eosinophils Absolute: 0.3 K/uL (ref 0.0–0.5)
Eosinophils Relative: 5 %
HCT: 36.8 % (ref 36.0–46.0)
Hemoglobin: 12.8 g/dL (ref 12.0–15.0)
Immature Granulocytes: 0 %
Lymphocytes Relative: 43 %
Lymphs Abs: 2.7 K/uL (ref 0.7–4.0)
MCH: 29.1 pg (ref 26.0–34.0)
MCHC: 34.8 g/dL (ref 30.0–36.0)
MCV: 83.6 fL (ref 80.0–100.0)
Monocytes Absolute: 0.5 K/uL (ref 0.1–1.0)
Monocytes Relative: 7 %
Neutro Abs: 2.8 K/uL (ref 1.7–7.7)
Neutrophils Relative %: 44 %
Platelet Count: 291 K/uL (ref 150–400)
RBC: 4.4 MIL/uL (ref 3.87–5.11)
RDW: 12.3 % (ref 11.5–15.5)
WBC Count: 6.3 K/uL (ref 4.0–10.5)
nRBC: 0 % (ref 0.0–0.2)

## 2023-11-26 LAB — CMP (CANCER CENTER ONLY)
ALT: 20 U/L (ref 0–44)
AST: 20 U/L (ref 15–41)
Albumin: 4.5 g/dL (ref 3.5–5.0)
Alkaline Phosphatase: 59 U/L (ref 38–126)
Anion gap: 13 (ref 5–15)
BUN: 18 mg/dL (ref 8–23)
CO2: 28 mmol/L (ref 22–32)
Calcium: 9.9 mg/dL (ref 8.9–10.3)
Chloride: 97 mmol/L — ABNORMAL LOW (ref 98–111)
Creatinine: 0.85 mg/dL (ref 0.44–1.00)
GFR, Estimated: >60 mL/min (ref >60)
Glucose, Bld: 103 mg/dL — ABNORMAL HIGH (ref 70–99)
Potassium: 4.9 mmol/L (ref 3.5–5.1)
Sodium: 138 mmol/L (ref 135–145)
Total Bilirubin: 0.4 mg/dL (ref 0.0–1.2)
Total Protein: 7 g/dL (ref 6.5–8.1)

## 2023-11-26 LAB — FERRITIN: Ferritin: 178 ng/mL (ref 11–307)

## 2023-11-26 LAB — VITAMIN B12: Vitamin B-12: 913 pg/mL (ref 180–914)

## 2023-11-26 NOTE — Progress Notes (Signed)
 Hematology and Oncology Follow Up Visit  AME HEAGLE [id number] 26-Jul-1950 73 y.o. 11/26/2023  Past Medical History:  Diagnosis Date   Anxiety and depression    Atypical chest pain 2016   ruled out for MI, stress test showed no ischemia.  CT angio NO PE.   Cellulitis of chest wall 12/09/2015   s/p breast surgery   Colon cancer screening 05/11/2018   2007 colonoscopy normal.  Cologuard neg 05/11/18; repeat 3 yrs.   COPD (chronic obstructive pulmonary disease) (HCC)    mild; prn albut per Dr. Shelah as of 2017--no f/u w/pulm since then.   DDD (degenerative disc disease)    Diabetes mellitus (HCC)    w/DPN   Diabetic peripheral neuropathy (HCC)    DJD (degenerative joint disease)    Fatty liver disease, nonalcoholic    Fibromyalgia    cymbalta    GERD (gastroesophageal reflux disease)    History of blood clots 2004   during cancer treatment   History of breast cancer 2004   DCIS->lumpectomy + rad. Eventually go bilat mastectomy in 2017 (Sister had breast cancer in her 69s). Last seen by onc not long after surgery, no further tx or onc f/u needed.   History of latent tuberculosis    got approp tx x 9 mo   History of palpitations 2016   sinus tachycardia.  30 day event monitor was normal (Dr. Dann)   History of pneumonia    HTN (hypertension) 01/28/2012   Hyperlipidemia 01/28/2012   Hypothyroidism 02/14/2014   Lipoma 01/05/2018   Mediastinal adenopathy 2016   on CT angio done for atypical CP.  Has FH of NHL in sister. Pulm saw her 01/2016 and recommended a f/u CT 09/2016 but this doesn't appear to have been done.  Rpt CT 02/2019->resolved.   Nasal polyp 08/30/2012   Obesity    OSA on CPAP    HOme sleep study 09/02/19-->CPAP (Dr. Neda w/[facility])   Osteoarthritis    L knee-end stage--Dr. Cherylene   Osteopenia    Peripheral vascular disease (HCC) 04   axillary,upper chest after 3 weeks on tamoxifen   Sialoadenitis of submandibular gland 08/27/2018   LEFT. doxy  rx'd by ED. ENT f/u the next day->reassured, dx'd with partial left submandibular gland obstruction w/out sign of infxn; f/u prn.   Tobacco abuse-unspec 10/12/2013    Principle Diagnosis:  Anemia- Iron  deficiency- GI bleed [facility]- Endoscopy- March 2024 [facility]- Capsule Study- August 22, 2022 [facility]- Colonoscopy- June 2024  Current Therapy:   IV Iron - Venofer  (06/18/2022, 06/25/2022, 07/02/2022, 07/13/2022, 07/20/2022) Oral B12- every other day.  Previous Therapy: Oral Iron - ineffective      Interim History:  Ms. Corredor is back for follow-up for her anemia.   Had an endoscopy through [facility] (March 2024) which was normal and had a camera study on April 17th. This study showed 3 places of the small intestine that likely were where she was bleeding. They have appeared to stop. According to patient the surgeon declined a repeat colonoscopy as she had just had one in June 2024. They have found that she has a fatty liver with some cirrhosis changes. She is now followed by a liver specialist  Today she states that she has been ok overall. She had a period of time where she felt weak and was having muscle tremors. She was found to have low magnesium  and was started on supplementation. She is feeling much better.    She is taking oral B12.   There has  been no bleeding to her knowledge: denies epistaxis, gingivitis, hemoptysis, hematemesis, hematuria, melena, excessive bruising, blood donation.   She is s/p bilateral mastectomy She is UTD on endoscopy/colonoscopy- [facility] but switched to [facility]  She is followed by GI for her liver- [facility]  She is UTD on her lung cancer screening - PCP She is due for a TBSE   Wt Readings from Last 3 Encounters:  11/26/23 199 lb (90.3 kg)  08/15/23 204 lb (92.5 kg)  02/07/23 212 lb (96.2 kg)     Medications:   Current Outpatient Medications:    acetaminophen  (TYLENOL ) 500 MG tablet, Take 1 tablet by mouth as needed., Disp: , Rfl:    cholecalciferol  (VITAMIN  D) 1000 UNITS tablet, Take 1,000 Units by mouth daily., Disp: , Rfl:    cyclobenzaprine  (FLEXERIL ) 10 MG tablet, TAKE 1 TABLET BY MOUTH THREE TIMES A DAY AS NEEDED FOR MUSCLE SPASMS, Disp: 60 tablet, Rfl: 1   diclofenac Sodium (VOLTAREN) 1 % GEL, Voltaren 1 % topical gel  APPLY 2 GRAM TO THE AFFECTED AREA(S) BY TOPICAL ROUTE 2 or 3 TIMES PER DAY, Disp: , Rfl:    dicyclomine (BENTYL) 10 MG capsule, Take 1 capsule by mouth as needed., Disp: , Rfl:    DULoxetine  (CYMBALTA ) 30 MG capsule, TAKE 3 CAPSULES BY MOUTH  DAILY, Disp: 270 capsule, Rfl: 0   esomeprazole  (NEXIUM ) 40 MG capsule, Take 40 mg by mouth daily at 12 noon., Disp: , Rfl:    hydrochlorothiazide  (HYDRODIURIL ) 25 MG tablet, TAKE 1 TABLET BY MOUTH  DAILY, Disp: 90 tablet, Rfl: 0   levothyroxine  (SYNTHROID ) 50 MCG tablet, TAKE 1 TABLET BY MOUTH  DAILY BEFORE BREAKFAST, Disp: 90 tablet, Rfl: 0   losartan (COZAAR) 25 MG tablet, Take 25 mg by mouth daily., Disp: , Rfl:    magnesium  oxide (MAG-OX) 400 (240 Mg) MG tablet, Take 400 mg by mouth 2 (two) times daily., Disp: , Rfl:    metFORMIN  (GLUCOPHAGE ) 1000 MG tablet, TAKE 1 TABLET BY MOUTH  TWICE DAILY WITH MEALS, Disp: 180 tablet, Rfl: 0   METHYLCOBALAMIN PO, Take 2,000 mcg by mouth daily., Disp: , Rfl:    metoprolol  tartrate (LOPRESSOR ) 100 MG tablet, TAKE 1 TABLET BY MOUTH  TWICE DAILY, Disp: 180 tablet, Rfl: 0   Multiple Vitamins-Minerals (MULTIVITAMIN WITH MINERALS) tablet, Take 1 tablet by mouth daily., Disp: , Rfl:    pravastatin  (PRAVACHOL ) 40 MG tablet, TAKE 1 TABLET BY MOUTH  DAILY, Disp: 90 tablet, Rfl: 1   PSYLLIUM HUSK PO, Take 5 tablets by mouth daily at 6 (six) AM., Disp: , Rfl:    Thiamine HCl (VITAMIN B-1 PO), Take by mouth daily., Disp: , Rfl:    traMADol HCl 100 MG TABS, Take 100 mg by mouth daily as needed., Disp: , Rfl:   Allergies:  Allergies  Allergen Reactions   Chantix [Varenicline] Other (See Comments)    Felt like having mental breakdown   Ciprofloxacin Other  (See Comments)    Muscle tightness, tendon aching and pain   Latex Itching   Lisinopril  Other (See Comments)    Reports that anxiety and depression worsened with lisinopril , sleepless nights   Morphine  And Codeine Itching    MORPHINE  DRIP - causes pt to feel irritable and itch   Penicillins Anaphylaxis and Swelling   Tamoxifen Other (See Comments)    Blood clots   Levaquin  [Levofloxacin ] Other (See Comments)    Severe tendon pain   Lyrica [Pregabalin] Swelling   Neurontin [Gabapentin] Swelling  Semaglutide Diarrhea   Ceclor [Cefaclor] Rash   Other Other (See Comments)    Redness from tape and bandaides   Sulfa Antibiotics Rash   Tape Rash    Past Medical History, Surgical history, Social history, and Family History were reviewed and updated.  Review of Systems: Review of Systems  Constitutional:  Negative for appetite change, fatigue and unexpected weight change.  HENT:   Negative for mouth sores and nosebleeds.   Respiratory:  Negative for cough and shortness of breath.   Cardiovascular:  Negative for chest pain and leg swelling.  Gastrointestinal:  Negative for abdominal pain, blood in stool and constipation.  Endocrine: Negative for hot flashes.  Genitourinary:  Negative for hematuria.   Hematological:  Negative for adenopathy. Does not bruise/bleed easily.   Physical Exam:  height is 5' 3 (1.6 m) and weight is 199 lb (90.3 kg). Her oral temperature is 98.4 F (36.9 C). Her blood pressure is 130/38 (abnormal) and her pulse is 58 (abnormal). Her respiration is 18 and oxygen saturation is 100%.   Physical Exam Vitals and nursing note reviewed.  Constitutional:      General: She is not in acute distress.    Appearance: Normal appearance. She is not ill-appearing, toxic-appearing or diaphoretic.  HENT:     Head: Normocephalic and atraumatic.  Cardiovascular:     Rate and Rhythm: Normal rate and regular rhythm.     Pulses: Normal pulses.     Heart sounds: Normal heart  sounds.  Pulmonary:     Effort: Pulmonary effort is normal.     Breath sounds: Normal breath sounds.  Abdominal:     General: Bowel sounds are normal.     Palpations: Abdomen is soft.     Tenderness: There is no abdominal tenderness. There is no guarding.  Skin:    Coloration: Skin is not jaundiced.  Neurological:     Mental Status: She is alert.     Lab Results  Component Value Date   WBC 6.3 11/26/2023   HGB 12.8 11/26/2023   HCT 36.8 11/26/2023   MCV 83.6 11/26/2023   PLT 291 11/26/2023     Chemistry      Component Value Date/Time   NA 138 11/26/2023 1113   NA 141 04/09/2018 1545   NA 141 09/15/2015 1035   K 4.9 11/26/2023 1113   K 4.0 09/15/2015 1035   CL 97 (L) 11/26/2023 1113   CO2 28 11/26/2023 1113   CO2 30 (H) 09/15/2015 1035   BUN 18 11/26/2023 1113   BUN 12 04/09/2018 1545   BUN 15.5 09/15/2015 1035   CREATININE 0.85 11/26/2023 1113   CREATININE 0.91 01/25/2016 1011   CREATININE 0.9 09/15/2015 1035      Component Value Date/Time   CALCIUM  9.9 11/26/2023 1113   CALCIUM  9.8 09/15/2015 1035   ALKPHOS 59 11/26/2023 1113   ALKPHOS 60 09/15/2015 1035   AST 20 11/26/2023 1113   AST 35 (H) 09/15/2015 1035   ALT 20 11/26/2023 1113   ALT 53 09/15/2015 1035   BILITOT 0.4 11/26/2023 1113   BILITOT 0.31 09/15/2015 1035     Encounter Diagnoses  Name Primary?   Iron  deficiency anemia due to chronic blood loss Yes   B12 deficiency     Impression and Plan:  Ms. Collings is a 73 y.o. female with IDA from GI bleed of small intestine. She does not have any known bleeding at this time. She is being treated with IV iron  PRN and  B12.   Hgb today is 12.8. MCV is 83.6 CMP is benign appearing  Iron  studied pending. Will replenish if needed. B12 pending.   Disposition: RTC 6 months APP, labs ( CBC, CMP, B12, iron , ferritin)   Lauraine Dais PA-C 7/22/202511:51 AM

## 2023-11-28 ENCOUNTER — Ambulatory Visit: Payer: Self-pay | Admitting: Medical Oncology

## 2024-02-13 ENCOUNTER — Inpatient Hospital Stay

## 2024-02-13 ENCOUNTER — Ambulatory Visit: Admitting: Medical Oncology

## 2024-05-28 ENCOUNTER — Inpatient Hospital Stay

## 2024-05-28 ENCOUNTER — Ambulatory Visit: Admitting: Medical Oncology

## 2024-06-02 ENCOUNTER — Ambulatory Visit: Admitting: Medical Oncology

## 2024-06-02 ENCOUNTER — Encounter: Payer: Self-pay | Admitting: Hematology & Oncology

## 2024-06-02 ENCOUNTER — Inpatient Hospital Stay

## 2024-06-08 ENCOUNTER — Encounter: Payer: Self-pay | Admitting: Hematology & Oncology

## 2024-06-15 ENCOUNTER — Inpatient Hospital Stay

## 2024-06-15 ENCOUNTER — Inpatient Hospital Stay: Admitting: Medical Oncology

## 2024-07-22 ENCOUNTER — Ambulatory Visit: Admitting: Student
